# Patient Record
Sex: Female | Born: 1965 | Race: Black or African American | Hispanic: No | Marital: Married | Smoking: Never smoker
Health system: Southern US, Community
[De-identification: ages and names within clinical notes are randomized; demographics above are authoritative.]

## PROBLEM LIST (undated history)

## (undated) DIAGNOSIS — Z87442 Personal history of urinary calculi: Secondary | ICD-10-CM

## (undated) DIAGNOSIS — R4589 Other symptoms and signs involving emotional state: Secondary | ICD-10-CM

## (undated) DIAGNOSIS — Z7189 Other specified counseling: Secondary | ICD-10-CM

## (undated) DIAGNOSIS — G5603 Carpal tunnel syndrome, bilateral upper limbs: Secondary | ICD-10-CM

## (undated) DIAGNOSIS — Z780 Asymptomatic menopausal state: Secondary | ICD-10-CM

## (undated) DIAGNOSIS — E669 Obesity, unspecified: Secondary | ICD-10-CM

## (undated) DIAGNOSIS — M545 Low back pain, unspecified: Secondary | ICD-10-CM

## (undated) DIAGNOSIS — G8929 Other chronic pain: Secondary | ICD-10-CM

## (undated) DIAGNOSIS — K429 Umbilical hernia without obstruction or gangrene: Secondary | ICD-10-CM

## (undated) DIAGNOSIS — F418 Other specified anxiety disorders: Secondary | ICD-10-CM

## (undated) DIAGNOSIS — R232 Flushing: Secondary | ICD-10-CM

## (undated) DIAGNOSIS — I10 Essential (primary) hypertension: Secondary | ICD-10-CM

## (undated) DIAGNOSIS — IMO0002 Reserved for concepts with insufficient information to code with codable children: Secondary | ICD-10-CM

## (undated) HISTORY — DX: Carpal tunnel syndrome, bilateral upper limbs: G56.03

## (undated) HISTORY — DX: Other symptoms and signs involving emotional state: R45.89

## (undated) HISTORY — DX: Reserved for concepts with insufficient information to code with codable children: IMO0002

## (undated) HISTORY — DX: Low back pain: M54.5

## (undated) HISTORY — PX: OTHER SURGICAL HISTORY: SHX169

## (undated) HISTORY — DX: Flushing: R23.2

## (undated) HISTORY — DX: Other specified anxiety disorders: F41.8

## (undated) HISTORY — PX: LUMBAR SPINE SURGERY: SHX701

## (undated) HISTORY — DX: Asymptomatic menopausal state: Z78.0

## (undated) HISTORY — DX: Other specified counseling: Z71.89

## (undated) HISTORY — PX: PARTIAL HYSTERECTOMY: SHX80

## (undated) HISTORY — DX: Umbilical hernia without obstruction or gangrene: K42.9

## (undated) HISTORY — DX: Obesity, unspecified: E66.9

## (undated) HISTORY — DX: Low back pain, unspecified: M54.50

## (undated) HISTORY — PX: ABDOMINAL HYSTERECTOMY: SHX81

## (undated) HISTORY — PX: KNEE ARTHROSCOPY: SUR90

## (undated) HISTORY — PX: BACK SURGERY: SHX140

## (undated) HISTORY — DX: Other chronic pain: G89.29

---

## 2001-01-04 ENCOUNTER — Emergency Department (HOSPITAL_COMMUNITY): Admission: EM | Admit: 2001-01-04 | Discharge: 2001-01-04 | Payer: Self-pay | Admitting: Internal Medicine

## 2001-01-04 ENCOUNTER — Encounter: Payer: Self-pay | Admitting: Internal Medicine

## 2001-03-11 ENCOUNTER — Ambulatory Visit (HOSPITAL_COMMUNITY): Admission: RE | Admit: 2001-03-11 | Discharge: 2001-03-11 | Payer: Self-pay | Admitting: Internal Medicine

## 2001-03-11 ENCOUNTER — Encounter: Payer: Self-pay | Admitting: Internal Medicine

## 2001-04-16 ENCOUNTER — Emergency Department (HOSPITAL_COMMUNITY): Admission: EM | Admit: 2001-04-16 | Discharge: 2001-04-16 | Payer: Self-pay | Admitting: Internal Medicine

## 2001-04-16 ENCOUNTER — Encounter: Payer: Self-pay | Admitting: Internal Medicine

## 2001-04-18 ENCOUNTER — Other Ambulatory Visit: Admission: RE | Admit: 2001-04-18 | Discharge: 2001-04-18 | Payer: Self-pay | Admitting: Obstetrics and Gynecology

## 2001-05-01 ENCOUNTER — Inpatient Hospital Stay (HOSPITAL_COMMUNITY): Admission: RE | Admit: 2001-05-01 | Discharge: 2001-05-03 | Payer: Self-pay | Admitting: Obstetrics & Gynecology

## 2002-06-07 ENCOUNTER — Emergency Department (HOSPITAL_COMMUNITY): Admission: EM | Admit: 2002-06-07 | Discharge: 2002-06-07 | Payer: Self-pay | Admitting: Emergency Medicine

## 2002-06-07 ENCOUNTER — Encounter: Payer: Self-pay | Admitting: Emergency Medicine

## 2002-06-08 ENCOUNTER — Ambulatory Visit (HOSPITAL_COMMUNITY): Admission: RE | Admit: 2002-06-08 | Discharge: 2002-06-08 | Payer: Self-pay | Admitting: Internal Medicine

## 2002-06-08 ENCOUNTER — Encounter: Payer: Self-pay | Admitting: Internal Medicine

## 2002-09-08 ENCOUNTER — Encounter: Payer: Self-pay | Admitting: *Deleted

## 2002-09-08 ENCOUNTER — Emergency Department (HOSPITAL_COMMUNITY): Admission: EM | Admit: 2002-09-08 | Discharge: 2002-09-08 | Payer: Self-pay | Admitting: *Deleted

## 2004-07-15 ENCOUNTER — Emergency Department (HOSPITAL_COMMUNITY): Admission: EM | Admit: 2004-07-15 | Discharge: 2004-07-15 | Payer: Self-pay | Admitting: Emergency Medicine

## 2005-02-01 ENCOUNTER — Emergency Department (HOSPITAL_COMMUNITY): Admission: EM | Admit: 2005-02-01 | Discharge: 2005-02-01 | Payer: Self-pay | Admitting: Emergency Medicine

## 2005-03-19 ENCOUNTER — Ambulatory Visit: Payer: Self-pay | Admitting: Orthopedic Surgery

## 2005-05-11 ENCOUNTER — Emergency Department (HOSPITAL_COMMUNITY): Admission: EM | Admit: 2005-05-11 | Discharge: 2005-05-11 | Payer: Self-pay | Admitting: *Deleted

## 2005-05-14 ENCOUNTER — Ambulatory Visit: Payer: Self-pay | Admitting: Orthopedic Surgery

## 2005-05-18 ENCOUNTER — Ambulatory Visit (HOSPITAL_COMMUNITY): Admission: RE | Admit: 2005-05-18 | Discharge: 2005-05-18 | Payer: Self-pay | Admitting: Orthopedic Surgery

## 2005-06-11 ENCOUNTER — Ambulatory Visit: Payer: Self-pay | Admitting: Orthopedic Surgery

## 2005-07-30 ENCOUNTER — Emergency Department (HOSPITAL_COMMUNITY): Admission: EM | Admit: 2005-07-30 | Discharge: 2005-07-31 | Payer: Self-pay | Admitting: Emergency Medicine

## 2005-07-31 ENCOUNTER — Ambulatory Visit (HOSPITAL_COMMUNITY): Admission: RE | Admit: 2005-07-31 | Discharge: 2005-07-31 | Payer: Self-pay | Admitting: Emergency Medicine

## 2005-08-06 ENCOUNTER — Ambulatory Visit: Payer: Self-pay | Admitting: Orthopedic Surgery

## 2005-08-14 ENCOUNTER — Ambulatory Visit: Payer: Self-pay | Admitting: Orthopedic Surgery

## 2005-08-14 ENCOUNTER — Ambulatory Visit (HOSPITAL_COMMUNITY): Admission: RE | Admit: 2005-08-14 | Discharge: 2005-08-14 | Payer: Self-pay | Admitting: Orthopedic Surgery

## 2005-08-17 ENCOUNTER — Encounter (HOSPITAL_COMMUNITY): Admission: RE | Admit: 2005-08-17 | Discharge: 2005-09-16 | Payer: Self-pay | Admitting: Orthopedic Surgery

## 2005-08-20 ENCOUNTER — Ambulatory Visit: Payer: Self-pay | Admitting: Orthopedic Surgery

## 2005-09-10 ENCOUNTER — Ambulatory Visit: Payer: Self-pay | Admitting: Orthopedic Surgery

## 2005-09-17 ENCOUNTER — Encounter (HOSPITAL_COMMUNITY): Admission: RE | Admit: 2005-09-17 | Discharge: 2005-10-17 | Payer: Self-pay | Admitting: Orthopedic Surgery

## 2005-10-18 ENCOUNTER — Ambulatory Visit: Payer: Self-pay | Admitting: Orthopedic Surgery

## 2005-11-19 ENCOUNTER — Ambulatory Visit: Payer: Self-pay | Admitting: Orthopedic Surgery

## 2005-12-06 ENCOUNTER — Emergency Department (HOSPITAL_COMMUNITY): Admission: EM | Admit: 2005-12-06 | Discharge: 2005-12-06 | Payer: Self-pay | Admitting: Emergency Medicine

## 2006-02-11 ENCOUNTER — Ambulatory Visit: Payer: Self-pay | Admitting: Orthopedic Surgery

## 2006-04-07 ENCOUNTER — Emergency Department (HOSPITAL_COMMUNITY): Admission: EM | Admit: 2006-04-07 | Discharge: 2006-04-07 | Payer: Self-pay | Admitting: Emergency Medicine

## 2006-04-10 ENCOUNTER — Emergency Department (HOSPITAL_COMMUNITY): Admission: EM | Admit: 2006-04-10 | Discharge: 2006-04-10 | Payer: Self-pay | Admitting: Emergency Medicine

## 2006-06-10 ENCOUNTER — Encounter (INDEPENDENT_AMBULATORY_CARE_PROVIDER_SITE_OTHER): Payer: Self-pay | Admitting: Family Medicine

## 2006-06-10 ENCOUNTER — Ambulatory Visit (HOSPITAL_COMMUNITY): Admission: RE | Admit: 2006-06-10 | Discharge: 2006-06-10 | Payer: Self-pay | Admitting: Obstetrics and Gynecology

## 2006-08-13 ENCOUNTER — Ambulatory Visit: Payer: Self-pay | Admitting: Orthopedic Surgery

## 2006-09-20 ENCOUNTER — Observation Stay (HOSPITAL_COMMUNITY): Admission: EM | Admit: 2006-09-20 | Discharge: 2006-09-21 | Payer: Self-pay | Admitting: Emergency Medicine

## 2006-11-12 ENCOUNTER — Ambulatory Visit: Payer: Self-pay | Admitting: Family Medicine

## 2006-11-12 DIAGNOSIS — M545 Low back pain, unspecified: Secondary | ICD-10-CM | POA: Insufficient documentation

## 2006-11-12 DIAGNOSIS — E663 Overweight: Secondary | ICD-10-CM | POA: Insufficient documentation

## 2006-11-12 DIAGNOSIS — K5900 Constipation, unspecified: Secondary | ICD-10-CM | POA: Insufficient documentation

## 2006-11-12 LAB — CONVERTED CEMR LAB: Pap Smear: NORMAL

## 2006-11-19 ENCOUNTER — Encounter (INDEPENDENT_AMBULATORY_CARE_PROVIDER_SITE_OTHER): Payer: Self-pay | Admitting: Family Medicine

## 2006-11-20 ENCOUNTER — Telehealth (INDEPENDENT_AMBULATORY_CARE_PROVIDER_SITE_OTHER): Payer: Self-pay | Admitting: *Deleted

## 2006-11-20 LAB — CONVERTED CEMR LAB
Albumin: 4.4 g/dL (ref 3.5–5.2)
BUN: 15 mg/dL (ref 6–23)
Basophils Absolute: 0 10*3/uL (ref 0.0–0.1)
Basophils Relative: 1 % (ref 0–1)
Calcium: 9.3 mg/dL (ref 8.4–10.5)
Cholesterol: 137 mg/dL (ref 0–200)
Creatinine, Ser: 0.74 mg/dL (ref 0.40–1.20)
Eosinophils Absolute: 0.2 10*3/uL (ref 0.0–0.7)
Hemoglobin: 13.4 g/dL (ref 12.0–15.0)
LDL Cholesterol: 73 mg/dL (ref 0–99)
Lymphs Abs: 2.7 10*3/uL (ref 0.7–3.3)
MCV: 91.2 fL (ref 78.0–100.0)
Monocytes Absolute: 0.4 10*3/uL (ref 0.2–0.7)
Monocytes Relative: 7 % (ref 3–11)
Potassium: 4.2 meq/L (ref 3.5–5.3)
RBC: 4.41 M/uL (ref 3.87–5.11)
RDW: 12.5 % (ref 11.5–14.0)
TSH: 1.736 microintl units/mL (ref 0.350–5.50)
Total CHOL/HDL Ratio: 3
WBC: 5.1 10*3/uL (ref 4.0–10.5)

## 2006-11-21 ENCOUNTER — Encounter (INDEPENDENT_AMBULATORY_CARE_PROVIDER_SITE_OTHER): Payer: Self-pay | Admitting: Family Medicine

## 2006-12-27 ENCOUNTER — Ambulatory Visit: Payer: Self-pay | Admitting: Family Medicine

## 2006-12-27 DIAGNOSIS — M25519 Pain in unspecified shoulder: Secondary | ICD-10-CM | POA: Insufficient documentation

## 2006-12-27 DIAGNOSIS — F341 Dysthymic disorder: Secondary | ICD-10-CM | POA: Insufficient documentation

## 2007-01-03 ENCOUNTER — Telehealth (INDEPENDENT_AMBULATORY_CARE_PROVIDER_SITE_OTHER): Payer: Self-pay | Admitting: *Deleted

## 2007-05-23 ENCOUNTER — Ambulatory Visit: Payer: Self-pay | Admitting: Family Medicine

## 2007-05-27 ENCOUNTER — Telehealth (INDEPENDENT_AMBULATORY_CARE_PROVIDER_SITE_OTHER): Payer: Self-pay | Admitting: *Deleted

## 2007-06-02 ENCOUNTER — Ambulatory Visit (HOSPITAL_COMMUNITY): Admission: RE | Admit: 2007-06-02 | Discharge: 2007-06-02 | Payer: Self-pay | Admitting: Family Medicine

## 2007-06-09 ENCOUNTER — Ambulatory Visit: Payer: Self-pay | Admitting: Family Medicine

## 2007-07-22 ENCOUNTER — Ambulatory Visit: Payer: Self-pay | Admitting: Family Medicine

## 2007-07-22 ENCOUNTER — Ambulatory Visit (HOSPITAL_COMMUNITY): Admission: RE | Admit: 2007-07-22 | Discharge: 2007-07-22 | Payer: Self-pay | Admitting: Family Medicine

## 2007-07-22 DIAGNOSIS — M79609 Pain in unspecified limb: Secondary | ICD-10-CM | POA: Insufficient documentation

## 2007-07-23 ENCOUNTER — Telehealth (INDEPENDENT_AMBULATORY_CARE_PROVIDER_SITE_OTHER): Payer: Self-pay | Admitting: *Deleted

## 2007-08-28 ENCOUNTER — Encounter: Payer: Self-pay | Admitting: Orthopedic Surgery

## 2007-09-17 ENCOUNTER — Ambulatory Visit (HOSPITAL_COMMUNITY): Admission: RE | Admit: 2007-09-17 | Discharge: 2007-09-17 | Payer: Self-pay | Admitting: Obstetrics and Gynecology

## 2007-09-26 ENCOUNTER — Ambulatory Visit: Payer: Self-pay | Admitting: Family Medicine

## 2007-09-26 LAB — CONVERTED CEMR LAB
Beta hcg, urine, semiquantitative: NEGATIVE
Blood in Urine, dipstick: NEGATIVE
Glucose, Urine, Semiquant: NEGATIVE
Ketones, urine, test strip: NEGATIVE
Nitrite: NEGATIVE
Protein, U semiquant: 30
Specific Gravity, Urine: >=1.03

## 2007-09-30 ENCOUNTER — Encounter (INDEPENDENT_AMBULATORY_CARE_PROVIDER_SITE_OTHER): Payer: Self-pay | Admitting: Family Medicine

## 2007-10-02 ENCOUNTER — Emergency Department (HOSPITAL_COMMUNITY): Admission: EM | Admit: 2007-10-02 | Discharge: 2007-10-02 | Payer: Self-pay | Admitting: Emergency Medicine

## 2007-12-26 ENCOUNTER — Emergency Department (HOSPITAL_COMMUNITY): Admission: EM | Admit: 2007-12-26 | Discharge: 2007-12-26 | Payer: Self-pay | Admitting: Emergency Medicine

## 2008-01-01 ENCOUNTER — Encounter (INDEPENDENT_AMBULATORY_CARE_PROVIDER_SITE_OTHER): Payer: Self-pay | Admitting: Family Medicine

## 2008-01-03 ENCOUNTER — Emergency Department (HOSPITAL_COMMUNITY): Admission: EM | Admit: 2008-01-03 | Discharge: 2008-01-03 | Payer: Self-pay | Admitting: Emergency Medicine

## 2008-01-04 ENCOUNTER — Emergency Department (HOSPITAL_COMMUNITY): Admission: EM | Admit: 2008-01-04 | Discharge: 2008-01-04 | Payer: Self-pay | Admitting: Emergency Medicine

## 2008-01-05 ENCOUNTER — Encounter (INDEPENDENT_AMBULATORY_CARE_PROVIDER_SITE_OTHER): Payer: Self-pay | Admitting: Family Medicine

## 2008-01-12 ENCOUNTER — Encounter (INDEPENDENT_AMBULATORY_CARE_PROVIDER_SITE_OTHER): Payer: Self-pay | Admitting: Family Medicine

## 2008-03-29 ENCOUNTER — Emergency Department (HOSPITAL_COMMUNITY): Admission: EM | Admit: 2008-03-29 | Discharge: 2008-03-29 | Payer: Self-pay | Admitting: Emergency Medicine

## 2008-04-12 ENCOUNTER — Emergency Department (HOSPITAL_COMMUNITY): Admission: EM | Admit: 2008-04-12 | Discharge: 2008-04-12 | Payer: Self-pay | Admitting: Emergency Medicine

## 2008-04-12 ENCOUNTER — Telehealth (INDEPENDENT_AMBULATORY_CARE_PROVIDER_SITE_OTHER): Payer: Self-pay | Admitting: *Deleted

## 2008-04-22 ENCOUNTER — Encounter (HOSPITAL_COMMUNITY): Admission: RE | Admit: 2008-04-22 | Discharge: 2008-05-22 | Payer: Self-pay | Admitting: Family Medicine

## 2008-04-23 ENCOUNTER — Encounter (INDEPENDENT_AMBULATORY_CARE_PROVIDER_SITE_OTHER): Payer: Self-pay | Admitting: Family Medicine

## 2008-07-06 ENCOUNTER — Encounter (INDEPENDENT_AMBULATORY_CARE_PROVIDER_SITE_OTHER): Payer: Self-pay | Admitting: Family Medicine

## 2008-07-26 ENCOUNTER — Emergency Department (HOSPITAL_COMMUNITY): Admission: EM | Admit: 2008-07-26 | Discharge: 2008-07-26 | Payer: Self-pay | Admitting: Emergency Medicine

## 2009-04-29 ENCOUNTER — Ambulatory Visit (HOSPITAL_BASED_OUTPATIENT_CLINIC_OR_DEPARTMENT_OTHER): Admission: RE | Admit: 2009-04-29 | Discharge: 2009-04-29 | Payer: Self-pay | Admitting: Surgery

## 2009-05-17 ENCOUNTER — Emergency Department (HOSPITAL_COMMUNITY): Admission: EM | Admit: 2009-05-17 | Discharge: 2009-05-17 | Payer: Self-pay | Admitting: Emergency Medicine

## 2009-07-25 ENCOUNTER — Emergency Department (HOSPITAL_COMMUNITY): Admission: EM | Admit: 2009-07-25 | Discharge: 2009-07-25 | Payer: Self-pay | Admitting: Emergency Medicine

## 2009-09-26 ENCOUNTER — Ambulatory Visit (HOSPITAL_BASED_OUTPATIENT_CLINIC_OR_DEPARTMENT_OTHER): Admission: RE | Admit: 2009-09-26 | Discharge: 2009-09-26 | Payer: Self-pay | Admitting: Surgery

## 2009-12-19 ENCOUNTER — Ambulatory Visit: Payer: Self-pay | Admitting: Orthopedic Surgery

## 2009-12-19 DIAGNOSIS — M1710 Unilateral primary osteoarthritis, unspecified knee: Secondary | ICD-10-CM

## 2009-12-19 DIAGNOSIS — M25569 Pain in unspecified knee: Secondary | ICD-10-CM | POA: Insufficient documentation

## 2009-12-19 DIAGNOSIS — IMO0002 Reserved for concepts with insufficient information to code with codable children: Secondary | ICD-10-CM | POA: Insufficient documentation

## 2009-12-23 ENCOUNTER — Encounter: Payer: Self-pay | Admitting: Orthopedic Surgery

## 2010-02-01 ENCOUNTER — Ambulatory Visit: Payer: Self-pay | Admitting: Orthopedic Surgery

## 2010-02-24 ENCOUNTER — Ambulatory Visit (HOSPITAL_COMMUNITY)
Admission: RE | Admit: 2010-02-24 | Discharge: 2010-02-24 | Payer: Self-pay | Source: Home / Self Care | Attending: Family Medicine | Admitting: Family Medicine

## 2010-03-18 ENCOUNTER — Encounter: Payer: Self-pay | Admitting: Obstetrics and Gynecology

## 2010-03-20 ENCOUNTER — Encounter: Payer: Self-pay | Admitting: Family Medicine

## 2010-03-28 NOTE — Assessment & Plan Note (Signed)
Summary: left knee pain surg by Dr Rexene Edison in past needs [email or web address]   Vital Signs:  Patient profile:   45 year old female Height:      65 inches Weight:      203 pounds Pulse rate:   76 / minute Resp:     16 per minute  Vitals Entered By: Fuller Canada MD (December 19, 2009 4:14 PM)  Visit Type:  new problem Referring Provider:  self Primary Provider:  Franchot Heidelberg, MD  CC:  bilateral knee pain.  History of Present Illness: I saw Erica Graves in the office today for a followup visit.  She is a 45 years old woman with the complaint of:  bilateral knee pain.  This 45 year old female had an arthroscopy back in 2007 on her LEFT knee and did well until about 3 months ago when she started having locking after sitting which is mainly stiffness after sitting associated with swelling and stiffness.  She denies any injury.  She has some sharp throbbing quite severe constant pain which came on gradually in the front of both of her knees.  She does trouble squatting climbing stairs and getting out of a chair   Meds: none    Allergies (verified): No Known Drug Allergies  Past History:  Past Surgical History: Left knee surgery 08/14/2005 - torn cartilage - Dr. 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 Cardiovascular:  Denies chest pain, palpitations, fainting, and murmurs. Respiratory:  Denies short of breath, wheezing, couch, tightness, pain on inspiration, and snoring . Gastrointestinal:  Denies heartburn, nausea, vomiting, diarrhea, constipation, and blood in your stools. Genitourinary:  Denies frequency, urgency, difficulty urinating, painful urination, flank pain, and bleeding in urine. Neurologic:  Denies numbness, tingling,  unsteady gait, dizziness, tremors, and seizure. Musculoskeletal:  Complains of swelling and stiffness; denies joint pain, instability, redness, heat, and muscle pain. Endocrine:  Denies excessive thirst, exessive urination, and heat or cold intolerance. Psychiatric:  Denies nervousness, depression, anxiety, and hallucinations. Skin:  Denies changes in the skin, poor healing, rash, itching, and redness. HEENT:  Denies blurred or double vision, eye pain, redness, and watering. Immunology:  Denies seasonal allergies, sinus problems, and allergic to bee stings. Hemoatologic:  Denies easy bleeding and brusing.  Physical Exam  Additional Exam:  Vital signs are 203 pounds height 5 ft 5 respiratory rate 16  General appearance is normal  She's going to x3  Mood and affect are normal  Her gait and station are normal  Her upper extremities have no contracture, subluxation, atrophy or tremor and no alignment abnormality is and the skin is intact with normal pulse and temperature  The lower extremities have tenderness in the front of the knee and the patellar tendon and quadriceps tendon insertion with very minimal medial joint line tenderness, no effusion.  She has full range of motion.  All ligaments were stable and intact.  Strength and muscle tone normal skin normal pulses and temperature normal with no edema  Sensory exam all 4 extremities normal reflexes all 4 extremities normal coordination was normal lymph nodes all 4 extremities negative     Impression & Recommendations:  Problem # 1:  PATELLO-FEMORAL SYNDROME (ICD-719.46) Assessment New  Orders: New Patient Level IV (16109) Knees  x-ray bilateral ([id number])  Problem # 2:  DEGENERATIVE  JOINT DISEASE, KNEES, BILATERAL (ICD-715.96) Assessment: New  AP and lateral and tangential x-rays are obtained of both knees  There are subtle changes of mild osteoarthritis.  Impression mild osteoarthritis  Assessment the patient has  anterior knee pain with symptoms related to patellofemoral disease.  Recommend exercise program and diclofenac 50 mg twice a day and a followup appointment to assess the effectiveness of this treatment.  Orders: New Patient Level IV (16109) Knees  x-ray bilateral ([id number])  Patient Instructions: 1)  You have received an injection of cortisone today. You may experience increased pain at the injection site. Apply ice pack to the area for 20 minutes every 2 hours and take 2 xtra strength tylenol every 8 hours. This increased pain will usually resolve in 24 hours. The injection will take effect in 3-10 days.  2)  start diclofenac 50 mg two times a day  3)  home exercises  4)  Please schedule a follow-up appointment in 1 month.   Orders Added: 1)  New Patient Level IV [99204] 2)  Knees  x-ray bilateral [[id number]]

## 2010-03-28 NOTE — Medication Information (Signed)
Summary: Tax adviser   Imported By: Cammie Sickle 12/20/2009 18:48:43  _____________________________________________________________________  External Attachment:    Type:   Image     Comment:   External Document

## 2010-03-28 NOTE — Assessment & Plan Note (Signed)
Summary: 1 M RE-CK LT KNEE/BCBS/CAF   Visit Type:  Follow-up Referring Provider:  self Primary Provider:  Franchot Heidelberg, MD  CC:  recheck left knee.  History of Present Illness: I saw Erica Graves in the office today for a followup visit.  She is a 45 years old woman with the complaint of:  bilateral knee pain.  This 45 year old female had an arthroscopy back in 2007 on her LEFT knee and did well until about 3 months ago when she started having locking after sitting which is mainly stiffness after sitting associated with swelling and stiffness.  She denies any injury.  She has some sharp throbbing quite severe constant pain which came on gradually in the front of both of her knees.  She does trouble squatting climbing stairs and getting out of a chair  Meds: Diclofenac.  Today is one month recheck after Diclofenac 50mg  bid, injections and HEP for bilateral knee OA.  Doing better.  Her pain has decreased, although she is still symptomatic.  ROS DENIES LOCKING      Allergies (verified): No Known Drug Allergies  Physical Exam  Additional Exam:  bilateral knee examination no swelling today in either knee. No tenderness. Mild crepitance full range of motion. Both knees very stable to anterior-posterior stress testing, as well as collateral ligaments.   Impression & Recommendations:  Problem # 1:  DEGENERATIVE JOINT DISEASE, KNEES, BILATERAL (ICD-715.96) Assessment Improved  Her updated medication list for this problem includes:    Flexeril 10 Mg Tabs (Cyclobenzaprine hcl) ..... One three times a day as needed for low back pain. please be aware of sedation risk.    Ultram 50 Mg Tabs (Tramadol hcl) ..... One every 6 hours as needed    Diclofenac Sodium 50 Mg Tbec (Diclofenac sodium) .Marland Kitchen... 1 by mouth bid  Orders: Post-Op Check (16109) Est. Patient Level III (60454) continue exercises for one more month and continue diclofenac for the foreseeable future.  She has been  approved to [facility] but as long as she is smart about it. In terms of stressful exercises that would bother her knee  Medications Added to Medication List This Visit: 1)  Diclofenac Sodium 50 Mg Tbec (Diclofenac sodium) .Marland Kitchen.. 1 by mouth bid  Patient Instructions: 1)  Do exercices for another month 2)  Continue medication 3)  come back in 6 months Prescriptions: DICLOFENAC SODIUM 50 MG TBEC (DICLOFENAC SODIUM) 1 by mouth bid  #60 x 2   Entered and Authorized by:   Fuller Canada MD   Signed by:   Fuller Canada MD on 02/01/2010   Method used:   Faxed to ...       [facility] [address] [phone]* (retail)       [address] [place], [place]  [address]       Ph: [phone]       Fax: [phone]   RxID:   [id number]    Orders Added: 1)  Post-Op Check [99024] 2)  Est. Patient Level III [66440]

## 2010-03-28 NOTE — Letter (Signed)
Summary: History form  History form   Imported By: Jacklynn Ganong 12/23/2009 09:57:11  _____________________________________________________________________  External Attachment:    Type:   Image     Comment:   External Document

## 2010-05-22 LAB — POCT HEMOGLOBIN-HEMACUE: Hemoglobin: 13.5 g/dL (ref 12.0–15.0)

## 2010-06-06 LAB — URINALYSIS, ROUTINE W REFLEX MICROSCOPIC
Glucose, UA: 250 mg/dL — AB
Hgb urine dipstick: NEGATIVE
Nitrite: POSITIVE — AB
Protein, ur: 100 mg/dL — AB
Specific Gravity, Urine: 1.025 (ref 1.005–1.030)
Urobilinogen, UA: >8 mg/dL — ABNORMAL HIGH (ref 0.0–1.0)
pH: 5 (ref 5.0–8.0)

## 2010-06-06 LAB — URINE CULTURE

## 2010-06-06 LAB — URINE MICROSCOPIC-ADD ON

## 2010-06-13 LAB — URINALYSIS, ROUTINE W REFLEX MICROSCOPIC
Bilirubin Urine: NEGATIVE
Glucose, UA: NEGATIVE mg/dL
Hgb urine dipstick: NEGATIVE
Protein, ur: NEGATIVE mg/dL
pH: 5.5 (ref 5.0–8.0)

## 2010-06-14 ENCOUNTER — Emergency Department (HOSPITAL_COMMUNITY): Payer: BC Managed Care – PPO

## 2010-06-14 ENCOUNTER — Emergency Department (HOSPITAL_COMMUNITY)
Admission: EM | Admit: 2010-06-14 | Discharge: 2010-06-14 | Disposition: A | Discharge status: Home / Self Care | Payer: BC Managed Care – PPO | Attending: Emergency Medicine | Admitting: Emergency Medicine

## 2010-06-14 DIAGNOSIS — M25569 Pain in unspecified knee: Secondary | ICD-10-CM | POA: Insufficient documentation

## 2010-06-14 DIAGNOSIS — Z87442 Personal history of urinary calculi: Secondary | ICD-10-CM | POA: Insufficient documentation

## 2010-06-14 DIAGNOSIS — M1710 Unilateral primary osteoarthritis, unspecified knee: Secondary | ICD-10-CM | POA: Insufficient documentation

## 2010-07-07 ENCOUNTER — Other Ambulatory Visit: Payer: Self-pay | Admitting: Family Medicine

## 2010-07-07 DIAGNOSIS — Z139 Encounter for screening, unspecified: Secondary | ICD-10-CM

## 2010-07-10 ENCOUNTER — Ambulatory Visit (HOSPITAL_COMMUNITY)
Admission: RE | Admit: 2010-07-10 | Discharge: 2010-07-10 | Disposition: A | Discharge status: Home / Self Care | Payer: BC Managed Care – PPO | Source: Ambulatory Visit | Attending: Family Medicine | Admitting: Family Medicine

## 2010-07-10 DIAGNOSIS — Z139 Encounter for screening, unspecified: Secondary | ICD-10-CM

## 2010-07-10 DIAGNOSIS — Z1231 Encounter for screening mammogram for malignant neoplasm of breast: Secondary | ICD-10-CM | POA: Insufficient documentation

## 2010-07-11 NOTE — H&P (Signed)
Erica Graves, SCHUELKE NO.:  [id number]   MEDICAL RECORD NO.:  [id number]          PATIENT TYPE:  OBV   LOCATION:  A339                          FACILITY:  [facility]   PHYSICIAN:  Dennie Maizes, M.D.   DATE OF BIRTH:  11/05/1965   DATE OF ADMISSION:  09/20/2006  DATE OF DISCHARGE:  LH                              HISTORY & PHYSICAL   CHIEF COMPLAINT:  Severe left flank pain and left lower quadrant  abdominal pain, nausea and vomiting.   HISTORY OF PRESENT ILLNESS:  This 45 year old female has a history of  recurrent urolithiasis.  She has had kidney stones about 6 years ago.  She passed the stone at that time.  She experienced onset of severe left  flank pain and left lower quadrant abdominal pain since this morning.  This was associated with nausea and vomiting.  She came to the emergency  room at [facility].  Urinalysis revealed microhematuria.  Noncontrast CT scan of abdomen and pelvis revealed a 3 mm size, left,  distal ureteral calculus located at ureterovesical junction with  obstruction and hydronephrosis.  The patient's pain was not adequately  relieved in the emergency room.  She has been admitted to hospital for  pain control and further treatment.   She denied having any fever, chills, voiding difficulty or gross  hematuria at present.   PAST MEDICAL HISTORY:  1. Recurring urolithiasis.  2. Status post hysterectomy.  3. Status post knee surgery.   MEDICATIONS:  None.   ALLERGIES:  No known drug allergies.   PHYSICAL EXAMINATION:  GENERAL:  The patient is comfortable after  receiving narcotics.  HEENT:  Normal.  NECK:  No masses.  LUNGS:  Clear to auscultation.  HEART:  Regular rate and rhythm.  No murmurs.  ABDOMEN:  Soft, no palpable flank mass.  Mild left costovertebral angle  as well as left lower quadrant abdominal tenderness is noted.  Bladder  not palpable.   LABORATORY DATA AND X-RAY FINDINGS:  Urinalysis with blood moderate,  nitrate negative, leukocyte esterase negative, rbc's 7-10 per high-power  field.   IMPRESSION:  1. Left distal ureteral calculus with obstruction (3 mm).  2. Left renal colic.  3. Left hydronephrosis.   PLAN:  1. Admit the patient to the hospital for observation.  2. IV fluids.  3. Narcotics.  4. Discussed with the patient regarding management options.  5. Check x-ray with KUB of area in a.m. for stone migration.      Dennie Maizes, M.D.  Electronically Signed     SK/MEDQ  D:  09/20/2006  T:  09/20/2006  Job:  [id number]

## 2010-07-12 ENCOUNTER — Other Ambulatory Visit: Payer: Self-pay | Admitting: Family Medicine

## 2010-07-12 DIAGNOSIS — R928 Other abnormal and inconclusive findings on diagnostic imaging of breast: Secondary | ICD-10-CM

## 2010-07-13 ENCOUNTER — Encounter: Payer: Self-pay | Admitting: Orthopedic Surgery

## 2010-07-13 ENCOUNTER — Ambulatory Visit (INDEPENDENT_AMBULATORY_CARE_PROVIDER_SITE_OTHER): Payer: BC Managed Care – PPO | Admitting: Orthopedic Surgery

## 2010-07-13 DIAGNOSIS — M1710 Unilateral primary osteoarthritis, unspecified knee: Secondary | ICD-10-CM

## 2010-07-13 DIAGNOSIS — M79605 Pain in left leg: Secondary | ICD-10-CM

## 2010-07-13 DIAGNOSIS — IMO0002 Reserved for concepts with insufficient information to code with codable children: Secondary | ICD-10-CM

## 2010-07-13 DIAGNOSIS — M545 Low back pain, unspecified: Secondary | ICD-10-CM

## 2010-07-13 DIAGNOSIS — M13169 Monoarthritis, not elsewhere classified, unspecified knee: Secondary | ICD-10-CM

## 2010-07-13 MED ORDER — METHYLPREDNISOLONE ACETATE 40 MG/ML IJ SUSP: 40.0000 mg | Freq: Once | INTRAMUSCULAR | Status: DC

## 2010-07-13 MED ORDER — PREDNISONE (PAK) 10 MG PO TABS: ORAL_TABLET | ORAL | Status: DC

## 2010-07-13 NOTE — Progress Notes (Signed)
Chief complaint LEFT knee pain.  45 yo  female, status post arthroscopy, LEFT knee did well up until the last few weeks. When she started having pain in her LEFT knee associated with tightness difficulty getting out of a chair. She does have some lower back pain as well, but this does not seem to be the major problem at this time.  No numbness no tingling. No catching or locking but swelling is noted in the LEFT knee.  Exam shows well-developed, well-nourished, female, grooming, and hygiene, are normal.  She has tenderness in the lower back, LEFT gluteal region. Does a straight leg raise test at 45 with increased pain with Lasegue's maneuver.  The RIGHT knee is painful to range of motion tender in the mediolateral joint lines. There is no joint effusion. He is stable. McMurray sign was negative.  RIGHT lower extremity. Straight leg raise was normal. Knee range of motion was full and normal without pain.  Impression #1 LEFT knee pain. X-rays show arthritis. This was done at the hospital. Exam consistent with inflammation in the LEFT knee.  Impression #2 lower back pain with LEFT leg radiation.  Plan inject knee for inflammation. #2 Dosepak for 12 days.   Followup as needed

## 2010-07-14 NOTE — H&P (Signed)
NAME:  Erica, Graves NO.:  [id number]   MEDICAL RECORD NO.:  [id number]          PATIENT TYPE:  AMB   LOCATION:  DAY                           FACILITY:  [facility]   PHYSICIAN:  Vickki Hearing, M.D.DATE OF BIRTH:  1965/06/07   DATE OF ADMISSION:  DATE OF DISCHARGE:  LH                                HISTORY & PHYSICAL   SURGERY:  Left knee arthroscopy.   HISTORY:  Erica Graves is a 45 year old female with a long history of  bilateral knee pain.  She was treated back in January 2007 for  patellofemoral symptoms and actually got better with anti-inflammatories and  exercise; however, she started having pain and swelling after playing  softball this spring.  She could not put any pressure on the knee, had no  known injury.  She had an MRI on July 31, 2005 after presenting to the  emergency room for pain, and it showed a torn medial and lateral meniscus  with cartilage thinning without full-thickness loss on the medial  compartment, full-thickness cartilage loss on the femoral side lateral  condyle.  She presents for surgery to alleviate her symptoms.  She is giving  informed consent.  Understands without surgery she will have continued  symptoms.   REVIEW OF SYSTEMS:  Full system review was done for ten systems.  Positive  findings none.   ALLERGIES:  NONE.   MEDICAL PROBLEMS:  None.   SURGERIES:  Hysterectomy.   FAMILY HISTORY:  Negative.   SOCIAL HISTORY:  She is married.  She drives a bus.  She works in [employer].  She does not drink, smoke or use caffeine.  She completed 14  total years of schooling, high school plus two.   EXAMINATION:  Examination of the left knee shows her range of motion is  restricted to 10 to 70 degrees with medial tenderness.  Ligaments are  stable.  Joint effusion is present.  Motor exam as could be tested was  normal.   The remaining exam shows stable vital signs.  APPEARANCE:  Normal with good grooming hygiene.  BODY HABITUS:  Mesomorphic.  CARDIOVASCULAR EXAM:  Normal pulse and perfusion.  No venous stasis, edema,  temperature changes.  LYMPH SYSTEM:  Normal.  MUSCULOSKELETAL:  Other than extremities, normal.  SKIN:  Normal in all four extremities.  She was awake, alert and oriented times three with normal mood.  Reflexes  and coordination were good.   Radiographs showed no major arthritic changes in the knee.  The MRI which  was done on July 31, 2005 at [facility] again shows medial and lateral tears  of the menisci with osteoarthritis.   DIAGNOSIS:  Medial and lateral meniscal tear with osteoarthritis.   PLAN:  Arthroscopy left knee, medially and lateral meniscectomies, judicious  use of chondroplasties.      Vickki Hearing, M.D.  Electronically Signed     SEH/MEDQ  D:  08/13/2005  T:  08/13/2005  Job:  [id number]   cc:   [facility]

## 2010-07-14 NOTE — H&P (Signed)
[facility]  Patient:    Erica Graves, Erica Graves Visit Number: [id number] MRN: [id number]          Service Type: MED Location: 3A A213 01 Attending Physician:  Lazaro Arms Dictated by:   Duane Lope, M.D. Admit Date:  05/01/2001                           History and Physical  DATE OF BIRTH:  [birth year]  DATE OF SURGERY:  May 01, 2001  HISTORY OF PRESENT ILLNESS:  The patient is a 45 year old African-American female gravida 3, para 3, with all vaginal deliveries.  She is admitted for an abdominal hysterectomy.  The patient was seen, originally, in the emergency room on February 19, complaining of pelvic pain.  She had a pelvic sonogram revealing 3 myomas, was given a narcotic pain medicine, antibiotics and evaluated in the office again on the 21st, over here at [facility].  She saw Cathie Beams, C.N.M. and the patient really was felt to have larger fibroids and continued to have quite a bit of pain.  The patient was seen by me a couple of days later and a pelvic reveals the uterine size approximately 10-12 weeks size and very tender fibroids on examination.  The adnexa are negative.  Her periods are also quite heavy, although they remain regular.  They have gotten somewhat heavier and takes quite a bit of cramps with her period.  She has been on Percocet and/or Tylox for her pain and she is unable to function at any other capacity.  As a result, she is admitted for definitive therapy.  PAST MEDICAL HISTORY:  Negative.  PAST SURGICAL HISTORY: 1. Laparoscopic tubal ligation. 2. Right axillary lesion removal.  PAST OBSTETRICAL HISTORY:  Three vaginal deliveries.  ALLERGIES:  None.  MEDICINES:  Lexapro 20 a day and her pain medicine.  REVIEW OF SYSTEMS:  As per the HPI, but all other system groups are negative.  FAMILY HISTORY: Significant for hypertension, cardiovascular disease and diabetes.  Otherwise negative.  PHYSICAL  EXAMINATION:  VITAL SIGNS:  Height 5 feet 6 inches, weight is 183 pounds.  Blood pressure is 120/80.  HEENT:  Unremarkable.  NECK:  Normal thyroid.  LUNGS:  Clear.  HEART:  Regular rate and rhythm without murmur, regurgitation, or gallop.  BREASTS:  Without mass or discharge.  No skin changes.  No axillary adenopathy.  No nipple changes.  ABDOMEN:  Tender in the midline lower quadrant.  No rebound, no guarding. There is no hepatosplenomegaly.  No masses palpable and otherwise nontender.  GENITOURINARY:  She has normal external genitalia.  Vagina is pink without discharge.  Cervix is parous without lesions.  Uterus is tender to palpation. It is approximately 10 weeks size, and quite tender.  The adnexa is negative and nontender.  EXTREMITIES:  Warm, no edema.  NEUROLOGICAL:  Exam is grossly intact.  IMPRESSION: 1. Enlarge fibroid uterus. 2. Pelvic pain.  PLAN:  The patient is admitted for abdominal hysterectomy.  She understands the risks, benefits, indications, alternatives and we will proceed. Dictated by:   Duane Lope, M.D. Attending Physician:  Lazaro Arms DD:  04/30/01 TD:  04/30/01 Job: [id number]

## 2010-07-14 NOTE — Op Note (Signed)
NAME:  Erica Graves, Erica Graves NO.:  [id number]   MEDICAL RECORD NO.:  [id number]          PATIENT TYPE:  AMB   LOCATION:  DAY                           FACILITY:  [facility]   PHYSICIAN:  Vickki Hearing, M.D.DATE OF BIRTH:  03-21-1965   DATE OF PROCEDURE:  08/14/2005  DATE OF DISCHARGE:                                 OPERATIVE REPORT   INDICATIONS FOR THE PROCEDURE:  She is a 45 year old female with a long  history of bilateral knee pain and was treated in January 2007 for  patellofemoral pain syndrome, got better with anti-inflammatories and  exercises.  She injured herself and started having more pain and swelling  while playing softball this spring.  She presented to the emergency room  with no specific acute injury but just what appeared to be where and tear  like symptoms with pain and swelling on July 31, 2005. She had an MRI which  showed a torn medial and lateral meniscus with cartilage thinning, full-  thickness cartilage loss in the femoral side lateral femoral condyle.   PREOPERATIVE DIAGNOSIS:  Medial and lateral meniscal tear and osteoarthritis  of the left knee.   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   OPERATIVE FINDINGS:  Normal lateral and medial meniscus. Grade II and III  changes of the lateral femoral tibial plateau. Patellofemoral joint normal.  Medial compartment normal.  There was some synovial tissue from the notch  which was in front of the posterior edge of the medial meniscus which was  probably causing the meniscal signal changes on MRI to be consistent with  torn meniscus, however, probing and visual inspection of the meniscus showed  no tear.   SURGEON:  Vickki Hearing, M.D.   ASSISTANT:  None.   ANESTHESIA:  LMA general.   COUNTS:  Correct.   COMPLICATIONS:  None.   DESCRIPTION OF PROCEDURE:  The patient was seen in the preop holding  area,  identified as Erica Graves.  Her left knee was marked for surgery,  countersigned by the surgeon.  History and physical was updated.  She was  given Ancef and taken to the operating for LMA anesthetic.  She was placed  supine on the operating table.  Her knee was prepped and draped using  sterile technique.  A time-out was completed.   A lateral portal was established.  A viewing portal was used for viewing and  then diagnostic arthroscopy was done.  A medial portal was established for  probing.  Repeat diagnostic procedure was done with probing of all the  structures in the knee. Findings are noted as stated.  The medial portal was  used to insert a shaver into the knee and resect the synovium in front of  the lateral femoral condyle defect.  This is a grade 4 lesion approximately  15 by 5 mm.  Two microfractures were performed with a chondral pick and then  we confirmed the penetration of the subchondral bone by decreasing the water  and applying  suction.  Once we got a bleeding bone bed, we irrigated the  knee, debrided the free edge of the lateral meniscus which was not a  significant tear, and then closed with Steri-Strips and added sterile  dressings and Cryocuff.  She was extubated and taken to the recovery room in  stable condition.   She will have to be non-weight bearing for the first three weeks and then  progressive weight-bearing in a brace for another three weeks, active range  of motion can be started immediately.  Discharged with Lorcet Plus, #60 with  two refills, taken every 4 hours p.r.n. for pain and Phenergan 25 mg q.6h.,  #40.      Vickki Hearing, M.D.  Electronically Signed     SEH/MEDQ  D:  08/14/2005  T:  08/14/2005  Job:  [id number]

## 2010-07-14 NOTE — Op Note (Signed)
[facility]  Patient:    Erica Graves, Erica Graves Visit Number: [id number] MRN: [id number]          Service Type: SUR Location: 3A F621 01 Attending Physician:  Lazaro Arms Dictated by:   Duane Lope, M.D. Proc. Date: 05/01/01 Admit Date:  05/01/2001 Discharge Date: 05/03/2001                             Operative Report  PREOPERATIVE DIAGNOSES: 1. 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 2. Pelvic pain.  POSTOPERATIVE DIAGNOSES: 1. 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 2. Pelvic pain.  OPERATION:  Abdominal hysterectomy.  SURGEON:  Duane Lope, M.D.  ASSISTANT:  Christin Bach, M.D.  ANESTHESIA:  General endotracheal anesthesia.  FINDINGS:  The patient had a globular, enlarged, multiple myoma, fibroid uterus about 10-week size, arising out of the pelvis, filling the pelvis really.  It was sort of soft and mushy, consistent with a possible degenerative myoma.  The ovaries and tubes were normal bilaterally.  There were no other intraperitoneal abnormalities noted.  DESCRIPTION OF PROCEDURE:  The patient was taken to the operating room and placed in the supine position.  In the preop area, she had received 1 g Ancef prophylactically, and she had her Flowtrons in place and working at the time of induction of anesthesia.  She underwent general endotracheal anesthesia, was prepped and draped in the usual sterile fashion, and a Foley catheter was placed.  A Pfannenstiel skin incision was made and carried down sharply to the rectus fascia which was scored in the midline and extended laterally.  The fascia was taken off the muscles superiorly and inferiorly without difficulty.  The rectus muscles were divided, and the peritoneal cavity was entered.  A Balfour self-retaining retractor was placed, and the upper abdomen was packed away with three lap pads, and the patient was placed in Trendelenburg position. The uterus was noted and found to be  enlarged.  Kocher clamps were placed at the uterine cornu for uterine elevation out of the pelvis, and the left round ligament was sutured ligated and cut, and the vesicouterine serosal flap on the left was created.  The utero-ovarian ligament was then isolated and avascular window made, clamped, cut, and double suture ligated with good hemostasis.  The uterine vessels were then skeletonized on the left. The bladder was pushed further off the lower uterine segment, and the left uterine arteries were clamped, cut and suture ligated.  The right round ligament was then identified.  It was suture ligated and cut, and the vesicouterine serosal flap on the right was created.  The utero-ovarian ligament was identified and avascular window made.  It was clamped, cut, and double suture ligated with good hemostasis.  The uterine vessel on the right were skeletonized, clamped, cut, and suture ligated.  Serial pedicles were taken down the cervix, through the cardinal ligament, each being clamped, cut, and transfixion suture ligated.  The bladder was pushed well down off the lower uterine segment and cervix and was not in the region of the operative dissection.  The end of the cervix was encountered, and the vagina was crossclamped on the left and right. Each pedicle was cut, transfixion suture ligated, and  held.  The uterine specimen was then removed and sent off the table.  The vagina was closed with interrupted figure-of-eight sutures with good resulting hemostasis.  All suture use at this point was 0 Vicryl.  The pelvis was irrigated  vigorously. Some small peritoneal bleeders were seen, and electrocautery unit was used for hemostasis.  The utero-ovarian pedicle was tied to the round ligament bilaterally to get the ovaries away from the vagina; they were sagging down, and I was concerned about the ovaries being adherent in the future and causing dyspareunia.  I think they will still be able to be  palpated on exam.  The pelvis was once again vigorously irrigated, and all pedicles were found to be hemostatic.  The Balfour self-retaining retractor was removed.  The packs were removed, and all lap counts were correct at this point.  The muscles were reapproximated loosely using 3-0 Monocryl.  The fascia was closed using 0 Vicryl running.  The subcutaneous tissue was made hemostatic.  Four Alpha 200 pain catheters were placed, two at 4 cc an hour and two 2 cc an hour, and it was hooked to a pump with 200 cc of 0.25 bupivacaine to give a total of 12 cc an  hour or 30 mg an hour of local anesthetic.  The skin was reapproximated using skin staples and a bandage  place.  The patient was awakened from anesthesia and taken to the recovery room in good and stable condition.   All counts were correct.  She experienced 200 cc of blood loss and received 2000 cc of lactated Ringers intraoperatively.  She had good, clear urine output throughout the operative course. Dictated by:   Duane Lope, M.D. Attending Physician:  Lazaro Arms DD:  05/01/01 TD:  05/03/01 Job: [id number]

## 2010-07-14 NOTE — Discharge Summary (Signed)
[facility]  Patient:    Erica Graves, Erica Graves Visit Number: [id number] MRN: [id number]          Service Type: SUR Location: 3A [id number] Attending Physician:  Lazaro Arms Dictated by:   Duane Lope, M.D. Admit Date:  05/01/2001 Discharge Date: 05/03/2001                             Discharge Summary  DISCHARGE DIAGNOSES: 1. Status post abdominal hysterectomy. 2. Leiomyomata uteri. 3. Pelvic pain. 4. Unremarkable postoperative course.  PROCEDURE:  Abdominal hysterectomy.  Please refer to the transcribed history and physical and operative note for details of admission to the hospital.  HOSPITAL COURSE:  The patient was admitted after surgery, which went well. She had a hemoglobin and hematocrit on postoperative day #1 of 10.6 and 30.7 with a white count of 6900. The patient remained afebrile throughout her postoperative course. She had minimal pain requirement. She had an analgesic pump in place and received Buprenex, Tylox, and Toradol postop and really did quite well. She ambulated without difficulty, tolerated clear liquids, and then a regular diet. She voided without symptoms, was passing flatus, and having normal bowel function at time of discharge. Her incision was clean, dry and intact. The staples will be removed in the office on Wednesday schedule.  DISPOSITION:  She is discharged to home on the morning of postop day #2 in good stable condition. Follow up in the office on Wednesday. She is given Toradol 10 mg q.8h. and Tylox as needed for pain. If she has any other problems in the meantime, she will give [place] a call. Dictated by:   Duane Lope, M.D. Attending Physician:  Lazaro Arms DD:  05/03/01 TD:  05/05/01 Job: [id number]

## 2010-07-26 ENCOUNTER — Other Ambulatory Visit: Payer: Self-pay | Admitting: Family Medicine

## 2010-07-26 ENCOUNTER — Ambulatory Visit (HOSPITAL_COMMUNITY)
Admission: RE | Admit: 2010-07-26 | Discharge: 2010-07-26 | Disposition: A | Discharge status: Home / Self Care | Payer: BC Managed Care – PPO | Source: Ambulatory Visit | Attending: Family Medicine | Admitting: Family Medicine

## 2010-07-26 ENCOUNTER — Other Ambulatory Visit (HOSPITAL_COMMUNITY): Payer: Self-pay | Admitting: Family Medicine

## 2010-07-26 DIAGNOSIS — R928 Other abnormal and inconclusive findings on diagnostic imaging of breast: Secondary | ICD-10-CM

## 2010-10-17 ENCOUNTER — Ambulatory Visit: Payer: BC Managed Care – PPO | Admitting: Orthopedic Surgery

## 2010-11-06 ENCOUNTER — Other Ambulatory Visit (HOSPITAL_COMMUNITY): Payer: Self-pay | Admitting: Neurosurgery

## 2010-11-06 DIAGNOSIS — M545 Low back pain, unspecified: Secondary | ICD-10-CM

## 2010-11-06 DIAGNOSIS — M5416 Radiculopathy, lumbar region: Secondary | ICD-10-CM

## 2010-11-27 ENCOUNTER — Ambulatory Visit (HOSPITAL_COMMUNITY)
Admission: RE | Admit: 2010-11-27 | Discharge: 2010-11-27 | Disposition: A | Discharge status: Home / Self Care | Payer: BC Managed Care – PPO | Source: Ambulatory Visit | Attending: Neurosurgery | Admitting: Neurosurgery

## 2010-11-27 ENCOUNTER — Encounter (HOSPITAL_COMMUNITY): Payer: Self-pay

## 2010-11-27 DIAGNOSIS — M51379 Other intervertebral disc degeneration, lumbosacral region without mention of lumbar back pain or lower extremity pain: Secondary | ICD-10-CM | POA: Insufficient documentation

## 2010-11-27 DIAGNOSIS — M545 Low back pain, unspecified: Secondary | ICD-10-CM

## 2010-11-27 DIAGNOSIS — M5137 Other intervertebral disc degeneration, lumbosacral region: Secondary | ICD-10-CM | POA: Insufficient documentation

## 2010-11-27 DIAGNOSIS — M5416 Radiculopathy, lumbar region: Secondary | ICD-10-CM

## 2010-11-27 DIAGNOSIS — M79609 Pain in unspecified limb: Secondary | ICD-10-CM | POA: Insufficient documentation

## 2010-11-27 MED ORDER — IOHEXOL 180 MG/ML  SOLN
20.0000 mL | Freq: Once | INTRAMUSCULAR | Status: AC | PRN
  Administered 2010-11-27: 20 mL via INTRAVENOUS

## 2010-11-28 LAB — URINE MICROSCOPIC-ADD ON

## 2010-11-28 LAB — URINALYSIS, ROUTINE W REFLEX MICROSCOPIC
Bilirubin Urine: NEGATIVE
Glucose, UA: NEGATIVE
Nitrite: NEGATIVE
Nitrite: NEGATIVE
Protein, ur: NEGATIVE
Specific Gravity, Urine: >1.03 — ABNORMAL HIGH
Urobilinogen, UA: 0.2
pH: 5.5

## 2010-11-28 LAB — CBC
HCT: 42.1
MCV: 91.2
RBC: 4.61
WBC: 6.3

## 2010-11-28 LAB — PREGNANCY, URINE: Preg Test, Ur: NEGATIVE

## 2010-11-28 LAB — BASIC METABOLIC PANEL
BUN: 14
Chloride: 106
GFR calc Af Amer: >60
Potassium: 3.8

## 2010-11-28 LAB — DIFFERENTIAL
Eosinophils Absolute: 0.1
Eosinophils Relative: 2
Lymphs Abs: 2.7
Monocytes Absolute: 0.4
Monocytes Relative: 7

## 2010-12-11 LAB — DIFFERENTIAL
Basophils Absolute: 0
Basophils Relative: 0
Monocytes Absolute: 0.6
Neutro Abs: 6.2
Neutrophils Relative %: 76

## 2010-12-11 LAB — BASIC METABOLIC PANEL
CO2: 26
Calcium: 9.1
Chloride: 105
GFR calc Af Amer: >60
Sodium: 138

## 2010-12-11 LAB — URINALYSIS, ROUTINE W REFLEX MICROSCOPIC
Glucose, UA: NEGATIVE
Ketones, ur: 15 — AB
Protein, ur: NEGATIVE

## 2010-12-11 LAB — URINE MICROSCOPIC-ADD ON

## 2010-12-11 LAB — CBC
Hemoglobin: 13.6
MCHC: 33.6
RDW: 11.9

## 2011-01-02 ENCOUNTER — Ambulatory Visit (INDEPENDENT_AMBULATORY_CARE_PROVIDER_SITE_OTHER): Payer: BC Managed Care – PPO | Admitting: Orthopedic Surgery

## 2011-01-02 ENCOUNTER — Encounter: Payer: Self-pay | Admitting: Orthopedic Surgery

## 2011-01-02 VITALS — Ht 65.0 in | Wt 203.0 lb

## 2011-01-02 DIAGNOSIS — M5137 Other intervertebral disc degeneration, lumbosacral region: Secondary | ICD-10-CM

## 2011-01-02 DIAGNOSIS — M5136 Other intervertebral disc degeneration, lumbar region: Secondary | ICD-10-CM

## 2011-01-02 NOTE — Patient Instructions (Signed)
ESI WILL BE SET UP FOR L4-5 INJECTION OF STEROIDS

## 2011-01-02 NOTE — Progress Notes (Signed)
X-ray report LEFT hip  AP lateral LEFT hip for LEFT hip pain patient complaining of pain running down her LEFT hip and leg  The joint spaces are normal.  The bone is well preserved with no abnormality.  Impression normal x-ray LEFT hip

## 2011-01-02 NOTE — Progress Notes (Signed)
Follow-up visit at the request of neurosurgeon Dr. Coletta Memos CT myelogram results  L4-5: Mild bulge slightly greater left lateral position  approaching but not compressing the exiting left L4 nerve root.  Facet joint degenerative changes greater on the left.  LEFT lower extremity pain the patient was sent back to her neurosurgeon for reevaluation.  He sent her for a CT myelogram which showed no herniated disc but did show findings noted above.  We gave her an injection in her knee.  She is still having LEFT lower extremity pain starting in the LEFT lumbar and gluteal area radiating all the way down her leg.  She was advised to have epidural injections but wanted her hip checked out first.  Summarizing her current complaint she is having 3 months of sharp throbbing 10 out of 10 constant pain worse with lying down or sitting down no improvement from medication worse with sitting associated with numbness and swelling of the LEFT lower extremity.  Symptoms came on gradually.  Review of systems numbness is noted swelling and stiffness in the leg is noted.  She denies weight loss weight gain fever chills fatigue urgency difficulty urinating or constipation or loose bowels.  Past history is notable for previous hysterectomy knee arthroscopic surgery and lumbar surgery.  Primary care physician is Dr. Lorin Picket. [place]  Medical decision-making: We ordered AP and lateral x-rays of her LEFT hip.  We interpreted those images as following.  He has normal contours of the femoral head with normal joint space at the hip joint.  No abnormalities in the femur or pelvis.  We had previously seen her for this problem so it is an est. Problem it is just worse.    Risk recommend injection with epidural steroids.  [employer] of orthopaedic [employer] epidural steroid injection patient education was distributed.  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

## 2011-01-03 NOTE — Progress Notes (Signed)
Addended by: Adella Hare B on: 01/03/2011 03:06 PM   Modules accepted: Orders

## 2011-01-03 NOTE — Progress Notes (Signed)
Addended by: Adella Hare B on: 01/03/2011 02:57 PM   Modules accepted: Orders

## 2011-01-05 ENCOUNTER — Telehealth: Payer: Self-pay | Admitting: Radiology

## 2011-01-05 NOTE — Telephone Encounter (Signed)
I faxed a referral on this patient to Dr. Eduard Clos to be seen for ESI injections at L4-5.

## 2011-01-08 ENCOUNTER — Telehealth: Payer: Self-pay | Admitting: Radiology

## 2011-01-08 NOTE — Telephone Encounter (Signed)
We received a fax from Dr. Eduard Clos that they had scheduled the patient for her ESI injections on 01-08-11.

## 2011-03-12 ENCOUNTER — Emergency Department (HOSPITAL_COMMUNITY)
Admission: EM | Admit: 2011-03-12 | Discharge: 2011-03-12 | Disposition: A | Discharge status: Home / Self Care | Payer: BC Managed Care – PPO | Attending: Emergency Medicine | Admitting: Emergency Medicine

## 2011-03-12 ENCOUNTER — Encounter (HOSPITAL_COMMUNITY): Payer: Self-pay | Admitting: *Deleted

## 2011-03-12 DIAGNOSIS — G8929 Other chronic pain: Secondary | ICD-10-CM | POA: Insufficient documentation

## 2011-03-12 DIAGNOSIS — E669 Obesity, unspecified: Secondary | ICD-10-CM | POA: Insufficient documentation

## 2011-03-12 DIAGNOSIS — M436 Torticollis: Secondary | ICD-10-CM | POA: Insufficient documentation

## 2011-03-12 DIAGNOSIS — M542 Cervicalgia: Secondary | ICD-10-CM | POA: Insufficient documentation

## 2011-03-12 DIAGNOSIS — R209 Unspecified disturbances of skin sensation: Secondary | ICD-10-CM | POA: Insufficient documentation

## 2011-03-12 MED ORDER — METHOCARBAMOL 500 MG PO TABS: ORAL_TABLET | ORAL | Status: DC

## 2011-03-12 MED ORDER — OXYCODONE-ACETAMINOPHEN 5-325 MG PO TABS: 1.0000 | ORAL_TABLET | Freq: Four times a day (QID) | ORAL | Status: AC | PRN

## 2011-03-12 NOTE — ED Notes (Signed)
C/o left sided neck pain that radiates to left shoulder onset 2 days ago; denies injury

## 2011-03-12 NOTE — ED Provider Notes (Signed)
History     CSN: [id number]  Arrival date & time 03/12/11  1510   First MD Initiated Contact with Patient 03/12/11 1555      Chief Complaint  Patient presents with  . Neck Pain    (Consider location/radiation/quality/duration/timing/severity/associated sxs/prior treatment) Patient is a 46 y.o. female presenting with neck pain. The history is provided by the patient.  Neck Pain  This is a new problem. The current episode started more than 2 days ago. The problem occurs constantly. The problem has been gradually worsening. The pain is associated with nothing. There has been no fever. The pain is present in the left side. The quality of the pain is described as cramping and shooting. The pain radiates to the left scapula and left shoulder. The pain is severe. Exacerbated by: certain positions. The pain is the same all the time. Associated symptoms include tingling. Pertinent negatives include no numbness, no bowel incontinence and no bladder incontinence. Treatments tried: Her own medications.    Past Medical History  Diagnosis Date  . Obesity   . Chronic low back pain   . Depression with anxiety   . Constipation     Past Surgical History  Procedure Date  . Left knee arthroscopy 08/14/2005 Dr. Romeo Apple torn meniscus  . Knee arthroscopy   . Partial hysterectomy   . Lumbar spine surgery   . Back surgery     No family history on file.  History  Substance Use Topics  . Smoking status: Never Smoker   . Smokeless tobacco: Not on file  . Alcohol Use: No    OB History    Grav Para Term Preterm Abortions TAB SAB Ect Mult Living                  Review of Systems  HENT: Positive for neck pain.   Gastrointestinal: Negative for bowel incontinence.  Genitourinary: Negative for bladder incontinence.  Musculoskeletal:       Neck and shoulder pain  Neurological: Positive for tingling. Negative for numbness.    Allergies  Review of patient's allergies indicates no known  allergies.  Home Medications   Current Outpatient Rx  Name Route Sig Dispense Refill  . CYCLOBENZAPRINE HCL 10 MG PO TABS Oral Take 10 mg by mouth 3 (three) times daily as needed.      Marland Kitchen DICLOFENAC SODIUM 50 MG PO TBEC Oral Take 50 mg by mouth 2 (two) times daily.      Marland Kitchen MIRALAX PO Oral Take by mouth.      Marland Kitchen PREDNISONE (PAK) 10 MG PO TABS  Take DS dose pack as directed x 12 days 48 tablet 0  . SERTRALINE HCL 100 MG PO TABS Oral Take 100 mg by mouth daily.      . TRAMADOL HCL 50 MG PO TABS Oral Take 50 mg by mouth every 6 (six) hours as needed.        BP 150/82  Pulse 68  Temp(Src) 98.1 F (36.7 C) (Oral)  Resp 20  Ht 5\' 6"  (1.676 m)  Wt 180 lb (81.647 kg)  BMI 29.05 kg/m2  SpO2 100%  Physical Exam  Nursing note and vitals reviewed. Constitutional: She is oriented to person, place, and time. She appears well-developed and well-nourished.  Non-toxic appearance.  HENT:  Head: Normocephalic.  Right Ear: Tympanic membrane and external ear normal.  Left Ear: Tympanic membrane and external ear normal.  Eyes: EOM and lids are normal. Pupils are equal, round, and reactive to light.  Neck: Normal range of motion. Neck supple. Carotid bruit is not present.       Pain with ROM of the neck. No bruits.  Cardiovascular: Normal rate, regular rhythm, normal heart sounds, intact distal pulses and normal pulses.   Pulmonary/Chest: Breath sounds normal. No respiratory distress.  Abdominal: Soft. Bowel sounds are normal. There is no tenderness. There is no guarding.  Musculoskeletal: Normal range of motion.       Pain with ROM of the left shoulder. Grip symetrical.  Lymphadenopathy:       Head (right side): No submandibular adenopathy present.       Head (left side): No submandibular adenopathy present.    She has no cervical adenopathy.  Neurological: She is alert and oriented to person, place, and time. She has normal strength. No cranial nerve deficit or sensory deficit.  Skin: Skin is warm  and dry.  Psychiatric: She has a normal mood and affect. Her speech is normal.    ED Course  Procedures (including critical care time) Pulse Ox 100% on room air. WNL by my interpretation. Labs Reviewed - No data to display No results found.   Dx: [place]   MDM  I have reviewed nursing notes, vital signs, and all appropriate lab and imaging results for this patient. Pt advised to alternate heat and ice to neck/shoulder. Rx for Robaxin and Percocet.       Kathie Dike, [place] 03/12/11 [phone]

## 2011-03-12 NOTE — ED Provider Notes (Signed)
Medical screening examination/treatment/procedure(s) were performed by non-physician practitioner and as supervising physician I was immediately available for consultation/collaboration.  Donnetta Hutching, MD 03/12/11 [phone]

## 2011-05-09 ENCOUNTER — Ambulatory Visit: Payer: BC Managed Care – PPO | Admitting: Orthopedic Surgery

## 2011-05-16 ENCOUNTER — Encounter: Payer: Self-pay | Admitting: Orthopedic Surgery

## 2011-05-16 ENCOUNTER — Ambulatory Visit (INDEPENDENT_AMBULATORY_CARE_PROVIDER_SITE_OTHER): Payer: BC Managed Care – PPO | Admitting: Orthopedic Surgery

## 2011-05-16 VITALS — BP 120/80 | Ht 66.0 in | Wt 198.0 lb

## 2011-05-16 DIAGNOSIS — M25569 Pain in unspecified knee: Secondary | ICD-10-CM

## 2011-05-16 DIAGNOSIS — IMO0002 Reserved for concepts with insufficient information to code with codable children: Secondary | ICD-10-CM

## 2011-05-16 DIAGNOSIS — M1710 Unilateral primary osteoarthritis, unspecified knee: Secondary | ICD-10-CM

## 2011-05-16 DIAGNOSIS — M23329 Other meniscus derangements, posterior horn of medial meniscus, unspecified knee: Secondary | ICD-10-CM

## 2011-05-16 NOTE — Patient Instructions (Signed)
Office will call

## 2011-05-16 NOTE — Progress Notes (Signed)
Patient ID: Erica Graves, female   DOB: 1965-07-29, 46 y.o.   MRN: [id number] Chief Complaint  Patient presents with  . Knee Pain    left knee pain   Chief complaint: LEFT knee pain and swelling HPI:(4) Previous history of LEFT knee arthroscopy presents back with catching, LEFT knee pain LEFT knee swelling and difficulty getting out of a chair.  Pain is located anteromedially can become severe at times and is functionally limiting  ROS:(2) No numbness no tingling no back pain  PFSH: (1)  Past Surgical History  Procedure Date  . Left knee arthroscopy 08/14/2005 Dr. Romeo Apple torn meniscus  . Knee arthroscopy   . Partial hysterectomy   . Lumbar spine surgery   . Back surgery      Physical Exam(12) GENERAL: normal development   CDV: pulses are normal   Skin: normal  Lymph: nodes were not palpable/normal  Psychiatric: awake, alert and oriented  Neuro: normal sensation  MSK Ambulation remarkable for LEFT lower extremity gait abnormality 1 Small joint effusion, painful range of motion in 90 flexion 120 on the LEFT full range of motion of the RIGHT normal palpation on the RIGHT, medial and anteromedial joint line tenderness with patellofemoral crepitance and pain 2  Strength bilaterally normal 3 Both knees are stable 4 Upper extremity exam  Inspection and palpation revealed no abnormalities in the upper extremities.  Range of motion is full without contracture.  Motor exam is normal with grade 5 strength.  The joints are fully reduced without subluxation.  There is no atrophy or tremor and muscle tone is normal.  All joints are stable.  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

## 2011-05-17 ENCOUNTER — Telehealth: Payer: Self-pay | Admitting: Orthopedic Surgery

## 2011-05-17 ENCOUNTER — Other Ambulatory Visit: Payer: Self-pay | Admitting: *Deleted

## 2011-05-17 NOTE — Telephone Encounter (Signed)
RE: Out-patient surgery scheduled 05/21/11 at [facility], CPT [id number], [phone], ICD9 codes 717.2, 715.96, 719.46. Per [place], [place], at ph# [phone], no pre-authorization is required for out-patient surgery.  This information received per Raina Mina at [facility] pre-admissions, ph # [phone], and verified.

## 2011-05-18 ENCOUNTER — Other Ambulatory Visit (HOSPITAL_COMMUNITY): Payer: BC Managed Care – PPO

## 2011-05-18 ENCOUNTER — Encounter (HOSPITAL_COMMUNITY)
Admission: RE | Admit: 2011-05-18 | Discharge: 2011-05-18 | Disposition: A | Discharge status: Home / Self Care | Payer: BC Managed Care – PPO | Source: Ambulatory Visit | Attending: Orthopedic Surgery | Admitting: Orthopedic Surgery

## 2011-05-18 ENCOUNTER — Encounter (HOSPITAL_COMMUNITY): Payer: Self-pay

## 2011-05-18 ENCOUNTER — Encounter (HOSPITAL_COMMUNITY): Payer: Self-pay | Admitting: Pharmacy Technician

## 2011-05-18 LAB — BASIC METABOLIC PANEL
BUN: 15 mg/dL (ref 6–23)
Calcium: 9.7 mg/dL (ref 8.4–10.5)
Creatinine, Ser: 0.7 mg/dL (ref 0.50–1.10)
GFR calc non Af Amer: >90 mL/min (ref >90)
Glucose, Bld: 95 mg/dL (ref 70–99)
Sodium: 141 mEq/L (ref 135–145)

## 2011-05-18 LAB — SURGICAL PCR SCREEN: MRSA, PCR: NEGATIVE

## 2011-05-18 LAB — CBC
Hemoglobin: 13 g/dL (ref 12.0–15.0)
MCH: 30.3 pg (ref 26.0–34.0)
MCHC: 33.8 g/dL (ref 30.0–36.0)

## 2011-05-18 MED ORDER — CHLORHEXIDINE GLUCONATE 4 % EX LIQD
60.0000 mL | Freq: Once | CUTANEOUS | Status: DC
  Filled 2011-05-18: qty 60

## 2011-05-18 NOTE — Patient Instructions (Addendum)
20 REGINIA BATTIE  05/18/2011   Your procedure is scheduled on:  05/21/2011  Report to [facility] at 9:45 AM.  Call this number if you have problems the morning of surgery: [phone]   Remember:   Do not drink or eat food:After Midnight.    Clear liquids include soda, tea, black coffee, apple or grape juice, broth.     Do not wear jewelry, make-up or nail polish.  Do not wear lotions, powders, or perfumes. You may wear deodorant.  Do not shave 48 hours prior to surgery.  Do not bring valuables to the hospital.  Contacts, dentures or bridgework may not be worn into surgery.  Leave suitcase in the car. After surgery it may be brought to your room.  For patients admitted to the hospital, checkout time is 11:00 AM the day of discharge.   Patients discharged the day of surgery will not be allowed to drive home.  Name and phone number of your driver:   Special Instructions: CHG Shower Shower 2 days before surgery and 1 day before surgery with Hibiclens.   Please read over the following fact sheets that you were given: Pain Booklet, MRSA Information, Surgical Site Infection Prevention and Care and Recovery After Surgery

## 2011-05-21 ENCOUNTER — Ambulatory Visit (HOSPITAL_COMMUNITY): Payer: BC Managed Care – PPO | Admitting: Anesthesiology

## 2011-05-21 ENCOUNTER — Encounter (HOSPITAL_COMMUNITY): Payer: Self-pay | Admitting: *Deleted

## 2011-05-21 ENCOUNTER — Encounter (HOSPITAL_COMMUNITY): Payer: Self-pay | Admitting: Anesthesiology

## 2011-05-21 ENCOUNTER — Ambulatory Visit (HOSPITAL_COMMUNITY)
Admission: RE | Admit: 2011-05-21 | Discharge: 2011-05-21 | Disposition: A | Discharge status: Home / Self Care | Payer: BC Managed Care – PPO | Source: Ambulatory Visit | Attending: Orthopedic Surgery | Admitting: Orthopedic Surgery

## 2011-05-21 ENCOUNTER — Encounter (HOSPITAL_COMMUNITY)
Admission: RE | Disposition: A | Discharge status: Home / Self Care | Payer: Self-pay | Source: Ambulatory Visit | Attending: Orthopedic Surgery

## 2011-05-21 DIAGNOSIS — IMO0002 Reserved for concepts with insufficient information to code with codable children: Secondary | ICD-10-CM

## 2011-05-21 DIAGNOSIS — M25569 Pain in unspecified knee: Secondary | ICD-10-CM

## 2011-05-21 DIAGNOSIS — M23302 Other meniscus derangements, unspecified lateral meniscus, unspecified knee: Secondary | ICD-10-CM | POA: Insufficient documentation

## 2011-05-21 DIAGNOSIS — M23329 Other meniscus derangements, posterior horn of medial meniscus, unspecified knee: Secondary | ICD-10-CM

## 2011-05-21 DIAGNOSIS — M1710 Unilateral primary osteoarthritis, unspecified knee: Secondary | ICD-10-CM

## 2011-05-21 HISTORY — PX: KNEE ARTHROSCOPY: SHX127

## 2011-05-21 SURGERY — ARTHROSCOPY, KNEE
Anesthesia: General | Site: Knee | Laterality: Left | Wound class: Clean

## 2011-05-21 SURGERY — ARTHROSCOPY, KNEE, WITH MEDIAL MENISCECTOMY
Anesthesia: Choice | Laterality: Left

## 2011-05-21 MED ORDER — CEFAZOLIN SODIUM 1-5 GM-% IV SOLN: 1.0000 g | INTRAVENOUS | Status: DC

## 2011-05-21 MED ORDER — OXYCODONE-ACETAMINOPHEN 5-325 MG PO TABS: 1.0000 | ORAL_TABLET | ORAL | Status: AC | PRN

## 2011-05-21 MED ORDER — ACETAMINOPHEN 10 MG/ML IV SOLN
INTRAVENOUS | Status: AC
  Administered 2011-05-21: 1000 mg via INTRAVENOUS
  Filled 2011-05-21: qty 100

## 2011-05-21 MED ORDER — OXYCODONE HCL 5 MG PO TABS
5.0000 mg | ORAL_TABLET | ORAL | Status: DC
  Administered 2011-05-21: 5 mg via ORAL

## 2011-05-21 MED ORDER — EPINEPHRINE HCL 1 MG/ML IJ SOLN
INTRAMUSCULAR | Status: AC
  Filled 2011-05-21: qty 5

## 2011-05-21 MED ORDER — OXYCODONE HCL 5 MG PO TABS
ORAL_TABLET | ORAL | Status: AC
  Administered 2011-05-21: 5 mg via ORAL
  Filled 2011-05-21: qty 1

## 2011-05-21 MED ORDER — BUPIVACAINE-EPINEPHRINE PF 0.5-1:200000 % IJ SOLN
INTRAMUSCULAR | Status: AC
  Filled 2011-05-21: qty 20

## 2011-05-21 MED ORDER — CELECOXIB 100 MG PO CAPS
ORAL_CAPSULE | ORAL | Status: AC
  Administered 2011-05-21: 400 mg via ORAL
  Filled 2011-05-21: qty 4

## 2011-05-21 MED ORDER — FENTANYL CITRATE 0.05 MG/ML IJ SOLN
INTRAMUSCULAR | Status: AC
  Administered 2011-05-21: 50 ug via INTRAVENOUS
  Filled 2011-05-21: qty 2

## 2011-05-21 MED ORDER — FENTANYL CITRATE 0.05 MG/ML IJ SOLN
INTRAMUSCULAR | Status: DC | PRN
  Administered 2011-05-21 (×3): 50 ug via INTRAVENOUS

## 2011-05-21 MED ORDER — ONDANSETRON HCL 4 MG/2ML IJ SOLN
4.0000 mg | Freq: Once | INTRAMUSCULAR | Status: AC
  Administered 2011-05-21: 4 mg via INTRAVENOUS

## 2011-05-21 MED ORDER — MIDAZOLAM HCL 2 MG/2ML IJ SOLN
1.0000 mg | INTRAMUSCULAR | Status: DC | PRN
  Administered 2011-05-21: 2 mg via INTRAVENOUS

## 2011-05-21 MED ORDER — LIDOCAINE HCL 1 % IJ SOLN
INTRAMUSCULAR | Status: DC | PRN
  Administered 2011-05-21: 40 mg via INTRADERMAL

## 2011-05-21 MED ORDER — ACETAMINOPHEN 10 MG/ML IV SOLN
1000.0000 mg | Freq: Once | INTRAVENOUS | Status: AC
  Administered 2011-05-21: 1000 mg via INTRAVENOUS

## 2011-05-21 MED ORDER — LACTATED RINGERS IV SOLN
INTRAVENOUS | Status: DC
  Administered 2011-05-21: 1000 mL via INTRAVENOUS

## 2011-05-21 MED ORDER — MIDAZOLAM HCL 2 MG/2ML IJ SOLN
INTRAMUSCULAR | Status: AC
  Filled 2011-05-21: qty 2

## 2011-05-21 MED ORDER — PROPOFOL 10 MG/ML IV EMUL
INTRAVENOUS | Status: AC
  Filled 2011-05-21: qty 20

## 2011-05-21 MED ORDER — FENTANYL CITRATE 0.05 MG/ML IJ SOLN
25.0000 ug | INTRAMUSCULAR | Status: DC | PRN
  Administered 2011-05-21 (×2): 50 ug via INTRAVENOUS

## 2011-05-21 MED ORDER — PROPOFOL 10 MG/ML IV BOLUS
INTRAVENOUS | Status: DC | PRN
  Administered 2011-05-21: 150 mg via INTRAVENOUS

## 2011-05-21 MED ORDER — SODIUM CHLORIDE 0.9 % IR SOLN
Status: DC | PRN
  Administered 2011-05-21 (×2)

## 2011-05-21 MED ORDER — CEFAZOLIN SODIUM 1-5 GM-% IV SOLN
INTRAVENOUS | Status: DC | PRN
  Administered 2011-05-21: 1 g via INTRAVENOUS

## 2011-05-21 MED ORDER — BUPIVACAINE-EPINEPHRINE PF 0.5-1:200000 % IJ SOLN
INTRAMUSCULAR | Status: DC | PRN
  Administered 2011-05-21: 55 mL
  Administered 2011-05-21: 5 mL

## 2011-05-21 MED ORDER — ONDANSETRON HCL 4 MG/2ML IJ SOLN
INTRAMUSCULAR | Status: AC
  Administered 2011-05-21: 4 mg via INTRAVENOUS
  Filled 2011-05-21: qty 2

## 2011-05-21 MED ORDER — CEFAZOLIN SODIUM 1-5 GM-% IV SOLN
INTRAVENOUS | Status: AC
  Filled 2011-05-21: qty 50

## 2011-05-21 MED ORDER — CELECOXIB 100 MG PO CAPS
400.0000 mg | ORAL_CAPSULE | Freq: Once | ORAL | Status: AC
  Administered 2011-05-21: 400 mg via ORAL

## 2011-05-21 MED ORDER — SODIUM CHLORIDE 0.9 % IR SOLN
Status: DC | PRN
  Administered 2011-05-21: 1000 mL

## 2011-05-21 MED ORDER — FENTANYL CITRATE 0.05 MG/ML IJ SOLN
INTRAMUSCULAR | Status: AC
  Administered 2011-05-21: 50 ug via INTRAVENOUS
  Filled 2011-05-21: qty 5

## 2011-05-21 MED ORDER — EPINEPHRINE HCL 1 MG/ML IJ SOLN
INTRAMUSCULAR | Status: AC
  Filled 2011-05-21: qty 2

## 2011-05-21 MED ORDER — ONDANSETRON HCL 4 MG/2ML IJ SOLN: 4.0000 mg | Freq: Once | INTRAMUSCULAR | Status: DC | PRN

## 2011-05-21 MED ORDER — LIDOCAINE HCL (PF) 1 % IJ SOLN
INTRAMUSCULAR | Status: AC
  Filled 2011-05-21: qty 5

## 2011-05-21 SURGICAL SUPPLY — 58 items
ARTHROWAND PARAGON T2 (SURGICAL WAND)
BAG HAMPER (MISCELLANEOUS) ×3 IMPLANT
BANDAGE ELASTIC 6 VELCRO NS (GAUZE/BANDAGES/DRESSINGS) ×3 IMPLANT
BLADE AGGRESSIVE PLUS 4.0 (BLADE) ×3 IMPLANT
BLADE SURG SZ11 CARB STEEL (BLADE) ×3 IMPLANT
CHLORAPREP W/TINT 26ML (MISCELLANEOUS) ×3 IMPLANT
CLOTH BEACON ORANGE TIMEOUT ST (SAFETY) ×3 IMPLANT
COOLER CRYO IC GRAV AND TUBE (ORTHOPEDIC SUPPLIES) ×3 IMPLANT
CUFF CRYO KNEE LG 20X31 COOLER (ORTHOPEDIC SUPPLIES) IMPLANT
CUFF CRYO KNEE18X23 MED (MISCELLANEOUS) ×2 IMPLANT
CUFF TOURNIQUET SINGLE 34IN LL (TOURNIQUET CUFF) ×2 IMPLANT
CUFF TOURNIQUET SINGLE 44IN (TOURNIQUET CUFF) IMPLANT
CUTTER ANGLED DBL BITE 4.5 (BURR) IMPLANT
DECANTER SPIKE VIAL GLASS SM (MISCELLANEOUS) ×6 IMPLANT
FLOOR PAD 36X40 (MISCELLANEOUS) ×3
GAUZE PETROLATUM 1 X8 (GAUZE/BANDAGES/DRESSINGS) ×2 IMPLANT
GAUZE SPONGE 4X4 16PLY XRAY LF (GAUZE/BANDAGES/DRESSINGS) ×3 IMPLANT
GAUZE XEROFORM 5X9 LF (GAUZE/BANDAGES/DRESSINGS) ×3 IMPLANT
GLOVE BIOGEL PI IND STRL 7.0 (GLOVE) ×1 IMPLANT
GLOVE BIOGEL PI IND STRL 7.5 (GLOVE) ×1 IMPLANT
GLOVE BIOGEL PI INDICATOR 7.0 (GLOVE) ×1
GLOVE BIOGEL PI INDICATOR 7.5 (GLOVE) ×1
GLOVE ECLIPSE 7.0 STRL STRAW (GLOVE) ×2 IMPLANT
GLOVE SKINSENSE NS SZ8.0 LF (GLOVE) ×1
GLOVE SKINSENSE STRL SZ8.0 LF (GLOVE) ×2 IMPLANT
GLOVE SS N UNI LF 8.5 STRL (GLOVE) ×3 IMPLANT
GOWN STRL REIN XL XLG (GOWN DISPOSABLE) ×9 IMPLANT
HLDR LEG FOAM (MISCELLANEOUS) ×2 IMPLANT
IV NS IRRIG 3000ML ARTHROMATIC (IV SOLUTION) ×16 IMPLANT
KIT BLADEGUARD II DBL (SET/KITS/TRAYS/PACK) ×3 IMPLANT
KIT ROOM TURNOVER AP CYSTO (KITS) ×3 IMPLANT
LEG HOLDER FOAM (MISCELLANEOUS) ×1
MANIFOLD NEPTUNE II (INSTRUMENTS) ×3 IMPLANT
MARKER SKIN DUAL TIP RULER LAB (MISCELLANEOUS) ×3 IMPLANT
NDL HYPO 18GX1.5 BLUNT FILL (NEEDLE) ×1 IMPLANT
NDL HYPO 21X1.5 SAFETY (NEEDLE) ×1 IMPLANT
NDL SPNL 18GX3.5 QUINCKE PK (NEEDLE) ×1 IMPLANT
NEEDLE HYPO 18GX1.5 BLUNT FILL (NEEDLE) ×3 IMPLANT
NEEDLE HYPO 21X1.5 SAFETY (NEEDLE) ×3 IMPLANT
NEEDLE SPNL 18GX3.5 QUINCKE PK (NEEDLE) IMPLANT
NS IRRIG 1000ML POUR BTL (IV SOLUTION) ×3 IMPLANT
PACK ARTHRO LIMB DRAPE STRL (MISCELLANEOUS) ×3 IMPLANT
PAD ABD 5X9 TENDERSORB (GAUZE/BANDAGES/DRESSINGS) ×3 IMPLANT
PAD ARMBOARD 7.5X6 YLW CONV (MISCELLANEOUS) ×3 IMPLANT
PAD FLOOR 36X40 (MISCELLANEOUS) ×2 IMPLANT
PADDING CAST COTTON 6X4 STRL (CAST SUPPLIES) ×3 IMPLANT
SET ARTHROSCOPY INST (INSTRUMENTS) ×3 IMPLANT
SET ARTHROSCOPY PUMP TUBE (IRRIGATION / IRRIGATOR) ×3 IMPLANT
SET BASIN LINEN APH (SET/KITS/TRAYS/PACK) ×3 IMPLANT
SPONGE GAUZE 4X4 12PLY (GAUZE/BANDAGES/DRESSINGS) ×3 IMPLANT
STRIP CLOSURE SKIN 1/2X4 (GAUZE/BANDAGES/DRESSINGS) ×3 IMPLANT
SUT ETHILON 3 0 FSL (SUTURE) ×2 IMPLANT
SYR 30ML LL (SYRINGE) ×3 IMPLANT
SYRINGE 10CC LL (SYRINGE) ×3 IMPLANT
WAND 50 DEG COVAC W/CORD (SURGICAL WAND) ×2 IMPLANT
WAND 90 DEG TURBOVAC W/CORD (SURGICAL WAND) IMPLANT
WAND ARTHRO PARAGON T2 (SURGICAL WAND) IMPLANT
YANKAUER SUCT BULB TIP 10FT TU (MISCELLANEOUS) ×9 IMPLANT

## 2011-05-21 NOTE — Anesthesia Preprocedure Evaluation (Addendum)
Anesthesia Evaluation  Patient identified by MRN, date of birth, ID band Patient awake    Reviewed: Allergy & Precautions, H&P , NPO status , Patient's Chart, lab work & pertinent test results  History of Anesthesia Complications Negative for: history of anesthetic complications  Airway Mallampati: II      Dental  (+) Teeth Intact   Pulmonary  breath sounds clear to auscultation        Cardiovascular negative cardio ROS  Rhythm:Regular     Neuro/Psych PSYCHIATRIC DISORDERS Depression    GI/Hepatic negative GI ROS,   Endo/Other    Renal/GU      Musculoskeletal   Abdominal   Peds  Hematology   Anesthesia Other Findings   Reproductive/Obstetrics                          Anesthesia Physical Anesthesia Plan  ASA: II  Anesthesia Plan: General   Post-op Pain Management:    Induction: Intravenous  Airway Management Planned: LMA  Additional Equipment:   Intra-op Plan:   Post-operative Plan: Extubation in OR  Informed Consent: I have reviewed the patients History and Physical, chart, labs and discussed the procedure including the risks, benefits and alternatives for the proposed anesthesia with the patient or authorized representative who has indicated his/her understanding and acceptance.     Plan Discussed with:   Anesthesia Plan Comments:         Anesthesia Quick Evaluation

## 2011-05-21 NOTE — Brief Op Note (Signed)
05/21/2011  1:07 PM  PATIENT:  Erica Graves  46 y.o. female  PRE-OPERATIVE DIAGNOSIS:  arthritis medial meniscus tear left knee  POST-OPERATIVE DIAGNOSIS:  arthritis lateral meniscus tear left knee  PROCEDURE:  Procedure(s) (LRB): KNEE ARTHROSCOPY WITH laetral MENISECTOMY (Left)  Findings:   MEDIAL: GRADE 3 TIBIA AND FEMUR; GRADE 4 FEMUR AND TIBIA AND LATERAL MENISCUS TEAR   SURGEON:  Surgeon(s) and Role:    * Vickki Hearing, MD - Primary  PHYSICIAN ASSISTANT:   ASSISTANTS: none   ANESTHESIA:   general  EBL:  Total I/O In: 200 [I.V.:200] Out: 0   BLOOD ADMINISTERED:none  DRAINS: none   LOCAL MEDICATIONS USED:  MARCAINE with epi 60 cc  SPECIMEN:  No Specimen  DISPOSITION OF SPECIMEN:  N/A  COUNTS:  YES  TOURNIQUET:  * Missing tourniquet times found for documented tourniquets in log:  [id number] *  DICTATION: .Dragon Dictation  PLAN OF CARE: Discharge to home after PACU  PATIENT DISPOSITION:  PACU - hemodynamically stable.   Delay start of Pharmacological VTE agent (>24hrs) due to surgical blood loss or risk of bleeding: not applicable

## 2011-05-21 NOTE — Anesthesia Procedure Notes (Signed)
Procedure Name: LMA Insertion Date/Time: 05/21/2011 12:03 PM Performed by: Glynn Octave E Pre-anesthesia Checklist: Patient identified, Patient being monitored, Emergency Drugs available, Timeout performed and Suction available Patient Re-evaluated:Patient Re-evaluated prior to inductionOxygen Delivery Method: Circle System Utilized Preoxygenation: Pre-oxygenation with 100% oxygen Intubation Type: IV induction Ventilation: Mask ventilation without difficulty LMA: LMA inserted LMA Size: 3.0 Number of attempts: 1 Placement Confirmation: positive ETCO2 and breath sounds checked- equal and bilateral Tube secured with: Tape

## 2011-05-21 NOTE — Op Note (Signed)
05/21/2011  1:07 PM  PATIENT:  Erica Graves  46 y.o. female  PRE-OPERATIVE DIAGNOSIS:  arthritis medial meniscus tear left knee  POST-OPERATIVE DIAGNOSIS:  arthritis lateral meniscus tear left knee  PROCEDURE:  Procedure(s) (LRB): KNEE ARTHROSCOPY WITH laetral MENISECTOMY (Left)  Findings:   MEDIAL:  GRADE 3 TIBIA AND FEMUR;  GRADE 4 FEMUR AND TIBIA AND  LATERAL MENISCUS TEAR   DETAILS:  Preop diagnosis osteoarthritis torn medial meniscus LEFT knee  Postop diagnosis LATERAL MENISCUS TEAR; OA KNEE   Procedure arthroscopy LEFT KNEE LATERAL MENISECTOMY.  Indications for procedure pain mechanical symptoms unresponsive to nonoperative treatment  The patient was identified in the preop holding area, the right knee was confirmed as a surgical site marked. The chart was reviewed  The patient was taken to the operating room She was given appropriate preoperative antibiotic and general anesthesia was administered. The  leg was placed in the arthroscopic leg holder, the left leg was placed in a well leg holder  The LEFT  leg was then prepped and draped with ChloraPrep  The surgical site was confirmed and the timeout procedure was completed  The lateral portal was injected with Marcaine with epinephrine solution and a stab wound was made. The scope was placed in the lateral portal into the medial compartment. A diagnostic arthroscopy was completed . A medial portal was established in the same fashion and a probe was placed into the joint. A diagnostic arthroscopy to be was repeated using a probe to palpate intra-articular structures  The patellofemoral joint was normal.  A duckbill forceps was used to morcellized the meniscal tear, the fragments were removed with a motorized shaver. The meniscus was then balanced with an ArthroCare wand 50 probe.  A probe was then used to confirm a stable rim.  The knee was irrigated meniscal fragments were remaining were removed. The portals were  closed with 3-0 nylon suture to on the lateral side one on the medial side. The knee joint was then injected with 45 cc of Marcaine with epinephrine. A sterile dressing was applied followed by an Ace bandage and a Cryo/Cuff.  The patient was extubated and taken to the recovery room in stable condition.  SURGEON:  Surgeon(s) and Role:    * Vickki Hearing, MD - Primary  PHYSICIAN ASSISTANT:   ASSISTANTS: none   ANESTHESIA:   general  EBL:  Total I/O In: 200 [I.V.:200] Out: 0   BLOOD ADMINISTERED: none  DRAINS: none   LOCAL MEDICATIONS USED:  MARCAINE with epi 60 cc  SPECIMEN:  No Specimen  DISPOSITION OF SPECIMEN:  N/A  COUNTS:  YES  TOURNIQUET:  * Missing tourniquet times found for documented tourniquets in log:  [id number] *  DICTATION: .Dragon Dictation  PLAN OF CARE: Discharge to home after PACU  PATIENT DISPOSITION:  PACU - hemodynamically stable.   Delay start of Pharmacological VTE agent (>24hrs) due to surgical blood loss or risk of bleeding: not applicable

## 2011-05-21 NOTE — Transfer of Care (Signed)
Immediate Anesthesia Transfer of Care Note  Patient: Erica Graves  Procedure(s) Performed: Procedure(s) (LRB): KNEE ARTHROSCOPY WITH MEDIAL MENISECTOMY (Left)  Patient Location: PACU  Anesthesia Type: General  Level of Consciousness: awake, alert  and oriented  Airway & Oxygen Therapy: Patient Spontanous Breathing and Patient connected to face mask oxygen  Post-op Assessment: Report given to PACU RN  Post vital signs: Reviewed and stable  Complications: No apparent anesthesia complications

## 2011-05-21 NOTE — Interval H&P Note (Signed)
History and Physical Interval Note:  05/21/2011 11:27 AM  Erica Graves  has presented today for surgery, with the diagnosis of arthritis medial meniscus tear left knee  The various methods of treatment have been discussed with the patient and family. After consideration of risks, benefits and other options for treatment, the patient has consented to  Procedure(s) (LRB): KNEE ARTHROSCOPY WITH MEDIAL MENISECTOMY (Left) as a surgical intervention .  The patients' history has been reviewed, patient examined, no change in status, stable for surgery.  I have reviewed the patients' chart and labs.  Questions were answered to the patient's satisfaction.     Fuller Canada

## 2011-05-21 NOTE — H&P (Addendum)
  Chief Complaint   Patient presents with   .  Knee Pain     left knee pain   Chief complaint: LEFT knee pain and swelling  HPI:(4) Previous history of LEFT knee arthroscopy presents back with catching, LEFT knee pain LEFT knee swelling and difficulty getting out of a chair. Pain is located anteromedially can become severe at times and is functionally limiting  ROS:(2) No numbness no tingling no back pain  PFSH: (1)  Past Surgical History   Procedure  Date   .  Left knee arthroscopy  08/14/2005 Dr. Romeo Apple torn meniscus   .  Knee arthroscopy    .  Partial hysterectomy    .  Lumbar spine surgery    .  Back surgery     Past Medical History  Diagnosis Date  . Obesity   . Chronic low back pain   . Depression with anxiety   . Constipation    History   Social History  . Marital Status: Married    Spouse Name: N/A    Number of Children: N/A  . Years of Education: college   Occupational History  . school Youth worker    Social History Main Topics  . Smoking status: Never Smoker   . Smokeless tobacco: Not on file  . Alcohol Use: No  . Drug Use: No  . Sexually Active: Not on file   Other Topics Concern  . Not on file   Social History Narrative  . No narrative on file     Physical Exam GENERAL: normal development  CDV: pulses are normal  Skin: normal  Lymph: nodes were not palpable/normal  Psychiatric: awake, alert and oriented  Neuro: normal sensation  MSK Ambulation remarkable for LEFT lower extremity gait abnormality   1 Small joint effusion, painful range of motion in 90 flexion 120 on the LEFT full range of motion of the RIGHT normal palpation on the RIGHT, medial and anteromedial joint line tenderness with patellofemoral crepitance and pain  2 Strength bilaterally normal  3 Both knees are stable   4 Upper extremity exam  Inspection and palpation revealed no abnormalities in the upper extremities. Range of motion is full without contracture.  Motor  exam is normal with grade 5 strength.  The joints are fully reduced without subluxation.  There is no atrophy or tremor and muscle tone is normal. All joints are stable.   Imaging: NA  Assessment: LEFT KNEE OA WITH PROBABLE MEDIALMENISCUS AND PTF DX  Plan: SALK

## 2011-05-21 NOTE — Interval H&P Note (Signed)
History and Physical Interval Note:  05/21/2011 11:28 AM  Erica Graves  has presented today for surgery, with the diagnosis of arthritis medial meniscus tear left knee  The various methods of treatment have been discussed with the patient and family. After consideration of risks, benefits and other options for treatment, the patient has consented to  Procedure(s) (LRB):LEFT KNEE ARTHROSCOPY WITH MEDIAL MENISECTOMY (Left) as a surgical intervention .  The patients' history has been reviewed, patient examined, no change in status, stable for surgery.  I have reviewed the patients' chart and labs.  Questions were answered to the patient's satisfaction.     Fuller Canada

## 2011-05-21 NOTE — Anesthesia Postprocedure Evaluation (Signed)
  Anesthesia Post-op Note  Patient: Erica Graves  Procedure(s) Performed: Procedure(s) (LRB): KNEE ARTHROSCOPY WITH MEDIAL MENISECTOMY (Left)  Patient Location: PACU  Anesthesia Type: General  Level of Consciousness: awake, alert  and oriented  Airway and Oxygen Therapy: Patient Spontanous Breathing and Patient connected to face mask oxygen  Post-op Pain: mild  Post-op Assessment: Post-op Vital signs reviewed, Patient's Cardiovascular Status Stable, Respiratory Function Stable, Patent Airway and No signs of Nausea or vomiting  Post-op Vital Signs: Reviewed and stable  Complications: No apparent anesthesia complications

## 2011-05-23 ENCOUNTER — Ambulatory Visit (INDEPENDENT_AMBULATORY_CARE_PROVIDER_SITE_OTHER): Payer: BC Managed Care – PPO | Admitting: Orthopedic Surgery

## 2011-05-23 ENCOUNTER — Encounter: Payer: Self-pay | Admitting: Orthopedic Surgery

## 2011-05-23 VITALS — BP 136/80 | Ht 66.0 in | Wt 180.0 lb

## 2011-05-23 DIAGNOSIS — Z9889 Other specified postprocedural states: Secondary | ICD-10-CM

## 2011-05-23 NOTE — Patient Instructions (Signed)
START PHYSICAL THERAPY

## 2011-05-23 NOTE — Progress Notes (Signed)
Patient ID: Erica Graves, female   DOB: Jan 29, 1966, 46 y.o.   MRN: [id number] Sutures removed, patient tolerated well PT ordered for patient and follow up in 3 weeks

## 2011-05-23 NOTE — Progress Notes (Signed)
Patient ID: Erica Graves, female   DOB: [birth year], 46 y.o.   MRN: [id number] Chief Complaint  Patient presents with  . Routine Post Op    post op 1, left knee, DOS 05/21/11     Status post arthroscopy LEFT knee with lateral meniscal tear  At the time of surgery we also found that the patient had grade 4 degenerative changes of the lateral compartment there was a tear of the posterior horn of the lateral meniscus.  There was also synovitis and degenerative changes as follows grade 3 tibia and femur medial sign grade 4 tibia and femur lateral side with lateral meniscal tear.  Dr. Was not here in Surgery patient had her sutures removed in physical therapy was started with a followup scheduled in 3 weeks. Candidate for synvisc

## 2011-05-24 ENCOUNTER — Encounter (HOSPITAL_COMMUNITY): Payer: Self-pay | Admitting: Orthopedic Surgery

## 2011-06-05 ENCOUNTER — Ambulatory Visit (HOSPITAL_COMMUNITY)
Admission: RE | Admit: 2011-06-05 | Discharge: 2011-06-05 | Disposition: A | Discharge status: Home / Self Care | Payer: BC Managed Care – PPO | Source: Ambulatory Visit | Attending: Orthopedic Surgery | Admitting: Orthopedic Surgery

## 2011-06-05 DIAGNOSIS — IMO0001 Reserved for inherently not codable concepts without codable children: Secondary | ICD-10-CM | POA: Insufficient documentation

## 2011-06-05 DIAGNOSIS — R262 Difficulty in walking, not elsewhere classified: Secondary | ICD-10-CM | POA: Insufficient documentation

## 2011-06-05 DIAGNOSIS — M25669 Stiffness of unspecified knee, not elsewhere classified: Secondary | ICD-10-CM | POA: Insufficient documentation

## 2011-06-05 DIAGNOSIS — M25569 Pain in unspecified knee: Secondary | ICD-10-CM | POA: Insufficient documentation

## 2011-06-05 DIAGNOSIS — M6281 Muscle weakness (generalized): Secondary | ICD-10-CM | POA: Insufficient documentation

## 2011-06-05 NOTE — Evaluation (Signed)
Physical Therapy Evaluation  Patient Details  Name: Erica Graves MRN: [id number] Date of Birth: 10/20/1965  Today's Date: 06/05/2011 Time: 4098-1191 Time Calculation (min): 43 min  Visit#: 1  of 9   Re-eval: 06/26/11 Assessment Diagnosis: medial menisectomy Surgical Date: 05/21/11 Next MD Visit: 06/13/11 Prior Therapy: none  Past Medical History:  Past Medical History  Diagnosis Date  . Obesity   . Chronic low back pain   . Depression with anxiety   . Constipation    Past Surgical History:  Past Surgical History  Procedure Date  . Left knee arthroscopy 08/14/2005 Dr. Romeo Apple torn meniscus  . Knee arthroscopy   . Partial hysterectomy   . Lumbar spine surgery   . Back surgery   . Knee arthroscopy 05/21/2011    Procedure: ARTHROSCOPY KNEE;  Surgeon: Vickki Hearing, MD;  Location: [facility];  Service: Orthopedics;  Laterality: Left;  lateral menisectomy    Subjective Symptoms/Limitations Symptoms: Ms. Hugh had menisectomy five years ago but then her knee started locking up on her.  An MRI report showed a torn meniscus and the patient had surgery on the 25th of March.  She is now being referred to threapy to return the patient to prior functional level.. How long can you sit comfortably?: The patient states that after 30-40 minutes of sitting the back of her knee will get stiff.   How long can you stand comfortably?: The patient is able to stand for ten minutes before she wants to shift her weight to her right leg. How long can you walk comfortably?: The patient can walk with crutches.  She has not tried walking for prolong periods of time. Pain Assessment Currently in Pain?: Yes (highest pain in her L knee has been an 8/10) Pain Score:   2 Pain Location: Knee Pain Orientation: Left;Medial Pain Type: Surgical pain Pain Onset: More than a month ago Pain Frequency: Constant Pain Relieving Factors: ice; elevate.    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 Leisure: Hobbies-yes (Comment)   Assessment RLE AROM (degrees) Right Knee Extension 0-130: 8  Right Knee Flexion 0-140: 108  RLE Strength Right Hip Flexion: 5/5 Right Hip Extension: 4/5 Right Hip ABduction: 5/5 Right Hip ADduction: 4/5 Right Knee Flexion: 5/5 Right Knee Extension: 3/5 Right Ankle Dorsiflexion: 5/5  Exercise/Treatments  Stretches Active Hamstring Stretch: 3 reps;30 seconds Long Arc Quad: Left;5 reps Supine Quad Sets: 10 reps Heel Slides: Left;5 reps Sidelying   Prone  Hip Extension: 5 reps;Left   Physical Therapy Assessment and Plan PT Assessment and Plan Clinical Impression Statement: Pt with stiffness, decreased strength and difficulty walking who will benefit from skilled PT to improve functional level to return to work and leisure activity. PT Frequency: Min 3X/week PT Duration:  (3 weeks) PT Plan: Ther ex and modalities if neede to return pt to full-time employment.  Next treatment minisquat, terminal ext, rockerboard, heel raise, and gait train. Modalities if needed after treatment    Goals Home Exercise Program Pt will Perform Home Exercise Program: Independently PT Short Term Goals Time to Complete Short Term Goals: 2 weeks PT Short Term Goal 1: Pt to be able to sit for an hour and a half without discomfort. PT Short Term Goal 2: Pt to be able to stand for 30 minutes without  discomfort PT Short Term Goal 3: Pt pain no greater tnan a 3 PT Short Term Goal 4: Pt no longer  using crutches PT Long Term Goals Time to Complete Long Term Goals: 4 weeks PT Long Term Goal 1: Pt to be able to stand for 45 min PT Long Term Goal 2: Pt to be able to be on her feet for 3-4 hours to prepare for returning to work Long Term Goal 3: strength to be wfl to allow the above to occur Long Term Goal 4: Pt to report no pain in the past 48 hours. PT Long Term Goal 5: Pt to be able to play with  nieces and nephews outside  Problem List Patient Active Problem List  Diagnoses  . OVERWEIGHT  . DEPRESSION/ANXIETY  . CONSTIPATION  . DEGENERATIVE JOINT DISEASE, KNEES, BILATERAL  . SHOULDER PAIN, LEFT  . PATELLO-FEMORAL SYNDROME  . Lumbago  . FOOT PAIN, LEFT  . Medial meniscus, posterior horn derangement  . Stiffness of knee joint    PT - End of Session Activity Tolerance: Patient tolerated treatment well General Behavior During Session: [facility] for tasks performed Cognition: [facility] for tasks performed PT Plan of Care Consulted and Agree with Plan of Care: Patient    Rosalva Neary,CINDY 06/05/2011, 2:51 PM  Physician Documentation Your signature is required to indicate approval of the treatment plan as stated above.  Please sign and either send electronically or make a copy of this report for your files and return this physician signed original.   Please mark one 1.__approve of plan  2. ___approve of plan with the following conditions.   ______________________________                                                          _____________________ Physician Signature                                                                                                             Date

## 2011-06-06 ENCOUNTER — Ambulatory Visit (HOSPITAL_COMMUNITY)
Admission: RE | Admit: 2011-06-06 | Discharge: 2011-06-06 | Disposition: A | Discharge status: Home / Self Care | Payer: BC Managed Care – PPO | Source: Ambulatory Visit | Attending: Orthopedic Surgery | Admitting: Orthopedic Surgery

## 2011-06-06 NOTE — Progress Notes (Signed)
Physical Therapy Treatment Patient Details  Name: Erica Graves MRN: [id number] Date of Birth: [birth year]  Today's Date: 06/06/2011 Time: 1012-1048 Time Calculation (min): 36 min Visit#: 2  of 9   Re-eval: 06/26/11 Charges:  therex 30'    Subjective: Symptoms/Limitations Symptoms: Pt. states her knee is doing well, having no pain today.  Pt. continues to use 1 crutch for community ambulation, no AD indoors. Pain Assessment Currently in Pain?: No/denies  Exercises Instructed by Trilby Leaver, SPTA under the direction of Dezra Mandella Bascom Levels, PTA/CI. Exercise/Treatments Stretches Active Hamstring Stretch: 3 reps;30 seconds Aerobic Stationary Bike: 6'@2 .0 Standing Heel Raises: 15 reps Terminal Knee Extension: 15 reps Wall Squat: 15 reps Rocker Board: 2 minutes;Limitations Rocker Board Limitations: R/L Gait Training: without AD X  Seated Long Arc Quad: 15 reps Supine Quad Sets: 15 reps Straight Leg Raises: 15 reps Prone  Hamstring Curl: 15 reps Hip Extension: 15 reps Other Prone Exercises: TKE 15 reps      Physical Therapy Assessment and Plan PT Assessment and Plan Clinical Impression Statement: Pt. required VC's for rolling toe through with gait; added new exercises without difficulty or increased pain at end of session. PT Plan: Begin SLS and progress stengthening next session.     Problem List Patient Active Problem List  Diagnoses  . OVERWEIGHT  . DEPRESSION/ANXIETY  . CONSTIPATION  . DEGENERATIVE JOINT DISEASE, KNEES, BILATERAL  . SHOULDER PAIN, LEFT  . PATELLO-FEMORAL SYNDROME  . Lumbago  . FOOT PAIN, LEFT  . Medial meniscus, posterior horn derangement  . Stiffness of knee joint    PT - End of Session Activity Tolerance: Patient tolerated treatment well General Behavior During Session: [facility] for tasks performed Cognition: [facility] for tasks performed   Trilby Leaver, SPTA/ Shakima Nisley B. Bascom Levels, PTA 06/06/2011, 11:33 AM

## 2011-06-11 ENCOUNTER — Ambulatory Visit (HOSPITAL_COMMUNITY)
Admission: RE | Admit: 2011-06-11 | Discharge: 2011-06-11 | Disposition: A | Discharge status: Home / Self Care | Payer: BC Managed Care – PPO | Source: Ambulatory Visit | Attending: *Deleted | Admitting: *Deleted

## 2011-06-11 DIAGNOSIS — M25669 Stiffness of unspecified knee, not elsewhere classified: Secondary | ICD-10-CM

## 2011-06-11 NOTE — Progress Notes (Signed)
Physical Therapy Treatment Patient Details  Name: ARCHITA LOMELI MRN: [id number] Date of Birth: 1965-05-17  Today's Date: 06/11/2011 Time: 1191-4782 Time Calculation (min): 42 min Visit#: 3  of 9   Re-eval: 06/26/11  Charge: therex 36 min  Subjective: Symptoms/Limitations Symptoms: Pt stated L knee pain scae 6/10 at entrance, entered dept with no AD. Pain Assessment Currently in Pain?: Yes Pain Score:   6 Pain Location: Knee Pain Orientation: Left  Objective:   Exercise/Treatments Stretches Active Hamstring Stretch: 3 reps;30 seconds Aerobic Stationary Bike: 6'@2 .0 Standing Heel Raises: 15 reps;Limitations Heel Raises Limitations: toe raises 15 reps Terminal Knee Extension: 15 reps;Theraband Theraband Level (Terminal Knee Extension): Level 4 (Blue) Lateral Step Up: Limitations Lateral Step Up Limitations: next session Forward Step Up: Limitations Forward Step Up Limitations: next session Wall Squat: 15 reps Rocker Board: 2 minutes;Limitations Rocker Board Limitations: R/L SLS: > 1' first attempt SLS with Vectors: 5x 5" Gait Training: without AD  Seated Long Arc Quad: 15 reps;Weights Long Arc Quad Weight: 3 lbs. Supine Short Arc Quad Sets: 15 reps;Limitations Short Arc Quad Sets Limitations: 5 sec holds with 3# Straight Leg Raises: 15 reps;Limitations Straight Leg Raises Limitations: 3# Prone  Hamstring Curl: 15 reps;Limitations Hamstring Curl Limitations: 3# Hip Extension: 15 reps Other Prone Exercises: TKE 15 reps      Physical Therapy Assessment and Plan PT Assessment and Plan Clinical Impression Statement: Pt with improved gait mechanics noted ambulating with no AD with good heel to toe gait and equal stride length.  Pt presented with good ankle strategy and balance today with new activity, able to SLS > 1' with no LOB episodes.  Added vector stance for stability without difficulty.  Able to add weight with some mat activities with no difficulty or c/o  pain with visible musculature fatigue at end of reps.  Min cueing for proper form with mat activites.  PT Plan: Begin step training and SLS on foam/rebounder next session.    Goals    Problem List Patient Active Problem List  Diagnoses  . OVERWEIGHT  . DEPRESSION/ANXIETY  . CONSTIPATION  . DEGENERATIVE JOINT DISEASE, KNEES, BILATERAL  . SHOULDER PAIN, LEFT  . PATELLO-FEMORAL SYNDROME  . Lumbago  . FOOT PAIN, LEFT  . Medial meniscus, posterior horn derangement  . Stiffness of knee joint    PT - End of Session Activity Tolerance: Patient tolerated treatment well General Behavior During Session: [facility] for tasks performed Cognition: [facility] for tasks performed  GP No functional reporting required  Juel Burrow, PTA 06/11/2011, 6:38 PM

## 2011-06-13 ENCOUNTER — Ambulatory Visit (INDEPENDENT_AMBULATORY_CARE_PROVIDER_SITE_OTHER): Payer: BC Managed Care – PPO | Admitting: Orthopedic Surgery

## 2011-06-13 ENCOUNTER — Ambulatory Visit (HOSPITAL_COMMUNITY)
Admission: RE | Admit: 2011-06-13 | Discharge: 2011-06-13 | Disposition: A | Discharge status: Home / Self Care | Payer: BC Managed Care – PPO | Source: Ambulatory Visit | Attending: Orthopedic Surgery | Admitting: Orthopedic Surgery

## 2011-06-13 ENCOUNTER — Encounter: Payer: Self-pay | Admitting: Orthopedic Surgery

## 2011-06-13 VITALS — BP 124/82 | Ht 66.0 in | Wt 180.0 lb

## 2011-06-13 DIAGNOSIS — S83289A Other tear of lateral meniscus, current injury, unspecified knee, initial encounter: Secondary | ICD-10-CM | POA: Insufficient documentation

## 2011-06-13 DIAGNOSIS — Z9889 Other specified postprocedural states: Secondary | ICD-10-CM

## 2011-06-13 NOTE — Patient Instructions (Signed)
RTW TOMORROW  ECON HINGE BRACE   RETURN IN 3 WEEKS

## 2011-06-13 NOTE — Progress Notes (Signed)
Patient ID: Erica Graves, female   DOB: 1965-05-23, 46 y.o.   MRN: [id number] Routine Post Op     post op 1, left knee, DOS 05/21/11    Status post arthroscopy LEFT knee with lateral meniscal tear  At the time of surgery we also found that the patient had grade 4 degenerative changes of the lateral compartment there was a tear of the posterior horn of the lateral meniscus. There was also synovitis and degenerative changes as follows grade 3 tibia and femur medial sign grade 4 tibia and femur lateral side with lateral meniscal tear.  hyalgan candidate:    The knee is moving smoothly and there is a small joint effusion. There is no tenderness along the joint line and the McMurray's sign is now normal.  Recommend continued physical therapy return to work on April 18 start wearing an economy hinged brace and we will see if she is a candidate for Hyalgan.

## 2011-06-13 NOTE — Progress Notes (Signed)
Physical Therapy Treatment Patient Details  Name: Erica Graves MRN: [id number] Date of Birth: 08/09/1965  Today's Date: 06/13/2011 Time: 6578-4696 Time Calculation (min): 43 min Visit#: 4  of 9   Re-eval: 06/26/11 Charges: Therex x 32'  Subjective: Symptoms/Limitations Symptoms: Pt states that MD gave her a knee brace today because she was having some instability.  Pain Assessment Currently in Pain?: No/denies Pain Score: 0-No pain   Exercise/Treatments  06/13/11 1400  Knee Exercises: Aerobic  Stationary Bike 8'@2 .0  Knee Exercises: Standing  Heel Raises 20 reps  Heel Raises Limitations toe raises 20 reps  Terminal Knee Extension 15 reps;Theraband  Theraband Level (Terminal Knee Extension) Level 4 (Blue)  Lateral Step Up 15 reps;Step Height: 4";Left (Pt allowed to use toes on RLE secondary to pain/weakness)  Forward Step Up 15 reps;Right;Step Height: 4"  Wall Squat Limitations  Wall Squat Limitations 12x5"  Rocker Board 2 minutes;Limitations  Rocker Board Limitations R/L  SLS with Vectors 5x 5"  Rebounder 10x red ball L SLS  Knee Exercises: Seated  Long Arc Quad 15 reps;Weights  Long Arc Quad Weight 3 lbs.  Knee Exercises: Supine  Straight Leg Raises 15 reps;Limitations  Straight Leg Raises Limitations 3#   Physical Therapy Assessment and Plan PT Assessment and Plan Clinical Impression Statement: Pt completes therex well with minimal need for cueing. Pt does appear to have increased pain with eccentric contraction of quad. Allowed pt to utilize toe on opposite LE to help control descent. Pt completes all other exercises with minimal difficulty. Pt reports 0/10 pain at end of session. PT Plan: Continue to progress per PT POC.     Problem List Patient Active Problem List  Diagnoses  . OVERWEIGHT  . DEPRESSION/ANXIETY  . CONSTIPATION  . DEGENERATIVE JOINT DISEASE, KNEES, BILATERAL  . SHOULDER PAIN, LEFT  . PATELLO-FEMORAL SYNDROME  . Lumbago  . FOOT PAIN,  LEFT  . Medial meniscus, posterior horn derangement  . Stiffness of knee joint  . S/P arthroscopy of left knee  . Lateral meniscus tear    PT - End of Session Activity Tolerance: Patient tolerated treatment well General Behavior During Session: [facility] for tasks performed Cognition: [facility] for tasks performed   Seth Bake, PTA 06/13/2011, 6:09 PM

## 2011-06-14 ENCOUNTER — Ambulatory Visit (HOSPITAL_COMMUNITY): Payer: BC Managed Care – PPO | Admitting: *Deleted

## 2011-06-18 ENCOUNTER — Ambulatory Visit (HOSPITAL_COMMUNITY): Payer: BC Managed Care – PPO | Admitting: *Deleted

## 2011-06-18 ENCOUNTER — Telehealth (HOSPITAL_COMMUNITY): Payer: Self-pay

## 2011-06-20 ENCOUNTER — Ambulatory Visit (HOSPITAL_COMMUNITY)
Admission: RE | Admit: 2011-06-20 | Discharge: 2011-06-20 | Disposition: A | Discharge status: Home / Self Care | Payer: BC Managed Care – PPO | Source: Ambulatory Visit | Attending: Family Medicine | Admitting: Family Medicine

## 2011-06-20 DIAGNOSIS — M25669 Stiffness of unspecified knee, not elsewhere classified: Secondary | ICD-10-CM

## 2011-06-20 NOTE — Progress Notes (Signed)
Physical Therapy Treatment Patient Details  Name: Erica Graves MRN: [id number] Date of Birth: 1965/04/18  Today's Date: 06/20/2011 Time: 7829-5621 Time Calculation (min): 43 min Visit#: 5  of 10   Re-eval: 06/26/11   Subjective: Symptoms/Limitations Symptoms: Pt states the most difficult things for her to do is to squat. Pain Assessment Pain Score:   2 Pain Location: Knee Pain Orientation: Left;Medial   Exercise/Treatments Aerobic Stationary Bike: 8@ 3.0 Machines for Strengthening  Standing Heel Raises: 15 reps Heel Raises Limitations: L only Terminal Knee Extension: Strengthening;15 reps;Theraband Theraband Level (Terminal Knee Extension): Level 4 (Blue) Lateral Step Up: 15 reps;Step Height: 4" Lateral Step Up Limitations: no hands  Forward Step Up: 15 reps;Hand Hold: 0;Step Height: 4" Wall Squat: 15 reps Rocker Board: 2 minutes SLS with Vectors: 3 x 10" Rebounder: 10 yellow Other Standing Knee Exercises: squat to pick up ball off 4" step x 10 Seated Long Arc Quad: 15 reps;Weights Long Arc Quad Weight: 5 lbs. Other Seated Knee Exercises: sit to stand x2 Supine Hip Adduction Isometric: 15 reps   Prone  Hip Extension: Left;15 reps;Limitations Hip Extension Limitations: 5#    Physical Therapy Assessment and Plan PT Assessment and Plan Clinical Impression Statement: Pt did well with all exercises. States knee feels better after treatment. Rehab Potential: Good      Problem List Patient Active Problem List  Diagnoses  . OVERWEIGHT  . DEPRESSION/ANXIETY  . CONSTIPATION  . DEGENERATIVE JOINT DISEASE, KNEES, BILATERAL  . SHOULDER PAIN, LEFT  . PATELLO-FEMORAL SYNDROME  . Lumbago  . FOOT PAIN, LEFT  . Medial meniscus, posterior horn derangement  . Stiffness of knee joint  . S/P arthroscopy of left knee  . Lateral meniscus tear    PT - End of Session Equipment Utilized During Treatment: Gait belt Activity Tolerance: Patient tolerated treatment  well General Behavior During Session: [facility] for tasks performed Cognition: [facility] for tasks performed PT Plan of Care PT Home Exercise Plan: continute to work on squatting activities.  GP No functional reporting required  Shakeda Pearse,CINDY 06/20/2011, 4:00 PM

## 2011-06-22 ENCOUNTER — Inpatient Hospital Stay (HOSPITAL_COMMUNITY)
Admission: RE | Admit: 2011-06-22 | Payer: BC Managed Care – PPO | Source: Ambulatory Visit | Admitting: Physical Therapy

## 2011-06-25 ENCOUNTER — Ambulatory Visit (HOSPITAL_COMMUNITY): Payer: BC Managed Care – PPO | Admitting: Physical Therapy

## 2011-06-25 ENCOUNTER — Telehealth (HOSPITAL_COMMUNITY): Payer: Self-pay

## 2011-06-27 ENCOUNTER — Ambulatory Visit (HOSPITAL_COMMUNITY): Payer: BC Managed Care – PPO | Admitting: Physical Therapy

## 2011-06-29 ENCOUNTER — Inpatient Hospital Stay (HOSPITAL_COMMUNITY): Admission: RE | Admit: 2011-06-29 | Payer: BC Managed Care – PPO | Source: Ambulatory Visit

## 2011-07-02 ENCOUNTER — Ambulatory Visit (HOSPITAL_COMMUNITY): Payer: BC Managed Care – PPO | Admitting: *Deleted

## 2011-07-04 ENCOUNTER — Ambulatory Visit (INDEPENDENT_AMBULATORY_CARE_PROVIDER_SITE_OTHER): Payer: BC Managed Care – PPO | Admitting: Orthopedic Surgery

## 2011-07-04 ENCOUNTER — Telehealth (HOSPITAL_COMMUNITY): Payer: Self-pay

## 2011-07-04 ENCOUNTER — Encounter: Payer: Self-pay | Admitting: Orthopedic Surgery

## 2011-07-04 ENCOUNTER — Inpatient Hospital Stay (HOSPITAL_COMMUNITY)
Admission: RE | Admit: 2011-07-04 | Payer: BC Managed Care – PPO | Source: Ambulatory Visit | Admitting: Physical Therapy

## 2011-07-04 VITALS — BP 112/70 | Ht 66.0 in | Wt 180.0 lb

## 2011-07-04 DIAGNOSIS — IMO0002 Reserved for concepts with insufficient information to code with codable children: Secondary | ICD-10-CM

## 2011-07-04 DIAGNOSIS — Z9889 Other specified postprocedural states: Secondary | ICD-10-CM

## 2011-07-04 DIAGNOSIS — M1710 Unilateral primary osteoarthritis, unspecified knee: Secondary | ICD-10-CM

## 2011-07-04 NOTE — Progress Notes (Signed)
Patient ID: Erica Graves, female   DOB: [birth year], 46 y.o.   MRN: [id number] Chief Complaint  Patient presents with  . Follow-up    follow up left knee, DOS 04/23/11    Arthroscopy LEFT knee.  Patient had grade 3 and grade 4 chondral changes, as well as lateral meniscal tear.  The knee is doing fairly well at this time. No effusion is noted. Full range of motion is returned.  The patient is a candidate for Orthovisc as well as oral chondral supplementation.  Recommend Orthovisc injections once medicine is obtained if the shots once a week for 3 weeks

## 2011-07-04 NOTE — Patient Instructions (Addendum)
OSTEOBIFLEX CARTILAGE SUPPLEMENTS   Set up for orthovisc will call when med obtained

## 2011-07-06 ENCOUNTER — Ambulatory Visit (HOSPITAL_COMMUNITY): Payer: BC Managed Care – PPO | Admitting: Physical Therapy

## 2011-07-19 ENCOUNTER — Telehealth: Payer: Self-pay | Admitting: *Deleted

## 2011-07-19 NOTE — Telephone Encounter (Signed)
REQUEST SUBMITTED FOR ORTHOVISC INJECTIONS 

## 2011-07-30 ENCOUNTER — Telehealth: Payer: Self-pay | Admitting: Orthopedic Surgery

## 2011-07-30 NOTE — Telephone Encounter (Signed)
Patient called back 07/30/11, states did speak with [facility], said they have approved the medication, and that the copay is "0", and that they will call our office about shipping here.  I explained to patient that we are requesting shipment of the medication to the patient, and patient is to then contact our office to schedule the appointment for injection.

## 2011-07-30 NOTE — Telephone Encounter (Signed)
Patient called to check on Orthovisc injection; states received a message from [employer], evidently the specialty pharmacy for [facility].   Please advise patient.  She will in the meantime return this call. Her home ph# (for calling after 3pm is [phone]; work ph from 7am to 2:30pm is [phone].

## 2011-08-08 ENCOUNTER — Ambulatory Visit (INDEPENDENT_AMBULATORY_CARE_PROVIDER_SITE_OTHER): Payer: BC Managed Care – PPO | Admitting: Orthopedic Surgery

## 2011-08-08 ENCOUNTER — Encounter: Payer: Self-pay | Admitting: Orthopedic Surgery

## 2011-08-08 ENCOUNTER — Ambulatory Visit: Payer: BC Managed Care – PPO | Admitting: Orthopedic Surgery

## 2011-08-08 VITALS — BP 144/80 | Ht 66.0 in | Wt 180.0 lb

## 2011-08-08 DIAGNOSIS — M1710 Unilateral primary osteoarthritis, unspecified knee: Secondary | ICD-10-CM

## 2011-08-08 DIAGNOSIS — IMO0002 Reserved for concepts with insufficient information to code with codable children: Secondary | ICD-10-CM

## 2011-08-08 NOTE — Patient Instructions (Signed)
Rest tonight (do nothing) You have received a steroid shot. 15% of patients experience increased pain at the injection site with in the next 24 hours. This is best treated with ice and tylenol extra strength 2 tabs every 8 hours. If you are still having pain please call the office.

## 2011-08-08 NOTE — Progress Notes (Signed)
Patient ID: Erica Graves, female   DOB: Nov 13, 1965, 46 y.o.   MRN: [id number] Chief Complaint  Patient presents with  . Injections    orthovisc injection left knee   Orthovisc injection  Preop diagnosis osteoarthritis left knee  Postoperative diagnosis same  Procedure Orthovisc injection  Verbal consent was obtained. Timeout was completed.  The knee was prepped with alcohol and ethyl chloride. A 20-gauge needle was used to inject 1 bile of Orthovisc into the joint via lateral approach  There were no complications the wound was sterilely dressed.

## 2011-08-15 ENCOUNTER — Encounter: Payer: Self-pay | Admitting: Orthopedic Surgery

## 2011-08-15 ENCOUNTER — Ambulatory Visit (INDEPENDENT_AMBULATORY_CARE_PROVIDER_SITE_OTHER): Payer: BC Managed Care – PPO | Admitting: Orthopedic Surgery

## 2011-08-15 VITALS — BP 120/80 | Ht 66.0 in | Wt 180.0 lb

## 2011-08-15 DIAGNOSIS — M1710 Unilateral primary osteoarthritis, unspecified knee: Secondary | ICD-10-CM

## 2011-08-15 DIAGNOSIS — M179 Osteoarthritis of knee, unspecified: Secondary | ICD-10-CM

## 2011-08-15 DIAGNOSIS — Z9889 Other specified postprocedural states: Secondary | ICD-10-CM

## 2011-08-15 DIAGNOSIS — IMO0002 Reserved for concepts with insufficient information to code with codable children: Secondary | ICD-10-CM

## 2011-08-15 NOTE — Patient Instructions (Signed)
You have received a steroid shot. 15% of patients experience increased pain at the injection site with in the next 24 hours. This is best treated with ice and tylenol extra strength 2 tabs every 8 hours. If you are still having pain please call the office.    

## 2011-08-15 NOTE — Progress Notes (Signed)
Patient ID: Erica Graves, female   DOB: 1965/07/08, 46 y.o.   MRN: [id number] BP 120/80  Ht 5\' 6"  (1.676 m)  Wt 180 lb (81.647 kg)  BMI 29.05 kg/m2  Chief Complaint  Patient presents with  . Injections    orthovisc injection #2 Lt knee    orthovisc injection  # 2/3  left knee  Diagnosis osteoarthritis.  Previous treatment:  oral anti-inflammatory and injection   The left knee was prepped sterilely. Ethyl chloride was used to anesthetize the skin. One vial of orthovisc was injected into the joint via anterolateral approach. There were no complications. The patient was turned in one week for next injection

## 2011-08-29 ENCOUNTER — Ambulatory Visit (INDEPENDENT_AMBULATORY_CARE_PROVIDER_SITE_OTHER): Payer: BC Managed Care – PPO | Admitting: Orthopedic Surgery

## 2011-08-29 ENCOUNTER — Encounter: Payer: Self-pay | Admitting: Orthopedic Surgery

## 2011-08-29 VITALS — BP 120/80 | Ht 66.0 in | Wt 180.0 lb

## 2011-08-29 DIAGNOSIS — M1710 Unilateral primary osteoarthritis, unspecified knee: Secondary | ICD-10-CM

## 2011-08-29 DIAGNOSIS — M654 Radial styloid tenosynovitis [de Quervain]: Secondary | ICD-10-CM

## 2011-08-29 DIAGNOSIS — M179 Osteoarthritis of knee, unspecified: Secondary | ICD-10-CM

## 2011-08-29 DIAGNOSIS — IMO0002 Reserved for concepts with insufficient information to code with codable children: Secondary | ICD-10-CM

## 2011-08-29 DIAGNOSIS — G5600 Carpal tunnel syndrome, unspecified upper limb: Secondary | ICD-10-CM

## 2011-08-29 MED ORDER — DICLOFENAC POTASSIUM 50 MG PO TABS: 50.0000 mg | ORAL_TABLET | Freq: Two times a day (BID) | ORAL | Status: DC

## 2011-08-29 NOTE — Progress Notes (Signed)
ORTHOVISC INJECTION PROCEDURE   DX OA LEFT  KNEE   TIME OUT   VERBAL CONSENT   ALCOHOL PREP   ETHYL CHLORIDE ANESTHESIA   LATERAL APPROACH : INJECTED 1 VIAL OF ORTHOVISC   NO COMPLICATIONS

## 2011-08-29 NOTE — Patient Instructions (Addendum)
Start diclofenac 50 mg bid   Wear splint at night   Apply biofreeze 3 x day to your thumb

## 2011-08-29 NOTE — Progress Notes (Signed)
  Subjective:    Patient ID: Erica Graves, female    DOB: 1965-06-29, 46 y.o.   MRN: [id number] Chief complaint left hand goes to sleep pain at night wakes me up at night severe at night feels like numbness and tingling. Pain along the left thumb near the wrist joint pain with ulnar deviation HPI She came in today to get a third Orthovisc injection complaining of knee pain but also the above complaints. He said carpal tunnel symptoms for a long time the wrist pain is started recently and is exacerbated by activities regarding or ulnar deviation. No treatment. Numbness and tingling are worse at night pain become severe. There is some difficulty picking applied and the objects.   Review of Systems Neuro as stated  Musculoskeletal as stated    Objective:   Physical Exam BP 120/80  Ht 5\' 6"  (1.676 m)  Wt 180 lb (81.647 kg)  BMI 29.05 kg/m2 General appearance normal Orientation x3 normal Mood normal Left hand tender over the extensor tendons of the thumb and metacarpal, range of motion is normal painful on her deviation wrist is stable strength is normal skin is intact ulcers normal temperature of the extremity normal sensory exam is normal to soft touch and pinprick, coordination normal.       Assessment & Plan:  #1 de Quervain's left thumb #2 carpal tunnel left wrist  Recommend thumb spica splint and bio freeze medication x6 weeks

## 2011-10-05 ENCOUNTER — Other Ambulatory Visit: Payer: Self-pay | Admitting: Family Medicine

## 2011-10-05 DIAGNOSIS — Z139 Encounter for screening, unspecified: Secondary | ICD-10-CM

## 2011-10-09 ENCOUNTER — Ambulatory Visit (HOSPITAL_COMMUNITY)
Admission: RE | Admit: 2011-10-09 | Discharge: 2011-10-09 | Disposition: A | Discharge status: Home / Self Care | Payer: BC Managed Care – PPO | Source: Ambulatory Visit | Attending: Family Medicine | Admitting: Family Medicine

## 2011-10-09 DIAGNOSIS — Z139 Encounter for screening, unspecified: Secondary | ICD-10-CM

## 2011-10-09 DIAGNOSIS — Z1211 Encounter for screening for malignant neoplasm of colon: Secondary | ICD-10-CM | POA: Insufficient documentation

## 2011-10-10 ENCOUNTER — Ambulatory Visit (INDEPENDENT_AMBULATORY_CARE_PROVIDER_SITE_OTHER): Payer: BC Managed Care – PPO | Admitting: Orthopedic Surgery

## 2011-10-10 ENCOUNTER — Encounter: Payer: Self-pay | Admitting: Orthopedic Surgery

## 2011-10-10 VITALS — BP 142/90 | Ht 66.0 in | Wt 198.0 lb

## 2011-10-10 DIAGNOSIS — IMO0002 Reserved for concepts with insufficient information to code with codable children: Secondary | ICD-10-CM

## 2011-10-10 DIAGNOSIS — M1710 Unilateral primary osteoarthritis, unspecified knee: Secondary | ICD-10-CM

## 2011-10-10 DIAGNOSIS — M179 Osteoarthritis of knee, unspecified: Secondary | ICD-10-CM

## 2011-10-10 MED ORDER — TRAMADOL-ACETAMINOPHEN 37.5-325 MG PO TABS: 1.0000 | ORAL_TABLET | ORAL | Status: AC | PRN

## 2011-10-10 NOTE — Progress Notes (Signed)
Subjective:     Patient ID: Erica Graves, female   DOB: 06-16-1965, 46 y.o.   MRN: [id number] Chief Complaint  Patient presents with  . Follow-up    6 week recheck left knee post Orthovisc injection   HPI Status post left knee arthroscopy, status post Orthovisc injection x3 about 5 weeks ago  She reports no improvement in her symptoms. She had another locking episode. She is having constant pain. She has a little bit of mild back pain but is nonradiating  Review of Systems As above    Objective:   Physical Exam  BP 142/90  Ht 5\' 6"  (1.676 m)  Wt 198 lb (89.812 kg)  BMI 31.96 kg/m2 \\Normal  appearance, normal orientation. Normal mood Left Knee Exam   Tenderness  The patient is experiencing tenderness in the patella, lateral joint line and medial joint line.  Range of Motion  The patient has normal left knee ROM.  Muscle Strength   The patient has normal left knee strength.  Tests  McMurray:  Medial - negative Lateral - negative Lachman:  Anterior - negative     Drawer:       Anterior - negative     Posterior - negative Varus: negative Valgus: negative Patellar Apprehension: negative  Other  Erythema: absent Scars: absent Sensation: normal Pulse: present Swelling: none         Assessment:     Persistent osteoarthritis of left knee  Patient's age of 46 makes me reluctant to replace the knee at this point. The diclofenac is causing a headache.    Plan:     Stop diclofenac start Ultracet call the office in one week if pain medicines not helping. My next choice would be another anti-inflammatory and codeine.

## 2011-10-10 NOTE — Patient Instructions (Addendum)
Stop diclofenac Start ultracet

## 2011-10-18 ENCOUNTER — Telehealth: Payer: Self-pay | Admitting: Orthopedic Surgery

## 2011-10-18 ENCOUNTER — Other Ambulatory Visit: Payer: Self-pay | Admitting: Orthopedic Surgery

## 2011-10-18 DIAGNOSIS — R110 Nausea: Secondary | ICD-10-CM

## 2011-10-18 MED ORDER — PROMETHAZINE HCL 12.5 MG PO TABS: 12.5000 mg | ORAL_TABLET | Freq: Four times a day (QID) | ORAL | Status: DC | PRN

## 2011-10-18 NOTE — Telephone Encounter (Signed)
Routed to nurse also

## 2011-10-18 NOTE — Telephone Encounter (Signed)
Medications

## 2011-10-18 NOTE — Telephone Encounter (Signed)
Patient called to relay, as advised, about the Tramadol that Dr. Romeo Apple prescribed; states that it has helped some, although said it makes her nauseous.  Asking if she needs to have something prescribed for the nausea, or a different medication than Tramadol.  Her pharmacy is [facility] in [place].  Her phone #'s are (work, until 1:30pm):  # [phone], or cell # [phone].

## 2011-10-23 NOTE — Telephone Encounter (Signed)
Nausea med was sent to pharmacy, called patient, left message

## 2011-12-12 ENCOUNTER — Ambulatory Visit (INDEPENDENT_AMBULATORY_CARE_PROVIDER_SITE_OTHER): Payer: BC Managed Care – PPO | Admitting: Orthopedic Surgery

## 2011-12-12 ENCOUNTER — Encounter: Payer: Self-pay | Admitting: Orthopedic Surgery

## 2011-12-12 VITALS — BP 120/80 | Ht 66.0 in | Wt 200.0 lb

## 2011-12-12 DIAGNOSIS — M1710 Unilateral primary osteoarthritis, unspecified knee: Secondary | ICD-10-CM

## 2011-12-12 DIAGNOSIS — M1712 Unilateral primary osteoarthritis, left knee: Secondary | ICD-10-CM | POA: Insufficient documentation

## 2011-12-12 DIAGNOSIS — IMO0002 Reserved for concepts with insufficient information to code with codable children: Secondary | ICD-10-CM

## 2011-12-12 MED ORDER — HYDROCODONE-ACETAMINOPHEN 5-325 MG PO TABS: 1.0000 | ORAL_TABLET | Freq: Four times a day (QID) | ORAL | Status: DC | PRN

## 2011-12-12 MED ORDER — PROMETHAZINE HCL 12.5 MG PO TABS: 12.5000 mg | ORAL_TABLET | Freq: Four times a day (QID) | ORAL | Status: DC | PRN

## 2011-12-12 NOTE — Progress Notes (Signed)
Patient ID: Erica Graves, female   DOB: 1965/04/13, 46 y.o.   MRN: [id number] Chief Complaint  Patient presents with  . Follow-up    2 month recheck left knee following orthovisc injections   The patient has had arthroscopy twice she's had injections including Synvisc she's been on anti-inflammatories. She reports no improvement in her knee she has 8/10 pain.  We discussed options and basically were down to pain medication, repeat cortisone injections or knee replacement surgery. She would like to wait on the knee replacement surgery. We did give her some Norco with Phenergan because of nausea from taking oral narcotic  Recommend followup approximately 2 weeks prior to the time when she decides to have knee replacement surgery which may be sometime this spring or summer

## 2012-01-09 ENCOUNTER — Encounter (HOSPITAL_COMMUNITY): Payer: Self-pay | Admitting: *Deleted

## 2012-01-09 ENCOUNTER — Telehealth: Payer: Self-pay | Admitting: Orthopedic Surgery

## 2012-01-09 ENCOUNTER — Emergency Department (HOSPITAL_COMMUNITY)
Admission: EM | Admit: 2012-01-09 | Discharge: 2012-01-09 | Disposition: A | Discharge status: Home / Self Care | Payer: BC Managed Care – PPO | Attending: Emergency Medicine | Admitting: Emergency Medicine

## 2012-01-09 DIAGNOSIS — M545 Low back pain, unspecified: Secondary | ICD-10-CM | POA: Insufficient documentation

## 2012-01-09 DIAGNOSIS — G8929 Other chronic pain: Secondary | ICD-10-CM | POA: Insufficient documentation

## 2012-01-09 DIAGNOSIS — R209 Unspecified disturbances of skin sensation: Secondary | ICD-10-CM | POA: Insufficient documentation

## 2012-01-09 DIAGNOSIS — Z8659 Personal history of other mental and behavioral disorders: Secondary | ICD-10-CM | POA: Insufficient documentation

## 2012-01-09 DIAGNOSIS — R300 Dysuria: Secondary | ICD-10-CM | POA: Insufficient documentation

## 2012-01-09 DIAGNOSIS — E669 Obesity, unspecified: Secondary | ICD-10-CM | POA: Insufficient documentation

## 2012-01-09 DIAGNOSIS — Z87442 Personal history of urinary calculi: Secondary | ICD-10-CM | POA: Insufficient documentation

## 2012-01-09 LAB — URINALYSIS, ROUTINE W REFLEX MICROSCOPIC
Glucose, UA: NEGATIVE mg/dL
Hgb urine dipstick: NEGATIVE
Leukocytes, UA: NEGATIVE
Protein, ur: NEGATIVE mg/dL
Specific Gravity, Urine: 1.01 (ref 1.005–1.030)
pH: 6 (ref 5.0–8.0)

## 2012-01-09 NOTE — ED Notes (Signed)
States she already has pain meds at home. Advised to see Dr Romeo Apple or PCP for further problems

## 2012-01-09 NOTE — ED Provider Notes (Signed)
Medical screening examination/treatment/procedure(s) were performed by non-physician practitioner and as supervising physician I was immediately available for consultation/collaboration.   Lyanne Co, MD 01/09/12 [phone]

## 2012-01-09 NOTE — ED Notes (Signed)
Pain low back for 1 week with radiation down both legs, No hx of injury, dysuria. NO fever , sl nausea.

## 2012-01-09 NOTE — Telephone Encounter (Signed)
01/09/12 called back to patient, relayed.

## 2012-01-09 NOTE — Telephone Encounter (Signed)
Patient called with question of left knee pain radiating from hip down towards the knee, with pain increasing with walking.  Asking if this is more of a sign that the knee osteoarthritis is worsening, or if it could be more a case of her back causing it, which she said she can see primary care for?  Please advise.  Patient at work [phone] (Work) or call [phone] [facility])

## 2012-01-09 NOTE — Telephone Encounter (Signed)
That's her back

## 2012-01-09 NOTE — ED Provider Notes (Signed)
History     CSN: [id number]  Arrival date & time 01/09/12  1518   First MD Initiated Contact with Patient 01/09/12 1547      Chief Complaint  Patient presents with  . Back Pain    (Consider location/radiation/quality/duration/timing/severity/associated sxs/prior treatment) HPI Comments: Also has mild dysuria and frequency.  No hematuria or urgency.  No fever or chills.   Patient is a 46 y.o. female presenting with back pain. The history is provided by the patient. No language interpreter was used.  Back Pain  This is a recurrent problem. Episode onset: several days ago. Episode frequency: hurts primarily with walking. The problem has not changed since onset.The pain is associated with no known injury. The pain is present in the lumbar spine. The quality of the pain is described as shooting. Radiates to: ocassionally feels tingling in thigh when walking. The pain is moderate. The symptoms are aggravated by bending and twisting. The pain is the same all the time. Associated symptoms include dysuria, leg pain, paresthesias and tingling. Pertinent negatives include no fever, no bowel incontinence, no perianal numbness, no bladder incontinence, no paresis and no weakness. Treatments tried: take tramadol and hydrocodone at home with minimal relief.    Past Medical History  Diagnosis Date  . Obesity   . Chronic low back pain   . Depression with anxiety   . Constipation   . Kidney stone     Past Surgical History  Procedure Date  . Left knee arthroscopy 08/14/2005 Dr. Romeo Apple torn meniscus  . Knee arthroscopy   . Partial hysterectomy   . Lumbar spine surgery   . Back surgery   . Knee arthroscopy 05/21/2011    Procedure: ARTHROSCOPY KNEE;  Surgeon: Vickki Hearing, MD;  Location: [facility];  Service: Orthopedics;  Laterality: Left;  lateral menisectomy  . Abdominal hysterectomy     History reviewed. No pertinent family history.  History  Substance Use Topics  . Smoking status:  Never Smoker   . Smokeless tobacco: Not on file  . Alcohol Use: No    OB History    Grav Para Term Preterm Abortions TAB SAB Ect Mult Living                  Review of Systems  Constitutional: Negative for fever.  Gastrointestinal: Negative for bowel incontinence.  Genitourinary: Positive for dysuria. Negative for bladder incontinence.  Musculoskeletal: Positive for back pain.  Neurological: Positive for tingling and paresthesias. Negative for weakness.  All other systems reviewed and are negative.    Allergies  Review of patient's allergies indicates no known allergies.  Home Medications   Current Outpatient Rx  Name  Route  Sig  Dispense  Refill  . HYDROCODONE-ACETAMINOPHEN 5-325 MG PO TABS   Oral   Take 1 tablet by mouth every 6 (six) hours as needed for pain.   30 tablet   2   . IBUPROFEN 200 MG PO TABS   Oral   Take 200 mg by mouth every 6 (six) hours as needed. FOR PAIN         . PROMETHAZINE HCL 12.5 MG PO TABS   Oral   Take 12.5 mg by mouth every 6 (six) hours as needed. FOR NAUSEA         . TRAMADOL-ACETAMINOPHEN 37.5-325 MG PO TABS   Oral   Take 1 tablet by mouth Once daily as needed. FOR KNEE PAIN           BP 125/95  Pulse 60  Temp 97.8 F (36.6 C) (Oral)  Resp 18  Ht 5\' 5"  (1.651 m)  Wt 180 lb (81.647 kg)  BMI 29.95 kg/m2  SpO2 100%  Physical Exam  Nursing note and vitals reviewed. Constitutional: She is oriented to person, place, and time. She appears well-developed and well-nourished. No distress.  HENT:  Head: Normocephalic and atraumatic.  Eyes: EOM are normal.  Neck: Normal range of motion.  Cardiovascular: Normal rate and regular rhythm.   Pulmonary/Chest: Effort normal.  Abdominal: Soft. She exhibits no distension. There is no tenderness.  Musculoskeletal: She exhibits tenderness.       Lumbar back: She exhibits decreased range of motion, tenderness and pain. She exhibits no bony tenderness, no swelling, no edema, no  deformity, no laceration, no spasm and normal pulse.       Back:  Neurological: She is alert and oriented to person, place, and time.  Skin: Skin is warm and dry.  Psychiatric: She has a normal mood and affect. Judgment normal.    ED Course  Procedures (including critical care time)   Labs Reviewed  URINALYSIS, ROUTINE W REFLEX MICROSCOPIC   No results found.   1. Acute exacerbation of chronic low back pain       MDM  Has tramadol and hydrocodone Ice F/u with dr. Carollee Herter, PA 01/09/12 [phone]

## 2012-03-05 ENCOUNTER — Encounter: Payer: Self-pay | Admitting: Orthopedic Surgery

## 2012-03-05 ENCOUNTER — Ambulatory Visit (INDEPENDENT_AMBULATORY_CARE_PROVIDER_SITE_OTHER): Payer: BC Managed Care – PPO | Admitting: Orthopedic Surgery

## 2012-03-05 VITALS — Ht 66.0 in | Wt 200.0 lb

## 2012-03-05 DIAGNOSIS — M179 Osteoarthritis of knee, unspecified: Secondary | ICD-10-CM | POA: Insufficient documentation

## 2012-03-05 DIAGNOSIS — M1712 Unilateral primary osteoarthritis, left knee: Secondary | ICD-10-CM

## 2012-03-05 DIAGNOSIS — Z9889 Other specified postprocedural states: Secondary | ICD-10-CM

## 2012-03-05 DIAGNOSIS — IMO0002 Reserved for concepts with insufficient information to code with codable children: Secondary | ICD-10-CM

## 2012-03-05 DIAGNOSIS — M1710 Unilateral primary osteoarthritis, unspecified knee: Secondary | ICD-10-CM

## 2012-03-05 NOTE — Progress Notes (Signed)
Patient ID: Erica Graves, female   DOB: 1965/04/10, 47 y.o.   MRN: [id number] Chief Complaint  Patient presents with  . Follow-up    Schedule knee surgery.   The patient continues to have significant pain in her left knee. Chest with significant pain swelling loss of motion and she is functionally having significant deficits including inability to climb the stairs without pain giving out of the left knee painful swelling at the end of the day.  Her previous treatments include but are not limited to oral anti-inflammatories, cortisone injections, cartilage supplement injections. Arthroscopy twice. Bracing.  After an in-depth discussion she has opted to proceed with knee replacement. I told her this is a procedure that the patient has to tell me that they are ready for and she says she is ready.  We discussed the postoperative plan and complications which include but are not limited to pain, stiffness, swelling, infection, revision surgery which can include multiple procedures. She was also given a knee replacement information to include things such as blood clots and pulmonary embolus.  Her history and physical will be completed near the time of surgery. It is included by reference.

## 2012-03-05 NOTE — Patient Instructions (Addendum)
LEFT TKA ON FEB 3RD   You have been scheduled for surgery.  All surgeries carry some risk.  Remember you always have the option of continued nonsurgical treatment. However in this situation the risks vs. the benefits favor surgery as the best treatment option. The risks of the surgery includes the following but is not limited to bleeding, infection, pulmonary embolus, death from anesthesia, nerve injury vascular injury or need for further surgery, continued pain.  Specific to this procedure the following risks and complications are rare but possible Bleeding requiring blood transfusion Deep vein thrombosis or blood clot requiring blood thinning treatment, this can become a pulmonary embolus which can be fatal Stiffness after knee replacement Continued pain after knee replacement Infection which may require multiple procedures.  If the infection cannot be eradicated amputation above the knee is needed to cure the infection. There are other complications which can arise which cannot be anticipated.  Alternative treatments to knee replacement include physical therapy, pain medications and supportive devices including braces canes walkers and crutches.  If this is unacceptable to you total knee replacement is an option to review of pain. Total Knee Replacement Total knee replacement is a procedure to replace your knee joint with an artificial knee joint (prosthetic knee joint). The purpose of this surgery is to reduce pain and improve your knee function. LET YOUR CAREGIVER KNOW ABOUT:    Any allergies you have.   Any medicines you are taking, including vitamins, herbs, eyedrops, over-the-counter medicines, and creams.   Any problems you have had with the use of anesthetics.   Family history of problems with the use of anesthetics.   Any blood disorders you have, including bleeding problems or clotting problems.   Previous surgeries you have had.  RISKS AND COMPLICATIONS   Generally, total  knee replacement is a safe procedure. However, as with any surgical procedure, complications can occur. Possible complications associated with total knee replacement include:  Loss of range of motion of the knee or instability.   Loosening of the prosthesis.   Infection.   Persistent pain.  BEFORE THE PROCEDURE    Your caregiver will instruct you when you need to stop eating and drinking.   Ask your caregiver if you need to change or stop any regular medicines.  PROCEDURE   Just before the procedure you will receive medicine that will make you drowsy (sedative). This will be given through a tube that is inserted into one of your veins (intravenous [IV] tube). Then you will either receive medicine to block pain from the waist down through your legs (spinal block) or medicine to also receive medicine to make you fall asleep (general anesthetic). You may also receive medicine to block feeling in your leg (nerve block) to help ease pain after surgery. An incision will be made in your knee. Your surgeon will take out any damaged cartilage and bone by sawing off the damaged surfaces. Then the surgeon will put a new metal liner over the sawed off portion of your thigh bone (femur) and a plastic liner over the sawed off portion of one of the bones of your lower leg (tibia). This is to restore alignment and function to your knee. A plastic piece is often used to restore the surface of your knee cap. AFTER THE PROCEDURE   You will be taken to the recovery area. You may have drainage tubes to drain excess fluid from your knee. These tubes attach to a device that removes these fluids. Once  you are awake, stable, and taking fluids well, you will be taken to your hospital room. You will receive physical therapy as prescribed by your caregiver. The length of your stay in the hospital after a knee replacement is 2 4 days. Your surgeon may recommend that you spend time (usually an additional 10 14 days) in an  extended-care facility to help you begin walking again and improve your range of motion before you go home. You may also be prescribed blood-thinning medicine to decrease your risk of developing blood clots in your leg. Document Released: 05/21/2000 Document Revised: 08/14/2011 Document Reviewed: 03/25/2011 [facility] Patient Information 2013 [place], [place].   Preparing for Knee Replacement Recovery from knee replacement surgery can be made easier and more comfortable by taking steps to be prepared before surgery. This includes:  Arranging for others to help you.   Preparing your home.   Making sure your body is prepared by doing a pre-operative exam and being as healthy as you can.   Doing exercises before your surgery as told by your caregiver.  Also, you can ease any concerns about your financial responsibilities by calling your insurance company after you decide to have surgery. In addition to your surgery and hospital stay, you will want to ask about your coverage for medical equipment, rehab facilities, and home care. ARRANGING FOR HELP   You will be getting stronger and more mobile every day. However, in the first couple weeks after surgery, it is unlikely you will be able to do all your daily activities as easily as before your surgery. You will tire easily and still have limited movement in your leg. Follow these guidelines to best arrange for the help you may need after your surgery:  Arrange for someone to drive you home from the hospital. Your surgeon will be able to tell you how many days you can expect to be in the hospital.   Cancel all work, care-giving, and volunteer responsibilities for at least 4 to 6 weeks after your surgery.   If you live alone, arrange for someone to care for your home and pets for the first 4 to 6 weeks.   Select someone with whom you feel comfortable to be with you day and night for the first week. This person will help you with your exercises and  personal care, like bathing and using the toilet.   Arrange for drivers to bring you to and from your doctor and therapy appointments, as well as to the grocery store and other places you need to go, for at least 4 to 6 weeks.   Select 2 or 3 rehab facilities where you would be comfortable recovering just in case you are not able to go directly home to recover.  PREPARING YOUR HOME  Remove all clutter from your floors.   To see if you will be able to move in these spaces with a wheeled walker, hold your hands out about 6 inches from your sides. Then walk from your bed to the bathroom. Walk from your resting spot to your kitchen and bathroom. If you do not hit anything with your hands, you probably have enough room.   Remove throw rugs.   Move the items you use often to shelves and drawers that are at countertop height.  Move items in your bathroom, kitchen, and bedroom.   Prepare a few meals that you can freeze and reheat later.   Do not plan on recovering in bed.  It is better for  your health to sit upright. You may wish to use a recliner with a small table nearby. Keep the items you use most frequently on that table. These may include the TV remote, a cordless phone, a book or laptop computer, water glass, and any other items of your choice.   Consider adding grab bars in the shower and near the toilet.   While you are in the hospital, you will learn about equipment helpful for your recovery. Some of the equipment includes raised toilet seats, tub benches, and shower benches. Often, your hospital care team will help you decide what you need and can direct you about where to buy these items. You may not need all of these items, and they are not often returnable, so it is not recommended that you buy them before going to the hospital.  PREPARING YOUR BODY  Complete a pre-operative exam. This will ensure that your body is healthy enough to safely complete this surgery. Be certain to bring a  complete list of all your medicines and supplements (herbs and vitamins). You may be advised to have additional tests to ensure your safety.   Complete elective dental care and routine cleanings before the surgery. Germs from anywhere in your body, including those in your mouth, can travel to your new joint and infect it.  It is important not to have any dental work performed for at least 3 months after your surgery. After surgery, be sure to tell your dentist about your joint replacement.   Maintain a healthy diet. Unless advised by your surgeon, do not drastically change your diet before your surgery.   Quit smoking. Get help from your caregiver if you need it.   The day before your surgery, follow your surgeon's directions for showering, eating and drinking. These directions are for your safety.  EXERCISES Your caregiver may have you do the following exercises before your surgery. Be sure to follow the exercise program your caregiver prescribes for you. Doing the exercises on both sides will help prepare your "good" side as well. While completing these exercises, remember:    Stretch as long as you can, up to 30 seconds, without pain developing.   You should only feel a gentle lengthening or release in the stretched tissue.  Ankle Pumps 1. While sitting with your knees straight, draw the top of your feet upwards by flexing your ankles.   2. Then, reverse the motion, pointing your toes downward.  3. Repeat 10 to 20 times. Complete this exercise 1 to 2 times per day.  Heel Slides 1. Lie on your back with both knees straight. (If this causes back discomfort, bend your opposite knee, placing your foot flat on the floor.)  2. Slowly slide your heel back toward your buttocks until you feel a gentle stretch in the front of your knee or thigh.  3. Slowly slide your heel back to the starting position.  4. Repeat 10 to 20 times. Complete this exercise 1 to 2 times per day.  Quadriceps Sets 1. Lie  on your back with your sore leg extended and your opposite knee bent.  2. Gradually tense the muscles in the front of your thigh. You should see either your kneecap slide up toward your hip or increased dimpling just above the knee. This motion will push the back of the knee down toward the floor.   3. Hold the muscle as tight as you can without increasing your pain for 10 seconds.  4. Relax the  muscles slowly and completely in between each repetition.  5. Repeat 10 to 20 times. Complete this exercise 1 to 2 times per day.  Short Arc Kicks 1. Lie on your back. Place a 4 to 6 inch towel roll under your sore knee so that the knee slightly bends.  2. Raise only your lower leg by tightening the muscles in the front of your thigh. Do not allow your thigh to rise.  3. Hold this position for 5 seconds.  4. Repeat 10 to 20 times. Complete this exercise 1 to 2 times per day.  Straight Leg Raises 1. Lie on your back with your sore leg extended and your opposite knee bent.   2. Tense the muscles in the front of your sore thigh. You should see either your kneecap slide up or increased dimpling just above the knee. Your thigh may even quiver.  3. Tighten these muscles even more and raise your leg 4 to 6 inches off the floor. Hold for 3 to 5 seconds.  4. Keeping these muscles tense, lower your leg.  5. Relax the muscles slowly and completely in between each repetition.  6. Repeat 10 to 20 times. Complete this exercise 1 to 2 times per day.  Arm Chair Push-ups 1. Find a firm, non-wheeled chair with solid armrests.  2. Sitting in the chair, extend your sore leg straight out in front of you.  3. Lift up your body weight, using your arms and opposite leg.  4. Slowly lower your body weight.   5. Repeat 10 to 20 times. Complete this exercise 1 to 2 times per day.  Document Released: 05/19/2010 Document Revised: 05/07/2011 Document Reviewed: 05/19/2010 [facility] Patient Information 2013 [place], [place].

## 2012-03-06 ENCOUNTER — Encounter: Payer: Self-pay | Admitting: Orthopedic Surgery

## 2012-03-17 ENCOUNTER — Encounter (HOSPITAL_COMMUNITY): Payer: Self-pay | Admitting: Pharmacy Technician

## 2012-03-19 ENCOUNTER — Telehealth: Payer: Self-pay | Admitting: Orthopedic Surgery

## 2012-03-19 NOTE — Telephone Encounter (Signed)
[facility], [phone], regarding pre-authorization for scheduled surgery, in-patient/admit 03/31/12, [facility]; CPT [phone], ICD9 715.96, 715.16.  Voice message left for pre-authorization department to return call. (Fol/up)

## 2012-03-20 NOTE — Telephone Encounter (Signed)
Follow up call to [facility] - spoke with care management representative [facility], regarding pre-authorization for surgery noted.  Received approval, pre-authorization # [id number], approved for 7 days, beginning w/admit date 03/31/12. States no clinicals needed at this time, however, if additional days needed, hospital will be contacted to provide update.

## 2012-03-24 ENCOUNTER — Encounter (HOSPITAL_COMMUNITY): Payer: Self-pay

## 2012-03-24 ENCOUNTER — Encounter (HOSPITAL_COMMUNITY)
Admission: RE | Admit: 2012-03-24 | Discharge: 2012-03-24 | Disposition: A | Discharge status: Home / Self Care | Payer: BC Managed Care – PPO | Source: Ambulatory Visit | Attending: Orthopedic Surgery | Admitting: Orthopedic Surgery

## 2012-03-24 LAB — BASIC METABOLIC PANEL
CO2: 28 mEq/L (ref 19–32)
Chloride: 102 mEq/L (ref 96–112)
GFR calc non Af Amer: 62 mL/min — ABNORMAL LOW (ref >90)
Glucose, Bld: 90 mg/dL (ref 70–99)
Potassium: 4 mEq/L (ref 3.5–5.1)
Sodium: 138 mEq/L (ref 135–145)

## 2012-03-24 LAB — CBC
HCT: 35.9 % — ABNORMAL LOW (ref 36.0–46.0)
Hemoglobin: 12.2 g/dL (ref 12.0–15.0)
MCHC: 34 g/dL (ref 30.0–36.0)
RBC: 4.07 MIL/uL (ref 3.87–5.11)
WBC: 6 10*3/uL (ref 4.0–10.5)

## 2012-03-24 LAB — PROTIME-INR: INR: 0.97 (ref 0.00–1.49)

## 2012-03-24 NOTE — Patient Instructions (Addendum)
Erica Graves  03/24/2012   Your procedure is scheduled on:  03/31/2012  Report to [facility] at  615  AM.  Call this number if you have problems the morning of surgery: [phone]   Remember:   Do not eat food or drink liquids after midnight.   Take these medicines the morning of surgery with A SIP OF WATER: none  Do not wear jewelry, make-up or nail polish.  Do not wear lotions, powders, or perfumes.   Do not shave 48 hours prior to surgery. Men may shave face and neck.  Do not bring valuables to the hospital.  Contacts, dentures or bridgework may not be worn into surgery.  Leave suitcase in the car. After surgery it may be brought to your room.  For patients admitted to the hospital, checkout time is 11:00 AM the day of discharge.   Patients discharged the day of surgery will not be allowed to drive  home.  Name and phone number of your driver: family  Special Instructions: Shower using CHG 2 nights before surgery and the night before surgery.  If you shower the day of surgery use CHG.  Use special wash - you have one bottle of CHG for all showers.  You should use approximately 1/3 of the bottle for each shower.   Please read over the following fact sheets that you were given: Pain Booklet, Coughing and Deep Breathing, Blood Transfusion Information, Lab Information, Total Joint Packet, MRSA Information, Surgical Site Infection Prevention, Anesthesia Post-op Instructions and Care and Recovery After Surgery Total Knee Replacement Total knee replacement is a procedure to replace your knee joint with an artificial knee joint (prosthetic knee joint). The purpose of this surgery is to reduce pain and improve your knee function. LET YOUR CAREGIVER KNOW ABOUT:   Any allergies you have.  Any medicines you are taking, including vitamins, herbs, eyedrops, over-the-counter medicines, and creams.  Any problems you have had with the use of anesthetics.  Family history of problems with  the use of anesthetics.  Any blood disorders you have, including bleeding problems or clotting problems.  Previous surgeries you have had. RISKS AND COMPLICATIONS  Generally, total knee replacement is a safe procedure. However, as with any surgical procedure, complications can occur. Possible complications associated with total knee replacement include:  Loss of range of motion of the knee or instability.  Loosening of the prosthesis.  Infection.  Persistent pain. BEFORE THE PROCEDURE   Your caregiver will instruct you when you need to stop eating and drinking.  Ask your caregiver if you need to change or stop any regular medicines. PROCEDURE  Just before the procedure you will receive medicine that will make you drowsy (sedative). This will be given through a tube that is inserted into one of your veins (intravenous [IV] tube). Then you will either receive medicine to block pain from the waist down through your legs (spinal block) or medicine to also receive medicine to make you fall asleep (general anesthetic). You may also receive medicine to block feeling in your leg (nerve block) to help ease pain after surgery. An incision will be made in your knee. Your surgeon will take out any damaged cartilage and bone by sawing off the damaged surfaces. Then the surgeon will put a new metal liner over the sawed off portion of your thigh bone (femur) and a plastic liner over the sawed off portion of one of the bones of your lower  leg (tibia). This is to restore alignment and function to your knee. A plastic piece is often used to restore the surface of your knee cap. AFTER THE PROCEDURE  You will be taken to the recovery area. You may have drainage tubes to drain excess fluid from your knee. These tubes attach to a device that removes these fluids. Once you are awake, stable, and taking fluids well, you will be taken to your hospital room. You will receive physical therapy as prescribed by your  caregiver. The length of your stay in the hospital after a knee replacement is 2 4 days. Your surgeon may recommend that you spend time (usually an additional 10 14 days) in an extended-care facility to help you begin walking again and improve your range of motion before you go home. You may also be prescribed blood-thinning medicine to decrease your risk of developing blood clots in your leg. Document Released: 05/21/2000 Document Revised: 08/14/2011 Document Reviewed: 03/25/2011 [facility] Patient Information 2013 [place], [place]. PATIENT INSTRUCTIONS POST-ANESTHESIA  IMMEDIATELY FOLLOWING SURGERY:  Do not drive or operate machinery for the first twenty four hours after surgery.  Do not make any important decisions for twenty four hours after surgery or while taking narcotic pain medications or sedatives.  If you develop intractable nausea and vomiting or a severe headache please notify your doctor immediately.  FOLLOW-UP:  Please make an appointment with your surgeon as instructed. You do not need to follow up with anesthesia unless specifically instructed to do so.  WOUND CARE INSTRUCTIONS (if applicable):  Keep a dry clean dressing on the anesthesia/puncture wound site if there is drainage.  Once the wound has quit draining you may leave it open to air.  Generally you should leave the bandage intact for twenty four hours unless there is drainage.  If the epidural site drains for more than 36-48 hours please call the anesthesia department.  QUESTIONS?:  Please feel free to call your physician or the hospital operator if you have any questions, and they will be happy to assist you.

## 2012-03-28 ENCOUNTER — Telehealth: Payer: Self-pay | Admitting: Orthopedic Surgery

## 2012-03-28 NOTE — Telephone Encounter (Signed)
Britta Mccreedy from [place] has called to ask for the H & P for patient's surgery Monday, 03/31/12.  I advised it was forthcoming if not already entered.

## 2012-03-30 NOTE — H&P (Signed)
TOTAL KNEE ADMISSION H&P  Patient is being admitted for left total knee arthroplasty.  Subjective:  Chief Complaint:left knee pain.  HPI: Erica Graves, 47 y.o. female, has a history of pain and functional disability in the left knee due to arthritis and has failed non-surgical conservative treatments for greater than 12 weeks to includeNSAID's and/or analgesics, corticosteriod injections, viscosupplementation injections, flexibility and strengthening excercises, supervised PT with diminished ADL's post treatment, use of assistive devices, weight reduction as appropriate and activity modification.  Onset of symptoms was gradual, starting > 5 years ago years ago with gradually worsening course since that time. The patient noted prior procedures on the knee to include  arthroscopy on the left knee(s).  Patient currently rates pain in the left knee(s) at 7 out of 10 with activity. Patient has night pain, worsening of pain with activity and weight bearing, pain that interferes with activities of daily living, pain with passive range of motion, crepitus and joint swelling.  Patient has evidence of subchondral sclerosis and joint space narrowing by imaging studies.  There is no active infection.  No current facility-administered medications for this encounter. No current outpatient prescriptions on file.  Patient Active Problem List   Diagnosis Date Noted  . Arthritis of knee, degenerative 03/05/2012  . Osteoarthritis of left knee 12/12/2011  . S/P arthroscopy of left knee 06/13/2011  . Lateral meniscus tear 06/13/2011  . Stiffness of knee joint 06/05/2011  . Medial meniscus, posterior horn derangement 05/16/2011  . DEGENERATIVE JOINT DISEASE, KNEES, BILATERAL 12/19/2009  . 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 FOOT PAIN, LEFT 07/22/2007  . DEPRESSION/ANXIETY 12/27/2006  . SHOULDER PAIN, LEFT 12/27/2006  . OVERWEIGHT 11/12/2006  . CONSTIPATION 11/12/2006  . Lumbago 11/12/2006   Past  Medical History  Diagnosis Date  . Obesity   . Chronic low back pain   . Depression with anxiety   . Constipation   . Kidney stone     Past Surgical History  Procedure Date  . Left knee arthroscopy 08/14/2005 Dr. Romeo Apple torn meniscus  . Knee arthroscopy   . Partial hysterectomy   . Lumbar spine surgery   . Back surgery   . Knee arthroscopy 05/21/2011    Procedure: ARTHROSCOPY KNEE;  Surgeon: Vickki Hearing, MD;  Location: [facility];  Service: Orthopedics;  Laterality: Left;  lateral menisectomy  . Abdominal hysterectomy     No prescriptions prior to admission   No Known Allergies  History  Substance Use Topics  . Smoking status: Never Smoker   . Smokeless tobacco: Not on file  . Alcohol Use: No    No family history on file.   Review of Systems  Constitutional: Negative.   HENT: Negative.   Eyes: Negative.   Respiratory: Negative.   Cardiovascular: Negative.   Gastrointestinal: Negative.   Genitourinary: Negative.   Musculoskeletal: Positive for joint pain.  Skin: Negative.   Neurological: Negative.   Endo/Heme/Allergies: Negative.   Psychiatric/Behavioral: Negative.     Objective:  Physical Exam  Nursing note and vitals reviewed. Constitutional: She is oriented to person, place, and time. She appears well-developed and well-nourished.  HENT:  Head: Normocephalic.  Eyes: Pupils are equal, round, and reactive to light.  Neck: Normal range of motion.  Cardiovascular: Normal rate and intact distal pulses.   Respiratory: Effort normal.  Musculoskeletal:       Upper extremity exam  The right and left upper extremity:  Inspection and palpation revealed no abnormalities in the upper extremities.  Range of motion  is full without contracture. Motor exam is normal with grade 5 strength. The joints are fully reduced without subluxation. There is no atrophy or tremor and muscle tone is normal.  All joints are stable.    Lower extremity exam  Ambulation is  normal.  The right lower extremity:  Inspection and palpation revealed no tenderness or abnormality in alignment in the lower extremities. Range of motion is full.  Strength is grade 5.  All joints are stable.    Lymphadenopathy:    She has no cervical adenopathy.  Neurological: She is alert and oriented to person, place, and time. She has normal reflexes.  Skin: Skin is warm and dry. No erythema.  Psychiatric: She has a normal mood and affect. Her behavior is normal. Judgment and thought content normal.   Left knee : medial joint tender ness no effusion. crepitance and pain with tenderness in the patellofemoral joint. ROM=125 Motor 5/5 Stability normal    Vital signs in last 24 hours:see nursing notes     Labs: see results section    Estimated Body mass index is 32.28 kg/(m^2) as calculated from the following:   Height as of 03/05/12: 5\' 6" (1.676 m).   Weight as of 03/05/12: 200 lb(90.719 kg).   Imaging Review Plain radiographs demonstrate moderate degenerative joint disease of the left knee(s). The overall alignment ismild varus. The bone quality appears to be excellent for age and reported activity level.  Assessment/Plan:  End stage arthritis, left knee   The patient history, physical examination, clinical judgment of the provider and imaging studies are consistent with end stage degenerative joint disease of the left knee(s) and total knee arthroplasty is deemed medically necessary. The treatment options including medical management, injection therapy arthroscopy and arthroplasty were discussed at length. The risks and benefits of total knee arthroplasty were presented and reviewed. The risks due to aseptic loosening, infection, stiffness, patella tracking problems, thromboembolic complications and other imponderables were discussed. The patient acknowledged the explanation, agreed to proceed with the plan and consent was signed. Patient is being admitted for inpatient  treatment for surgery, pain control, PT, OT, prophylactic antibiotics, VTE prophylaxis, progressive ambulation and ADL's and discharge planning. The patient is planning to be discharged home with home health services  PROCEDURE LEFT TKA

## 2012-03-31 ENCOUNTER — Encounter (HOSPITAL_COMMUNITY): Payer: Self-pay | Admitting: Anesthesiology

## 2012-03-31 ENCOUNTER — Encounter (HOSPITAL_COMMUNITY)
Admission: RE | Disposition: A | Discharge status: Home Health | Payer: Self-pay | Source: Ambulatory Visit | Attending: Orthopedic Surgery

## 2012-03-31 ENCOUNTER — Ambulatory Visit (HOSPITAL_COMMUNITY): Payer: BC Managed Care – PPO | Admitting: Anesthesiology

## 2012-03-31 ENCOUNTER — Encounter (HOSPITAL_COMMUNITY): Payer: Self-pay | Admitting: *Deleted

## 2012-03-31 ENCOUNTER — Encounter (HOSPITAL_COMMUNITY): Payer: Self-pay | Admitting: General Practice

## 2012-03-31 ENCOUNTER — Ambulatory Visit (HOSPITAL_COMMUNITY): Payer: BC Managed Care – PPO

## 2012-03-31 ENCOUNTER — Inpatient Hospital Stay (HOSPITAL_COMMUNITY)
Admission: RE | Admit: 2012-03-31 | Discharge: 2012-04-03 | DRG: 209 | Disposition: A | Discharge status: Home Health | Payer: BC Managed Care – PPO | Source: Ambulatory Visit | Attending: Orthopedic Surgery | Admitting: Orthopedic Surgery

## 2012-03-31 DIAGNOSIS — Z96652 Presence of left artificial knee joint: Secondary | ICD-10-CM

## 2012-03-31 DIAGNOSIS — K5900 Constipation, unspecified: Secondary | ICD-10-CM | POA: Diagnosis present

## 2012-03-31 DIAGNOSIS — M1710 Unilateral primary osteoarthritis, unspecified knee: Principal | ICD-10-CM | POA: Diagnosis present

## 2012-03-31 DIAGNOSIS — M179 Osteoarthritis of knee, unspecified: Secondary | ICD-10-CM

## 2012-03-31 DIAGNOSIS — IMO0002 Reserved for concepts with insufficient information to code with codable children: Secondary | ICD-10-CM

## 2012-03-31 DIAGNOSIS — E669 Obesity, unspecified: Secondary | ICD-10-CM | POA: Diagnosis present

## 2012-03-31 DIAGNOSIS — M25569 Pain in unspecified knee: Secondary | ICD-10-CM | POA: Diagnosis present

## 2012-03-31 DIAGNOSIS — F341 Dysthymic disorder: Secondary | ICD-10-CM | POA: Diagnosis present

## 2012-03-31 DIAGNOSIS — Z87442 Personal history of urinary calculi: Secondary | ICD-10-CM

## 2012-03-31 HISTORY — PX: TOTAL KNEE ARTHROPLASTY: SHX125

## 2012-03-31 LAB — ABO/RH: ABO/RH(D): O POS

## 2012-03-31 SURGERY — ARTHROPLASTY, KNEE, TOTAL
Anesthesia: Spinal | Site: Knee | Laterality: Left | Wound class: Clean

## 2012-03-31 MED ORDER — CEFAZOLIN SODIUM-DEXTROSE 2-3 GM-% IV SOLR: 2.0000 g | INTRAVENOUS | Status: DC

## 2012-03-31 MED ORDER — PREGABALIN 50 MG PO CAPS
50.0000 mg | ORAL_CAPSULE | Freq: Once | ORAL | Status: AC
  Administered 2012-03-31: 50 mg via ORAL

## 2012-03-31 MED ORDER — MENTHOL 3 MG MT LOZG: 1.0000 | LOZENGE | OROMUCOSAL | Status: DC | PRN

## 2012-03-31 MED ORDER — CHLORHEXIDINE GLUCONATE 4 % EX LIQD
60.0000 mL | Freq: Once | CUTANEOUS | Status: DC
Start: 2012-03-31 — End: 2012-03-31

## 2012-03-31 MED ORDER — FENTANYL CITRATE 0.05 MG/ML IJ SOLN
25.0000 ug | INTRAMUSCULAR | Status: DC | PRN
  Administered 2012-03-31 (×3): 50 ug via INTRAVENOUS

## 2012-03-31 MED ORDER — CELECOXIB 100 MG PO CAPS
400.0000 mg | ORAL_CAPSULE | Freq: Once | ORAL | Status: AC
  Administered 2012-03-31: 400 mg via ORAL

## 2012-03-31 MED ORDER — METHOCARBAMOL 100 MG/ML IJ SOLN
500.0000 mg | Freq: Four times a day (QID) | INTRAVENOUS | Status: DC
  Administered 2012-03-31 – 2012-04-03 (×10): 500 mg via INTRAVENOUS
  Filled 2012-03-31 (×13): qty 5

## 2012-03-31 MED ORDER — ACETAMINOPHEN 325 MG PO TABS: 650.0000 mg | ORAL_TABLET | Freq: Four times a day (QID) | ORAL | Status: DC | PRN

## 2012-03-31 MED ORDER — BUPIVACAINE IN DEXTROSE 0.75-8.25 % IT SOLN
INTRATHECAL | Status: AC
  Filled 2012-03-31: qty 2

## 2012-03-31 MED ORDER — ONDANSETRON HCL 4 MG/2ML IJ SOLN
4.0000 mg | Freq: Four times a day (QID) | INTRAMUSCULAR | Status: DC
  Administered 2012-03-31 – 2012-04-03 (×10): 4 mg via INTRAVENOUS
  Filled 2012-03-31 (×7): qty 2

## 2012-03-31 MED ORDER — OXYCODONE HCL 5 MG PO TABS
5.0000 mg | ORAL_TABLET | ORAL | Status: DC
  Administered 2012-03-31 – 2012-04-01 (×4): 5 mg via ORAL
  Filled 2012-03-31 (×4): qty 1

## 2012-03-31 MED ORDER — METHOCARBAMOL 100 MG/ML IJ SOLN
INTRAMUSCULAR | Status: AC
  Filled 2012-03-31: qty 10

## 2012-03-31 MED ORDER — ACETAMINOPHEN 10 MG/ML IV SOLN
1000.0000 mg | Freq: Once | INTRAVENOUS | Status: AC
  Administered 2012-03-31: 1000 mg via INTRAVENOUS

## 2012-03-31 MED ORDER — SENNA 8.6 MG PO TABS
1.0000 | ORAL_TABLET | Freq: Two times a day (BID) | ORAL | Status: DC
  Administered 2012-03-31 – 2012-04-03 (×7): 8.6 mg via ORAL
  Filled 2012-03-31 (×7): qty 1

## 2012-03-31 MED ORDER — BUPIVACAINE-EPINEPHRINE (PF) 0.5% -1:200000 IJ SOLN
INTRAMUSCULAR | Status: DC | PRN
  Administered 2012-03-31: 90 mL

## 2012-03-31 MED ORDER — GLYCOPYRROLATE 0.2 MG/ML IJ SOLN
INTRAMUSCULAR | Status: AC
  Filled 2012-03-31: qty 1

## 2012-03-31 MED ORDER — LIDOCAINE HCL (CARDIAC) 10 MG/ML IV SOLN
INTRAVENOUS | Status: DC | PRN
  Administered 2012-03-31: 50 mg via INTRAVENOUS

## 2012-03-31 MED ORDER — PROPOFOL 10 MG/ML IV EMUL
INTRAVENOUS | Status: AC
  Filled 2012-03-31: qty 20

## 2012-03-31 MED ORDER — MIDAZOLAM HCL 2 MG/2ML IJ SOLN
INTRAMUSCULAR | Status: AC
  Filled 2012-03-31: qty 2

## 2012-03-31 MED ORDER — ASPIRIN EC 325 MG PO TBEC
325.0000 mg | DELAYED_RELEASE_TABLET | Freq: Two times a day (BID) | ORAL | Status: DC
  Administered 2012-03-31 – 2012-04-03 (×7): 325 mg via ORAL
  Filled 2012-03-31 (×7): qty 1

## 2012-03-31 MED ORDER — FENTANYL CITRATE 0.05 MG/ML IJ SOLN
INTRAMUSCULAR | Status: AC
  Filled 2012-03-31: qty 2

## 2012-03-31 MED ORDER — CELECOXIB 100 MG PO CAPS
ORAL_CAPSULE | ORAL | Status: AC
  Filled 2012-03-31: qty 4

## 2012-03-31 MED ORDER — DIPHENHYDRAMINE HCL 12.5 MG/5ML PO ELIX: 12.5000 mg | ORAL_SOLUTION | ORAL | Status: DC | PRN

## 2012-03-31 MED ORDER — PREGABALIN 50 MG PO CAPS
50.0000 mg | ORAL_CAPSULE | Freq: Three times a day (TID) | ORAL | Status: DC
  Administered 2012-03-31 – 2012-04-03 (×9): 50 mg via ORAL
  Filled 2012-03-31 (×9): qty 1

## 2012-03-31 MED ORDER — CEFAZOLIN SODIUM 1-5 GM-% IV SOLN
1.0000 g | Freq: Four times a day (QID) | INTRAVENOUS | Status: DC
  Administered 2012-03-31: 1 g via INTRAVENOUS
  Filled 2012-03-31 (×2): qty 50

## 2012-03-31 MED ORDER — CEFAZOLIN SODIUM-DEXTROSE 2-3 GM-% IV SOLR
INTRAVENOUS | Status: AC
  Filled 2012-03-31: qty 50

## 2012-03-31 MED ORDER — 0.9 % SODIUM CHLORIDE (POUR BTL) OPTIME
TOPICAL | Status: DC | PRN
  Administered 2012-03-31: 1000 mL

## 2012-03-31 MED ORDER — CEFAZOLIN SODIUM 1-5 GM-% IV SOLN
1.0000 g | Freq: Once | INTRAVENOUS | Status: AC
  Administered 2012-03-31: 1 g via INTRAVENOUS
  Filled 2012-03-31: qty 50

## 2012-03-31 MED ORDER — ACETAMINOPHEN 650 MG RE SUPP: 650.0000 mg | Freq: Four times a day (QID) | RECTAL | Status: DC | PRN

## 2012-03-31 MED ORDER — ACETAMINOPHEN 10 MG/ML IV SOLN
1000.0000 mg | Freq: Four times a day (QID) | INTRAVENOUS | Status: AC
  Administered 2012-03-31 – 2012-04-01 (×3): 1000 mg via INTRAVENOUS
  Filled 2012-03-31 (×3): qty 100

## 2012-03-31 MED ORDER — ONDANSETRON HCL 4 MG/2ML IJ SOLN
INTRAMUSCULAR | Status: AC
  Filled 2012-03-31: qty 2

## 2012-03-31 MED ORDER — OXYCODONE HCL 5 MG PO TABS
5.0000 mg | ORAL_TABLET | Freq: Once | ORAL | Status: AC
  Administered 2012-03-31: 5 mg via ORAL

## 2012-03-31 MED ORDER — CEFAZOLIN SODIUM-DEXTROSE 2-3 GM-% IV SOLR
INTRAVENOUS | Status: DC | PRN
  Administered 2012-03-31: 2 g via INTRAVENOUS

## 2012-03-31 MED ORDER — PROPOFOL INFUSION 10 MG/ML OPTIME
INTRAVENOUS | Status: DC | PRN
  Administered 2012-03-31: 75 ug/kg/min via INTRAVENOUS
  Administered 2012-03-31: 50 ug/kg/min via INTRAVENOUS

## 2012-03-31 MED ORDER — ALUM & MAG HYDROXIDE-SIMETH 200-200-20 MG/5ML PO SUSP: 30.0000 mL | ORAL | Status: DC | PRN

## 2012-03-31 MED ORDER — MIDAZOLAM HCL 2 MG/2ML IJ SOLN
1.0000 mg | INTRAMUSCULAR | Status: DC | PRN
  Administered 2012-03-31: 2 mg via INTRAVENOUS

## 2012-03-31 MED ORDER — METHOCARBAMOL 100 MG/ML IJ SOLN
500.0000 mg | Freq: Once | INTRAMUSCULAR | Status: AC
  Administered 2012-03-31: 500 mg via INTRAVENOUS
  Filled 2012-03-31: qty 5

## 2012-03-31 MED ORDER — CELECOXIB 100 MG PO CAPS: 200.0000 mg | ORAL_CAPSULE | Freq: Every day | ORAL | Status: DC

## 2012-03-31 MED ORDER — BUPIVACAINE-EPINEPHRINE PF 0.5-1:200000 % IJ SOLN
INTRAMUSCULAR | Status: AC
  Filled 2012-03-31: qty 30

## 2012-03-31 MED ORDER — GLYCOPYRROLATE 0.2 MG/ML IJ SOLN
0.2000 mg | Freq: Once | INTRAMUSCULAR | Status: AC
  Administered 2012-03-31: 0.2 mg via INTRAVENOUS

## 2012-03-31 MED ORDER — FENTANYL CITRATE 0.05 MG/ML IJ SOLN
INTRAMUSCULAR | Status: AC
  Filled 2012-03-31: qty 5

## 2012-03-31 MED ORDER — PROPOFOL 10 MG/ML IV EMUL
INTRAVENOUS | Status: DC | PRN
  Administered 2012-03-31: 150 mg via INTRAVENOUS

## 2012-03-31 MED ORDER — BISACODYL 5 MG PO TBEC: 5.0000 mg | DELAYED_RELEASE_TABLET | Freq: Every day | ORAL | Status: DC | PRN

## 2012-03-31 MED ORDER — PHENOL 1.4 % MT LIQD: 1.0000 | OROMUCOSAL | Status: DC | PRN

## 2012-03-31 MED ORDER — OXYCODONE HCL 5 MG PO TABS
ORAL_TABLET | ORAL | Status: AC
  Filled 2012-03-31: qty 1

## 2012-03-31 MED ORDER — SUCCINYLCHOLINE CHLORIDE 20 MG/ML IJ SOLN
INTRAMUSCULAR | Status: AC
  Filled 2012-03-31: qty 1

## 2012-03-31 MED ORDER — SENNOSIDES-DOCUSATE SODIUM 8.6-50 MG PO TABS: 1.0000 | ORAL_TABLET | Freq: Every evening | ORAL | Status: DC | PRN

## 2012-03-31 MED ORDER — LIDOCAINE HCL (PF) 1 % IJ SOLN
INTRAMUSCULAR | Status: AC
  Filled 2012-03-31: qty 5

## 2012-03-31 MED ORDER — ONDANSETRON HCL 4 MG/2ML IJ SOLN: 4.0000 mg | Freq: Once | INTRAMUSCULAR | Status: DC | PRN

## 2012-03-31 MED ORDER — ONDANSETRON HCL 4 MG/2ML IJ SOLN
4.0000 mg | Freq: Once | INTRAMUSCULAR | Status: AC
  Administered 2012-03-31: 4 mg via INTRAVENOUS

## 2012-03-31 MED ORDER — ONDANSETRON HCL 4 MG/2ML IJ SOLN
4.0000 mg | Freq: Four times a day (QID) | INTRAMUSCULAR | Status: DC | PRN
  Administered 2012-03-31 – 2012-04-01 (×2): 4 mg via INTRAVENOUS
  Filled 2012-03-31 (×5): qty 2

## 2012-03-31 MED ORDER — DOCUSATE SODIUM 100 MG PO CAPS
100.0000 mg | ORAL_CAPSULE | Freq: Two times a day (BID) | ORAL | Status: DC
  Administered 2012-03-31 – 2012-04-03 (×6): 100 mg via ORAL
  Filled 2012-03-31 (×7): qty 1

## 2012-03-31 MED ORDER — FLEET ENEMA 7-19 GM/118ML RE ENEM: 1.0000 | ENEMA | Freq: Once | RECTAL | Status: AC | PRN

## 2012-03-31 MED ORDER — LACTATED RINGERS IV SOLN
INTRAVENOUS | Status: DC
  Administered 2012-03-31: 500 mL via INTRAVENOUS
  Administered 2012-03-31: 1000 mL via INTRAVENOUS

## 2012-03-31 MED ORDER — POTASSIUM CHLORIDE IN NACL 20-0.9 MEQ/L-% IV SOLN
INTRAVENOUS | Status: DC
  Administered 2012-03-31 – 2012-04-01 (×3): via INTRAVENOUS

## 2012-03-31 MED ORDER — ACETAMINOPHEN 10 MG/ML IV SOLN
INTRAVENOUS | Status: AC
  Filled 2012-03-31: qty 100

## 2012-03-31 MED ORDER — BUPIVACAINE IN DEXTROSE 0.75-8.25 % IT SOLN
INTRATHECAL | Status: DC | PRN
  Administered 2012-03-31: 15 mg via INTRATHECAL

## 2012-03-31 MED ORDER — HYDROMORPHONE HCL PF 1 MG/ML IJ SOLN
0.5000 mg | INTRAMUSCULAR | Status: DC | PRN
  Administered 2012-03-31 – 2012-04-01 (×4): 0.5 mg via INTRAVENOUS
  Filled 2012-03-31 (×4): qty 1

## 2012-03-31 MED ORDER — SUCCINYLCHOLINE CHLORIDE 20 MG/ML IJ SOLN
INTRAMUSCULAR | Status: DC | PRN
  Administered 2012-03-31: 100 mg via INTRAVENOUS

## 2012-03-31 MED ORDER — METOCLOPRAMIDE HCL 5 MG/ML IJ SOLN
5.0000 mg | Freq: Three times a day (TID) | INTRAMUSCULAR | Status: DC | PRN
  Administered 2012-03-31: 10 mg via INTRAVENOUS
  Filled 2012-03-31: qty 2

## 2012-03-31 MED ORDER — EPHEDRINE SULFATE 50 MG/ML IJ SOLN
INTRAMUSCULAR | Status: AC
  Filled 2012-03-31: qty 1

## 2012-03-31 MED ORDER — LACTATED RINGERS IV SOLN
INTRAVENOUS | Status: DC | PRN
  Administered 2012-03-31 (×2): via INTRAVENOUS

## 2012-03-31 MED ORDER — FENTANYL CITRATE 0.05 MG/ML IJ SOLN
INTRAMUSCULAR | Status: DC | PRN
  Administered 2012-03-31: 100 ug via INTRAVENOUS
  Administered 2012-03-31: 50 ug via INTRAVENOUS
  Administered 2012-03-31: 25 ug via INTRATHECAL
  Administered 2012-03-31: 50 ug via INTRAVENOUS
  Administered 2012-03-31: 75 ug via INTRAVENOUS
  Administered 2012-03-31: 50 ug via INTRAVENOUS
  Administered 2012-03-31: 100 ug via INTRAVENOUS
  Administered 2012-03-31: 50 ug via INTRAVENOUS

## 2012-03-31 MED ORDER — SODIUM CHLORIDE 0.9 % IR SOLN
Status: DC | PRN
  Administered 2012-03-31: 3000 mL

## 2012-03-31 MED ORDER — PREGABALIN 50 MG PO CAPS
ORAL_CAPSULE | ORAL | Status: AC
  Filled 2012-03-31: qty 1

## 2012-03-31 SURGICAL SUPPLY — 75 items
BAG HAMPER (MISCELLANEOUS) ×2 IMPLANT
BANDAGE ESMARK 6X9 LF (GAUZE/BANDAGES/DRESSINGS) ×1 IMPLANT
BIT DRILL 3.2X128 (BIT) ×1 IMPLANT
BLADE HEX COATED 2.75 (ELECTRODE) ×2 IMPLANT
BLADE SAG 18X100X1.27 (BLADE) ×1 IMPLANT
BLADE SAGITTAL 25.0X1.27X90 (BLADE) ×2 IMPLANT
BLADE SAW SAG 90X13X1.27 (BLADE) ×2 IMPLANT
BLADE SURG SZ10 CARB STEEL (BLADE) IMPLANT
BNDG CMPR 9X6 STRL LF SNTH (GAUZE/BANDAGES/DRESSINGS) ×1
BNDG ESMARK 6X9 LF (GAUZE/BANDAGES/DRESSINGS) ×2
BOWL SMART MIX CTS (DISPOSABLE) IMPLANT
CEMENT HV SMART SET (Cement) ×4 IMPLANT
CLOTH BEACON ORANGE TIMEOUT ST (SAFETY) ×2 IMPLANT
COVER LIGHT HANDLE STERIS (MISCELLANEOUS) ×4 IMPLANT
COVER PROBE W GEL 5X96 (DRAPES) ×2 IMPLANT
CUFF TOURNIQUET SINGLE 34IN LL (TOURNIQUET CUFF) ×1 IMPLANT
CUFF TOURNIQUET SINGLE 44IN (TOURNIQUET CUFF) IMPLANT
DECANTER SPIKE VIAL GLASS SM (MISCELLANEOUS) ×5 IMPLANT
DRAPE BACK TABLE (DRAPES) ×2 IMPLANT
DRAPE EXTREMITY T 121X128X90 (DRAPE) ×2 IMPLANT
DRAPE INCISE IOBAN 44X35 STRL (DRAPES) ×2 IMPLANT
DRAPE U-SHAPE 47X51 STRL (DRAPES) ×1 IMPLANT
DRSG MEPILEX BORDER 4X12 (GAUZE/BANDAGES/DRESSINGS) ×2 IMPLANT
DURAPREP 26ML APPLICATOR (WOUND CARE) ×4 IMPLANT
ELECT REM PT RETURN 9FT ADLT (ELECTROSURGICAL) ×2
ELECTRODE REM PT RTRN 9FT ADLT (ELECTROSURGICAL) ×1 IMPLANT
EVACUATOR 3/16  PVC DRAIN (DRAIN) ×1
EVACUATOR 3/16 PVC DRAIN (DRAIN) ×1 IMPLANT
FACESHIELD LNG OPTICON STERILE (SAFETY) ×1 IMPLANT
GLOVE BIOGEL PI IND STRL 7.0 (GLOVE) IMPLANT
GLOVE BIOGEL PI IND STRL 8 (GLOVE) IMPLANT
GLOVE BIOGEL PI INDICATOR 7.0 (GLOVE) ×2
GLOVE BIOGEL PI INDICATOR 8 (GLOVE) ×1
GLOVE EXAM NITRILE MD LF STRL (GLOVE) ×1 IMPLANT
GLOVE OPTIFIT SS 8.0 STRL (GLOVE) ×1 IMPLANT
GLOVE SKINSENSE NS SZ8.0 LF (GLOVE) ×2
GLOVE SKINSENSE STRL SZ8.0 LF (GLOVE) ×2 IMPLANT
GLOVE SS BIOGEL STRL SZ 6.5 (GLOVE) IMPLANT
GLOVE SS BIOGEL STRL SZ 8 (GLOVE) IMPLANT
GLOVE SS N UNI LF 8.5 STRL (GLOVE) ×2 IMPLANT
GLOVE SUPERSENSE BIOGEL SZ 6.5 (GLOVE) ×3
GLOVE SUPERSENSE BIOGEL SZ 8 (GLOVE) ×2
GOWN STRL REIN XL XLG (GOWN DISPOSABLE) ×8 IMPLANT
HANDPIECE INTERPULSE COAX TIP (DISPOSABLE) ×2
HOOD W/PEELAWAY (MISCELLANEOUS) ×8 IMPLANT
INST SET MAJOR BONE (KITS) ×2 IMPLANT
IV NS IRRIG 3000ML ARTHROMATIC (IV SOLUTION) ×2 IMPLANT
KIT BLADEGUARD II DBL (SET/KITS/TRAYS/PACK) ×2 IMPLANT
KIT ROOM TURNOVER APOR (KITS) ×2 IMPLANT
MANIFOLD NEPTUNE II (INSTRUMENTS) ×2 IMPLANT
MARKER SKIN DUAL TIP RULER LAB (MISCELLANEOUS) ×2 IMPLANT
NDL HYPO 21X1.5 SAFETY (NEEDLE) ×2 IMPLANT
NEEDLE HYPO 21X1.5 SAFETY (NEEDLE) ×4 IMPLANT
NS IRRIG 1000ML POUR BTL (IV SOLUTION) ×2 IMPLANT
PACK TOTAL JOINT (CUSTOM PROCEDURE TRAY) ×2 IMPLANT
PAD ARMBOARD 7.5X6 YLW CONV (MISCELLANEOUS) ×2 IMPLANT
PAD DANNIFLEX CPM (ORTHOPEDIC SUPPLIES) ×2 IMPLANT
PIN TROCAR 3 INCH (PIN) ×2 IMPLANT
SET BASIN LINEN APH (SET/KITS/TRAYS/PACK) ×2 IMPLANT
SET HNDPC FAN SPRY TIP SCT (DISPOSABLE) ×1 IMPLANT
SPONGE DRAIN TRACH 4X4 STRL 2S (GAUZE/BANDAGES/DRESSINGS) ×1 IMPLANT
SPONGE GAUZE 4X4 12PLY (GAUZE/BANDAGES/DRESSINGS) IMPLANT
STAPLER VISISTAT 35W (STAPLE) ×2 IMPLANT
SUT BRALON NAB BRD #1 30IN (SUTURE) ×2 IMPLANT
SUT MON AB 0 CT1 (SUTURE) ×2 IMPLANT
SUT MON AB 2-0 CT1 36 (SUTURE) IMPLANT
SYR 20CC LL (SYRINGE) ×1 IMPLANT
SYR 30ML LL (SYRINGE) ×2 IMPLANT
SYR BULB IRRIGATION 50ML (SYRINGE) ×2 IMPLANT
TAPE CLOTH SURG 4X10 WHT LF (GAUZE/BANDAGES/DRESSINGS) ×1 IMPLANT
TOWEL OR 17X26 4PK STRL BLUE (TOWEL DISPOSABLE) ×2 IMPLANT
TOWER CARTRIDGE SMART MIX (DISPOSABLE) ×2 IMPLANT
TRAY FOLEY CATH 14FR (SET/KITS/TRAYS/PACK) ×2 IMPLANT
WATER STERILE IRR 1000ML POUR (IV SOLUTION) ×8 IMPLANT
YANKAUER SUCT 12FT TUBE ARGYLE (SUCTIONS) ×2 IMPLANT

## 2012-03-31 NOTE — Anesthesia Postprocedure Evaluation (Signed)
  Anesthesia Post-op Note  Patient: Erica Graves  Procedure(s) Performed: Procedure(s) (LRB) with comments: TOTAL KNEE ARTHROPLASTY (Left) - Left Total Knee Arthroplasty  Patient Location: PACU  Anesthesia Type: General  Level of Consciousness: awake, alert , oriented and patient cooperative  Airway and Oxygen Therapy: Patient Spontanous Breathing and Patient connected to face mask oxygen  Post-op Pain: mild  Post-op Assessment: Post-op Vital signs reviewed, Patient's Cardiovascular Status Stable, Respiratory Function Stable, Patent Airway and No signs of Nausea or vomiting  Post-op Vital Signs: Reviewed and stable  Complications: No apparent anesthesia complications

## 2012-03-31 NOTE — Clinical Social Work Note (Signed)
CSW received referral for possible SNF. Pt evaluated by PT today and recommendation is for home health. CSW signing off but can be reconsulted if needed.   Derenda Fennel, [place] [phone]

## 2012-03-31 NOTE — Progress Notes (Signed)
Pt stated she felt a little nausea.  Gave Zofran.  Pt stated relief.

## 2012-03-31 NOTE — Brief Op Note (Addendum)
03/31/2012  10:03 AM  PATIENT:  Erica Graves  47 y.o. female  PRE-OPERATIVE DIAGNOSIS:  arthritis left knee  POST-OPERATIVE DIAGNOSIS:  arthritis left knee  PROCEDURE:  Procedure(s) (LRB) with comments: TOTAL KNEE ARTHROPLASTY (Left) - Left Total Knee Arthroplasty  depuy 58F 2.5T 10 poly 38 patella  SURGEON:  Surgeon(s) and Role:    * Vickki Hearing, MD - Primary  PHYSICIAN ASSISTANT:   ASSISTANTS: debbie dallas and wayne mcfatter    ANESTHESIA:   spinal and general  EBL:  Total I/O In: 1300 [I.V.:1300] Out: 500 [Urine:500]  BLOOD ADMINISTERED:none  DRAINS: intra-articular large hemovac (6 holes)   LOCAL MEDICATIONS USED:  MARCAINE    and Amount: 90 with epi  ml  SPECIMEN:  No Specimen  DISPOSITION OF SPECIMEN:  N/A  COUNTS:  YES  TOURNIQUET:   Total Tourniquet Time Documented: Thigh (Left) - 81 minutes  DICTATION: .Reubin Milan Dictation  PLAN OF CARE: Admit to inpatient   PATIENT DISPOSITION:  PACU - hemodynamically stable.   Delay start of Pharmacological VTE agent (>24hrs) due to surgical blood loss or risk of bleeding: yes

## 2012-03-31 NOTE — Interval H&P Note (Signed)
History and Physical Interval Note:  03/31/2012 7:28 AM  Erica Graves  has presented today for surgery, with the diagnosis of arthritis left knee  The various methods of treatment have been discussed with the patient and family. After consideration of risks, benefits and other options for treatment, the patient has consented to  Procedure(s) (LRB) with comments: TOTAL KNEE ARTHROPLASTY (Left) - DePuy as a surgical intervention .  The patient's history has been reviewed, patient examined, no change in status, stable for surgery.  I have reviewed the patient's chart and labs.  Questions were answered to the patient's satisfaction.     Fuller Canada

## 2012-03-31 NOTE — Transfer of Care (Signed)
  Anesthesia Post-op Note  Patient: Erica Graves  Procedure(s) Performed: Procedure(s) (LRB) with comments: TOTAL KNEE ARTHROPLASTY (Left) - Left Total Knee Arthroplasty  Patient Location: PACU  Anesthesia Type: General  Level of Consciousness: awake, alert , oriented and patient cooperative  Airway and Oxygen Therapy: Patient Spontanous Breathing and Patient connected to face mask oxygen  Post-op Pain: mild  Post-op Assessment: Post-op Vital signs reviewed, Patient's Cardiovascular Status Stable, Respiratory Function Stable, Patent Airway and No signs of Nausea or vomiting  Post-op Vital Signs: Reviewed and stable  Complications: No apparent anesthesia complications  

## 2012-03-31 NOTE — Anesthesia Preprocedure Evaluation (Signed)
Anesthesia Evaluation  Patient identified by MRN, date of birth, ID band Patient awake    Reviewed: Allergy & Precautions, H&P , NPO status , Patient's Chart, lab work & pertinent test results  History of Anesthesia Complications Negative for: history of anesthetic complications  Airway Mallampati: II      Dental  (+) Teeth Intact   Pulmonary  breath sounds clear to auscultation        Cardiovascular negative cardio ROS  Rhythm:Regular     Neuro/Psych PSYCHIATRIC DISORDERS Depression    GI/Hepatic negative GI ROS, Neg liver ROS,   Endo/Other  negative endocrine ROS  Renal/GU Renal disease     Musculoskeletal   Abdominal (+) + obese,  Abdomen: soft.    Peds  Hematology negative hematology ROS (+)   Anesthesia Other Findings   Reproductive/Obstetrics                           Anesthesia Physical Anesthesia Plan  ASA: I  Anesthesia Plan: Spinal   Post-op Pain Management:    Induction: Intravenous  Airway Management Planned: Simple Face Mask  Additional Equipment:   Intra-op Plan:   Post-operative Plan:   Informed Consent: I have reviewed the patients History and Physical, chart, labs and discussed the procedure including the risks, benefits and alternatives for the proposed anesthesia with the patient or authorized representative who has indicated his/her understanding and acceptance.     Plan Discussed with: CRNA  Anesthesia Plan Comments:         Anesthesia Quick Evaluation

## 2012-03-31 NOTE — Op Note (Signed)
03/31/2012  10:03 AM  PATIENT:  Erica Graves  47 y.o. female  PRE-OPERATIVE DIAGNOSIS:  arthritis left knee  POST-OPERATIVE DIAGNOSIS:  arthritis left knee  PROCEDURE:  Procedure(s) (LRB) with comments: TOTAL KNEE ARTHROPLASTY (Left) - Left Total Knee Arthroplasty  Operative findings grade 4 wear of the medial compartment minimal osteophytes and grade 3 chondral changes of the patella  Details of procedure  Indications for procedure disabling knee pain, failure to control pain with nonoperative measures  Details of procedure:  In the preop area the patient's LEFT  knee was marked and countersigned by the surgeon, the chart was updated, consent was signed  The patient was taken to the operating room for spinal anesthetic followed by administering 2 g of Ancef based on weight of >80 kg  A Foley catheter was inserted sterilely, then the operative extremity [LEFT LOWER] was prepped and draped sterilely  The timeout was completed  The limb was then exsanguinated with a six-inch Esmarch with the knee in flexion and the tourniquet was elevated to 300 mm of mercury. A midline incision was made, the subcutaneous tissues were divided down to the extensor mechanism. A medial arthrotomy was performed, the patella was everted,  the fat pad was resected. The medial and  lateral menisci were resected. The medial soft tissue sleeve was elevated to the mid coronal plane. The anterior cruciate ligament and PCL were resected. Osteophytes were removed. The distal femur anterior surface was skeletonized with sharp dissection.  A three-eighths inch drill bit was used to enter the femoral canal which was suctioned and irrigated until the fluid was clear;  an intramedullary rod was placed in the femur with a 5 , LEFT,  setting, the block was pinned in place; then an 10mm distal femoral resection was performed. The cut was checked for flatness.  The sizing guide was then placed on the femur, the femur  measured a size 3; the block was pinned in external rotation 3 using the epicondyles as reference; a  4-in-1 cutting block was placed and the 4 cuts were made with retractors protecting the collateral ligaments. The posterior osteophytes were removed with a curved osteotome. Residual PCL tissue was resected; residual meniscal tissue was resected.  The external tibial alignment instrument was set for tibial resection. The guide was   placed referencing the medial side which was the worn side and the stylus was set at 2 mm resection. Anterior slope was built in to match the patients anatomy; a neutral varus valgus cut was set using the medial third of the tibial tubercle as reference along with the  tibial spine. The guide was stabilized with pins.  A saw was used to resect the anterior tibia. The tibia was sized to a size 2.5 base plate.  We then placed spacer blocks using a  10 and a 12.5 spacer block; followed by the use of a laminar spreader, the knee was balanced  in extension and was balanced in flexion with the 10 spacer block   The box cut was then done using the box cutting guide. We then turned our attention to the patella.  The patella measured 24 mm we set the guide to leave 16 mm of patella.  After resection of the patella,  It was remeasured, and measured 13 mm. The size was 38. We then drilled the 3 peg holes.  We then did a trial reduction. The trial reduction was excellent with full extension, balanced in extension, balance in flexion. Passive  flexion equalled 115, patella normal tracking.   We then punched the tibia per technique.   The bone was then irrigated and dried while cement was mixed on the back table. The implants were checked for accuracy and then cemented in place; excess cement was removed; the cement was allowed to cure. The wound was then irrigated with copious amounts of saline, the posterior capsule was injected with 30 cc of Marcaine with epinephrine followed by 30 cc in  the soft tissue. The 10 insert was placed. Range of motion matched trial reduction  A Hemovac drain was placed in the joint.  The capsule was closed with #1 Bralon in interrupted and running fashion and then the joint was injected with 30 cc of Marcaine with epinephrine.  The subcutaneous tissues were closed with  0 Monocryl  in running fashion  Staples were used to reapproximate the skin edges.  Sterile dressings were applied. A radiograph was obtained.   The patient was then taken to the recovery room in stable condition.  Routine postop plan for knee replacement.  Implants used: depuy 36F 2.5T 10 poly 38 patella, sigma fixed bearing PS   SURGEON:  Surgeon(s) and Role:    * Vickki Hearing, MD - Primary  PHYSICIAN ASSISTANT:   ASSISTANTS: debbie dallas and wayne mcfatter    ANESTHESIA:   spinal and general  EBL:  Total I/O In: 1300 [I.V.:1300] Out: 500 [Urine:500]  BLOOD ADMINISTERED:none  DRAINS: intra-articular large hemovac (6 holes)   LOCAL MEDICATIONS USED:  MARCAINE    and Amount: 90 with epi  ml  SPECIMEN:  No Specimen  DISPOSITION OF SPECIMEN:  N/A  COUNTS:  YES  TOURNIQUET:   Total Tourniquet Time Documented: Thigh (Left) - 81 minutes  DICTATION: .Reubin Milan Dictation  PLAN OF CARE: Admit to inpatient   PATIENT DISPOSITION:  PACU - hemodynamically stable.   Delay start of Pharmacological VTE agent (>24hrs) due to surgical blood loss or risk of bleeding: yes

## 2012-03-31 NOTE — Evaluation (Signed)
Physical Therapy Evaluation Patient Details Name: Erica Graves MRN: [id number] DOB: [birth year] Today's Date: 03/31/2012 Time: 4098-1191 PT Time Calculation (min): 41 min  PT Assessment / Plan / Recommendation Clinical Impression  Pt is seen post op for initial PT visit.  She is quite drowsy and is unable to stay awake, however we were able to begin some teaching of pt and her husband as well as initiating ther ex to L knee per protocol.  She had 2-48 deg AA L knee ROM.  She should progress well through the protocol and should be able to go home at discharge.    PT Assessment  Patient needs continued PT services    Follow Up Recommendations  Home health PT    Does the patient have the potential to tolerate intense rehabilitation      Barriers to Discharge None      Equipment Recommendations  Rolling walker with 5" wheels (may decide to use crutches.Marland KitchenMarland Kitchenprobably will need BSC)    Recommendations for Other Services OT consult   Frequency 7X/week    Precautions / Restrictions Precautions Precautions: Fall Restrictions Weight Bearing Restrictions: No   Pertinent Vitals/Pain       Mobility  Bed Mobility Bed Mobility: Not assessed Details for Bed Mobility Assistance: pt is too drowsy to attempt to get OOB    Exercises Total Joint Exercises Ankle Circles/Pumps: AROM;Both;10 reps;Supine Quad Sets: AROM;Both;10 reps;Supine Short Arc Quad: AAROM;Left;10 reps;Supine Heel Slides: AAROM;Left;10 reps;Supine Goniometric ROM: 2-48 deg, AA   PT Diagnosis: Difficulty walking;Acute pain  PT Problem List: Decreased strength;Decreased range of motion;Decreased activity tolerance;Decreased mobility;Decreased knowledge of use of DME;Decreased knowledge of precautions;Pain PT Treatment Interventions: DME instruction;Gait training;Stair training;Functional mobility training;Therapeutic activities;Therapeutic exercise;Patient/family education   PT Goals Acute Rehab PT Goals PT Goal  Formulation: With patient/family Potential to Achieve Goals: Good Pt will go Supine/Side to Sit: with min assist;with HOB 0 degrees PT Goal: Supine/Side to Sit - Progress: Goal set today Pt will go Sit to Supine/Side: with min assist;with HOB 0 degrees Pt will go Sit to Stand: with supervision;with upper extremity assist PT Goal: Sit to Stand - Progress: Goal set today Pt will go Stand to Sit: with supervision;with upper extremity assist PT Goal: Stand to Sit - Progress: Goal set today Pt will Ambulate: 16 - 50 feet;with supervision;with rolling walker;with crutches (pt to decide...she is proficient with crutches) PT Goal: Ambulate - Progress: Goal set today Pt will Go Up / Down Stairs: 1-2 stairs;with min assist;with crutches;with rolling walker PT Goal: Up/Down Stairs - Progress: Goal set today  Visit Information  Last PT Received On: 03/31/12    Subjective Data  Subjective: my knee is beginning to hurt again Patient Stated Goal: free of knee pain   Prior Functioning  Home Living Lives With: Spouse Available Help at Discharge: Available 24 hours/day Type of Home: House Home Access: Stairs to enter [employer] of Steps: 2 Entrance Stairs-Rails: None Home Layout: One level Home Adaptive Equipment: Crutches Prior Function Level of Independence: Independent Able to Take Stairs?: Yes Driving: Yes 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, 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 difficulties    Cognition  Cognition Overall Cognitive Status: Appears within functional limits for tasks assessed/performed Arousal/Alertness: Lethargic Orientation Level: Appears intact for tasks assessed Behavior During Session: Lethargic    Extremity/Trunk Assessment Right Lower Extremity Assessment RLE ROM/Strength/Tone: Within functional levels RLE Sensation: WFL - Light Touch RLE Coordination: WFL - gross motor Left Lower Extremity Assessment LLE  ROM/Strength/Tone: Deficits  LLE ROM/Strength/Tone Deficits: ROM of knee =2-48 deg, strength of quad =2-/5...mod edema of knee   Balance    End of Session PT - End of Session Equipment Utilized During Treatment: Gait belt Patient left: in bed;with call bell/phone within reach;with family/visitor present  GP     Konrad Penta 03/31/2012, 2:38 PM

## 2012-03-31 NOTE — Anesthesia Procedure Notes (Addendum)
Spinal  Patient location during procedure: OR Start time: 03/31/2012 8:00 AM Staffing CRNA/Resident: ANDRAZA, Kline Bulthuis L Preanesthetic Checklist Completed: patient identified, site marked, surgical consent, pre-op evaluation, timeout performed, IV checked, risks and benefits discussed and monitors and equipment checked Spinal Block Patient position: left lateral decubitus Prep: Betadine Patient monitoring: heart rate, cardiac monitor, continuous pulse ox and blood pressure Approach: left paramedian Location: L3-4 Injection technique: single-shot Needle Needle type: Spinocan  Needle gauge: 22 G Needle length: 9 cm Assessment Events: failed spinal Additional Notes  ATTEMPTS:2 TRAY ZO:[id number] TRAY EXPIRATION: 01/2013 Patient remained in left lateral position for 4 minutes after block placed; turned supine; however patient was uncomfortable with foley placement and was able to feel tourniquet being placed on left thigh.  Dr. Tollie Eth notified.  Decision made to convert to general anesthesia.  Discussed with patient.  Procedure Name: Intubation Date/Time: 03/31/2012 8:14 AM Performed by: Carolyne Littles, Brynnley Dayrit L Pre-anesthesia Checklist: Patient identified, Patient being monitored, Timeout performed, Emergency Drugs available and Suction available Patient Re-evaluated:Patient Re-evaluated prior to inductionOxygen Delivery Method: Circle System Utilized Preoxygenation: Pre-oxygenation with 100% oxygen Intubation Type: IV induction Ventilation: Mask ventilation without difficulty Laryngoscope Size: 3 and Miller Grade View: Grade I Tube type: Oral Tube size: 7.0 mm Number of attempts: 1 Airway Equipment and Method: stylet Placement Confirmation: ETT inserted through vocal cords under direct vision,  positive ETCO2 and breath sounds checked- equal and bilateral Secured at: 21 cm Tube secured with: Tape Dental Injury: Teeth and Oropharynx as per pre-operative assessment     Date/Time: 03/31/2012  7:37 AM Performed by: Carolyne Littles, Zaylan Kissoon L Oxygen Delivery Method: Non-rebreather mask

## 2012-03-31 NOTE — Progress Notes (Signed)
Administered pt's PRN Dilaudid.  Pt vomited a few minutes later.  Notified Dr. Romeo Apple.  He stated to administer Zofran as scheduled and other PRN nausea medications as needed.  Continue to monitor.  Will notify night shift nurse to administer anti-nausea medications with PRN pain medications.  Pt states relief.

## 2012-04-01 LAB — CBC
HCT: 32.6 % — ABNORMAL LOW (ref 36.0–46.0)
Hemoglobin: 11.1 g/dL — ABNORMAL LOW (ref 12.0–15.0)
MCH: 30.2 pg (ref 26.0–34.0)
MCHC: 34 g/dL (ref 30.0–36.0)
MCV: 88.8 fL (ref 78.0–100.0)
Platelets: 177 10*3/uL (ref 150–400)
RBC: 3.67 MIL/uL — ABNORMAL LOW (ref 3.87–5.11)
RDW: 12.7 % (ref 11.5–15.5)
WBC: 7.4 10*3/uL (ref 4.0–10.5)

## 2012-04-01 LAB — BASIC METABOLIC PANEL
BUN: 8 mg/dL (ref 6–23)
CO2: 26 mEq/L (ref 19–32)
Calcium: 8.9 mg/dL (ref 8.4–10.5)
Chloride: 103 mEq/L (ref 96–112)
Creatinine, Ser: 0.7 mg/dL (ref 0.50–1.10)
Glucose, Bld: 125 mg/dL — ABNORMAL HIGH (ref 70–99)

## 2012-04-01 MED ORDER — KETOROLAC TROMETHAMINE 30 MG/ML IJ SOLN
30.0000 mg | Freq: Four times a day (QID) | INTRAMUSCULAR | Status: AC
  Administered 2012-04-01 – 2012-04-03 (×8): 30 mg via INTRAVENOUS
  Filled 2012-04-01 (×8): qty 1

## 2012-04-01 MED ORDER — OXYCODONE-ACETAMINOPHEN 5-325 MG PO TABS
1.0000 | ORAL_TABLET | ORAL | Status: DC
  Administered 2012-04-01 – 2012-04-03 (×11): 1 via ORAL
  Filled 2012-04-01 (×11): qty 1

## 2012-04-01 MED ORDER — PROMETHAZINE HCL 12.5 MG PO TABS
12.5000 mg | ORAL_TABLET | ORAL | Status: DC
  Administered 2012-04-01 – 2012-04-03 (×11): 12.5 mg via ORAL
  Filled 2012-04-01 (×11): qty 1

## 2012-04-01 NOTE — Plan of Care (Signed)
Problem: Phase II Progression Outcomes Goal: Ambulates Outcome: Completed/Met Date Met:  04/01/12 Pt ambulated to nurses' station and back with PT today using a front wheel walker.  Pt tolerated well.

## 2012-04-01 NOTE — Progress Notes (Signed)
Physical Therapy Treatment Patient Details Name: Erica Graves MRN: [id number] DOB: Jul 21, 1965 Today's Date: 04/01/2012 Time: 0826-0900 PT Time Calculation (min): 34 min  PT Assessment / Plan / Recommendation Comments on Treatment Session  Pt. with overall mobility, transfer and gait ability.  Pt. able to ambulate 30' this am without complaint or difficulty using RW.  Pt. with AROM of 5-50 degrees today.  Will begin CPM when returns to supine today.    Follow Up Recommendations  Home health PT           Equipment Recommendations   (may decide to use crutches.Marland KitchenMarland Kitchenprobably will need [facility])          Plan Discharge plan remains appropriate    Precautions / Restrictions Precautions Precautions: Fall        Mobility  Bed Mobility Bed Mobility: Supine to Sit Supine to Sit: 6: Modified independent (Device/Increase time) Details for Bed Mobility Assistance: Pt. able to transfer independently, however needed assistance to  guard and move L LE  Transfers Transfers: Sit to Stand;Stand to Sit Sit to Stand: 6: Modified independent (Device/Increase time) Stand to Sit: 6: Modified independent (Device/Increase time) Ambulation/Gait Ambulation/Gait Assistance: 4: Min guard Ambulation Distance (Feet): 30 Feet (15' X 2 ) Assistive device: Rolling walker Ambulation/Gait Assistance Details: Pt able to stand without assistance, ambulate to [facility] to void and ambulate back to recliner.  Overall with good mobility. Gait Pattern: Step-to pattern;Decreased stance time - left;Antalgic    Exercises Total Joint Exercises Ankle Circles/Pumps: AROM;Both;10 reps;Supine Quad Sets: AROM;Both;10 reps;Supine Short Arc Quad: AAROM;Left;10 reps;Supine Heel Slides: AAROM;Left;10 reps;Supine Goniometric ROM: 5-50 degrees, AAROM   PT Goals    Visit Information  Last PT Received On: 04/01/12 Assistance Needed: +1    Subjective Data  Subjective: Pt. states her knee is burning and a little stiff this morning  but not alot of pain   Cognition  Cognition Overall Cognitive Status: Appears within functional limits for tasks assessed/performed Arousal/Alertness: Awake/alert Orientation Level: Appears intact for tasks assessed Behavior During Session: [facility] for tasks performed       End of Session PT - End of Session Equipment Utilized During Treatment: Gait belt Activity Tolerance: Patient tolerated treatment well Patient left: with call bell/phone within reach;with family/visitor present;in chair     Lurena Nida, PTA/CLT 04/01/2012, 9:53 AM

## 2012-04-01 NOTE — Evaluation (Signed)
Occupational Therapy Evaluation Patient Details Name: Erica Graves MRN: [id number] DOB: 02/20/1966 Today's Date: 04/01/2012 Time: 7829-5621 OT Time Calculation (min): 23 min  OT Assessment / Plan / Recommendation Clinical Impression  Patient is a 47 y/o female s/p left TKR presenting to acute OT with deficits below. Patient will benefit from Home Health OT to work on ADL performance, functional transfers, and Bil UE strength and endurance.     OT Assessment  All further OT needs can be met in the next venue of care    Follow Up Recommendations  Home health OT       Equipment Recommendations  3 in 1 bedside comode;Tub/shower seat (Patient states that she is able to borrow DME from mother.)          Precautions / Restrictions Precautions Precautions: Fall   Pertinent Vitals/Pain No complaints.    ADL  Upper Body Dressing: Performed;Set up Lower Body Dressing: Performed;Minimal assistance Where Assessed - Lower Body Dressing: Unsupported sitting Toilet Transfer: Performed;Minimal assistance Toilet Transfer Method: Sit to stand Toilet Transfer Equipment: Bedside commode Toileting - Clothing Manipulation and Hygiene: Performed;Set up Where Assessed - Toileting Clothing Manipulation and Hygiene: Standing Equipment Used: Gait belt;Rolling walker Transfers/Ambulation Related to ADLs: Patient transfers at [employer] level.    OT Diagnosis: Generalized weakness  OT Problem List: Decreased strength;Decreased activity tolerance;Impaired balance (sitting and/or standing)   Visit Information  Last OT Received On: 04/01/12 Assistance Needed: +1    Subjective Data  Subjective: "My mom has equipment that I can borrow." Patient Stated Goal: To use the bathroom.   Prior Functioning     Home Living Lives With: Spouse Available Help at Discharge: Available 24 hours/day Type of Home: House Home Access: Stairs to enter [employer] of Steps: 2 Entrance Stairs-Rails:  None Home Layout: One level Bathroom Shower/Tub: Walk-in shower;Door [employer] Toilet: Pharmacist, community: Yes How Accessible: Accessible via walker Home Adaptive Equipment: Crutches Prior Function Level of Independence: Independent Able to Take Stairs?: Yes Driving: Yes Vocation: Full time employment Comments: manages a Doctor, general practice Communication: No difficulties Dominant Hand: Right         Vision/Perception Vision - History Baseline Vision: Wears glasses only for reading Patient Visual Report: No change from baseline   Cognition  Cognition Overall Cognitive Status: Appears within functional limits for tasks assessed/performed Arousal/Alertness: Awake/alert Orientation Level: Appears intact for tasks assessed Behavior During Session: [facility] for tasks performed    Extremity/Trunk Assessment Right Upper Extremity Assessment RUE ROM/Strength/Tone: Within functional levels Left Upper Extremity Assessment LUE ROM/Strength/Tone: Within functional levels     Mobility Transfers Transfers: Sit to Stand;Stand to Sit Sit to Stand: 4: Min guard;From bed;With upper extremity assist Stand to Sit: To chair/3-in-1;4: Min assist;With upper extremity assist           End of Session OT - End of Session Equipment Utilized During Treatment: Gait belt;Other (comment) (rolling walker) Activity Tolerance: Patient tolerated treatment well Patient left: in chair;with call bell/phone within reach;with family/visitor present    Limmie Patricia, OTR/L 04/01/2012, 9:00 AM

## 2012-04-01 NOTE — Care Management Note (Signed)
    Page 1 of 2   04/03/2012     8:38:07 AM   CARE MANAGEMENT NOTE 04/03/2012  Patient:  Erica Graves, Erica Graves   Account Number:  [id number]  Date Initiated:  04/01/2012  Documentation initiated by:  Sharrie Rothman  Subjective/Objective Assessment:   Pt admitted from home s/p left knee. Pt lives with her husband and will return home at discharge. Pt stated that she has a walker and BSC for home use.     Action/Plan:   Pt choose [facility] for HH and DME needs. Tula Nakayama of [facility] is aware and will collect the pts orders from the chart.   Anticipated DC Date:  04/03/2012   Anticipated DC Plan:  HOME W HOME HEALTH SERVICES      DC Planning Services  CM consult      [facility] Choice  HOME HEALTH  DURABLE MEDICAL EQUIPMENT   Choice offered to / List presented to:     DME arranged  CPM      DME agency  [facility]     HH arranged  HH-2 PT  HH-1 RN  HH-3 OT      [facility] agency  [facility]   Status of service:  Completed, signed off Medicare Important Message given?   (If response is "NO", the following Medicare IM given date fields will be blank) Date Medicare IM given:   Date Additional Medicare IM given:    Discharge Disposition:  HOME W HOME HEALTH SERVICES  Per UR Regulation:    If discussed at Long Length of Stay Meetings, dates discussed:    Comments:  04/03/12 0835 Arlyss Queen, RN BSN CM Pt discharged home today with [facility]. Tula Nakayama of [facility] is aware and will collect the pts information from the chart. [facility] will deliver pts CPM after discharge to pts home. No other DME needs noted. Pt and pts nurse aware of discharge arrangements.  04/01/12 1150 Arlyss Queen, RN BSN CM

## 2012-04-01 NOTE — Progress Notes (Signed)
Physical Therapy Treatment Patient Details Name: Erica Graves MRN: [id number] DOB: 1965/08/05 Today's Date: 04/01/2012 Time: 4098-1191 PT Time Calculation (min): 62 min  PT Assessment / Plan / Recommendation Comments on Treatment Session  Pt with good overall mobility, tranfer and gait ability.  Pt able to STS with no A required.  Increase distance ambulating safely with RW, SBA with min cueing for heel to toe gait and to equalize stride length for improve gait mechanics, no LOB episode during total session  Ended session with CPM 0-60 degrees PROM.  Pt with call bell within reach, family member and RN in room at end of session.    Follow Up Recommendations        Does the patient have the potential to tolerate intense rehabilitation     Barriers to Discharge        Equipment Recommendations       Recommendations for Other Services    Frequency     Plan      Precautions / Restrictions Precautions Precautions: Fall Restrictions Weight Bearing Restrictions: No   Pertinent Vitals/Pain     Mobility  Bed Mobility Bed Mobility: Sit to Supine Sit to Supine: 7: Independent (min assistance with LLE into bed) Details for Bed Mobility Assistance: Pt. able to transfer independently, however needed assistance to guard and move L LE  Transfers Transfers: Stand to Sit;Sit to Stand Sit to Stand: 5: Supervision Stand to Sit: 5: Supervision Ambulation/Gait Ambulation/Gait Assistance: 5: Supervision Ambulation Distance (Feet): 135 Feet Assistive device: Rolling walker Ambulation/Gait Assistance Details: Pt able to STS with no AD or A required safely.  Good mobility demonstrated Gait Pattern: Step-through pattern;Decreased step length - right;Decreased stance time - left Gait velocity: slow General Gait Details: Cueing to equalize stride length Stairs: No Wheelchair Mobility Wheelchair Mobility: No    Exercises Total Joint Exercises Ankle Circles/Pumps: AROM;Both;15  reps;Supine Quad Sets: AROM;Both;10 reps;Supine Short Arc Quad: AAROM;Left;10 reps;Supine Heel Slides: AAROM;Left;10 reps;Supine Hip ABduction/ADduction: AROM;Both;10 reps Goniometric ROM: AAROM 3-58 degrees supine position   PT Diagnosis:    PT Problem List:   PT Treatment Interventions:     PT Goals Acute Rehab PT Goals PT Goal Formulation: With patient/family Potential to Achieve Goals: Good Pt will go Supine/Side to Sit: with min assist;with HOB 0 degrees PT Goal: Supine/Side to Sit - Progress: Progressing toward goal Pt will go Sit to Supine/Side: with min assist;with HOB 0 degrees PT Goal: Sit to Supine/Side - Progress: Progressing toward goal Pt will go Sit to Stand: with supervision;with upper extremity assist PT Goal: Sit to Stand - Progress: Progressing toward goal Pt will go Stand to Sit: with supervision;with upper extremity assist PT Goal: Stand to Sit - Progress: Progressing toward goal Pt will Ambulate: 16 - 50 feet;with supervision;with rolling walker;with crutches (pt to decide...she is proficient with crutches) PT Goal: Ambulate - Progress: Progressing toward goal Pt will Go Up / Down Stairs: 1-2 stairs;with min assist;with crutches;with rolling walker PT Goal: Up/Down Stairs - Progress: Not met  Visit Information  Last PT Received On: 04/01/12    Subjective Data  Subjective: Feeling much better this afternoon, not alot of pain currently.   Cognition  Cognition Overall Cognitive Status: Appears within functional limits for tasks assessed/performed Arousal/Alertness: Awake/alert Orientation Level: Appears intact for tasks assessed Behavior During Session: [facility] for tasks performed    Balance     End of Session PT - End of Session Equipment Utilized During Treatment: Gait belt Activity Tolerance: Patient tolerated treatment  well Patient left: in bed;with call bell/phone within reach;with family/visitor present Nurse Communication: Mobility status   GP      Juel Burrow 04/01/2012, 2:15 PM

## 2012-04-01 NOTE — Progress Notes (Signed)
UR Chart Review Completed  

## 2012-04-01 NOTE — Progress Notes (Signed)
Subjective: Throbbing pain  Postop day 1 left total knee arthroplasty   Objective: Vital signs in last 24 hours: Temp:  [96.3 F (35.7 C)-98.2 F (36.8 C)] 98.2 F (36.8 C) (02/04 0628) Pulse Rate:  [65-102] 79  (02/04 0628) Resp:  [4-29] 18  (02/04 0628) BP: (120-178)/(66-115) 137/79 mmHg (02/04 0628) SpO2:  [94 %-100 %] 98 % (02/04 0628) Weight:  [207 lb 3.7 oz (94 kg)] 207 lb 3.7 oz (94 kg) (02/03 1600)  Intake/Output from previous day: 02/03 0701 - 02/04 0700 In: 4398.3 [P.O.:100; I.V.:3583.3; IV Piggyback:665] Out: 3100 [Urine:3050; Drains:50] Intake/Output this shift:     Basename 04/01/12 0513  HGB 11.1*    Basename 04/01/12 0513  WBC 7.4  RBC 3.67*  HCT 32.6*  PLT 177    Basename 04/01/12 0513  NA 135  K 3.6  CL 103  CO2 26  BUN 8  CREATININE 0.70  GLUCOSE 125*  CALCIUM 8.9   No results found for this basename: LABPT:2,INR:2 in the last 72 hours  Neurologically intact Sensation intact distally Intact pulses distally Dorsiflexion/Plantar flexion intact Incision: dressing C/D/I Compartment soft  Assessment/Plan: So far. She is having some throbbing pain minimal swelling minimal drainage. Recommend ketorolac every 6 hours for 48 hours to help with a throbbing.  Switched to [employer] she can stop the Tylenol IV. Continue with aspirin and TED hose and foot pumps for DVT prevention early motion and out of bed.   Fuller Canada 04/01/2012, 7:15 AM

## 2012-04-01 NOTE — Addendum Note (Signed)
Addendum  created 04/01/12 1049 by Airis Barbee J Tabria Steines, CRNA   Modules edited:Notes Section    

## 2012-04-01 NOTE — Plan of Care (Signed)
Problem: Phase II Progression Outcomes Goal: Tolerating diet Outcome: Completed/Met Date Met:  04/01/12 Pt advanced to a regular diet.  Tolerated well.

## 2012-04-01 NOTE — Anesthesia Postprocedure Evaluation (Signed)
  Anesthesia Post-op Note  Patient: Erica Graves  Procedure(s) Performed: Procedure(s) (LRB) with comments: TOTAL KNEE ARTHROPLASTY (Left) - Left Total Knee Arthroplasty  Patient Location: room 305  Anesthesia Type:Spinal  Level of Consciousness: awake, alert , oriented and patient cooperative  Airway and Oxygen Therapy: Patient Spontanous Breathing  Post-op Pain: 2 /10, mild  Post-op Assessment: Post-op Vital signs reviewed, Patient's Cardiovascular Status Stable, Respiratory Function Stable, Patent Airway, No signs of Nausea or vomiting, Adequate PO intake and Pain level controlled  Post-op Vital Signs: Reviewed and stable  Complications: No apparent anesthesia complications

## 2012-04-02 LAB — CBC
HCT: 30.7 % — ABNORMAL LOW (ref 36.0–46.0)
MCH: 30.3 pg (ref 26.0–34.0)
MCV: 89.5 fL (ref 78.0–100.0)
Platelets: 161 10*3/uL (ref 150–400)
RDW: 13.2 % (ref 11.5–15.5)
WBC: 6.6 10*3/uL (ref 4.0–10.5)

## 2012-04-02 NOTE — Progress Notes (Signed)
Physical Therapy Treatment Patient Details Name: Erica Graves MRN: [id number] DOB: 09-13-1965 Today's Date: 04/02/2012 Time: 1324-4010 PT Time Calculation (min): 30 min  PT Assessment / Plan / Recommendation Comments on Treatment Session  Pt tolerates tx extremely well. Pt requires no assistance with mobility and minimal assistance with LLE exercises. Increased flexion to 70 degrees on CPM without difficulty. Pt is without complaint throughout session.                      Plan  (Continue per PT POC.)           Mobility  Transfers Transfers: Sit to Stand;Stand to Sit Sit to Stand: 6: Modified independent (Device/Increase time) Stand to Sit: 6: Modified independent (Device/Increase time) Ambulation/Gait Ambulation/Gait Assistance: 6: Modified independent (Device/Increase time) Ambulation Distance (Feet): 200 Feet Assistive device: Rolling walker Ambulation/Gait Assistance Details: Steady gait. VC's needed for posture. Gait Pattern: Decreased stance time - right;Decreased stance time - left    Exercises Total Joint Exercises Ankle Circles/Pumps: AROM;10 reps;Seated;Both Quad Sets: AROM;10 reps;Supine;Left Gluteal Sets: AROM;10 reps;Seated;Both Short Arc Quad: AAROM;Left;10 reps;Supine Heel Slides: AROM;AAROM;Left;10 reps;Supine Hip ABduction/ADduction: AROM;10 reps;Supine   Visit Information  Last PT Received On: 04/02/12    Subjective Data  Subjective: I feel comfortable with stairs since we practiced them this morning.         End of Session PT - End of Session Equipment Utilized During Treatment: Gait belt Activity Tolerance: Patient tolerated treatment well Patient left: in bed;in CPM;with call bell/phone within reach CPM Left Knee CPM Left Knee: On Left Knee Flexion (Degrees): 70  Left Knee Extension (Degrees): 10  Additional Comments: Pt tolerates increased flexion with CPM well.  Seth Bake, PTA 04/02/2012, 1:59 PM

## 2012-04-02 NOTE — Progress Notes (Signed)
Subjective: 2 Days Post-Op Procedure(s) (LRB): TOTAL KNEE ARTHROPLASTY (Left) Patient reports pain as 6 on 0-10 scale.    Objective: Vital signs in last 24 hours: Temp:  [98 F (36.7 C)-98.2 F (36.8 C)] 98.2 F (36.8 C) (02/05 0553) Pulse Rate:  [83-90] 89  (02/05 0553) Resp:  [18] 18  (02/05 0553) BP: (124-149)/(75-88) 134/77 mmHg (02/05 0553) SpO2:  [98 %-100 %] 100 % (02/05 0553)  Intake/Output from previous day: 02/04 0701 - 02/05 0700 In: 2145 [P.O.:840; I.V.:1130] Out: 670 [Urine:600; Drains:70] Intake/Output this shift:     Basename 04/02/12 0506 04/01/12 0513  HGB 10.4* 11.1*    Basename 04/02/12 0506 04/01/12 0513  WBC 6.6 7.4  RBC 3.43* 3.67*  HCT 30.7* 32.6*  PLT 161 177    Basename 04/01/12 0513  NA 135  K 3.6  CL 103  CO2 26  BUN 8  CREATININE 0.70  GLUCOSE 125*  CALCIUM 8.9   No results found for this basename: LABPT:2,INR:2 in the last 72 hours  Neurologically intact Neurovascular intact Sensation intact distally Intact pulses distally Dorsiflexion/Plantar flexion intact Incision: scant drainage Compartment soft  Assessment/Plan: 2 Days Post-Op Procedure(s) (LRB): TOTAL KNEE ARTHROPLASTY (Left) Up with therapy  Fuller Canada 04/02/2012, 7:11 AM

## 2012-04-02 NOTE — Progress Notes (Signed)
Physical Therapy Treatment Patient Details Name: Erica Graves MRN: [id number] DOB: 1966/01/08 Today's Date: 04/02/2012 Time: 0820-0910 PT Time Calculation (min): 50 min  PT Assessment / Plan / Recommendation Comments on Treatment Session  Stair training instructed backwards with RW and 1 person assist, no handrails utilized to assure pt able to demonstrate safe mechanics for RTH.  Pt able to demonstrate safe mechanics with stairs and gait training with RW, CGA.  ROM improving, 4-73.  Pt left in chair with recliner lifted, call bell and telephone within reach and RN in room.    Follow Up Recommendations        Does the patient have the potential to tolerate intense rehabilitation     Barriers to Discharge        Equipment Recommendations       Recommendations for Other Services    Frequency     Plan      Precautions / Restrictions Precautions Precautions: Fall Restrictions Weight Bearing Restrictions: No   Pertinent Vitals/Pain     Mobility  Bed Mobility Bed Mobility: Sit to Supine Supine to Sit: 5: Supervision Details for Bed Mobility Assistance: Pt able to transfer independently, does require min assistance with L LE Transfers Transfers: Sit to Stand;Stand to Sit Sit to Stand: 5: Supervision Stand to Sit: 7: Independent Ambulation/Gait Ambulation/Gait Assistance: 5: Supervision Ambulation Distance (Feet): 146 Feet Assistive device: Rolling walker Ambulation/Gait Assistance Details: Safe gait mechanics demonstrated, SBA Gait Pattern: Step-through pattern;Decreased step length - right;Decreased stance time - left Stairs: Yes Stairs Assistance: 4: Min guard Stair Management Technique: With walker;No rails Number of Stairs: 2     Exercises Total Joint Exercises Ankle Circles/Pumps: AROM;Both;15 reps;Supine Quad Sets: AROM;Left;10 reps;Supine Gluteal Sets: AROM;Both;10 reps;Supine Short Arc Quad: AAROM;Left;10 reps;Supine Heel Slides: AAROM;Left;10  reps;Supine Goniometric ROM: AAROM 4-73   PT Diagnosis:    PT Problem List:   PT Treatment Interventions:     PT Goals Acute Rehab PT Goals PT Goal Formulation: With patient/family Potential to Achieve Goals: Good Pt will go Supine/Side to Sit: with min assist;with HOB 0 degrees PT Goal: Supine/Side to Sit - Progress: Met Pt will go Sit to Supine/Side: with min assist;with HOB 0 degrees Pt will go Sit to Stand: with supervision;with upper extremity assist PT Goal: Sit to Stand - Progress: Met Pt will go Stand to Sit: with supervision;with upper extremity assist PT Goal: Stand to Sit - Progress: Met Pt will Ambulate: 16 - 50 feet;with supervision;with rolling walker;with crutches (pt to decide...she is proficient with crutches) PT Goal: Ambulate - Progress: Met Pt will Go Up / Down Stairs: 1-2 stairs;with min assist;with crutches;with rolling walker PT Goal: Up/Down Stairs - Progress: Progressing toward goal  Visit Information  Last PT Received On: 04/02/12    Subjective Data  Subjective: Pain scale 7/10, premedicated   Cognition  Cognition Overall Cognitive Status: Appears within functional limits for tasks assessed/performed Arousal/Alertness: Awake/alert Orientation Level: Appears intact for tasks assessed Behavior During Session: [facility] for tasks performed    Balance     End of Session PT - End of Session Equipment Utilized During Treatment: Gait belt Activity Tolerance: Patient tolerated treatment well Patient left: in chair;with call bell/phone within reach;with nursing in room Nurse Communication: Mobility status   GP     Juel Burrow 04/02/2012, 9:13 AM

## 2012-04-03 ENCOUNTER — Telehealth: Payer: Self-pay | Admitting: Orthopedic Surgery

## 2012-04-03 ENCOUNTER — Encounter (HOSPITAL_COMMUNITY): Payer: Self-pay | Admitting: Orthopedic Surgery

## 2012-04-03 DIAGNOSIS — Z96652 Presence of left artificial knee joint: Secondary | ICD-10-CM

## 2012-04-03 LAB — CBC
Hemoglobin: 9.7 g/dL — ABNORMAL LOW (ref 12.0–15.0)
MCH: 30.4 pg (ref 26.0–34.0)
MCHC: 33.8 g/dL (ref 30.0–36.0)
RDW: 13.4 % (ref 11.5–15.5)

## 2012-04-03 MED ORDER — ASPIRIN 325 MG PO TBEC: 325.0000 mg | DELAYED_RELEASE_TABLET | Freq: Two times a day (BID) | ORAL | Status: DC

## 2012-04-03 MED ORDER — BISACODYL 5 MG PO TBEC: 5.0000 mg | DELAYED_RELEASE_TABLET | Freq: Every day | ORAL | Status: DC | PRN

## 2012-04-03 MED ORDER — OXYCODONE-ACETAMINOPHEN 5-325 MG PO TABS: 1.0000 | ORAL_TABLET | ORAL | Status: DC

## 2012-04-03 MED ORDER — METHOCARBAMOL 500 MG PO TABS: 500.0000 mg | ORAL_TABLET | Freq: Four times a day (QID) | ORAL | Status: DC

## 2012-04-03 NOTE — Progress Notes (Signed)
Pt discharge home today per Dr. Romeo Apple. Pt's IV site D/C'd and WNL. Pt's VS stable at this time. Pt provided with home medication list, discharge instructions and prescriptions. Verbalized understanding. Pt made aware of F/U appointment already in place with Dr. Mort Sawyers office. Verbalized understanding. Pt left floor via WC in stable condition accompanied by RN.

## 2012-04-03 NOTE — Progress Notes (Signed)
CPM machine put back on at 2200. Will be removed at 0300.

## 2012-04-03 NOTE — Discharge Summary (Signed)
  Discharge summary  Date of admission 03/31/2012  Date of discharge 04/04/2011  Admitting diagnosis osteoarthritis left knee  Operative findings grade 4 wear of the medial compartment minimal osteophytes and grade 3 chondral changes of the patella  Hospital course the patient was admitted on February 3 she underwent uncomplicated left total knee arthroplasty with a [employer] posterior stabilized fixed bearing implant. She tolerated the procedure well and had no postoperative complications. She ablated greater than 100 feet her range of motion was 4-75.  Hemoglobin & Hematocrit     Component Value Date/Time   HGB 9.7* 04/03/2012 0443   HCT 28.7* 04/03/2012 0443     Medication List     As of 04/03/2012 11:50 AM    TAKE these medications         aspirin 325 MG EC tablet   Take 1 tablet (325 mg total) by mouth 2 (two) times daily.      bisacodyl 5 MG EC tablet   Commonly known as: DULCOLAX   Take 1 tablet (5 mg total) by mouth daily as needed.      methocarbamol 500 MG tablet   Commonly known as: ROBAXIN   Take 1 tablet (500 mg total) by mouth 4 (four) times daily.      oxyCODONE-acetaminophen 5-325 MG per tablet   Commonly known as: PERCOCET/ROXICET   Take 1 tablet by mouth every 4 (four) hours.         Followup February 17th for staple removal

## 2012-04-03 NOTE — Telephone Encounter (Signed)
Patient called, now home from hospital as of today, status post total knee surgery 03/31/12.  Asking if okay to shower; states she has gauze covering over the knee.  Okay to "cover with plastic" and shower?  Patient ph# [phone]

## 2012-04-07 LAB — TYPE AND SCREEN: Unit division: 0

## 2012-04-14 ENCOUNTER — Encounter: Payer: Self-pay | Admitting: Orthopedic Surgery

## 2012-04-14 ENCOUNTER — Ambulatory Visit (INDEPENDENT_AMBULATORY_CARE_PROVIDER_SITE_OTHER): Payer: BC Managed Care – PPO | Admitting: Orthopedic Surgery

## 2012-04-14 VITALS — BP 122/78 | Ht 66.0 in | Wt 200.0 lb

## 2012-04-14 DIAGNOSIS — Z96659 Presence of unspecified artificial knee joint: Secondary | ICD-10-CM

## 2012-04-14 MED ORDER — METHOCARBAMOL 500 MG PO TABS: 500.0000 mg | ORAL_TABLET | Freq: Four times a day (QID) | ORAL | Status: DC

## 2012-04-14 MED ORDER — OXYCODONE-ACETAMINOPHEN 5-325 MG PO TABS: 1.0000 | ORAL_TABLET | ORAL | Status: DC

## 2012-04-14 NOTE — Patient Instructions (Signed)
Continue therapy

## 2012-04-14 NOTE — Progress Notes (Signed)
Patient ID: Erica Graves, female   DOB: February 06, 1966, 47 y.o.   MRN: [id number] Chief Complaint  Patient presents with  . Follow-up    Post op 1 left TKA, 03/31/2012    BP 122/78  Ht 5\' 6"  (1.676 m)  Wt 200 lb (90.719 kg)  BMI 32.3 kg/m2  The patient had a left total knee doing well therapy notes indicate 90 knee flexion patient inventory with crutches outside the house walker inside the house she says her pain is much better well controlled on Percocet and Robaxin both were refilled  Continue therapy at home outpatient therapy when appropriate return in 4 weeks

## 2012-04-22 ENCOUNTER — Ambulatory Visit (HOSPITAL_COMMUNITY)
Admission: RE | Admit: 2012-04-22 | Discharge: 2012-04-22 | Disposition: A | Discharge status: Home / Self Care | Payer: BC Managed Care – PPO | Source: Ambulatory Visit | Attending: Orthopedic Surgery | Admitting: Orthopedic Surgery

## 2012-04-22 DIAGNOSIS — IMO0001 Reserved for inherently not codable concepts without codable children: Secondary | ICD-10-CM | POA: Insufficient documentation

## 2012-04-22 DIAGNOSIS — R262 Difficulty in walking, not elsewhere classified: Secondary | ICD-10-CM | POA: Insufficient documentation

## 2012-04-22 DIAGNOSIS — M25569 Pain in unspecified knee: Secondary | ICD-10-CM | POA: Insufficient documentation

## 2012-04-22 DIAGNOSIS — M6281 Muscle weakness (generalized): Secondary | ICD-10-CM | POA: Insufficient documentation

## 2012-04-22 DIAGNOSIS — R29898 Other symptoms and signs involving the musculoskeletal system: Secondary | ICD-10-CM | POA: Insufficient documentation

## 2012-04-22 DIAGNOSIS — M25669 Stiffness of unspecified knee, not elsewhere classified: Secondary | ICD-10-CM | POA: Insufficient documentation

## 2012-04-22 NOTE — Evaluation (Signed)
Physical Therapy Evaluation  Patient Details  Name: Erica Graves MRN: [id number] Date of Birth: 1965/10/04  Today's Date: 04/22/2012 Time: 1525-1610 PT Time Calculation (min): 45 min  Visit#: 1 of 18  Re-eval: 05/04/12 Assessment Diagnosis: L TKR Surgical Date: 03/31/12 Next MD Visit: 05/12/2012 Prior Therapy: HH  Authorization: BCBS   Authorization Visit#: 1 of 12   Past Medical History:  Past Medical History  Diagnosis Date  . Obesity   . Chronic low back pain   . Depression with anxiety   . Constipation   . Kidney stone    Past Surgical History:  Past Surgical History  Procedure Laterality Date  . Left knee arthroscopy  08/14/2005 Dr. Romeo Apple torn meniscus  . Knee arthroscopy    . Partial hysterectomy    . Lumbar spine surgery    . Back surgery    . Knee arthroscopy  05/21/2011    Procedure: ARTHROSCOPY KNEE;  Surgeon: Vickki Hearing, MD;  Location: [facility];  Service: Orthopedics;  Laterality: Left;  lateral menisectomy  . Abdominal hysterectomy    . Total knee arthroplasty  03/31/2012    Procedure: TOTAL KNEE ARTHROPLASTY;  Surgeon: Vickki Hearing, MD;  Location: [facility];  Service: Orthopedics;  Laterality: Left;  Left Total Knee Arthroplasty    Subjective Symptoms/Limitations Symptoms: [name] had a TKR on 03/31/2012.  She was discharged on the 5th to [facility] and is now being referred to OP therapy to maximize her functional potential.  [name] states that she mainly feels very stiff.  She is ambulating with crutches.   How long can you sit comfortably?: Able to sit without difficulty How long can you stand comfortably?: Able to stand fotr 15-20 minutes How long can you walk comfortably?: The patient is walking with crutches and is able to walk for 20-30 minutes. Pain Assessment Currently in Pain?: Yes Pain Score:   4 Pain Location: Knee Pain Orientation: Left Pain Type: Surgical pain Pain Onset: 1 to 4 weeks ago  Prior Functioning  Prior  Function Vocation: Full time employment Vocation Requirements: Retail banker sit/stand Leisure: Hobbies-yes (Comment) Comments: softball  Cognition/Observation Observation/Other Assessments Observations: Pt ambulates with cruthes w/L LE ER/ decreased stance on L LE decreased stride and decreased knee flexion/dorsiflexion.     Assessment LLE AROM (degrees) Left Knee Extension: 15 Left Knee Flexion: 70 LLE PROM (degrees) Left Hip Extension: 12 Left Hip Flexion: 78 LLE Strength Left Hip Flexion: 1/5 Left Hip Extension: 4/5 Left Hip ABduction: 5/5 Left Hip ADduction: 3/5 Left Knee Flexion: 3+/5 Left Knee Extension: 1/5  Exercise/Treatments Supine Quad Sets: 10 reps Heel Slides: 10 reps Straight Leg Raises: Limitations Straight Leg Raises Limitations: knee bent to 60 degree Knee Extension: PROM Knee Flexion: PROM   Prone  Hamstring Curl: 10 reps   Modalities Modalities: Archivist Stimulation Location: L quad  Electrical Stimulation Action: Russian stim 4 electorde co-contraction  Electrical Stimulation Parameters: 100 bbm ; 15 Ma, on/off 10/10; x Electrical Stimulation Goals: Strength  Physical Therapy Assessment and Plan PT Assessment and Plan Clinical Impression Statement: Pt s/p TKR who has extreme weakness in her quad as well as her hip flexors as well as decreased ROM.  PT will benefit from skilled therapy to regain strength, Rom and functional mobiliy. Pt will benefit from skilled therapeutic intervention in order to improve on the following deficits: Decreased activity tolerance;Decreased range of motion;Decreased strength;Difficulty walking;Increased edema;Pain Rehab Potential: Good PT Frequency: Min 3X/week PT Duration: 6  weeks PT Treatment/Interventions: Gait training;Therapeutic activities;Therapeutic exercise;Balance training;Manual techniques;Modalities;Patient/family education PT Plan: begin bike,  heel raise, minisquats, rockerboard, terminal ext both standing and supine.  3rd treatment begin lateral and forward step up with 2" step     Goals Home Exercise Program Pt will Perform Home Exercise Program: Independently PT Short Term Goals Time to Complete Short Term Goals: 2 weeks PT Short Term Goal 1: increase ROM to 10 to 95 degree to allow more normalized gt PT Short Term Goal 2: increase mm strength by 1/2 grade to improve knee stability to allow walking with one crutch PT Short Term Goal 3: heel toe gait pattern to prevent hip/back issues. PT Long Term Goals Time to Complete Long Term Goals: 4 weeks PT Long Term Goal 1: I in advance HEp PT Long Term Goal 2: Pt strength to be increased by one grade to be able to go up steps with one hand hold and no crutches. Long Term Goal 3: Able to ambulate inside without assistive device Long Term Goal 4: ROM 4 to 120 to allow pt to squat to pick items off the floor. PT Long Term Goal 5: Pt able to ambulate for an hour without difficutly  Problem List Patient Active Problem List  Diagnosis  . OVERWEIGHT  . DEPRESSION/ANXIETY  . CONSTIPATION  . DEGENERATIVE JOINT DISEASE, KNEES, BILATERAL  . SHOULDER PAIN, LEFT  . PATELLO-FEMORAL SYNDROME  . Lumbago  . FOOT PAIN, LEFT  . Medial meniscus, posterior horn derangement  . Stiffness of knee joint  . S/P arthroscopy of left knee  . Lateral meniscus tear  . Osteoarthritis of left knee  . Arthritis of knee, degenerative  . S/P total knee replacement  . Stiffness of joint, not elsewhere classified, lower leg  . Weakness of left leg  . Difficulty walking    PT Plan of Care PT Home Exercise Plan: given  GP    RUSSELL,CINDY 04/22/2012, 4:27 PM  Physician Documentation Your signature is required to indicate approval of the treatment plan as stated above.  Please sign and either send electronically or make a copy of this report for your files and return this physician signed original.    Please mark one 1.__approve of plan  2. ___approve of plan with the following conditions.   ______________________________                                                          _____________________ Physician Signature                                                                                                             Date

## 2012-04-24 ENCOUNTER — Ambulatory Visit (HOSPITAL_COMMUNITY)
Admission: RE | Admit: 2012-04-24 | Discharge: 2012-04-24 | Disposition: A | Discharge status: Home / Self Care | Payer: BC Managed Care – PPO | Source: Ambulatory Visit | Attending: Orthopedic Surgery | Admitting: Orthopedic Surgery

## 2012-04-24 DIAGNOSIS — R262 Difficulty in walking, not elsewhere classified: Secondary | ICD-10-CM

## 2012-04-24 DIAGNOSIS — M25669 Stiffness of unspecified knee, not elsewhere classified: Secondary | ICD-10-CM

## 2012-04-24 DIAGNOSIS — R29898 Other symptoms and signs involving the musculoskeletal system: Secondary | ICD-10-CM

## 2012-04-24 NOTE — Progress Notes (Signed)
Physical Therapy Treatment Patient Details  Name: Erica Graves MRN: [id number] Date of Birth: June 01, 1965  Today's Date: 04/24/2012 Time: 4098-1191 PT Time Calculation (min): 62 min  Visit#: 2 of 18  Re-eval: 05/04/12 Assessment Diagnosis: L TKR Surgical Date: 03/31/12 Next MD Visit: Romeo Apple 05/12/2012 Prior Therapy: HH Charge: therex 50', estim x 10'  Authorization: BCBS  Authorization Time Period:    Authorization Visit#: 2 of 12   Subjective: Symptoms/Limitations Symptoms: Pt reported pain free today just really tight.  Pt asked about riding her personal bike at [facility] and encouraged to. Pain Assessment Currently in Pain?: No/denies  Objective:  Exercise/Treatments Aerobic Stationary Bike: 8' rocking Standing Heel Raises: 15 reps;Limitations Heel Raises Limitations: toe raises  Knee Flexion: 10 reps Functional Squat: 10 reps;Limitations Functional Squat Limitations: cueing to equalize weight distr Gait Training: 158' no AD with cueing for heel to toe gait  Supine Quad Sets: 10 reps Heel Slides: 10 reps Terminal Knee Extension: AAROM;10 reps;Limitations Terminal Knee Extension Limitations: unable  Straight Leg Raises: AAROM;5 reps Knee Extension: PROM Knee Flexion: PROM   Modalities Modalities: Archivist Stimulation Location: L quad  Electrical Stimulation Action: Russian estim 4 electrode co-contraction Electrical Stimulation Parameters: 100 bbm; 15 Ma, on/off 10/30 x Electrical Stimulation Goals: Strength  Physical Therapy Assessment and Plan PT Assessment and Plan Clinical Impression Statement: Began POC for L TKR for ROM and strengthening.  Gait training complete with no AD cueing to equalize stride length and knee extension with heel to toe.  Continued with russian estim for quad strengthening.  Pt unable to complete supine TKE due to quad weakness..  Pt tolerated well to PROM and stated she felt  better at end of session, no increased pain and felt tooser. PT Plan: Continue with russian for quad strengthening.  Progress ROM and begin lateral and forward step up 2" next session.    Goals    Problem List Patient Active Problem List  Diagnosis  . OVERWEIGHT  . DEPRESSION/ANXIETY  . CONSTIPATION  . DEGENERATIVE JOINT DISEASE, KNEES, BILATERAL  . SHOULDER PAIN, LEFT  . PATELLO-FEMORAL SYNDROME  . Lumbago  . FOOT PAIN, LEFT  . Medial meniscus, posterior horn derangement  . Stiffness of knee joint  . S/P arthroscopy of left knee  . Lateral meniscus tear  . Osteoarthritis of left knee  . Arthritis of knee, degenerative  . S/P total knee replacement  . Stiffness of joint, not elsewhere classified, lower leg  . Weakness of left leg  . Difficulty walking    PT - End of Session Activity Tolerance: Patient tolerated treatment well General Behavior During Session: [facility] for tasks performed Cognition: [facility] for tasks performed  GP    Juel Burrow 04/24/2012, 4:51 PM

## 2012-04-28 ENCOUNTER — Telehealth: Payer: Self-pay | Admitting: Orthopedic Surgery

## 2012-04-28 NOTE — Telephone Encounter (Signed)
Patient states she is still unable to lift left leg from a sitting or a standing position, following total knee replacement 03/31/12; states going to therapy and doing home exercises but concerned.  I scheduled appointment this week (regular appointment 05/12/12)  Pt ph [phone].

## 2012-04-29 ENCOUNTER — Ambulatory Visit (INDEPENDENT_AMBULATORY_CARE_PROVIDER_SITE_OTHER): Payer: BC Managed Care – PPO

## 2012-04-29 ENCOUNTER — Telehealth: Payer: Self-pay | Admitting: Orthopedic Surgery

## 2012-04-29 ENCOUNTER — Ambulatory Visit (HOSPITAL_COMMUNITY)
Admission: RE | Admit: 2012-04-29 | Discharge: 2012-04-29 | Disposition: A | Discharge status: Home / Self Care | Payer: BC Managed Care – PPO | Source: Ambulatory Visit | Attending: Orthopedic Surgery | Admitting: Orthopedic Surgery

## 2012-04-29 ENCOUNTER — Ambulatory Visit (INDEPENDENT_AMBULATORY_CARE_PROVIDER_SITE_OTHER): Payer: BC Managed Care – PPO | Admitting: Orthopedic Surgery

## 2012-04-29 VITALS — BP 140/90 | Ht 66.0 in | Wt 200.0 lb

## 2012-04-29 DIAGNOSIS — M25569 Pain in unspecified knee: Secondary | ICD-10-CM

## 2012-04-29 DIAGNOSIS — M1710 Unilateral primary osteoarthritis, unspecified knee: Secondary | ICD-10-CM

## 2012-04-29 DIAGNOSIS — Z96652 Presence of left artificial knee joint: Secondary | ICD-10-CM

## 2012-04-29 DIAGNOSIS — M25562 Pain in left knee: Secondary | ICD-10-CM

## 2012-04-29 DIAGNOSIS — Z96659 Presence of unspecified artificial knee joint: Secondary | ICD-10-CM

## 2012-04-29 DIAGNOSIS — IMO0001 Reserved for inherently not codable concepts without codable children: Secondary | ICD-10-CM | POA: Insufficient documentation

## 2012-04-29 DIAGNOSIS — M6281 Muscle weakness (generalized): Secondary | ICD-10-CM | POA: Insufficient documentation

## 2012-04-29 DIAGNOSIS — M25669 Stiffness of unspecified knee, not elsewhere classified: Secondary | ICD-10-CM | POA: Insufficient documentation

## 2012-04-29 DIAGNOSIS — R262 Difficulty in walking, not elsewhere classified: Secondary | ICD-10-CM | POA: Insufficient documentation

## 2012-04-29 DIAGNOSIS — M13162 Monoarthritis, not elsewhere classified, left knee: Secondary | ICD-10-CM

## 2012-04-29 MED ORDER — PREDNISONE 10 MG PO KIT: 10.0000 mg | PACK | ORAL | Status: DC

## 2012-04-29 NOTE — Progress Notes (Signed)
Patient ID: Erica Graves, female   DOB: December 14, 1965, 47 y.o.   MRN: [id number] Chief Complaint  Patient presents with  . Follow-up    Left knee TKA 03/31/12    BP 140/90  Ht 5\' 6"  (1.676 m)  Wt 200 lb (90.719 kg)  BMI 32.3 kg/m2  Inflammation of joint of knee, left - Plan: PredniSONE 10 MG KIT  Knee pain, left - Plan: DG Knee AP/LAT W/Sunrise Left, PredniSONE 10 MG KIT  S/P total knee replacement, left    The patient comes in earlier than her scheduled appointment secondary to concern that she cannot do a terminal knee extension  I rechecked her hospital therapy notes that she indeed could do a short arc quad. I called therapy and they said she could do it with assistance in the hospital and she was able to do it in the therapy department today.  She has no palpable deficit superior inferior patella no excessive tenderness quadriceps mechanism. She does exhibit some swelling more than expected at postop day #29 which is week 4.  Surprisingly she can ambulate without crutches although she has a limp.  She can extend the knee with gravity removed. She has good quadriceps contraction and control of the patella in terms of proximal pole of the patella with quadriceps contraction. It appears that she's using her hip flexors to try to extend her knee which is a neuromuscular control issue not a rupture issue  We took an x-ray with knee flexed and it shows that the patella was in appropriate position not excessively high or excessively low  She can do a heel slide forward and backwards.  Recommend quad sets and heel slides with terminal knee extensions with gravity removed until she can do one with gravity applied.  Routine followup on the 17th. Wear knee immobilizer at night.

## 2012-04-29 NOTE — Patient Instructions (Signed)
Knee brace at night   Quad sets  Knee slides   Ice   Take the new medication

## 2012-04-29 NOTE — Progress Notes (Signed)
Physical Therapy Treatment Patient Details  Name: Erica Graves MRN: [id number] Date of Birth: 01-01-1966  Today's Date: 04/29/2012 Time: 4010-2725 PT Time Calculation (min): 60 min  Visit#: 3 of 18  Re-eval: 05/04/12 Charges: Therex x 30' Manual x 12' Ice x 10'  Authorization: BCBS  Authorization Visit#: 3 of 12   Subjective: Symptoms/Limitations Symptoms: Pt is concerned about being unable to lift left leg. Knee continues to be very stiff. Pain Assessment Currently in Pain?: No/denies   Exercise/Treatments Aerobic Stationary Bike: 8' rocking Seated Other Seated Knee Exercises: Biodex 70% flexion Supine Quad Sets: 10 reps Straight Leg Raises: AAROM;5 reps Knee Extension: PROM Knee Flexion: PROM Other Supine Knee Exercises: Bent knee raise L AAROM x 5   Modalities Modalities: Cryotherapy Manual Therapy Manual Therapy: Myofascial release Myofascial Release: To L incision to decrease tightness and adhesions Cryotherapy Number Minutes Cryotherapy: 10 Minutes Cryotherapy Location: Knee (Left) Type of Cryotherapy: Ice pack  Physical Therapy Assessment and Plan PT Assessment and Plan Clinical Impression Statement: Tx focus on increasing ROM and improving hip flexor strength. Pt able to contract lower quad but unable to lift leg from SLR independently. Estim not completed as pt's pants would not allow proper pad placement. Pt asked to wear shorts next session. Ice applied at end of session to limit pain and inflammation Pt reports 0/10 pain at end of session. PT Plan: Begin [place] estim for hip flexors in SL next session.  Progress ROM and begin lateral and forward step up 2" next session.     Problem List Patient Active Problem List  Diagnosis  . OVERWEIGHT  . DEPRESSION/ANXIETY  . CONSTIPATION  . DEGENERATIVE JOINT DISEASE, KNEES, BILATERAL  . SHOULDER PAIN, LEFT  . PATELLO-FEMORAL SYNDROME  . Lumbago  . FOOT PAIN, LEFT  . Medial meniscus, posterior horn  derangement  . Stiffness of knee joint  . S/P arthroscopy of left knee  . Lateral meniscus tear  . Osteoarthritis of left knee  . Arthritis of knee, degenerative  . S/P total knee replacement  . Stiffness of joint, not elsewhere classified, lower leg  . Weakness of left leg  . Difficulty walking    PT - End of Session Activity Tolerance: Patient tolerated treatment well General Behavior During Session: [facility] for tasks performed Cognition: [facility] for tasks performed  Seth Bake, PTA  04/29/2012, 10:58 AM

## 2012-04-29 NOTE — Telephone Encounter (Signed)
Called AJ back with clarification regarding prednisone 12 day.

## 2012-04-29 NOTE — Telephone Encounter (Signed)
AJ, pharmacist from [employer] called regarding patient's Prednisone pack - states needs to know if 6-day or 12-day.  Please call [phone].

## 2012-04-29 NOTE — Telephone Encounter (Signed)
Schedule appt for eval of problem

## 2012-04-29 NOTE — Telephone Encounter (Signed)
Noted. Appointment is today, patient aware.

## 2012-05-01 ENCOUNTER — Ambulatory Visit (HOSPITAL_COMMUNITY)
Admission: RE | Admit: 2012-05-01 | Discharge: 2012-05-01 | Disposition: A | Discharge status: Home / Self Care | Payer: BC Managed Care – PPO | Source: Ambulatory Visit | Attending: Orthopedic Surgery | Admitting: Orthopedic Surgery

## 2012-05-01 NOTE — Progress Notes (Addendum)
Physical Therapy Treatment Patient Details  Name: Erica Graves MRN: [id number] Date of Birth: 04-Jan-1966  Today's Date: 05/01/2012 Time: 1008-1105 PT Time Calculation (min): 57 min  Visit#: 4 of 18  Re-eval: 05/04/12 Charges: Therex x 18' NMR x 18' Estim x 15'  Authorization: BCBS  Authorization Visit#: 3 of 12   Subjective: Symptoms/Limitations Symptoms: Pt states that she feels like her leg is getting a little stronger. Pain Assessment Currently in Pain?: No/denies   Exercise/Treatments Aerobic Stationary Bike: 8' rocking Seated Other Seated Knee Exercises: Biodex 70% flexion with 5 30" at end range of flexion Supine Quad Sets: 10 reps Short Arc Quad Sets: Strengthening;Limitations Water quality scientist Limitations: With [place] estim x 15' 10/30 duty cycle Terminal Knee Extension: 5 sets;AAROM Straight Leg Raises: 3 sets;Limitations Straight Leg Raises Limitations: AROM with knee bent Knee Extension: PROM Knee Flexion: PROM   Russian estim 10/30 duty cycle to L knee with SAQ x 15'    Physical Therapy Assessment and Plan PT Assessment and Plan Clinical Impression Statement: Pt able to complete full knee extension in gravity eliminated position. Pt is able to complete quad set with mm tapping for facilitation. [place] estim completed with good distal quad contraction. Completely straighten knee with for SAQ with estim on distal quad. PT Plan: Continue to progress strength and ROM per PT POC. Continue [place] estim for [employer] of L quad.     Problem List Patient Active Problem List  Diagnosis  . OVERWEIGHT  . DEPRESSION/ANXIETY  . CONSTIPATION  . DEGENERATIVE JOINT DISEASE, KNEES, BILATERAL  . SHOULDER PAIN, LEFT  . PATELLO-FEMORAL SYNDROME  . Lumbago  . FOOT PAIN, LEFT  . Medial meniscus, posterior horn derangement  . Stiffness of knee joint  . S/P arthroscopy of left knee  . Lateral meniscus tear  . Osteoarthritis of left knee  . Arthritis of knee,  degenerative  . S/P total knee replacement  . Stiffness of joint, not elsewhere classified, lower leg  . Weakness of left leg  . Difficulty walking    PT - End of Session Activity Tolerance: Patient tolerated treatment well General Behavior During Session: [facility] for tasks performed Cognition: [facility] for tasks performed  Seth Bake, PTA 05/01/2012, 11:14 AM

## 2012-05-02 ENCOUNTER — Ambulatory Visit (HOSPITAL_COMMUNITY): Payer: BC Managed Care – PPO | Admitting: *Deleted

## 2012-05-05 ENCOUNTER — Ambulatory Visit (HOSPITAL_COMMUNITY)
Admission: RE | Admit: 2012-05-05 | Discharge: 2012-05-05 | Disposition: A | Discharge status: Home / Self Care | Payer: BC Managed Care – PPO | Source: Ambulatory Visit | Attending: Orthopedic Surgery | Admitting: Orthopedic Surgery

## 2012-05-05 DIAGNOSIS — R262 Difficulty in walking, not elsewhere classified: Secondary | ICD-10-CM

## 2012-05-05 DIAGNOSIS — M25669 Stiffness of unspecified knee, not elsewhere classified: Secondary | ICD-10-CM

## 2012-05-05 DIAGNOSIS — R29898 Other symptoms and signs involving the musculoskeletal system: Secondary | ICD-10-CM

## 2012-05-05 NOTE — Progress Notes (Signed)
Physical Therapy Treatment Patient Details  Name: Erica Graves MRN: [id number] Date of Birth: 1965-10-02  Today's Date: 05/05/2012 Time: 1050-1135 PT Time Calculation (min): 45 min  Visit#: 5 of 18  Re-eval: 05/04/12 Charges: Therex x 32' Manual x 8'  Authorization: BCBS  Authorization Visit#: 5 of 12   Subjective: Symptoms/Limitations Symptoms: I'm doing better.  I am doing those quad sets at least 100 a day and I can raise my leg higher now. Pain Assessment Currently in Pain?: No/denies   Exercise/Treatments Stretches Gastroc Stretch: 3 reps;30 seconds;Limitations Gastroc Stretch Limitations: Slant board Aerobic Stationary Bike: 10' rocking Standing Heel Raises: 15 reps;Limitations Heel Raises Limitations: toe raises  Knee Flexion: 10 reps;Left Terminal Knee Extension: 10 reps;Theraband;Left Functional Squat: 10 reps;Limitations Functional Squat Limitations: cueing to equalize weight distr Rocker Board: 2 minutes;Limitations (R/L) SLS: L: 25" mas of 5 Seated Other Seated Knee Exercises: Biodex 75% flexion with 5 30" at end range of flexion Supine Quad Sets: 10 reps Short Arc Quad Sets: 5 reps;AROM;Left Terminal Knee Extension: 5 sets;AROM;Left Knee Extension: PROM Knee Flexion: PROM   Modalities Modalities: Cryotherapy Manual Therapy Manual Therapy: Joint mobilization Joint Mobilization: pateall mobs R/L A/P; Grade I-III tib/fib A/P jt mobs to decrease pain and improve ROM Myofascial Release: To L incision to decrease tightness and adhesions Cryotherapy Cryotherapy Location: Knee (Left)  Physical Therapy Assessment and Plan PT Assessment and Plan Clinical Impression Statement: Pt displays improvements in ROM and strength. AROM 2-75. Pt tolerated 5% increase on biodex passive flexion. Pt able to complete quad set, TKE, and SAQ actively through quad fatigues quickly. [place] estim not needed. PT Plan: Continue to progress strength and ROM per PT POC.      Problem List Patient Active Problem List  Diagnosis  . OVERWEIGHT  . DEPRESSION/ANXIETY  . CONSTIPATION  . DEGENERATIVE JOINT DISEASE, KNEES, BILATERAL  . SHOULDER PAIN, LEFT  . PATELLO-FEMORAL SYNDROME  . Lumbago  . FOOT PAIN, LEFT  . Medial meniscus, posterior horn derangement  . Stiffness of knee joint  . S/P arthroscopy of left knee  . Lateral meniscus tear  . Osteoarthritis of left knee  . Arthritis of knee, degenerative  . S/P total knee replacement  . Stiffness of joint, not elsewhere classified, lower leg  . Weakness of left leg  . Difficulty walking    PT - End of Session Activity Tolerance: Patient tolerated treatment well General Behavior During Session: [facility] for tasks performed Cognition: [facility] for tasks performed  Seth Bake, PTA' 05/05/2012, 11:46 AM

## 2012-05-07 ENCOUNTER — Ambulatory Visit (HOSPITAL_COMMUNITY)
Admission: RE | Admit: 2012-05-07 | Discharge: 2012-05-07 | Disposition: A | Discharge status: Home / Self Care | Payer: BC Managed Care – PPO | Source: Ambulatory Visit | Attending: Orthopedic Surgery | Admitting: Orthopedic Surgery

## 2012-05-07 NOTE — Progress Notes (Addendum)
Physical Therapy Treatment Patient Details  Name: Erica Graves MRN: [id number] Date of Birth: 1965-11-18  Today's Date: 05/07/2012 Time: 1308-6578 PT Time Calculation (min): 82 min Charge : therex 50', manual 12', ice x 10'  Visit#: 6 of 18  Re-eval: 05/04/12 Assessment Diagnosis: L TKR Surgical Date: 03/31/12 Next MD Visit: Romeo Apple 05/12/2012 Prior Therapy: HH  Authorization: BCBS  Authorization Time Period:    Authorization Visit#: 6 of 12   Subjective: Symptoms/Limitations Symptoms: Pt. states she's not hurting, just stiff. Pain Assessment Currently in Pain?: No/denies  Objective:  Exercise/Treatments Stretches Gastroc Stretch: 3 reps;30 seconds;Limitations Gastroc Stretch Limitations: Slant board Aerobic Stationary Bike: 10' rocking Standing Heel Raises: 15 reps Heel Raises Limitations: toe raises 15X Knee Flexion: 15 reps Terminal Knee Extension: 15 reps;Theraband Theraband Level (Terminal Knee Extension): Level 4 (Blue) Functional Squat: 15 reps Functional Squat Limitations: cueing to equalize weight distr Rocker Board: 2 minutes;Limitations SLS: L: 47" max of 5 Seated Other Seated Knee Exercises: Biodex 75% flexion with   Modalities Modalities: Cryotherapy Manual Therapy Manual Therapy: Joint mobilization Joint Mobilization: patella mobs R/L; S/I, Grade I-III tib/fib A/P joint mobs to decrease pain and improve ROM Myofascial Release: to scar tissue to decrease tightness and adhesions Cryotherapy Number Minutes Cryotherapy: 10 Minutes Cryotherapy Location: Knee Type of Cryotherapy: Ice pack  Physical Therapy Assessment and Plan PT Assessment and Plan Clinical Impression Statement: Overall quad strength continues to improve, pt able to complete quad sets, TKE and SAQ with no [place] estim required this session, Pt musculature fatigued end of session but stated no increased in pain with activities.  AROM 4-72 this session. PT Plan: Continue to  progress strength and ROM per PT POC.  Re-eval prior MD apt.    Goals    Problem List Patient Active Problem List  Diagnosis  . OVERWEIGHT  . DEPRESSION/ANXIETY  . CONSTIPATION  . DEGENERATIVE JOINT DISEASE, KNEES, BILATERAL  . SHOULDER PAIN, LEFT  . PATELLO-FEMORAL SYNDROME  . Lumbago  . FOOT PAIN, LEFT  . Medial meniscus, posterior horn derangement  . Stiffness of knee joint  . S/P arthroscopy of left knee  . Lateral meniscus tear  . Osteoarthritis of left knee  . Arthritis of knee, degenerative  . S/P total knee replacement  . Stiffness of joint, not elsewhere classified, lower leg  . Weakness of left leg  . Difficulty walking    PT - End of Session Activity Tolerance: Patient tolerated treatment well General Behavior During Session: [facility] for tasks performed Cognition: [facility] for tasks performed  GP    Juel Burrow 05/07/2012, 10:45 AM

## 2012-05-08 ENCOUNTER — Telehealth: Payer: Self-pay | Admitting: Orthopedic Surgery

## 2012-05-08 NOTE — Telephone Encounter (Signed)
Patient called to relay that she is continuing the therapy as recommended, and is still having much tightness in the operative knee (left), status/post total knee of 03/31/12.  Her next scheduled appointment is Tuesday, 05/13/12.  Her next therapy visit is tomorrow, 05/09/12.  Please advise.  Ph [phone] (Home)

## 2012-05-08 NOTE — Telephone Encounter (Signed)
Called back to patient.  Relayed- also confirmed keeping scheduled appointment.

## 2012-05-08 NOTE — Telephone Encounter (Signed)
NO ADVICE SO NOTED

## 2012-05-09 ENCOUNTER — Ambulatory Visit (HOSPITAL_COMMUNITY)
Admission: RE | Admit: 2012-05-09 | Discharge: 2012-05-09 | Disposition: A | Discharge status: Home / Self Care | Payer: BC Managed Care – PPO | Source: Ambulatory Visit | Attending: Orthopedic Surgery | Admitting: Orthopedic Surgery

## 2012-05-09 DIAGNOSIS — R29898 Other symptoms and signs involving the musculoskeletal system: Secondary | ICD-10-CM

## 2012-05-09 DIAGNOSIS — M25669 Stiffness of unspecified knee, not elsewhere classified: Secondary | ICD-10-CM

## 2012-05-09 DIAGNOSIS — R262 Difficulty in walking, not elsewhere classified: Secondary | ICD-10-CM

## 2012-05-09 NOTE — Progress Notes (Signed)
Physical Therapy Treatment Patient Details  Name: CARLENA RUYBAL MRN: [id number] Date of Birth: 02-11-1966  Today's Date: 05/09/2012 Time: 0925-1043 PT Time Calculation (min): 78 min Charge: manual 30', ES x1i, ice x 1, therex 35'  Visit#: 7 of 18  Re-eval: 05/04/12 (Next session prior MD apt) Assessment Diagnosis: L TKR Surgical Date: 03/31/12 Next MD Visit: Romeo Apple 05/12/2012 Prior Therapy: HH  Authorization: BCBS  Authorization Time Period:    Authorization Visit#: 7 of 12   Subjective: Symptoms/Limitations Symptoms: Pr reported increased swelling last night, pt feels like her ROM should have improved since she has been coming to PT, stated she feels loose when she lives here and it just tightens up with walking Pain Assessment Currently in Pain?: Yes Pain Score:   5 Pain Location: Knee Pain Orientation: Left  Objective:   Exercise/Treatments Stretches Passive Hamstring Stretch: 3 reps;30 seconds;Limitations Passive Hamstring Stretch Limitations: with rope Quad Stretch: 3 reps;30 seconds Aerobic Stationary Bike: 10' rocking Standing: Knee flexion 10 reps Seated Other Seated Knee Exercises: Biodex 100% extension; 75% flexion    Supine Quad Sets: 15 reps;Limitations Short Arc Quad Sets: 15 reps Terminal Knee Extension: 10 reps Straight Leg Raises: 2 sets;10 reps;Limitations Straight Leg Raises Limitations: lacking Knee Extension: PROM Knee Flexion: PROM Prone  Hamstring Curl: 10 reps Contract/Relax to Increase Flexion: 5 sets   Modalities Modalities: Cryotherapy;Electrical Stimulation Manual Therapy Manual Therapy: Joint mobilization Joint Mobilization: patella mobs all directions; Grade I-III tib/fib A/P to improve ROM Myofascial Release: to scar tissue to decrease tightness and adhesions Cryotherapy Number Minutes Cryotherapy: 10 Minutes Cryotherapy Location: Knee Type of Cryotherapy: Ice pack Pharmacologist  Location: L knee Electrical Stimulation Action: Hi volt with LE elevated and ice  Electrical Stimulation Parameters: 100 volts hi volt Electrical Stimulation Goals: Edema;Pain  Physical Therapy Assessment and Plan PT Assessment and Plan Clinical Impression Statement: Added prone contract relax to improve knee flexion.  Manual joint mobs and scar tissue complete to reduce adhesions for improved ROM.  Overall distal quad contraction improving, less tactile cueing for activation, pt stilll lacking 4 degrees with SLR due to quad weakness.  Added hi volt estim for edema control and pain reduced at end of session. PT Plan: Re-eval prior MD apt     Goals    Problem List Patient Active Problem List  Diagnosis  . OVERWEIGHT  . DEPRESSION/ANXIETY  . CONSTIPATION  . DEGENERATIVE JOINT DISEASE, KNEES, BILATERAL  . SHOULDER PAIN, LEFT  . PATELLO-FEMORAL SYNDROME  . Lumbago  . FOOT PAIN, LEFT  . Medial meniscus, posterior horn derangement  . Stiffness of knee joint  . S/P arthroscopy of left knee  . Lateral meniscus tear  . Osteoarthritis of left knee  . Arthritis of knee, degenerative  . S/P total knee replacement  . Stiffness of joint, not elsewhere classified, lower leg  . Weakness of left leg  . Difficulty walking    PT - End of Session Activity Tolerance: Patient tolerated treatment well General Behavior During Session: [facility] for tasks performed Cognition: [facility] for tasks performed  GP    Juel Burrow 05/09/2012, 10:51 AM

## 2012-05-12 ENCOUNTER — Ambulatory Visit (HOSPITAL_COMMUNITY)
Admission: RE | Admit: 2012-05-12 | Discharge: 2012-05-12 | Disposition: A | Discharge status: Home / Self Care | Payer: BC Managed Care – PPO | Source: Ambulatory Visit | Attending: Orthopedic Surgery | Admitting: Orthopedic Surgery

## 2012-05-12 NOTE — Evaluation (Signed)
Physical Therapy Assessment Patient Details  Name: Erica Graves MRN: [id number] Date of Birth: 06/17/1965  Today's Date: 05/12/2012 Time: 1020-1104 PT Time Calculation (min): 44 min              Visit#: 8 of 18  Re-eval: 06/09/12 (Next session prior MD apt) Charges: MMT x 1 ROMM x 1 Therex x 30'  Authorization: BCBS    Authorization Visit#: 8 of 12   Past Medical History:  Past Medical History  Diagnosis Date  . Obesity   . Chronic low back pain   . Depression with anxiety   . Constipation   . Kidney stone    Past Surgical History:  Past Surgical History  Procedure Laterality Date  . Left knee arthroscopy  08/14/2005 Dr. Romeo Apple torn meniscus  . Knee arthroscopy    . Partial hysterectomy    . Lumbar spine surgery    . Back surgery    . Knee arthroscopy  05/21/2011    Procedure: ARTHROSCOPY KNEE;  Surgeon: Vickki Hearing, MD;  Location: [facility];  Service: Orthopedics;  Laterality: Left;  lateral menisectomy  . Abdominal hysterectomy    . Total knee arthroplasty  03/31/2012    Procedure: TOTAL KNEE ARTHROPLASTY;  Surgeon: Vickki Hearing, MD;  Location: [facility];  Service: Orthopedics;  Laterality: Left;  Left Total Knee Arthroplasty    Subjective Symptoms/Limitations Symptoms: Pt states that she has been riding her exercises bike at home and is able to complete full revolutions on it.  Pain Assessment Currently in Pain?: No/denies  Assessment LLE AROM (degrees) Left Knee Extension: 0 Left Knee Flexion: 70 LLE Strength Left Hip Flexion: 4/5 (with vc's for proper mm recruitment (was 1/5)) Left Hip Extension: 5/5 (was 4/5) Left Hip ABduction: 5/5 Left Hip ADduction: 5/5 (was 3/5) Left Knee Flexion: 4/5 (was 3+/5) Left Knee Extension: 3+/5 (was 1/5)  Exercise/Treatments Aerobic Stationary Bike: 8' rocking at seat 12 Seated Other Seated Knee Exercises: Biodex 75% flexion  5x30" holds Supine Quad Sets: 15 reps;Limitations Short Arc Quad Sets: 15  reps Terminal Knee Extension: 10 reps Straight Leg Raises: AROM Straight Leg Raises Limitations: 10 degree extensor lag Knee Flexion: PROM  Physical Therapy Assessment and Plan PT Assessment and Plan Clinical Impression Statement: Pt displays significant gains in LLE strength though she continues to require vc's for proper mm recruitment. ROM is still very limited. Both AROM and PROM were 0-70 with a hard end feel for flexion. Pt would benefit from continuing skilled therapy to improve remaining deficits. PT Plan: Recommend to continue PT 3 x a week x 4 weeks. Also, recommend JAS brace to increase flexion.     Problem List Patient Active Problem List  Diagnosis  . OVERWEIGHT  . DEPRESSION/ANXIETY  . CONSTIPATION  . DEGENERATIVE JOINT DISEASE, KNEES, BILATERAL  . SHOULDER PAIN, LEFT  . PATELLO-FEMORAL SYNDROME  . Lumbago  . FOOT PAIN, LEFT  . Medial meniscus, posterior horn derangement  . Stiffness of knee joint  . S/P arthroscopy of left knee  . Lateral meniscus tear  . Osteoarthritis of left knee  . Arthritis of knee, degenerative  . S/P total knee replacement  . Stiffness of joint, not elsewhere classified, lower leg  . Weakness of left leg  . Difficulty walking    PT - End of Session Activity Tolerance: Patient tolerated treatment well General Behavior During Session: [facility] for tasks performed Cognition: [facility] for tasks performed  Seth Bake, PTA 05/12/2012, 11:43 AM  Physician Documentation Your  signature is required to indicate approval of the treatment plan as stated above.  Please sign and either send electronically or make a copy of this report for your files and return this physician signed original.   Please mark one 1.__approve of plan  2. ___approve of plan with the following conditions.   ______________________________                                                          _____________________ Physician Signature                                                                                                              Date

## 2012-05-13 ENCOUNTER — Ambulatory Visit (INDEPENDENT_AMBULATORY_CARE_PROVIDER_SITE_OTHER): Payer: BC Managed Care – PPO | Admitting: Orthopedic Surgery

## 2012-05-13 VITALS — BP 140/82 | Ht 66.0 in | Wt 200.0 lb

## 2012-05-13 DIAGNOSIS — T8489XA Other specified complication of internal orthopedic prosthetic devices, implants and grafts, initial encounter: Secondary | ICD-10-CM

## 2012-05-13 DIAGNOSIS — M25669 Stiffness of unspecified knee, not elsewhere classified: Secondary | ICD-10-CM

## 2012-05-13 NOTE — Progress Notes (Signed)
Patient ID: Erica Graves, female   DOB: Feb 23, 1966, 47 y.o.   MRN: [id number] Chief Complaint  Patient presents with  . Follow-up    Post op recheck on left TKA. DOS 03-31-12.    History the patient is now 6 weeks status post left total knee arthroplasty she is not progressing with her flexion  She will need manipulation under anesthesia we discussed the potential for fracture and its complications although we are not anticipating that  Shoulder manipulation under anesthesia left knee 40981 CPT code  In the office today with gravity assisted flexion sitting on the side of the bed she did regain about 20 of flexion so I anticipate that she will deal to do well with manipulation  She has a straight leg raise with a slight extensor lag her extensor mechanism and has responded nicely to steroids and therapy  We will dictated history and physical at later date

## 2012-05-13 NOTE — Patient Instructions (Addendum)
Surgery: Manipulation of left total knee /March 28th   April 2-May 2nd

## 2012-05-14 ENCOUNTER — Other Ambulatory Visit: Payer: Self-pay | Admitting: *Deleted

## 2012-05-14 ENCOUNTER — Ambulatory Visit (HOSPITAL_COMMUNITY): Payer: BC Managed Care – PPO | Admitting: Physical Therapy

## 2012-05-15 ENCOUNTER — Telehealth: Payer: Self-pay | Admitting: Orthopedic Surgery

## 2012-05-15 NOTE — Telephone Encounter (Signed)
Contacted insurer [employer] at [phone] regarding surgery scheduled for 05/26/12 at [facility] (admit following procedure), CPT [phone].  Reached automated voice response system, left message w/Utilization Department, + attempted online process.

## 2012-05-16 ENCOUNTER — Encounter (HOSPITAL_COMMUNITY): Payer: Self-pay | Admitting: Pharmacy Technician

## 2012-05-16 ENCOUNTER — Ambulatory Visit (HOSPITAL_COMMUNITY): Payer: BC Managed Care – PPO

## 2012-05-16 NOTE — Telephone Encounter (Signed)
05/16/12 Received call back from [facility] representative, Reche Dixon, requesting clinicals for review.  Reference to pre-auth # [id number] (status pending), faxed to # [phone].

## 2012-05-19 ENCOUNTER — Encounter (HOSPITAL_COMMUNITY)
Admission: RE | Admit: 2012-05-19 | Discharge: 2012-05-19 | Disposition: A | Discharge status: Home / Self Care | Payer: BC Managed Care – PPO | Source: Ambulatory Visit | Attending: Orthopedic Surgery | Admitting: Orthopedic Surgery

## 2012-05-19 ENCOUNTER — Encounter (HOSPITAL_COMMUNITY): Payer: Self-pay

## 2012-05-19 LAB — BASIC METABOLIC PANEL
CO2: 26 mEq/L (ref 19–32)
Glucose, Bld: 110 mg/dL — ABNORMAL HIGH (ref 70–99)
Potassium: 3.8 mEq/L (ref 3.5–5.1)
Sodium: 140 mEq/L (ref 135–145)

## 2012-05-19 LAB — SURGICAL PCR SCREEN
MRSA, PCR: NEGATIVE
Staphylococcus aureus: NEGATIVE

## 2012-05-19 LAB — HEMOGLOBIN AND HEMATOCRIT, BLOOD: HCT: 38.9 % (ref 36.0–46.0)

## 2012-05-19 NOTE — Patient Instructions (Addendum)
    SAMHITA KRETSCH  05/19/2012   Your procedure is scheduled on:  05/26/2012  Report to [facility] at  850  AM.  Call this number if you have problems the morning of surgery: [phone]   Remember:   Do not eat food or drink liquids after midnight.   Take these medicines the morning of surgery with A SIP OF WATER: robaxin,percocet,prednisone   Do not wear jewelry, make-up or nail polish.  Do not wear lotions, powders, or perfumes.   Do not shave 48 hours prior to surgery. Men may shave face and neck.  Do not bring valuables to the hospital.  Contacts, dentures or bridgework may not be worn into surgery.  Leave suitcase in the car. After surgery it may be brought to your room.  For patients admitted to the hospital, checkout time is 11:00 AM the day of discharge.   Patients discharged the day of surgery will not be allowed to drive  home.  Name and phone number of your driver: family  Special Instructions: Shower using CHG 2 nights before surgery and the night before surgery.  If you shower the day of surgery use CHG.  Use special wash - you have one bottle of CHG for all showers.  You should use approximately 1/3 of the bottle for each shower.   Please read over the following fact sheets that you were given: Pain Booklet, Coughing and Deep Breathing, MRSA Information, Surgical Site Infection Prevention, Anesthesia Post-op Instructions and Care and Recovery After Surgery PATIENT INSTRUCTIONS POST-ANESTHESIA  IMMEDIATELY FOLLOWING SURGERY:  Do not drive or operate machinery for the first twenty four hours after surgery.  Do not make any important decisions for twenty four hours after surgery or while taking narcotic pain medications or sedatives.  If you develop intractable nausea and vomiting or a severe headache please notify your doctor immediately.  FOLLOW-UP:  Please make an appointment with your surgeon as instructed. You do not need to follow up with anesthesia unless specifically  instructed to do so.  WOUND CARE INSTRUCTIONS (if applicable):  Keep a dry clean dressing on the anesthesia/puncture wound site if there is drainage.  Once the wound has quit draining you may leave it open to air.  Generally you should leave the bandage intact for twenty four hours unless there is drainage.  If the epidural site drains for more than 36-48 hours please call the anesthesia department.  QUESTIONS?:  Please feel free to call your physician or the hospital operator if you have any questions, and they will be happy to assist you.

## 2012-05-19 NOTE — Telephone Encounter (Signed)
Received call back from Franchot Mimes, [employer] utilization review, with authorization, ref# [id number], for up to 48-hour observation stay for surgery date 05/26/12 as noted:; if additional days are needed, hospital will be requested to forward clinicals.

## 2012-05-23 NOTE — OR Nursing (Signed)
Patient aware to be here at [facility]

## 2012-05-25 NOTE — H&P (Signed)
Erica Graves is an 47 y.o. female.   Chief Complaint: stiffness after total knee replacement  HPI:            History the patient is now 6 weeks status post left total knee arthroplasty she is not progressing with her flexion  She will need manipulation under anesthesia we discussed the potential for fracture and its complications although we are not anticipating that  manipulation under anesthesia left knee 95621 CPT code  In the office today with gravity assisted flexion sitting on the side of the bed she did regain about 20 of flexion so I anticipate that she will do well with manipulation  She has a straight leg raise with a slight extensor lag her extensor mechanism and has responded nicely to steroids and therapy  Pain is not an issue and ambulation has progressed normally   Past Medical History  Diagnosis Date  . Obesity   . Chronic low back pain   . Depression with anxiety   . Constipation   . Kidney stone     Past Surgical History  Procedure Laterality Date  . Left knee arthroscopy  08/14/2005 Dr. Romeo Apple torn meniscus  . Knee arthroscopy    . Partial hysterectomy    . Lumbar spine surgery    . Back surgery    . Knee arthroscopy  05/21/2011    Procedure: ARTHROSCOPY KNEE;  Surgeon: Vickki Hearing, MD;  Location: [facility];  Service: Orthopedics;  Laterality: Left;  lateral menisectomy  . Abdominal hysterectomy    . Total knee arthroplasty  03/31/2012    Procedure: TOTAL KNEE ARTHROPLASTY;  Surgeon: Vickki Hearing, MD;  Location: [facility];  Service: Orthopedics;  Laterality: Left;  Left Total Knee Arthroplasty    No family history on file. Social History:  reports that she has never smoked. She does not have any smokeless tobacco history on file. She reports that she does not drink alcohol or use illicit drugs.  Allergies: No Known Allergies  No prescriptions prior to admission    No results found for this or any previous visit (from the past 48 hour(s)). No  results found.  Review of Systems  Musculoskeletal:       Joint stiffness  All other systems reviewed and are negative.    There were no vitals taken for this visit. Physical Exam  Constitutional: She is oriented to person, place, and time. She appears well-developed and well-nourished.  HENT:  Head: Normocephalic.  Eyes: Pupils are equal, round, and reactive to light.  Neck: Normal range of motion.  Cardiovascular: Normal rate.   Respiratory: Effort normal.  GI: Soft.  Musculoskeletal: Normal range of motion.  Upper extremity exam  The right and left upper extremity:  Inspection and palpation revealed no abnormalities in the upper extremities.  Range of motion is full without contracture. Motor exam is normal with grade 5 strength. The joints are fully reduced without subluxation. There is no atrophy or tremor and muscle tone is normal.  All joints are stable.     Neurological: She is alert and oriented to person, place, and time. She has normal reflexes.  Skin: Skin is warm.  Psychiatric: She has a normal mood and affect.    Right knee normal exam   Left knee AROM = 70 PROM = 90  Incision normal  SLR 15 -20 ext lag  Joint stability confirmed   Assessment/Plan Chief Complaint   Patient presents with   .  Follow-up  Post op recheck on left TKA. DOS 03-31-12.   History the patient is now 6 weeks status post left total knee arthroplasty she is not progressing with her flexion  She will need manipulation under anesthesia we discussed the potential for fracture and its complications although we are not anticipating that  Shoulder manipulation under anesthesia left knee 45409 CPT code  In the office today with gravity assisted flexion sitting on the side of the bed she did regain about 20 of flexion so I anticipate that she will deal to do well with manipulation  She has a straight leg raise with a slight extensor lag her extensor mechanism and has responded nicely to  steroids and therapy   Manipulation left knee for arthrofibrosis   Fuller Canada 05/25/2012, 5:09 PM

## 2012-05-26 ENCOUNTER — Ambulatory Visit (HOSPITAL_COMMUNITY): Payer: BC Managed Care – PPO | Admitting: Anesthesiology

## 2012-05-26 ENCOUNTER — Encounter (HOSPITAL_COMMUNITY): Payer: Self-pay | Admitting: *Deleted

## 2012-05-26 ENCOUNTER — Encounter (HOSPITAL_COMMUNITY): Payer: Self-pay | Admitting: Anesthesiology

## 2012-05-26 ENCOUNTER — Observation Stay (HOSPITAL_COMMUNITY)
Admission: RE | Admit: 2012-05-26 | Discharge: 2012-05-28 | Disposition: A | Discharge status: Home / Self Care | Payer: BC Managed Care – PPO | Source: Ambulatory Visit | Attending: Orthopedic Surgery | Admitting: Orthopedic Surgery

## 2012-05-26 ENCOUNTER — Encounter (HOSPITAL_COMMUNITY)
Admission: RE | Disposition: A | Discharge status: Home / Self Care | Payer: Self-pay | Source: Ambulatory Visit | Attending: Orthopedic Surgery

## 2012-05-26 DIAGNOSIS — Z01812 Encounter for preprocedural laboratory examination: Secondary | ICD-10-CM | POA: Insufficient documentation

## 2012-05-26 DIAGNOSIS — Z01810 Encounter for preprocedural cardiovascular examination: Secondary | ICD-10-CM | POA: Insufficient documentation

## 2012-05-26 DIAGNOSIS — M24569 Contracture, unspecified knee: Secondary | ICD-10-CM | POA: Diagnosis present

## 2012-05-26 DIAGNOSIS — M24669 Ankylosis, unspecified knee: Principal | ICD-10-CM | POA: Insufficient documentation

## 2012-05-26 HISTORY — PX: KNEE CLOSED REDUCTION: SHX995

## 2012-05-26 SURGERY — MANIPULATION, KNEE, CLOSED
Anesthesia: General | Site: Knee | Laterality: Left

## 2012-05-26 MED ORDER — ASPIRIN EC 325 MG PO TBEC
325.0000 mg | DELAYED_RELEASE_TABLET | Freq: Every day | ORAL | Status: DC
  Administered 2012-05-27 – 2012-05-28 (×2): 325 mg via ORAL
  Filled 2012-05-26 (×2): qty 1

## 2012-05-26 MED ORDER — ONDANSETRON HCL 4 MG PO TABS: 4.0000 mg | ORAL_TABLET | Freq: Four times a day (QID) | ORAL | Status: DC | PRN

## 2012-05-26 MED ORDER — PHENOL 1.4 % MT LIQD: 1.0000 | OROMUCOSAL | Status: DC | PRN

## 2012-05-26 MED ORDER — ONDANSETRON HCL 4 MG/2ML IJ SOLN: 4.0000 mg | Freq: Four times a day (QID) | INTRAMUSCULAR | Status: DC | PRN

## 2012-05-26 MED ORDER — FENTANYL CITRATE 0.05 MG/ML IJ SOLN
25.0000 ug | INTRAMUSCULAR | Status: DC | PRN
  Administered 2012-05-26 (×3): 50 ug via INTRAVENOUS

## 2012-05-26 MED ORDER — PROPOFOL 10 MG/ML IV EMUL
INTRAVENOUS | Status: AC
  Filled 2012-05-26: qty 20

## 2012-05-26 MED ORDER — PROPOFOL 10 MG/ML IV BOLUS
INTRAVENOUS | Status: DC | PRN
  Administered 2012-05-26: 150 mg via INTRAVENOUS

## 2012-05-26 MED ORDER — ACETAMINOPHEN 10 MG/ML IV SOLN
1000.0000 mg | Freq: Four times a day (QID) | INTRAVENOUS | Status: AC
  Administered 2012-05-26 – 2012-05-27 (×3): 1000 mg via INTRAVENOUS
  Filled 2012-05-26 (×3): qty 100

## 2012-05-26 MED ORDER — BISACODYL 5 MG PO TBEC: 5.0000 mg | DELAYED_RELEASE_TABLET | Freq: Every day | ORAL | Status: DC | PRN

## 2012-05-26 MED ORDER — KETOROLAC TROMETHAMINE 30 MG/ML IJ SOLN
30.0000 mg | Freq: Once | INTRAMUSCULAR | Status: AC
  Administered 2012-05-26: 30 mg via INTRAVENOUS

## 2012-05-26 MED ORDER — DIAZEPAM 5 MG PO TABS
ORAL_TABLET | ORAL | Status: AC
  Filled 2012-05-26: qty 2

## 2012-05-26 MED ORDER — MIDAZOLAM HCL 2 MG/2ML IJ SOLN
1.0000 mg | INTRAMUSCULAR | Status: DC | PRN
  Administered 2012-05-26 (×2): 2 mg via INTRAVENOUS

## 2012-05-26 MED ORDER — LIDOCAINE HCL (CARDIAC) 20 MG/ML IV SOLN
INTRAVENOUS | Status: DC | PRN
  Administered 2012-05-26: 30 mg via INTRAVENOUS

## 2012-05-26 MED ORDER — ACETAMINOPHEN 10 MG/ML IV SOLN
1000.0000 mg | Freq: Once | INTRAVENOUS | Status: AC
  Administered 2012-05-26: 1000 mg via INTRAVENOUS

## 2012-05-26 MED ORDER — ONDANSETRON HCL 4 MG/2ML IJ SOLN
4.0000 mg | Freq: Once | INTRAMUSCULAR | Status: AC
  Administered 2012-05-26: 4 mg via INTRAVENOUS

## 2012-05-26 MED ORDER — ALUM & MAG HYDROXIDE-SIMETH 200-200-20 MG/5ML PO SUSP: 30.0000 mL | ORAL | Status: DC | PRN

## 2012-05-26 MED ORDER — METOCLOPRAMIDE HCL 10 MG PO TABS: 5.0000 mg | ORAL_TABLET | Freq: Three times a day (TID) | ORAL | Status: DC | PRN

## 2012-05-26 MED ORDER — DOCUSATE SODIUM 100 MG PO CAPS
100.0000 mg | ORAL_CAPSULE | Freq: Two times a day (BID) | ORAL | Status: DC
  Administered 2012-05-26 – 2012-05-28 (×5): 100 mg via ORAL
  Filled 2012-05-26 (×5): qty 1

## 2012-05-26 MED ORDER — ACETAMINOPHEN 10 MG/ML IV SOLN
INTRAVENOUS | Status: AC
  Filled 2012-05-26: qty 100

## 2012-05-26 MED ORDER — DIAZEPAM 5 MG PO TABS
10.0000 mg | ORAL_TABLET | Freq: Four times a day (QID) | ORAL | Status: DC
  Administered 2012-05-26 – 2012-05-28 (×8): 10 mg via ORAL
  Filled 2012-05-26 (×3): qty 2
  Filled 2012-05-26: qty 1
  Filled 2012-05-26: qty 2
  Filled 2012-05-26: qty 1
  Filled 2012-05-26 (×3): qty 2

## 2012-05-26 MED ORDER — CHLORHEXIDINE GLUCONATE 4 % EX LIQD: 60.0000 mL | Freq: Once | CUTANEOUS | Status: DC

## 2012-05-26 MED ORDER — ONDANSETRON HCL 4 MG/2ML IJ SOLN: 4.0000 mg | Freq: Once | INTRAMUSCULAR | Status: DC | PRN

## 2012-05-26 MED ORDER — KETOROLAC TROMETHAMINE 30 MG/ML IJ SOLN
15.0000 mg | Freq: Four times a day (QID) | INTRAMUSCULAR | Status: AC
  Administered 2012-05-26 – 2012-05-27 (×4): 15 mg via INTRAVENOUS
  Filled 2012-05-26 (×4): qty 1

## 2012-05-26 MED ORDER — ONDANSETRON HCL 4 MG/2ML IJ SOLN
INTRAMUSCULAR | Status: AC
  Filled 2012-05-26: qty 2

## 2012-05-26 MED ORDER — SENNA 8.6 MG PO TABS
1.0000 | ORAL_TABLET | Freq: Two times a day (BID) | ORAL | Status: DC
  Administered 2012-05-26 – 2012-05-28 (×5): 8.6 mg via ORAL
  Filled 2012-05-26 (×5): qty 1

## 2012-05-26 MED ORDER — SCOPOLAMINE 1 MG/3DAYS TD PT72
MEDICATED_PATCH | TRANSDERMAL | Status: AC
  Filled 2012-05-26: qty 1

## 2012-05-26 MED ORDER — SCOPOLAMINE 1 MG/3DAYS TD PT72
1.0000 | MEDICATED_PATCH | Freq: Once | TRANSDERMAL | Status: DC
  Administered 2012-05-26: 1.5 mg via TRANSDERMAL

## 2012-05-26 MED ORDER — FENTANYL CITRATE 0.05 MG/ML IJ SOLN
INTRAMUSCULAR | Status: AC
  Filled 2012-05-26: qty 2

## 2012-05-26 MED ORDER — MENTHOL 3 MG MT LOZG: 1.0000 | LOZENGE | OROMUCOSAL | Status: DC | PRN

## 2012-05-26 MED ORDER — MAGNESIUM CITRATE PO SOLN: 1.0000 | Freq: Once | ORAL | Status: AC | PRN

## 2012-05-26 MED ORDER — LACTATED RINGERS IV SOLN
INTRAVENOUS | Status: DC
Start: 2012-05-26 — End: 2012-05-26
  Administered 2012-05-26: 07:00:00 via INTRAVENOUS

## 2012-05-26 MED ORDER — LIDOCAINE HCL (PF) 1 % IJ SOLN
INTRAMUSCULAR | Status: AC
  Filled 2012-05-26: qty 5

## 2012-05-26 MED ORDER — OXYCODONE HCL ER 10 MG PO T12A
10.0000 mg | EXTENDED_RELEASE_TABLET | Freq: Two times a day (BID) | ORAL | Status: DC
  Administered 2012-05-26 – 2012-05-28 (×5): 10 mg via ORAL
  Filled 2012-05-26 (×5): qty 1

## 2012-05-26 MED ORDER — DIPHENHYDRAMINE HCL 12.5 MG/5ML PO ELIX: 12.5000 mg | ORAL_SOLUTION | ORAL | Status: DC | PRN

## 2012-05-26 MED ORDER — OXYCODONE HCL 5 MG PO TABS: 5.0000 mg | ORAL_TABLET | ORAL | Status: DC | PRN

## 2012-05-26 MED ORDER — KETOROLAC TROMETHAMINE 30 MG/ML IJ SOLN
INTRAMUSCULAR | Status: AC
  Filled 2012-05-26: qty 1

## 2012-05-26 MED ORDER — MIDAZOLAM HCL 2 MG/2ML IJ SOLN
INTRAMUSCULAR | Status: AC
  Filled 2012-05-26: qty 2

## 2012-05-26 MED ORDER — HYDROMORPHONE HCL PF 1 MG/ML IJ SOLN: 1.0000 mg | INTRAMUSCULAR | Status: DC | PRN

## 2012-05-26 MED ORDER — DEXAMETHASONE SODIUM PHOSPHATE 4 MG/ML IJ SOLN
INTRAMUSCULAR | Status: AC
  Filled 2012-05-26: qty 1

## 2012-05-26 MED ORDER — FENTANYL CITRATE 0.05 MG/ML IJ SOLN
INTRAMUSCULAR | Status: DC | PRN
  Administered 2012-05-26 (×3): 50 ug via INTRAVENOUS

## 2012-05-26 MED ORDER — METOCLOPRAMIDE HCL 5 MG/ML IJ SOLN: 5.0000 mg | Freq: Three times a day (TID) | INTRAMUSCULAR | Status: DC | PRN

## 2012-05-26 MED ORDER — DIAZEPAM 5 MG PO TABS
10.0000 mg | ORAL_TABLET | Freq: Four times a day (QID) | ORAL | Status: DC
  Administered 2012-05-26: 10 mg via ORAL

## 2012-05-26 MED ORDER — SODIUM CHLORIDE 0.9 % IV SOLN
INTRAVENOUS | Status: DC
  Administered 2012-05-26 – 2012-05-28 (×3): via INTRAVENOUS

## 2012-05-26 MED ORDER — DEXAMETHASONE SODIUM PHOSPHATE 4 MG/ML IJ SOLN
4.0000 mg | Freq: Once | INTRAMUSCULAR | Status: AC
  Administered 2012-05-26: 4 mg via INTRAVENOUS

## 2012-05-26 SURGICAL SUPPLY — 1 items: KIT ROOM TURNOVER APOR (KITS) ×2 IMPLANT

## 2012-05-26 NOTE — Interval H&P Note (Signed)
History and Physical Interval Note:  05/26/2012 7:21 AM  Erica Graves  has presented today for surgery, with the diagnosis of arthrofibrosis left knee  The various methods of treatment have been discussed with the patient and family. After consideration of risks, benefits and other options for treatment, the patient has consented to  Procedure(s): CLOSED MANIPULATION KNEE (Left) as a surgical intervention .  The patient's history has been reviewed, patient examined, no change in status, stable for surgery.  I have reviewed the patient's chart and labs.  Questions were answered to the patient's satisfaction.     Fuller Canada

## 2012-05-26 NOTE — Anesthesia Preprocedure Evaluation (Signed)
Anesthesia Evaluation  Patient identified by MRN, date of birth, ID band Patient awake    Reviewed: Allergy & Precautions, H&P , NPO status , Patient's Chart, lab work & pertinent test results  History of Anesthesia Complications Negative for: history of anesthetic complications  Airway Mallampati: II      Dental  (+) Teeth Intact   Pulmonary  breath sounds clear to auscultation        Cardiovascular negative cardio ROS  Rhythm:Regular     Neuro/Psych PSYCHIATRIC DISORDERS Depression    GI/Hepatic negative GI ROS, Neg liver ROS,   Endo/Other  negative endocrine ROS  Renal/GU Renal disease     Musculoskeletal   Abdominal (+) + obese,  Abdomen: soft.    Peds  Hematology negative hematology ROS (+)   Anesthesia Other Findings   Reproductive/Obstetrics                           Anesthesia Physical Anesthesia Plan  ASA: I  Anesthesia Plan: General   Post-op Pain Management:    Induction: Intravenous  Airway Management Planned: LMA  Additional Equipment:   Intra-op Plan:   Post-operative Plan: Extubation in OR  Informed Consent: I have reviewed the patients History and Physical, chart, labs and discussed the procedure including the risks, benefits and alternatives for the proposed anesthesia with the patient or authorized representative who has indicated his/her understanding and acceptance.     Plan Discussed with:   Anesthesia Plan Comments:         Anesthesia Quick Evaluation

## 2012-05-26 NOTE — Progress Notes (Signed)
CPM applied to lt leg as ordered. Applied with assistance from [employer]. Pt tolerated well.

## 2012-05-26 NOTE — Brief Op Note (Signed)
05/26/2012  7:49 AM  PATIENT:  Erica Graves  47 y.o. female  PRE-OPERATIVE DIAGNOSIS:  arthrofibrosis left knee  POST-OPERATIVE DIAGNOSIS:  arthrofibrosis left knee  PROCEDURE:  Procedure(s): CLOSED MANIPULATION KNEE (Left)  Operative findings pre-op under anesthesia passive flexion 70 postop 125  Details of procedure. The patient was identified in the preoperative holding area and the left knee was confirmed as the surgical site and marked. Chart review was completed. The patient was taken to the operating room for general anesthesia. After successful general anesthesia with an LMA passive flexion of the knee was checked stability of the knee was checked. 70 of passive flexion was noted and the knee was stable. The knee was then manipulated with constant flexion pressure and easily flexed to 125  The knee was taken through a range of motion several times to confirm adequate flexion and the patient was reversed from anesthesia and taken to the recovery room in stable condition where she will be placed in a CPM machine  SURGEON:  Surgeon(s) and Role:    * Adriann Thau E Marqui Formby, MD - Primary  PHYSICIAN ASSISTANT:   ASSISTANTS: none   ANESTHESIA:   general  EBL:     BLOOD ADMINISTERED:none  DRAINS: none   LOCAL MEDICATIONS USED:  NONE  SPECIMEN:  No Specimen  DISPOSITION OF SPECIMEN:  N/A  COUNTS:  YES  TOURNIQUET:  * No tourniquets in log *  DICTATION: .Dragon Dictation  PLAN OF CARE: Admit for overnight observation  PATIENT DISPOSITION:  PACU - hemodynamically stable.   Delay start of Pharmacological VTE agent (>24hrs) due to surgical blood loss or risk of bleeding: no 

## 2012-05-26 NOTE — Anesthesia Postprocedure Evaluation (Signed)
  Anesthesia Post-op Note  Patient: Erica Graves  Procedure(s) Performed: Procedure(s): CLOSED MANIPULATION KNEE (Left)  Patient Location: PACU  Anesthesia Type:General  Level of Consciousness: sedated  Airway and Oxygen Therapy: Patient Spontanous Breathing and Patient connected to face mask oxygen  Post-op Pain: mild  Post-op Assessment: Post-op Vital signs reviewed, Patient's Cardiovascular Status Stable, Respiratory Function Stable, Patent Airway and No signs of Nausea or vomiting  Post-op Vital Signs: Reviewed and stable  Complications: No apparent anesthesia complications

## 2012-05-26 NOTE — Clinical Social Work Note (Signed)
CSW received referral for possible SNF. Pt evaluated by PT and recommendation is for outpatient PT followup. CSW signing off but can be reconsulted if needed.  Derenda Fennel, [place] [phone]

## 2012-05-26 NOTE — Transfer of Care (Addendum)
Immediate Anesthesia Transfer of Care Note  Patient: Erica Graves  Procedure(s) Performed: Procedure(s): CLOSED MANIPULATION KNEE (Left)  Patient Location: PACU  Anesthesia Type:General  Level of Consciousness: sedated  Airway & Oxygen Therapy: Patient Spontanous Breathing and Patient connected to face mask oxygen  Post-op Assessment: Report given to PACU RN  Post vital signs: Reviewed and stable  Complications: No apparent anesthesia complications

## 2012-05-26 NOTE — Progress Notes (Signed)
Subjective: Day of Surgery Procedure(s) (LRB): CLOSED MANIPULATION KNEE (Left) Patient reports pain as mild.    Objective: Vital signs in last 24 hours: Temp:  [97.3 F (36.3 C)-98.7 F (37.1 C)] 97.9 F (36.6 C) (03/31 1300) Pulse Rate:  [77-98] 82 (03/31 1300) Resp:  [14-20] 18 (03/31 1300) BP: (132-163)/(72-100) 132/83 mmHg (03/31 1300) SpO2:  [96 %-100 %] 100 % (03/31 1300) Weight:  [205 lb (92.987 kg)-229 lb 4.8 oz (104.01 kg)] 229 lb 4.8 oz (104.01 kg) (03/31 1029)  Intake/Output from previous day:   Intake/Output this shift: Total I/O In: 811 [P.O.:111; I.V.:700] Out: 1 [Urine:1]  No results found for this basename: HGB,  in the last 72 hours No results found for this basename: WBC, RBC, HCT, PLT,  in the last 72 hours No results found for this basename: NA, K, CL, CO2, BUN, CREATININE, GLUCOSE, CALCIUM,  in the last 72 hours No results found for this basename: LABPT, INR,  in the last 72 hours  Neurologically intact She reports the knee feels better   Assessment/Plan: Day of Surgery Procedure(s) (LRB): CLOSED MANIPULATION KNEE (Left) Up with therapy PROM AND AAROM  CPM 20 HRS PER DAY  I RESET CPM SETTINGS FOR 60-120   Erica Graves 05/26/2012, 5:43 PM

## 2012-05-26 NOTE — Evaluation (Signed)
Physical Therapy Evaluation Patient Details Name: Erica Graves MRN: [id number] DOB: October 06, 1965 Today's Date: 05/26/2012 Time: 4132-4401 PT Time Calculation (min): 33 min  PT Assessment / Plan / Recommendation Clinical Impression  Pt was seen for initial eval/tx following L knee manipulation under anaesthesia.  She is alert and oriented, feels exceedingly well and reports that her knee feels great with no pain.  She has been in the CPM since procedure, arriving in the room about 9"30 AM.  ROM of knee is 0-105 degrees AA both supine and sitting.  She resumed ther ex per protocol and ambulated 400' with no instability.  We will resume CPM at 5:30 PM tonight, giving her a 2 hour break.  Pt states that she will probably be resuming OP PT at d/c.  Of note, she is able to fully extend L knee with only minimal extensor lag.    PT Assessment  Patient needs continued PT services    Follow Up Recommendations  Outpatient PT    Does the patient have the potential to tolerate intense rehabilitation      Barriers to Discharge        Equipment Recommendations  None recommended by PT    Recommendations for Other Services     Frequency 7X/week    Precautions / Restrictions Precautions Precautions: None Restrictions Weight Bearing Restrictions: No   Pertinent Vitals/Pain       Mobility  Bed Mobility Bed Mobility: Supine to Sit;Sit to Supine Supine to Sit: 7: Independent Sit to Supine: 7: Independent Transfers Transfers: Sit to Stand;Stand to Sit Sit to Stand: 7: Independent Stand to Sit: 7: Independent Ambulation/Gait Ambulation/Gait Assistance: 7: Independent Ambulation Distance (Feet): 400 Feet Assistive device: None Gait Pattern: Within Functional Limits Gait velocity: WNL Stairs: No Wheelchair Mobility Wheelchair Mobility: No    Exercises Total Joint Exercises Ankle Circles/Pumps: PROM;Left;10 reps;Supine Quad Sets: AROM;Left;10 reps;Supine Short Arc Quad: AROM;Left;10  reps;Supine Heel Slides: AAROM;Left;10 reps;Supine Knee Flexion: AAROM;Left;10 reps;Supine Goniometric ROM: 0-105   PT Diagnosis:  (limitation of L knee ROM)  PT Problem List: Decreased range of motion PT Treatment Interventions: Therapeutic exercise   PT Goals Acute Rehab PT Goals PT Goal Formulation: With patient Time For Goal Achievement: 05/26/12 Potential to Achieve Goals: Good Pt will Perform Home Exercise Program: with supervision, verbal cues required/provided PT Goal: Perform Home Exercise Program - Progress: Goal set today  Visit Information  Last PT Received On: 05/26/12    Subjective Data  Subjective: Pt states that her knee feels MUCH better Patient Stated Goal: a fully functional L knee   Prior Functioning  Home Living Lives With: Spouse Available Help at Discharge: Family Type of Home: House Home Access: Level entry Home Layout: One level Firefighter: Standard Home Adaptive Equipment: Walker - rolling;Bedside commode/3-in-1;Straight cane Prior Function Level of Independence: Independent Able to Take Stairs?: Yes Driving: Yes Vocation: Full time employment Communication Communication: No difficulties    Cognition  Cognition Overall Cognitive Status: Appears within functional limits for tasks assessed/performed Arousal/Alertness: Awake/alert Orientation Level: Appears intact for tasks assessed Behavior During Session: [facility] for tasks performed    Extremity/Trunk Assessment Right Lower Extremity Assessment RLE ROM/Strength/Tone: Within functional levels RLE Sensation: WFL - Light Touch RLE Coordination: WFL - gross motor Left Lower Extremity Assessment LLE ROM/Strength/Tone: Within functional levels (ROM = 0-105 degrees supine and sitting, AA) LLE Sensation: WFL - Light Touch LLE Coordination: WFL - gross motor   Balance    End of Session PT - End of Session  Equipment Utilized During Treatment: Gait belt Activity Tolerance: Patient tolerated  treatment well Patient left: in chair;with call bell/phone within reach Nurse Communication: Mobility status CPM Left Knee CPM Left Knee: Off Additional Comments: pt plans to off of CPM for 2 hours, then RN will have it replaced (at 5:30 PM)  GP Functional Assessment Tool Used: clinical judgement Functional Limitation: Mobility: Walking and moving around Mobility: Walking and Moving Around Current Status (J4782): 0 percent impaired, limited or restricted Mobility: Walking and Moving Around Goal Status (N5621): 0 percent impaired, limited or restricted   Konrad Penta 05/26/2012, 3:54 PM

## 2012-05-26 NOTE — Anesthesia Procedure Notes (Addendum)
Procedure Name: LMA Insertion Date/Time: 05/26/2012 7:43 AM Performed by: Glynn Octave E Pre-anesthesia Checklist: Patient identified, Patient being monitored, Emergency Drugs available, Timeout performed and Suction available Patient Re-evaluated:Patient Re-evaluated prior to inductionOxygen Delivery Method: Circle System Utilized Preoxygenation: Pre-oxygenation with 100% oxygen Intubation Type: IV induction Ventilation: Mask ventilation without difficulty LMA: LMA inserted LMA Size: 4.0 Number of attempts: 1 Placement Confirmation: positive ETCO2 and breath sounds checked- equal and bilateral Comments: LMA #3 on first attempt, poor seal, LMA #4 with good results.

## 2012-05-26 NOTE — Op Note (Signed)
05/26/2012  7:49 AM  PATIENT:  Erica Graves  47 y.o. female  PRE-OPERATIVE DIAGNOSIS:  arthrofibrosis left knee  POST-OPERATIVE DIAGNOSIS:  arthrofibrosis left knee  PROCEDURE:  Procedure(s): CLOSED MANIPULATION KNEE (Left)  Operative findings pre-op under anesthesia passive flexion 70 postop 125  Details of procedure. The patient was identified in the preoperative holding area and the left knee was confirmed as the surgical site and marked. Chart review was completed. The patient was taken to the operating room for general anesthesia. After successful general anesthesia with an LMA passive flexion of the knee was checked stability of the knee was checked. 70 of passive flexion was noted and the knee was stable. The knee was then manipulated with constant flexion pressure and easily flexed to 125  The knee was taken through a range of motion several times to confirm adequate flexion and the patient was reversed from anesthesia and taken to the recovery room in stable condition where she will be placed in a CPM machine  SURGEON:  Surgeon(s) and Role:    * Vickki Hearing, MD - Primary  PHYSICIAN ASSISTANT:   ASSISTANTS: none   ANESTHESIA:   general  EBL:     BLOOD ADMINISTERED:none  DRAINS: none   LOCAL MEDICATIONS USED:  NONE  SPECIMEN:  No Specimen  DISPOSITION OF SPECIMEN:  N/A  COUNTS:  YES  TOURNIQUET:  * No tourniquets in log *  DICTATION: .Dragon Dictation  PLAN OF CARE: Admit for overnight observation  PATIENT DISPOSITION:  PACU - hemodynamically stable.   Delay start of Pharmacological VTE agent (>24hrs) due to surgical blood loss or risk of bleeding: no

## 2012-05-27 ENCOUNTER — Encounter (HOSPITAL_COMMUNITY): Payer: Self-pay | Admitting: Orthopedic Surgery

## 2012-05-27 NOTE — Progress Notes (Signed)
UR Chart Review Completed  

## 2012-05-27 NOTE — Progress Notes (Signed)
Physical Therapy Treatment Patient Details Name: Erica Graves MRN: [id number] DOB: 02-Aug-1965 Today's Date: 05/27/2012 Time: 4098-1191 PT Time Calculation (min): 40 min  PT Assessment / Plan / Recommendation Comments on Treatment Session  Pt continues to do well.  ROM after stretching is 3-120 degrees.  She has more soreness in knee this afternoon, but she is able to work through this.  She still has a slight decrease in full knee extension, so we did spend time on hamstring stretching.  She appears to understand the exercise program as well as self stretching.    Follow Up Recommendations        Does the patient have the potential to tolerate intense rehabilitation     Barriers to Discharge        Equipment Recommendations       Recommendations for Other Services    Frequency     Plan Discharge plan remains appropriate    Precautions / Restrictions     Pertinent Vitals/Pain     Mobility       Exercises Total Joint Exercises Quad Sets: AROM;Left;10 reps;Supine Short Arc Quad: AROM;Left;10 reps;Supine Heel Slides: PROM;AROM;Left;10 reps;Supine Straight Leg Raises: AROM;Left;10 reps;Supine Knee Flexion: PROM;AROM;Left;15 reps;Supine;Seated Goniometric ROM: 3-120 degrees   PT Diagnosis:    PT Problem List:   PT Treatment Interventions:     PT Goals Acute Rehab PT Goals Pt will Perform Home Exercise Program: with supervision, verbal cues required/provided PT Goal: Perform Home Exercise Program - Progress: Met  Visit Information  Last PT Received On: 05/27/12    Subjective Data  Subjective: my knee is a little sore   Cognition       Balance     End of Session PT - End of Session Equipment Utilized During Treatment: Gait belt Activity Tolerance: Patient tolerated treatment well Patient left: in bed;in CPM;with call bell/phone within reach CPM Left Knee CPM Left Knee: On   GP     Konrad Penta 05/27/2012, 1:47 PM

## 2012-05-27 NOTE — Anesthesia Postprocedure Evaluation (Signed)
  Anesthesia Post-op Note  Patient: Erica Graves  Procedure(s) Performed: Procedure(s): CLOSED MANIPULATION KNEE (Left)  Patient Location: room 321  Anesthesia Type:General  Level of Consciousness: awake, alert , oriented and patient cooperative  Airway and Oxygen Therapy: Patient Spontanous Breathing  Post-op Pain: none  Post-op Assessment: Post-op Vital signs reviewed, Patient's Cardiovascular Status Stable, Respiratory Function Stable, Patent Airway, No signs of Nausea or vomiting and Pain level controlled  Post-op Vital Signs: Reviewed and stable  Complications: No apparent anesthesia complications

## 2012-05-27 NOTE — Progress Notes (Signed)
Occupational Therapy Screen  OT orders received. Patient's chart reviewed. Spoke with patient regarding ADL performance and any concerns she may have at home as patient is currently Mod I and at baseline. Demonstrated AE use that may be beneficial to increase ADL performance with bathing and dressing and informed her where they may be purchased. At this time, patient has no concerns for discharge and does not require OT services at this time. Will sign off.   Limmie Patricia, OTR/L,CBIS  05/27/12 3:06PM

## 2012-05-27 NOTE — Progress Notes (Signed)
Subjective: 1 Day Post-Op Procedure(s) (LRB): CLOSED MANIPULATION KNEE (Left) Patient reports pain as mild.    Objective:  resting knee flexion at bedside is 90   Assessment/Plan: 1 Day Post-Op Procedure(s) (LRB): CLOSED MANIPULATION KNEE (Left) Advance diet Up with therapy 2 sessions of PT today  cpm x 20 hours today  Home in am   Fuller Canada 05/27/2012, 7:13 AM

## 2012-05-27 NOTE — Care Management Note (Signed)
    Page 1 of 2   05/28/2012     11:15:25 AM   CARE MANAGEMENT NOTE 05/28/2012  Patient:  Erica Graves, Erica Graves   Account Number:  [id number]  Date Initiated:  05/27/2012  Documentation initiated by:  Sharrie Rothman  Subjective/Objective Assessment:   Pt admitted from home s/p manipulation of left knee. Pt lives with her husband and 2 sons and will return home at discharge. Pt is already active with outpt PT at [facility] and will continue this at discharge. Pt has walker for home use.     Action/Plan:   Pt to discuss need and finanical liability of CPM with MD. CM will arrange CPM with pts DME facility if needed at discharge.   Anticipated DC Date:  05/28/2012   Anticipated DC Plan:  HOME/SELF CARE      DC Planning Services  CM consult      PAC Choice  DURABLE MEDICAL EQUIPMENT   Choice offered to / List presented to:  C-1 Patient   DME arranged  CPM      DME agency  [facility]        Status of service:  Completed, signed off Medicare Important Message given?   (If response is "NO", the following Medicare IM given date fields will be blank) Date Medicare IM given:   Date Additional Medicare IM given:    Discharge Disposition:  HOME/SELF CARE  Per UR Regulation:    If discussed at Long Length of Stay Meetings, dates discussed:    Comments:  05/28/12 1115 Arlyss Queen, RN BSN CM Pt discharged home today with outpt PT. Pts next PT appt is 05/30/12 at 1145. Pts CPM has been arranged with [facility] and Abby Johnson of [facility] is aware and will arrange for CPm to be delivered today. Pt and pts nurse aware of discharge arrangements.  05/27/12 1515 Arlyss Queen, RN BSN CM

## 2012-05-27 NOTE — Progress Notes (Signed)
Physical Therapy Treatment Patient Details Name: Erica Graves MRN: [id number] DOB: January 06, 1966 Today's Date: 05/27/2012 Time: 7829-5621 PT Time Calculation (min): 39 min  PT Assessment / Plan / Recommendation Comments on Treatment Session  Pt is doing exceedingly well.  She is able to flex knee independently to 105 degrees with no difficulty.  Passive knee flexion is 120 degrees with slight decrease in extension to -3 degrees.  We are continuing to work on strengthening of L quadricpes where she has a mild extensor lag of a couple of degrees.  Pt is ambulating with a normal gait pattern.    Follow Up Recommendations        Does the patient have the potential to tolerate intense rehabilitation     Barriers to Discharge        Equipment Recommendations       Recommendations for Other Services    Frequency     Plan Discharge plan remains appropriate;Frequency remains appropriate    Precautions / Restrictions     Pertinent Vitals/Pain     Mobility       Exercises Total Joint Exercises Quad Sets: AROM;Left;10 reps;Supine Short Arc Quad: AROM;Left;10 reps;Supine Heel Slides: AROM;PROM;Left;10 reps;Supine Straight Leg Raises: AROM;Left;10 reps;Supine Goniometric ROM: 3-120 degrees L knee   PT Diagnosis:    PT Problem List:   PT Treatment Interventions:     PT Goals Acute Rehab PT Goals PT Goal: Perform Home Exercise Program - Progress: Progressing toward goal  Visit Information  Last PT Received On: 05/27/12    Subjective Data  Subjective: I feel OK   Cognition       Balance     End of Session PT - End of Session Activity Tolerance: Patient tolerated treatment well Patient left: in bed;in CPM;with call bell/phone within reach Nurse Communication: Mobility status CPM Left Knee Left Knee Flexion (Degrees): 120 Left Knee Extension (Degrees): 60   GP     Erica Graves L 05/27/2012, 9:47 AM

## 2012-05-28 MED ORDER — OXYCODONE HCL 5 MG PO TABS: 5.0000 mg | ORAL_TABLET | ORAL | Status: DC | PRN

## 2012-05-28 MED ORDER — OXYCODONE HCL ER 10 MG PO T12A: 10.0000 mg | EXTENDED_RELEASE_TABLET | Freq: Two times a day (BID) | ORAL | Status: DC

## 2012-05-28 MED ORDER — DIAZEPAM 10 MG PO TABS: 10.0000 mg | ORAL_TABLET | Freq: Four times a day (QID) | ORAL | Status: DC

## 2012-05-28 NOTE — Discharge Summary (Signed)
Physician Discharge Summary  Patient ID: Erica Graves MRN: [id number] DOB/AGE: 1965/12/25 47 y.o.  Admit date: 05/26/2012 Discharge date: 05/28/2012  Admission Diagnoses:  The encounter diagnosis was Contracture of lower leg joint, left.  Discharge Diagnoses: The encounter diagnosis was Contracture of lower leg joint, left.  Active Problems:   * No active hospital problems. *   Discharged Condition: good  Hospital Course: MUA on 05/26/2012, PT went well, pain well controlled. ROM = 120   Consults: None  Significant Diagnostic Studies: none   Treatments: surgery: MUA LEFT KNEE , POST OP THERAPY CPM   Discharge Exam: Blood pressure 125/82, pulse 66, temperature 98.6 F (37 C), temperature source Oral, resp. rate 20, height 5\' 6"  (1.676 m), weight 229 lb 4.8 oz (104.01 kg), SpO2 100.00%. PROM = 120   Disposition: 06-Home-Health Care Svc  Discharge Orders   Future Appointments Provider Department Dept Phone   05/29/2012 12:00 PM Vickki Hearing, MD [facility] [phone]   Future Orders Complete By Expires     Ambulatory referral to Physical Therapy  As directed     Comments:      3 x per week   6 weeks   S/p MUA on 3/31    Questions:      Iontophoresis - 4 mg/ml of dexamethasone:  Yes    T.E.N.S. Unit Evaluation and Dispense as Indicated:  Yes    CPM  As directed     Comments:      Continuous passive motion machine (CPM):      Use the CPM from 0 to 110/120 for 12 hours per day.        You may break it up into 2 or 3 sessions per day.      Use CPM for 2 weeks or until you are told to stop.    Call MD / Call 911  As directed     Comments:      If you experience chest pain or shortness of breath, CALL 911 and be transported to the hospital emergency room.  If you develope a fever above 101 F, pus (white drainage) or increased drainage or redness at the wound, or calf pain, call your surgeon's office.    Constipation Prevention  As  directed     Comments:      Drink plenty of fluids.  Prune juice may be helpful.  You may use a stool softener, such as Colace (over the counter) 100 mg twice a day.  Use MiraLax (over the counter) for constipation as needed.    Diet - low sodium heart healthy  As directed     For home use only DME Continuous passive motion machine  As directed     Increase activity slowly as tolerated  As directed         Medication List    TAKE these medications       diazepam 10 MG tablet  Commonly known as:  VALIUM  Take 1 tablet (10 mg total) by mouth every 6 (six) hours.     OxyCODONE 10 mg T12a  Commonly known as:  OXYCONTIN  Take 1 tablet (10 mg total) by mouth every 12 (twelve) hours.     oxyCODONE 5 MG immediate release tablet  Commonly known as:  Oxy IR/ROXICODONE  Take 1 tablet (5 mg total) by mouth every 4 (four) hours as needed.           Follow-up Information   Follow  up with Fuller Canada, MD On 06/05/2012.   Contact information:   [address], [address] [place] [address] [phone]      NOTE YOUR APPT FOR April 3RD HAS BEEN CHANGED TO April 10 AT 12 PM  Signed: Fuller Canada 05/28/2012, 8:09 AM

## 2012-05-28 NOTE — Progress Notes (Signed)
IV removed, site WNL.  Pt given d/c instructions and new prescriptions.  Discussed home care with patient and discussed home medications, patient verbalizes understanding, teachback completed. F/U appointment in place with MD and with outpt PT, pt states they will keep appointment. HH has been arranged. CPM machine to be delivered. Pt is stable at this time. Pt taken to main entrance in wheelchair by staff member.

## 2012-05-29 ENCOUNTER — Telehealth: Payer: Self-pay | Admitting: Orthopedic Surgery

## 2012-05-29 ENCOUNTER — Ambulatory Visit: Payer: BC Managed Care – PPO | Admitting: Orthopedic Surgery

## 2012-05-29 NOTE — Telephone Encounter (Addendum)
Erica Graves said that [facility] said she cannot get another CPM machine because they had already picked up the one she had previously. [employer] will not cover another one this soon. Her # [phone]  Followup call from Angela/[facility],  The CPM machine will cost $25.00 a day and [name] does not wish to selfpay.

## 2012-05-30 ENCOUNTER — Ambulatory Visit (HOSPITAL_COMMUNITY)
Admit: 2012-05-30 | Discharge: 2012-05-30 | Disposition: A | Discharge status: Home / Self Care | Payer: BC Managed Care – PPO | Source: Ambulatory Visit | Attending: Orthopedic Surgery | Admitting: Orthopedic Surgery

## 2012-05-30 DIAGNOSIS — M25669 Stiffness of unspecified knee, not elsewhere classified: Secondary | ICD-10-CM

## 2012-05-30 DIAGNOSIS — R29898 Other symptoms and signs involving the musculoskeletal system: Secondary | ICD-10-CM

## 2012-05-30 DIAGNOSIS — M6281 Muscle weakness (generalized): Secondary | ICD-10-CM | POA: Insufficient documentation

## 2012-05-30 DIAGNOSIS — M25569 Pain in unspecified knee: Secondary | ICD-10-CM | POA: Insufficient documentation

## 2012-05-30 DIAGNOSIS — IMO0001 Reserved for inherently not codable concepts without codable children: Secondary | ICD-10-CM | POA: Insufficient documentation

## 2012-05-30 DIAGNOSIS — R262 Difficulty in walking, not elsewhere classified: Secondary | ICD-10-CM

## 2012-05-30 NOTE — Evaluation (Signed)
Physical Therapy reassessment Patient Details  Name: Erica Graves MRN: [id number] Date of Birth: [birth year]  Today's Date: 05/30/2012 Time: 1200-1245 PT Time Calculation (min): 45 min Charge:  ROM test, MM test, therapeutic exercise 23'             Visit#: 9 (( 1st treatment since MUA)) of 18  Re-eval: 06/29/12 Assessment Diagnosis: manipualtion Surgical Date: 05/26/12 Prior Therapy: HH, Out patient  Authorization: BC[employer]    Authorization Visit#: 9 of     Past Medical History:  Past Medical History  Diagnosis Date  . Obesity   . Chronic low back pain   . Depression with anxiety   . Constipation   . Kidney stone    Past Surgical History:  Past Surgical History  Procedure Laterality Date  . Left knee arthroscopy  08/14/2005 Dr. Romeo Apple torn meniscus  . Knee arthroscopy    . Partial hysterectomy    . Lumbar spine surgery    . Back surgery    . Knee arthroscopy  05/21/2011    Procedure: ARTHROSCOPY KNEE;  Surgeon: Vickki Hearing, MD;  Location: [facility];  Service: Orthopedics;  Laterality: Left;  lateral menisectomy  . Abdominal hysterectomy    . Total knee arthroplasty  03/31/2012    Procedure: TOTAL KNEE ARTHROPLASTY;  Surgeon: Vickki Hearing, MD;  Location: [facility];  Service: Orthopedics;  Laterality: Left;  Left Total Knee Arthroplasty  . Knee closed reduction Left 05/26/2012    Procedure: CLOSED MANIPULATION KNEE;  Surgeon: Vickki Hearing, MD;  Location: [facility];  Service: Orthopedics;  Laterality: Left;    Subjective Symptoms/Limitations Symptoms: Pt states that she has the mobility since the manipulation on Monday but she is very sore.  She is completing her exercises at home.  She is not having any difficulty at home everything is better.  How long can you sit comfortably?: Able to sit without difficulty How long can you stand comfortably?: I was able to cook and wash dishes without difficulty.   How long can you walk comfortably?: Pt is able to walk for about  45 minutes. Pain Assessment Currently in Pain?: No/denies (soreness only)  Assessment LLE AROM (degrees) Left Knee Extension: 7 Left Knee Flexion: 108 LLE PROM (degrees) Left Hip Extension: 5 Left Hip Flexion: 111 LLE Strength Left Hip Flexion: 5/5 (was 4/5 on 06/04/2012) Left Hip Extension: 5/5 Left Hip ABduction: 5/5 Left Hip ADduction: 5/5 Left Knee Flexion: 4/5 Left Knee Extension: 3+/5 (with terminal extension 2/5)  Exercise/Treatments     Aerobic Stationary Bike: Nu-Stepx 7'   Standing Knee Flexion: 10 reps;Limitations Knee Flexion Limitations: " step Terminal Knee Extension: 10 reps Seated Long Arc Quad: 10 reps;Limitations Long Arc Quad Limitations: pull back into flex Supine Quad Sets: 15 reps Heel Slides: 10 reps Knee Extension: PROM Knee Flexion: PROM   Prone  Hamstring Curl: 5 reps Contract/Relax to Increase Flexion: 5x    Physical Therapy Assessment and Plan PT Assessment and Plan Clinical Impression Statement: Pt was seen for manupulatiion undr anaesthesia on 05/26/2012.  She was discharged on 05/28/2012 and is now being referred to OP therapy to continue to gain ROM.  The patient has poor terminal extension strength in her quads but all other strength is good or normal.  PT will benefit from skilled PT to improve her ROM and terminal extension to allow pt to complete her normal functioning activities without difficulty. Pt will benefit from skilled therapeutic intervention in order to improve on the following deficits:  Decreased activity tolerance;Decreased range of motion;Decreased strength;Difficulty walking;Increased edema;Pain Rehab Potential: Good PT Frequency: Min 3X/week PT Duration: 4 weeks PT Treatment/Interventions: Gait training;Therapeutic activities;Therapeutic exercise;Modalities;Manual techniques PT Plan: Pt to work on terminal extension strength and ROM only.  No need for other strengthening exerciseis as they are all functional.     Goals Home Exercise Program Pt will Perform Home Exercise Program: Independently PT Short Term Goals Time to Complete Short Term Goals: 2 weeks PT Short Term Goal 1: Increased ROM to 5 to 115 to allow more normalized gt. PT Short Term Goal 2: Pt able to go up and down steps reciprocally PT Short Term Goal 3: Pt pain to be no greater than a 1  PT Long Term Goals Time to Complete Long Term Goals: 4 weeks PT Long Term Goal 1: I in [facility] PT Long Term Goal 2: Pt ROM to be 3 to 120 to allow pt to sit at movie theater with comfort. Long Term Goal 3: pt to be able to squat and rise from a squatted positon in order to complete house/yard work Long Term Goal 4: Pt to be able to be up for three hours to complete normal life activities without pain   Problem List Patient Active Problem List  Diagnosis  . OVERWEIGHT  . DEPRESSION/ANXIETY  . CONSTIPATION  . DEGENERATIVE JOINT DISEASE, KNEES, BILATERAL  . SHOULDER PAIN, LEFT  . PATELLO-FEMORAL SYNDROME  . Lumbago  . FOOT PAIN, LEFT  . Medial meniscus, posterior horn derangement  . Stiffness of knee joint  . S/P arthroscopy of left knee  . Lateral meniscus tear  . Osteoarthritis of left knee  . Arthritis of knee, degenerative  . S/P total knee replacement  . Stiffness of joint, not elsewhere classified, lower leg  . Weakness of left leg  . Difficulty walking       GP    Kelbie Moro,CINDY 05/30/2012, 1:48 PM  Physician Documentation Your signature is required to indicate approval of the treatment plan as stated above.  Please sign and either send electronically or make a copy of this report for your files and return this physician signed original.   Please mark one 1.__approve of plan  2. ___approve of plan with the following conditions.   ______________________________                                                          _____________________ Physician Signature                                                                                                              Date

## 2012-05-30 NOTE — Telephone Encounter (Signed)
[facility]

## 2012-06-02 NOTE — Telephone Encounter (Signed)
Tammy, will you call [facility]?   Thanks

## 2012-06-02 NOTE — Telephone Encounter (Signed)
Spoke with Bridgett at [facility] and she states the CPM is not covered by [employer] and it would be self pay if patient gets it. Left message for patient to call me back.

## 2012-06-03 ENCOUNTER — Encounter: Payer: Self-pay | Admitting: Orthopedic Surgery

## 2012-06-03 ENCOUNTER — Telehealth: Payer: Self-pay | Admitting: Orthopedic Surgery

## 2012-06-03 ENCOUNTER — Inpatient Hospital Stay (HOSPITAL_COMMUNITY)
Admission: RE | Admit: 2012-06-03 | Payer: BC Managed Care – PPO | Source: Ambulatory Visit | Admitting: Physical Therapy

## 2012-06-03 NOTE — Telephone Encounter (Signed)
Called back to patient.  Relayed.  Also updated work note accordingly.  Patient aware.

## 2012-06-03 NOTE — Telephone Encounter (Signed)
Patient called earlier today, 06/03/12, to relay that she had a little swelling in the knee (post op manipulation, procedure date 05/26/12.)  She relates that she has gone back to work, "sitting only, desk work").  Her original return to work date was tentative, 05/30/12, after her initial surgery (knee replacement, 03/31/12/). Her post op#1 appointment for the manipulation procedure is 06/05/12.  I offered appointment for today, and she is unable to come.  Asked about tomorrow, however, not available.  Please advise:  (1) Any recommendations for swelling?  (2) Okay to be working?  (3) Okay to keep Thursday 06/05/12 appointment? Ph# [phone]

## 2012-06-03 NOTE — Telephone Encounter (Signed)
Informed patient

## 2012-06-03 NOTE — Telephone Encounter (Signed)
ICE  OK TO SIT AT WORK   KEEP APPT

## 2012-06-04 ENCOUNTER — Ambulatory Visit (HOSPITAL_COMMUNITY)
Admission: RE | Admit: 2012-06-04 | Discharge: 2012-06-04 | Disposition: A | Discharge status: Home / Self Care | Payer: BC Managed Care – PPO | Source: Ambulatory Visit | Attending: Family Medicine | Admitting: Family Medicine

## 2012-06-04 NOTE — Progress Notes (Signed)
Physical Therapy Treatment Patient Details  Name: Erica Graves MRN: [id number] Date of Birth: 1965-12-14  Today's Date: 06/04/2012 Time: 4098-1191 PT Time Calculation (min): 52 min  Visit#: 10 (2nd treatment since manipulation) of 18  Re-eval: 06/29/12 Charges: Therex x 20' Manual x 10' Ice x 10'  Authorization: [employer]  Authorization Visit#: 10 of 18   Subjective: Symptoms/Limitations Symptoms: Pt states that she cannot seem to keep the swelling down despite icing 3-5 times a day.  Pain Assessment Currently in Pain?: No/denies   Exercise/Treatments Stretches Quad Stretch: 3 reps;30 seconds;Limitations Lobbyist Limitations: Chair stretch Aerobic Stationary Bike: Rec bike seat 9 x 8' full revolutions Seated Other Seated Knee Exercises: Biodex 85% flexion  10' with 3x30" holds at the end Supine Knee Extension: PROM Knee Flexion: PROM   Modalities Modalities: Cryotherapy;Electrical Stimulation Manual Therapy Manual Therapy: Joint mobilization Joint Mobilization: Grade I-III tib/fib A/P to improve ROM Myofascial Release: to quad to decrease tightness and adhesions Cryotherapy Number Minutes Cryotherapy: 10 Minutes Cryotherapy Location: Knee (Left) Type of Cryotherapy: Ice pack  Physical Therapy Assessment and Plan PT Assessment and Plan Clinical Impression Statement: Pt presents with decreased ROM this session. Order for JAS brace to increase flexion sent to MD. Swelling is present in left knee but not significant. Manual techniques completed to improve motion. Tightness appears to be in quad muscle rather than the knee joint. Good motion in joint noted with mobs. Ice applied at end of session to limit pain and inflammation. PT Plan: Pt to work on terminal extension strength and ROM only.  No need for other strengthening exerciseis as they are all functional.     Problem List Patient Active Problem List  Diagnosis  . OVERWEIGHT  . DEPRESSION/ANXIETY  .  CONSTIPATION  . DEGENERATIVE JOINT DISEASE, KNEES, BILATERAL  . SHOULDER PAIN, LEFT  . PATELLO-FEMORAL SYNDROME  . Lumbago  . FOOT PAIN, LEFT  . Medial meniscus, posterior horn derangement  . Stiffness of knee joint  . S/P arthroscopy of left knee  . Lateral meniscus tear  . Osteoarthritis of left knee  . Arthritis of knee, degenerative  . S/P total knee replacement  . Stiffness of joint, not elsewhere classified, lower leg  . Weakness of left leg  . Difficulty walking   Seth Bake, PTA  06/04/2012, 4:29 PM

## 2012-06-05 ENCOUNTER — Ambulatory Visit (INDEPENDENT_AMBULATORY_CARE_PROVIDER_SITE_OTHER): Payer: BC Managed Care – PPO | Admitting: Orthopedic Surgery

## 2012-06-05 VITALS — BP 126/88 | Ht 66.0 in | Wt 249.0 lb

## 2012-06-05 DIAGNOSIS — Z96652 Presence of left artificial knee joint: Secondary | ICD-10-CM

## 2012-06-05 DIAGNOSIS — M25669 Stiffness of unspecified knee, not elsewhere classified: Secondary | ICD-10-CM

## 2012-06-05 DIAGNOSIS — Z96659 Presence of unspecified artificial knee joint: Secondary | ICD-10-CM

## 2012-06-05 NOTE — Progress Notes (Signed)
Patient ID: Erica Graves, female   DOB: 1965/05/12, 47 y.o.   MRN: [id number] Chief Complaint  Patient presents with  . Follow-up    Post op 1 left manipulation DOS 05/26/12    The patient's manipulation produced 120 of flexion however he left the hospital 110 and now is back down to 90  I've asked her to call her insurance company to get an appeals process started I don't think she'll do well and will be a repeat manipulation without it.

## 2012-06-06 ENCOUNTER — Telehealth: Payer: Self-pay | Admitting: Orthopedic Surgery

## 2012-06-06 ENCOUNTER — Ambulatory Visit (HOSPITAL_COMMUNITY)
Admission: RE | Admit: 2012-06-06 | Discharge: 2012-06-06 | Disposition: A | Discharge status: Home / Self Care | Payer: BC Managed Care – PPO | Source: Ambulatory Visit | Attending: Family Medicine | Admitting: Family Medicine

## 2012-06-06 DIAGNOSIS — R262 Difficulty in walking, not elsewhere classified: Secondary | ICD-10-CM

## 2012-06-06 DIAGNOSIS — R29898 Other symptoms and signs involving the musculoskeletal system: Secondary | ICD-10-CM

## 2012-06-06 DIAGNOSIS — M25669 Stiffness of unspecified knee, not elsewhere classified: Secondary | ICD-10-CM

## 2012-06-06 NOTE — Telephone Encounter (Addendum)
Patient states, regarding CPM machine, that she may pursue the "non-covered" status with her insurance, BC[employer], and may proceed with self-pay basis.    I have called [facility] and left message, in event patient needs to go through their process of order, as she had done previously. If they are unable to order, our office has another company that can order the machine, [employer], ph [phone], and we would need to fax an order with patient's demographic information, and in addition, patient would need to sign the appropriate advanced notification form (to assume responsibility of payment if insurance denies again.)  Call to patient to review.  Left voice mail message.

## 2012-06-06 NOTE — Progress Notes (Signed)
Physical Therapy Treatment Patient Details  Name: Erica Graves MRN: [id number] Date of Birth: 1965-08-18  Today's Date: 06/06/2012 Time: 6644-0347 PT Time Calculation (min): 54 min  Visit#: 11 of 18  Re-eval: 06/29/12  charge:  There ex x 40' IP x 10 Authorization Visit#: 11 of 18   Subjective: Symptoms/Limitations Symptoms: Pt states that her knee is gettin stiff again. Pain Assessment Currently in Pain?: No/denies    Exercise/Treatments    Stretches Quad Stretch: 3 reps;30 seconds;Limitations Lobbyist Limitations: Chair stretch Knee: Self-Stretch to increase Flexion:  (child pose x 3 x 30") Aerobic Stationary Bike: Rec bike seat 9 x  5' Supine Heel Slides: Strengthening;10 reps Knee Extension: PROM after joint mobilizations Knee Flexion: PROM (pt actively pulls back, theapist holds foot so it does not slide pt does 5 bridges then attempts to pull leg back more...repeat 5 x Prone  Hamstring Curl: 10 reps Contract/Relax to Increase Flexion:  (5) Other Prone Exercises: terminal extension/ terminal flexion x 5 @  Other Prone Exercises: PROM   Manual Therapy Manual Therapy: Joint mobilization Joint Mobilization: Grade I-III to tib/fibula A/P to improve ROM  Cryotherapy Number Minutes Cryotherapy: 10 Minutes Cryotherapy Location: Knee  Physical Therapy Assessment and Plan PT Assessment and Plan Clinical Impression Statement: Pt PROM to 100 today; pt states she feels she got more from today's session, bike only 5 min to warm up than worked on stretching the entire time except for measuring for JAS brace.   Pt will benefit from skilled therapeutic intervention in order to improve on the following deficits: Decreased activity tolerance;Decreased range of motion;Decreased strength;Difficulty walking;Increased edema;Pain PT Plan: Limit bike and biodex work one on one w/ pt.    Goals  progressing  Problem List Patient Active Problem List  Diagnosis  . OVERWEIGHT   . DEPRESSION/ANXIETY  . CONSTIPATION  . DEGENERATIVE JOINT DISEASE, KNEES, BILATERAL  . SHOULDER PAIN, LEFT  . PATELLO-FEMORAL SYNDROME  . Lumbago  . FOOT PAIN, LEFT  . Medial meniscus, posterior horn derangement  . Stiffness of knee joint  . S/P arthroscopy of left knee  . Lateral meniscus tear  . Osteoarthritis of left knee  . Arthritis of knee, degenerative  . S/P total knee replacement  . Stiffness of joint, not elsewhere classified, lower leg  . Weakness of left leg  . Difficulty walking    PT - End of Session Equipment Utilized During Treatment: Gait belt Activity Tolerance: Patient tolerated treatment well General Behavior During Session: [facility] for tasks performed  GP    RUSSELL,CINDY 06/06/2012, 4:18 PM

## 2012-06-09 ENCOUNTER — Ambulatory Visit (HOSPITAL_COMMUNITY)
Admission: RE | Admit: 2012-06-09 | Discharge: 2012-06-09 | Disposition: A | Discharge status: Home / Self Care | Payer: BC Managed Care – PPO | Source: Ambulatory Visit | Attending: Orthopedic Surgery | Admitting: Orthopedic Surgery

## 2012-06-09 NOTE — Telephone Encounter (Signed)
Patient returned call today, Monday, 06/09/12, to relay that she understands about process for CPM, but that is only in event of another surgery.  At the time of patient's appointment Friday with [facility] physical therapy, a new brace is being ordered, per Dr. Mort Sawyers orders, which, patient states, will tighten and adjust as needed.  She is awaiting call back from therapy to let her know when it comes in.  Patient's next appointment here is 06/23/12.

## 2012-06-09 NOTE — Progress Notes (Signed)
Physical Therapy Treatment Patient Details  Name: Erica Graves MRN: [id number] Date of Birth: March 06, 1965  Today's Date: 06/09/2012 Time: 4098-1191 PT Time Calculation (min): 45 min  Visit#: 12 of 18  Re-eval: 06/29/12 Charges: Therex x 18' Manual x 23'  Authorization: [employer]BS  Authorization Visit#: 12 of 18   Subjective: Symptoms/Limitations Symptoms: Pt reports continued stretching at home. Pain Assessment Currently in Pain?: No/denies   Exercise/Treatments Stretches Quad Stretch: 2 reps;60 seconds Quad Stretch Limitations: Chair stretch Aerobic Stationary Bike: Rec bike seat 9 x  5' Supine Heel Slides: 10 reps Knee Extension: PROM Knee Flexion: PROM Prone  Contract/Relax to Increase Flexion: x5 Other Prone Exercises: terminal extension/ terminal flexion x 10   Manual Therapy Manual Therapy: Myofascial release Joint Mobilization: Grade I-III to tib/fibula A/P to improve ROM  Myofascial Release: to quad to decrease tightness and adhesions  Physical Therapy Assessment and Plan PT Assessment and Plan Clinical Impression Statement: Tx focus on increasing ROM. Pt achieved 100 degrees active flexion this session. MFR completed to L quad and incision. Pt plans to ice at home.  PT Plan: Continue to progress ROM per PT POC.     Problem List Patient Active Problem List  Diagnosis  . OVERWEIGHT  . DEPRESSION/ANXIETY  . CONSTIPATION  . DEGENERATIVE JOINT DISEASE, KNEES, BILATERAL  . SHOULDER PAIN, LEFT  . PATELLO-FEMORAL SYNDROME  . Lumbago  . FOOT PAIN, LEFT  . Medial meniscus, posterior horn derangement  . Stiffness of knee joint  . S/P arthroscopy of left knee  . Lateral meniscus tear  . Osteoarthritis of left knee  . Arthritis of knee, degenerative  . S/P total knee replacement  . Stiffness of joint, not elsewhere classified, lower leg  . Weakness of left leg  . Difficulty walking    PT - End of Session Equipment Utilized During Treatment: Gait  belt Activity Tolerance: Patient tolerated treatment well General Behavior During Session: [facility] for tasks performed  Seth Bake, PTA  06/09/2012, 3:30 PM

## 2012-06-11 ENCOUNTER — Ambulatory Visit (HOSPITAL_COMMUNITY)
Admission: RE | Admit: 2012-06-11 | Discharge: 2012-06-11 | Disposition: A | Discharge status: Home / Self Care | Payer: BC Managed Care – PPO | Source: Ambulatory Visit | Attending: Family Medicine | Admitting: Family Medicine

## 2012-06-11 ENCOUNTER — Telehealth: Payer: Self-pay | Admitting: Orthopedic Surgery

## 2012-06-11 NOTE — Progress Notes (Signed)
Physical Therapy Treatment Patient Details  Name: Erica Graves MRN: [id number] Date of Birth: [birth year]  Today's Date: 06/11/2012 Time: 1600-1650 PT Time Calculation (min): 50 min  Visit#: 13 of 18  Re-eval: 06/29/12 Charges: Therex x 15' Manual x 20' Ice x 10'  Authorization: [employer]  Authorization Visit#: 13 of 18   Subjective: Symptoms/Limitations Symptoms: Pt states that she slipped at work yesterday. She caught herself but her hamstring is sore. Pain Assessment Currently in Pain?: Yes Pain Score: 0-No pain Pain Location: Knee   Exercise/Treatments Stretches Quad Stretch: 2 reps;60 seconds Quad Stretch Limitations: Chair stretch Aerobic Stationary Bike: Rec bike seat 9 x  5' Supine Bridges: 5 sets (with PROM) Knee Extension: PROM Knee Flexion: PROM Prone  Contract/Relax to Increase Flexion: x5 Other Prone Exercises: terminal extension/ terminal flexion x 10   Modalities Modalities: Cryotherapy Manual Therapy Joint Mobilization: Grade I-III to tib/fibula A/P to improve ROM  Myofascial Release: to quad to decrease tightness and adhesions Cryotherapy Number Minutes Cryotherapy: 10 Minutes Cryotherapy Location: Knee (Left) Type of Cryotherapy: Ice pack  Physical Therapy Assessment and Plan PT Assessment and Plan Clinical Impression Statement: Pt presents with increased hamstring tightness secondary to slipping at work yesterday. Pt presents with improved flexion this session. Quad tightness and spasms have decreased. Ice applied at end of session to limited pain and inflammation. PT Plan: Continue to progress ROM per PT POC.     Problem List Patient Active Problem List  Diagnosis  . OVERWEIGHT  . DEPRESSION/ANXIETY  . CONSTIPATION  . DEGENERATIVE JOINT DISEASE, KNEES, BILATERAL  . SHOULDER PAIN, LEFT  . PATELLO-FEMORAL SYNDROME  . Lumbago  . FOOT PAIN, LEFT  . Medial meniscus, posterior horn derangement  . Stiffness of knee joint  . S/P arthroscopy  of left knee  . Lateral meniscus tear  . Osteoarthritis of left knee  . Arthritis of knee, degenerative  . S/P total knee replacement  . Stiffness of joint, not elsewhere classified, lower leg  . Weakness of left leg  . Difficulty walking    PT - End of Session Equipment Utilized During Treatment: Gait belt Activity Tolerance: Patient tolerated treatment well General Behavior During Therapy: [facility] for tasks assessed/performed Cognition: WFL for tasks performed  Seth Bake, PTA  06/11/2012, 5:12 PM

## 2012-06-11 NOTE — Telephone Encounter (Signed)
Patient called today to relay that her physical therapy is going well, much better; asking if she can go to therapy every day.  She is aware Dr. Romeo Apple is out of office until Monday afternoon 06/16/12; said will mention it to physical therapist at her appointment on Friday, 06/13/12, which is also when the "JAS" brace may be in, as previously noted.  I relayed to patient that her insurance may need to be checked by the therapy department to inquire if there is a set number of sessions that they will cover.  She will pursue with therapy personnel; mainly wanted to relay to Dr. Romeo Apple that she is improving and feeling better.  Her next scheduled office appointment is 06/23/12.  Her ph# [phone].

## 2012-06-13 ENCOUNTER — Ambulatory Visit (HOSPITAL_COMMUNITY)
Admission: RE | Admit: 2012-06-13 | Discharge: 2012-06-13 | Disposition: A | Discharge status: Home / Self Care | Payer: BC Managed Care – PPO | Source: Ambulatory Visit | Attending: Orthopedic Surgery | Admitting: Orthopedic Surgery

## 2012-06-13 DIAGNOSIS — M25669 Stiffness of unspecified knee, not elsewhere classified: Secondary | ICD-10-CM

## 2012-06-13 DIAGNOSIS — R29898 Other symptoms and signs involving the musculoskeletal system: Secondary | ICD-10-CM

## 2012-06-13 DIAGNOSIS — R262 Difficulty in walking, not elsewhere classified: Secondary | ICD-10-CM

## 2012-06-13 NOTE — Progress Notes (Signed)
Physical Therapy Treatment Patient Details  Name: Erica Graves MRN: [id number] Date of Birth: [birth year]  Today's Date: 06/13/2012 Time: 7829-5621 PT Time Calculation (min): 65 min Charge: Therex 30, Manual 20', Estim x 1, Ice x 1   Visit#: 14 of 18  Re-eval: 06/29/12 Assessment Diagnosis: manipualtion Surgical Date: 05/26/12 Next MD Visit: Romeo Apple 06/23/2012 Prior Therapy: HH, Out patient  Authorization: [employer]BS  Authorization Time Period:    Authorization Visit#: 14 of 18   Subjective: Symptoms/Limitations Symptoms: Pt reported increased pain and stiffness today, pain scale 7/10. Pain Assessment Currently in Pain?: Yes Pain Score:   7 Pain Location: Knee Pain Orientation: Left  Objective:   Exercise/Treatments Stretches Quad Stretch: 3 reps;30 seconds;Limitations Quad Stretch Limitations: prone Knee: Self-Stretch to increase Flexion: 3 reps;30 seconds;Limitations Knee: Self-Stretch Limitations: chair stretch Gastroc Stretch: 3 reps;30 seconds;Limitations Gastroc Stretch Limitations: Slant board 3x 30" Aerobic Stationary Bike: Rec bike seat 10 x 8 min for ROM Supine Quad Sets: 10 reps Knee Extension: PROM Knee Flexion: PROM Prone  Contract/Relax to Increase Flexion: x5 Other Prone Exercises: TKE 10x 10"   Modalities Modalities: Cryotherapy;Electrical Stimulation Manual Therapy Manual Therapy: Edema management Edema Management: Retro massage with LE elevated  Joint Mobilization: Patella mobs all directions, Grade I-III to tib/fibula A/P to improve ROM and reduce pain Cryotherapy Number Minutes Cryotherapy: 15 Minutes Cryotherapy Location: Knee Type of Cryotherapy: Ice pack Pharmacologist Location: L knee Statistician Action: Hi volt with LE elevated and cice Electrical Stimulation Parameters: Hi volt 140 Volts Electrical Stimulation Goals: Edema;Pain  Physical Therapy Assessment and Plan PT Assessment and  Plan Clinical Impression Statement: Pt with increased pitting edema initially this session limiting ROM and increased pain. Session focus on manual techniques to reducae adhesions patella and lateral knee, joint mobs to improve mobility/increase ROM and retro massage with hi volt estim following to reduce edema and pain. PT Plan: Continue to progress ROM per PT POC.    Goals    Problem List Patient Active Problem List  Diagnosis  . OVERWEIGHT  . DEPRESSION/ANXIETY  . CONSTIPATION  . DEGENERATIVE JOINT DISEASE, KNEES, BILATERAL  . SHOULDER PAIN, LEFT  . PATELLO-FEMORAL SYNDROME  . Lumbago  . FOOT PAIN, LEFT  . Medial meniscus, posterior horn derangement  . Stiffness of knee joint  . S/P arthroscopy of left knee  . Lateral meniscus tear  . Osteoarthritis of left knee  . Arthritis of knee, degenerative  . S/P total knee replacement  . Stiffness of joint, not elsewhere classified, lower leg  . Weakness of left leg  . Difficulty walking    PT - End of Session Activity Tolerance: Patient tolerated treatment well General Behavior During Therapy: WFL for tasks assessed/performed Cognition: WFL for tasks performed  GP    Juel Burrow 06/13/2012, 4:28 PM

## 2012-06-16 ENCOUNTER — Ambulatory Visit (HOSPITAL_COMMUNITY)
Admission: RE | Admit: 2012-06-16 | Discharge: 2012-06-16 | Disposition: A | Discharge status: Home / Self Care | Payer: BC Managed Care – PPO | Source: Ambulatory Visit | Attending: Orthopedic Surgery | Admitting: Orthopedic Surgery

## 2012-06-16 ENCOUNTER — Encounter (HOSPITAL_COMMUNITY): Payer: Self-pay | Admitting: *Deleted

## 2012-06-16 NOTE — Telephone Encounter (Signed)
Ok Seems like dependent edema

## 2012-06-16 NOTE — Progress Notes (Signed)
Physical Therapy Treatment Patient Details  Name: Erica Graves MRN: [id number] Date of Birth: May 30, 1965  Today's Date: 06/16/2012 Time: 4098-1191 PT Time Calculation (min): 38 min  Visit#: 15 of 18  Re-eval: 06/29/12 Charges: Therex x 28' Manual x 8'  Authorization: [employer]  Authorization Visit#: 15 of 18   Subjective: Symptoms/Limitations Symptoms: Pt is concerned about increased swelling in her LLE. Pain Assessment Currently in Pain?: Yes Pain Score:   6 Pain Location: Knee Pain Orientation: Left   Exercise/Treatments Stretches Quad Stretch: 3 reps;30 seconds;Limitations Quad Stretch Limitations: prone Knee: Self-Stretch to increase Flexion: 3 reps;30 seconds;Limitations Knee: Self-Stretch Limitations: chair stretch Aerobic Stationary Bike: Rec bike seat 10 x 8 min for ROM Supine Knee Extension: PROM Knee Flexion: PROM Prone  Contract/Relax to Increase Flexion: x5 Other Prone Exercises: TKF 10x5"   Manual Therapy Edema Management: Retro massage with LE elevated Joint Mobilization: Grade I-III to tib/fibula A/P to improve ROM and reduce pain  Physical Therapy Assessment and Plan PT Assessment and Plan Clinical Impression Statement: Tx focus on increasing ROM and decreasing swelling. Retro massage completed to LLE to decrease swelling. Tx time limited secondary to JAS fitting. PT Plan: Continue to progress ROM per PT POC.     Problem List Patient Active Problem List  Diagnosis  . OVERWEIGHT  . DEPRESSION/ANXIETY  . CONSTIPATION  . DEGENERATIVE JOINT DISEASE, KNEES, BILATERAL  . SHOULDER PAIN, LEFT  . PATELLO-FEMORAL SYNDROME  . Lumbago  . FOOT PAIN, LEFT  . Medial meniscus, posterior horn derangement  . Stiffness of knee joint  . S/P arthroscopy of left knee  . Lateral meniscus tear  . Osteoarthritis of left knee  . Arthritis of knee, degenerative  . S/P total knee replacement  . Stiffness of joint, not elsewhere classified, lower leg  . Weakness  of left leg  . Difficulty walking    PT - End of Session Activity Tolerance: Patient tolerated treatment well General Behavior During Therapy: [facility] for tasks assessed/performed Cognition: WFL for tasks performed  Seth Bake, PTA  06/16/2012, 5:03 PM

## 2012-06-16 NOTE — Telephone Encounter (Signed)
Patient called back today, 06/16/12, to relay that her knee has swelling in it, as of Friday, 06/13/12, which she said seems to happen when she is "up on it."  She kept her therapy appointment for that day, and has been icing and elevating it higher than her heart as has been advised.  Said wanted Dr. Romeo Apple to know.  She has therapy appointment today, and is to get the "JAS" brace today.  Her appointment here is 06/23/12.

## 2012-06-17 ENCOUNTER — Ambulatory Visit (HOSPITAL_COMMUNITY): Payer: BC Managed Care – PPO | Admitting: *Deleted

## 2012-06-18 ENCOUNTER — Ambulatory Visit (HOSPITAL_COMMUNITY)
Admission: RE | Admit: 2012-06-18 | Discharge: 2012-06-18 | Disposition: A | Discharge status: Home / Self Care | Payer: BC Managed Care – PPO | Source: Ambulatory Visit | Attending: Family Medicine | Admitting: Family Medicine

## 2012-06-18 DIAGNOSIS — M25669 Stiffness of unspecified knee, not elsewhere classified: Secondary | ICD-10-CM

## 2012-06-18 DIAGNOSIS — R29898 Other symptoms and signs involving the musculoskeletal system: Secondary | ICD-10-CM

## 2012-06-18 DIAGNOSIS — R262 Difficulty in walking, not elsewhere classified: Secondary | ICD-10-CM

## 2012-06-18 NOTE — Progress Notes (Signed)
Physical Therapy Treatment Patient Details  Name: Erica Graves MRN: [id number] Date of Birth: 1965-11-09  Today's Date: 06/18/2012 Time: 7829-5621 PT Time Calculation (min): 47 min  Visit#: 16 of 18  Re-eval: 06/29/12 Charges: Therex 35' Ice x 10'  Authorization: [employer]   Authorization Visit#: 16 of 18   Subjective: Pain Assessment Pain Location: Knee   Exercise/Treatments Stretches Quad Stretch: 3 reps;30 seconds;Limitations Lobbyist Limitations: prone Knee: Self-Stretch to increase Flexion: 3 reps;30 seconds;Limitations Knee: Self-Stretch Limitations: chair stretch Aerobic Stationary Bike: Rec bike seat 9 x 8 min for ROM Supine Knee Extension: PROM Knee Flexion: PROM Prone  Contract/Relax to Increase Flexion: x5 Other Prone Exercises: TKE 10 x 5"  Ice pack to left knee with LEs elevated in supine x 10'  Physical Therapy Assessment and Plan PT Assessment and Plan Clinical Impression Statement: Tx focus continues to be on increasing ROM. Pt states that she is using JAS brace every day. L knee AROM is 4-100 today. Swelling has decreased. Ice applied at end of session to limit pain and inflammation. PT Plan: Complete progress note prior to MD appointment next session.     Problem List Patient Active Problem List  Diagnosis  . OVERWEIGHT  . DEPRESSION/ANXIETY  . CONSTIPATION  . DEGENERATIVE JOINT DISEASE, KNEES, BILATERAL  . SHOULDER PAIN, LEFT  . PATELLO-FEMORAL SYNDROME  . Lumbago  . FOOT PAIN, LEFT  . Medial meniscus, posterior horn derangement  . Stiffness of knee joint  . S/P arthroscopy of left knee  . Lateral meniscus tear  . Osteoarthritis of left knee  . Arthritis of knee, degenerative  . S/P total knee replacement  . Stiffness of joint, not elsewhere classified, lower leg  . Weakness of left leg  . Difficulty walking    PT - End of Session Activity Tolerance: Patient tolerated treatment well General Behavior During Therapy: [facility] for tasks  assessed/performed Cognition: WFL for tasks performed  Seth Bake, PTA  06/18/2012, 3:39 PM

## 2012-06-20 ENCOUNTER — Ambulatory Visit (HOSPITAL_COMMUNITY)
Admission: RE | Admit: 2012-06-20 | Discharge: 2012-06-20 | Disposition: A | Discharge status: Home / Self Care | Payer: BC Managed Care – PPO | Source: Ambulatory Visit

## 2012-06-20 DIAGNOSIS — M25669 Stiffness of unspecified knee, not elsewhere classified: Secondary | ICD-10-CM

## 2012-06-20 DIAGNOSIS — R29898 Other symptoms and signs involving the musculoskeletal system: Secondary | ICD-10-CM

## 2012-06-20 DIAGNOSIS — R262 Difficulty in walking, not elsewhere classified: Secondary | ICD-10-CM

## 2012-06-20 NOTE — Progress Notes (Signed)
Physical Therapy Treatment Patient Details  Name: Erica Graves MRN: [id number] Date of Birth: [birth year]  Today's Date: 06/20/2012 Time: 9562-1308 PT Time Calculation (min): 60 min Charge Therex 30', Manual 15', Estim x 1, Ice x 1  Visit#: 17 of 18  Re-eval: 06/29/12 Assessment Diagnosis: manipualtion Surgical Date: 05/26/12 Next MD Visit: Romeo Apple 06/23/2012 Prior Therapy: HH, Out patient  Authorization: BCBS  Authorization Time Period:    Authorization Visit#: 17 of 18   Subjective: Symptoms/Limitations Symptoms: Pt reported increased pain and swelling to Lt knee today. Pain Assessment Currently in Pain?: Yes Pain Score:   7 Pain Location: Knee Pain Orientation: Left  Objective:   Exercise/Treatments Stretches Quad Stretch: 3 reps;30 seconds;Limitations Quad Stretch Limitations: prone Knee: Self-Stretch to increase Flexion: 3 reps;60 seconds Gastroc Stretch: 3 reps;30 seconds;Limitations Gastroc Stretch Limitations: Slant board 3x 30" Aerobic Stationary Bike: Rec bike seat 9 x 8 min for ROM Supine Knee Extension: PROM;1 set Knee Flexion: PROM;1 set Prone  Contract/Relax to Increase Flexion: x5 Other Prone Exercises: TKF 10x5"   Modalities Modalities: Cryotherapy;Electrical Stimulation Manual Therapy Manual Therapy: Edema management Edema Management: Retro massage with LE elevated  Joint Mobilization: Grade I-III to tib/fibula A/P to improve ROM and reduce pain Cryotherapy Number Minutes Cryotherapy: 15 Minutes Cryotherapy Location: Knee Type of Cryotherapy: Ice pack Pharmacologist Location: L knee Electrical Stimulation Action: Hi volt with LE elevated and ice Electrical Stimulation Parameters: Hi volt 200 V Electrical Stimulation Goals: Edema;Pain  Physical Therapy Assessment and Plan PT Assessment and Plan Clinical Impression Statement: Session focus on improving ROM, manual techniques and modalities to reduce  edema and pain.  Significant reduction in edema at end of session with pain reduced and improved gait mechanics. PT Plan: Re-eval next session prior MD apt.    Goals    Problem List Patient Active Problem List  Diagnosis  . OVERWEIGHT  . DEPRESSION/ANXIETY  . CONSTIPATION  . DEGENERATIVE JOINT DISEASE, KNEES, BILATERAL  . SHOULDER PAIN, LEFT  . PATELLO-FEMORAL SYNDROME  . Lumbago  . FOOT PAIN, LEFT  . Medial meniscus, posterior horn derangement  . Stiffness of knee joint  . S/P arthroscopy of left knee  . Lateral meniscus tear  . Osteoarthritis of left knee  . Arthritis of knee, degenerative  . S/P total knee replacement  . Stiffness of joint, not elsewhere classified, lower leg  . Weakness of left leg  . Difficulty walking    PT - End of Session Activity Tolerance: Patient tolerated treatment well General Behavior During Therapy: WFL for tasks assessed/performed Cognition: WFL for tasks performed  GP    Juel Burrow 06/20/2012, 5:41 PM

## 2012-06-23 ENCOUNTER — Ambulatory Visit (INDEPENDENT_AMBULATORY_CARE_PROVIDER_SITE_OTHER): Payer: BC Managed Care – PPO | Admitting: Orthopedic Surgery

## 2012-06-23 ENCOUNTER — Ambulatory Visit (HOSPITAL_COMMUNITY)
Admission: RE | Admit: 2012-06-23 | Discharge: 2012-06-23 | Disposition: A | Discharge status: Home / Self Care | Payer: BC Managed Care – PPO | Source: Ambulatory Visit | Attending: Orthopedic Surgery | Admitting: Orthopedic Surgery

## 2012-06-23 ENCOUNTER — Encounter: Payer: Self-pay | Admitting: Orthopedic Surgery

## 2012-06-23 VITALS — BP 126/92 | Ht 66.0 in | Wt 249.0 lb

## 2012-06-23 DIAGNOSIS — Z96659 Presence of unspecified artificial knee joint: Secondary | ICD-10-CM

## 2012-06-23 DIAGNOSIS — M25669 Stiffness of unspecified knee, not elsewhere classified: Secondary | ICD-10-CM

## 2012-06-23 DIAGNOSIS — R29898 Other symptoms and signs involving the musculoskeletal system: Secondary | ICD-10-CM

## 2012-06-23 DIAGNOSIS — Z96652 Presence of left artificial knee joint: Secondary | ICD-10-CM

## 2012-06-23 DIAGNOSIS — R262 Difficulty in walking, not elsewhere classified: Secondary | ICD-10-CM

## 2012-06-23 MED ORDER — OXYCODONE HCL ER 10 MG PO T12A: 10.0000 mg | EXTENDED_RELEASE_TABLET | Freq: Two times a day (BID) | ORAL | Status: DC

## 2012-06-23 MED ORDER — OXYCODONE-ACETAMINOPHEN 5-325 MG PO TABS: 1.0000 | ORAL_TABLET | ORAL | Status: DC | PRN

## 2012-06-23 MED ORDER — DIAZEPAM 10 MG PO TABS: 10.0000 mg | ORAL_TABLET | Freq: Four times a day (QID) | ORAL | Status: DC

## 2012-06-23 NOTE — Patient Instructions (Signed)
WEAR COMPRESSION STOCKINGS TO REDUCE SWELLING, ICE AND ELEVATE AFTER WORK

## 2012-06-23 NOTE — Progress Notes (Signed)
Physical Therapy Re-evaluation/Treatment  Patient Details  Name: STORMEE DUDA MRN: [id number] Date of Birth: 04-05-1965  Today's Date: 06/23/2012 Time: 6213-0865 PT Time Calculation (min): 75 min Charge: MMT x 1, ROM Measurement x 1, Manual 10, Estim x 1, Ice x 1, therex 23'              Visit#: 18 of 18  Re-eval: 06/29/12 Assessment Diagnosis: manipualtion Surgical Date: 05/26/12 Next MD Visit: Romeo Apple 06/23/2012 Prior Therapy: HH, Out patient  Authorization: BCBS    Authorization Time Period:    Authorization Visit#: 18 of 18   Subjective Symptoms/Limitations Symptoms: Increased swelling total Lt LE, pt reported mainly tightness/stiffness  Pt compliant with JAS brace 3x a day. How long can you sit comfortably?: Able to sit without difficulty How long can you stand comfortably?: Able to stand comfortably for 30 minutes, able to cook and clean without difficulty. How long can you walk comfortably?: Pt is able to walk for about 45 minutes with more pain over time Pain Assessment Currently in Pain?: Yes Pain Score:   4 Pain Location: Knee Pain Orientation: Left  Objective:   Assessment LLE AROM (degrees) Left Knee Extension: 7 (was 7) Left Knee Flexion: 105 (was 108) LLE PROM (degrees) Left Hip Extension: 5 (was 5) Left Hip Flexion: 110 (was 111) LLE Strength Left Knee Flexion:  (4+/5 was 4/5) Left Knee Extension: 4/5 (was 3+/5)  Exercise/Treatments Stretches Active Hamstring Stretch: 3 reps;30 seconds Quad Stretch: 3 reps;30 seconds;Limitations Quad Stretch Limitations: prone Knee: Self-Stretch to increase Flexion: 3 reps;60 seconds Knee: Self-Stretch Limitations: chair stretch Gastroc Stretch: 3 reps;30 seconds;Limitations Gastroc Stretch Limitations: Slant board 3x 30" Aerobic Stationary Bike: Rec bike seat 10 x 8 min for ROM Standing Stairs: 2RT reciprocal pattern with 1 HR Supine Quad Sets: 5 reps Heel Slides: 10 reps Knee Extension: PROM;1  set Knee Flexion: PROM;1 set   Modalities Modalities: Cryotherapy;Electrical Stimulation Manual Therapy Manual Therapy: Edema management Edema Management: Retro massage with LE elevated x 10' Cryotherapy Number Minutes Cryotherapy: 15 Minutes Cryotherapy Location: Knee Type of Cryotherapy: Ice pack Pharmacologist Location: L knee Electrical Stimulation Action: Hi volt with LE elevated and ice Electrical Stimulation Parameters: Hi Volt 200 V Electrical Stimulation Goals: Edema;Pain  Physical Therapy Assessment and Plan PT Assessment and Plan Clinical Impression Statement: Reassessment complete prior MD apt later today.  Mrs Evers has had 18 OPPT sessions over 3 1/2 weeks with the following findings:  Pt reported independent with HEP twice daily.  Improved quad and hamstring strength with ability to ascend and descend stairs reciprically but does continue to demonstrate weak quad control both up and down stairs requiriing handrail assistance to reduce risk of injury.  AROM has improved but is limited by increased edema while standing/walking for increased length of time.  Pt reported ability to walk for 45 and stand for 30 minutes with pain increase over time.  Pt recommended thigh high 20-73mmHG compressions hose to assist with edema while at work and standing for long periods of time. PT Plan: F/u with MD to continue vs discharge to HEP.  Also follow up with compression hose to assist with edema control.    Goals Home Exercise Program Pt will Perform Home Exercise Program: Independently PT Goal: Perform Home Exercise Program - Progress: Met (2x a day) PT Short Term Goals Time to Complete Short Term Goals: 2 weeks PT Short Term Goal 1: Increased ROM to 5 to 115 to allow more normalized gt. 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 PT Long Term Goal 2 - Progress: Progressing toward goal Long Term Goal 3: pt to be able to squat and rise from a squatted positon in order to complete house/yard work Long Term Goal 3 Progress: Progressing toward goal Long Term Goal 4: Pt to be able to be up for three hours to complete normal life activities without pain  Long Term Goal 4 Progress: Not met PT Long Term Goal 5: Pt able to ambulate for an hour without difficutly Long Term Goal 5 Progress: Not met  Problem List Patient Active Problem List  Diagnosis  . OVERWEIGHT  . DEPRESSION/ANXIETY  . CONSTIPATION  . DEGENERATIVE JOINT DISEASE, KNEES, BILATERAL  . SHOULDER PAIN, LEFT  . PATELLO-FEMORAL SYNDROME  . Lumbago  . FOOT PAIN, LEFT  . Medial meniscus, posterior horn derangement  . Stiffness of knee joint  . S/P arthroscopy of left knee  . Lateral meniscus tear  . Osteoarthritis of left knee  . Arthritis of knee, degenerative  . S/P total knee replacement  . Stiffness of joint, not elsewhere classified, lower leg  . Weakness of left leg  . Difficulty walking    PT - End of Session Activity Tolerance: Patient tolerated treatment well General Behavior During Therapy: WFL for tasks assessed/performed Cognition: WFL for tasks performed  GP    Juel Burrow 06/23/2012, 3:43 PM

## 2012-06-23 NOTE — Progress Notes (Signed)
Patient ID: Erica Graves, female   DOB: 1965-10-03, 47 y.o.   MRN: [id number] Chief Complaint  Patient presents with  . Follow-up    3 week recheck on left knee manipulation on 05-26-12. Check ROM.   Left Knee Extension: 7 (was 7)  Left Knee Flexion: 105 (was 108)  The patient's leg has been swelling so we've ordered a TED hose advised her to start prone hangs at home continue knee flexion exercises even if she can't do formal physical therapy unmeasured in the office approximately 95 of knee flexion  Refill all pain medicines come back in a week check swelling

## 2012-06-25 ENCOUNTER — Telehealth (HOSPITAL_COMMUNITY): Payer: Self-pay

## 2012-06-25 ENCOUNTER — Ambulatory Visit (HOSPITAL_COMMUNITY): Payer: BC Managed Care – PPO | Admitting: Physical Therapy

## 2012-06-27 ENCOUNTER — Ambulatory Visit (HOSPITAL_COMMUNITY)
Admission: RE | Admit: 2012-06-27 | Discharge: 2012-06-27 | Disposition: A | Discharge status: Home / Self Care | Payer: BC Managed Care – PPO | Source: Ambulatory Visit | Attending: Orthopedic Surgery | Admitting: Orthopedic Surgery

## 2012-06-27 DIAGNOSIS — M6281 Muscle weakness (generalized): Secondary | ICD-10-CM | POA: Insufficient documentation

## 2012-06-27 DIAGNOSIS — R29898 Other symptoms and signs involving the musculoskeletal system: Secondary | ICD-10-CM

## 2012-06-27 DIAGNOSIS — R262 Difficulty in walking, not elsewhere classified: Secondary | ICD-10-CM | POA: Insufficient documentation

## 2012-06-27 DIAGNOSIS — M25569 Pain in unspecified knee: Secondary | ICD-10-CM | POA: Insufficient documentation

## 2012-06-27 DIAGNOSIS — M25669 Stiffness of unspecified knee, not elsewhere classified: Secondary | ICD-10-CM

## 2012-06-27 DIAGNOSIS — IMO0001 Reserved for inherently not codable concepts without codable children: Secondary | ICD-10-CM | POA: Insufficient documentation

## 2012-06-27 NOTE — Progress Notes (Signed)
Physical Therapy Treatment Patient Details  Name: Erica Graves MRN: [id number] Date of Birth: 02-27-1966  Today's Date: 06/27/2012 Time: 1433-1530 PT Time Calculation (min): 57 min  Visit#: 19 of 30  Re-eval: 06/29/12  charge: there ex x 43; IP  Authorization: [employer]BS   Subjective: Symptoms/Limitations Symptoms: Pt has compression stockings states it has been improving her swelling. States when she walks it seems like she doesn't wand to straighten out. Pain Assessment Pain Score:   5 Pain Location: Knee Pain Orientation: Left Pain Type: Surgical pain    Exercise/Treatments  Stretches Passive Hamstring Stretch: 2 reps;60 seconds;Limitations Passive Hamstring Stretch Limitations: long sitting Quad Stretch: 3 reps;30 seconds;Limitations Quad Stretch Limitations: prone Knee: Self-Stretch to increase Flexion: 3 reps;60 seconds Knee: Self-Stretch Limitations: chair stretch Gastroc Stretch: 3 reps;30 seconds;Limitations Gastroc Stretch Limitations: Slant board 3x 30" Aerobic Stationary Bike: Rec bike seat 10 x 8 min for ROM Standing Terminal Knee Extension: 15 reps Other Standing Knee Exercises: retro walking x 2 RT   Supine Quad Sets: 10 reps Heel Slides: 10 reps Terminal Knee Extension: 10 reps Knee Extension: PROM;1 set Knee Flexion: PROM;1 set   Prone  Hamstring Curl: 10 reps Contract/Relax to Increase Flexion: x5 Other Prone Exercises: TKF, TKE, x 10    Cryotherapy Number Minutes Cryotherapy: 10 Minutes Cryotherapy Location: Knee Type of Cryotherapy: Ice pack  Physical Therapy Assessment and Plan PT Assessment and Plan Clinical Impression Statement: Todays treatment concentrated on increasing ROM.  Pt ROM at last session 7-105; this session -3 to 108.  Pt continues to use JAS for 30 minutes three times a week. PT Frequency: Min 3X/week PT Duration: 4 weeks PT Plan: Pt to continue therapy. Treatment to stress ROM     Goals  progressing  Problem  List Patient Active Problem List   Diagnosis Date Noted  . Stiffness of joint, not elsewhere classified, lower leg 04/22/2012  . Weakness of left leg 04/22/2012  . Difficulty walking 04/22/2012  . S/P total knee replacement 04/03/2012  . Arthritis of knee, degenerative 03/05/2012  . Osteoarthritis of left knee 12/12/2011  . S/P arthroscopy of left knee 06/13/2011  . Lateral meniscus tear 06/13/2011  . Stiffness of knee joint 06/05/2011  . Medial meniscus, posterior horn derangement 05/16/2011  . DEGENERATIVE JOINT DISEASE, KNEES, BILATERAL 12/19/2009  . 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 FOOT PAIN, LEFT 07/22/2007  . DEPRESSION/ANXIETY 12/27/2006  . SHOULDER PAIN, LEFT 12/27/2006  . OVERWEIGHT 11/12/2006  . CONSTIPATION 11/12/2006  . Lumbago 11/12/2006    PT - End of Session Activity Tolerance: Patient tolerated treatment well  GP    RUSSELL,CINDY 06/27/2012, 4:18 PM

## 2012-06-30 ENCOUNTER — Ambulatory Visit (INDEPENDENT_AMBULATORY_CARE_PROVIDER_SITE_OTHER): Payer: BC Managed Care – PPO | Admitting: Orthopedic Surgery

## 2012-06-30 ENCOUNTER — Encounter: Payer: Self-pay | Admitting: Orthopedic Surgery

## 2012-06-30 VITALS — BP 130/80 | Ht 66.0 in | Wt 249.0 lb

## 2012-06-30 DIAGNOSIS — M25669 Stiffness of unspecified knee, not elsewhere classified: Secondary | ICD-10-CM

## 2012-06-30 DIAGNOSIS — M25662 Stiffness of left knee, not elsewhere classified: Secondary | ICD-10-CM

## 2012-06-30 MED ORDER — IBUPROFEN 800 MG PO TABS: 800.0000 mg | ORAL_TABLET | Freq: Three times a day (TID) | ORAL | Status: DC | PRN

## 2012-06-30 NOTE — Patient Instructions (Addendum)
Therapy at home   Start IBUPROFEN 800

## 2012-06-30 NOTE — Progress Notes (Signed)
Patient ID: Erica Graves, female   DOB: 1965/08/08, 47 y.o.   MRN: [id number] Chief Complaint  Patient presents with  . Follow-up    1 week recheck left knee manipulation 05/26/12    Status post knee replacement status post knee manipulation  The patient complains of aching pain  She's having trouble with extension now, flexion is improved to 105  I gave her a couple of exercises to do at home she would like to do all of her exercises at home and stop therapy right now. I agreed with a 2 week trial. In a recheck to make sure she's not losing ground  And ibuprofen for aching pain continue Percocet as needed followup in 2 weeks

## 2012-07-14 ENCOUNTER — Encounter: Payer: Self-pay | Admitting: Orthopedic Surgery

## 2012-07-14 ENCOUNTER — Ambulatory Visit (INDEPENDENT_AMBULATORY_CARE_PROVIDER_SITE_OTHER): Payer: BC Managed Care – PPO | Admitting: Orthopedic Surgery

## 2012-07-14 VITALS — BP 122/68 | Ht 66.0 in | Wt 249.0 lb

## 2012-07-14 DIAGNOSIS — Z96652 Presence of left artificial knee joint: Secondary | ICD-10-CM

## 2012-07-14 DIAGNOSIS — Z96659 Presence of unspecified artificial knee joint: Secondary | ICD-10-CM

## 2012-07-14 MED ORDER — OXYCODONE HCL ER 10 MG PO T12A: 10.0000 mg | EXTENDED_RELEASE_TABLET | Freq: Two times a day (BID) | ORAL | Status: DC

## 2012-07-14 NOTE — Progress Notes (Signed)
Patient ID: Erica Graves, female   DOB: 1965/04/09, 47 y.o.   MRN: [id number] Chief Complaint  Patient presents with  . Follow-up    2 week recheck left knee DOS 03/31/12    BP 122/68  Ht 5\' 6"  (1.676 m)  Wt 249 lb (112.946 kg)  BMI 40.21 kg/m2  Patient presents status post manipulation of the left total knee on 05/26/2012  She is doing well now wearing a long TED hose her husband is performing her therapy she has maintained 110 of flexion  She is moving better her pain is less we will continue and refill her current pain medication she'll see me in one month

## 2012-08-14 ENCOUNTER — Ambulatory Visit (INDEPENDENT_AMBULATORY_CARE_PROVIDER_SITE_OTHER): Payer: BC Managed Care – PPO | Admitting: Orthopedic Surgery

## 2012-08-14 ENCOUNTER — Encounter: Payer: Self-pay | Admitting: Orthopedic Surgery

## 2012-08-14 VITALS — BP 138/92 | Ht 66.0 in | Wt 249.0 lb

## 2012-08-14 DIAGNOSIS — G5602 Carpal tunnel syndrome, left upper limb: Secondary | ICD-10-CM

## 2012-08-14 DIAGNOSIS — G5600 Carpal tunnel syndrome, unspecified upper limb: Secondary | ICD-10-CM

## 2012-08-14 DIAGNOSIS — G5601 Carpal tunnel syndrome, right upper limb: Secondary | ICD-10-CM

## 2012-08-14 NOTE — Patient Instructions (Signed)
Take Vitamin B6 100 mg twice a day

## 2012-08-14 NOTE — Progress Notes (Signed)
Patient ID: Erica Graves, female   DOB: [birth year], 47 y.o.   MRN: [id number] Status post manipulation of the left knee after knee replacement surgery. The patient returned to work doing well now only on ibuprofen. She's maintained her extension and approximately 110 of knee flexion.  She has a new problem today bilateral numbness and tingling of the hand inability to sleep at night difficulty holding various objects but no evidence or issues with dropping things at this point  The numbness and tingling is in both hands. No skin changes.  No fever  Past Medical History  Diagnosis Date  . Obesity   . Chronic low back pain   . Depression with anxiety   . Constipation   . Kidney stone     BP 138/92  Ht 5\' 6"  (1.676 m)  Wt 249 lb (112.946 kg)  BMI 40.21 kg/m2  General appearance is normal, the patient is alert and oriented x3 with normal mood and affect. She can ambulate now with no assistive device. She in place well without limp. Bilateral hand examination on inspection there is no swelling she is full range of motion there's some tenderness over the carpal tunnel the wrist joints are stable her grip strength is equal skin is intact pulses are good sensation is abnormal with median nerve distribution heaviness to touch  She's had splints  She's not had B6 or Neurontin  She is interested in injection. So we gave her bilateral carpal tunnel injection  Carpal Tunnel Injection Procedure Note  Pre-operative Diagnosis: Right and left upper extremity carpal tunnel syndrome  Post-operative Diagnosis: same  Indications: failure of conservative therapy  Procedure Details with 2 injections done as follows Verbal consent for the procedure was obtained. After a sterile alcohol prep, the skin and subcutaneous layer overlying the carpal tunnel were anesthetized with Ethyl Chloride.  A needle was advanced into the carpal tunnel space, carefully avoiding the nerve.  Free flow into the carpal  tunnel space was confirmed.  The tunnel was then injected with 1 ml of depomedrol. The area was cleansed with isopropyl alcohol and a dressing was applied.  Complications:  None; patient tolerated the procedure well.

## 2012-08-25 ENCOUNTER — Telehealth: Payer: Self-pay | Admitting: *Deleted

## 2012-08-25 NOTE — Telephone Encounter (Signed)
Patient has dentist appointment tomorrow. She had a total knee on 03/31/12. Can I call in Keflex 500 mg, with directions, take 4 tablets one hour before procedure?

## 2012-08-26 NOTE — Telephone Encounter (Signed)
Yes as per previous messages regarding antibiotics for total joints this is a standing order you do not need to call me every time if they're allergic to penicillin then clindamycin

## 2012-08-26 NOTE — Telephone Encounter (Signed)
Called in medication [facility] [place]

## 2012-11-13 ENCOUNTER — Ambulatory Visit: Payer: BC Managed Care – PPO | Admitting: Orthopedic Surgery

## 2012-11-13 ENCOUNTER — Encounter: Payer: Self-pay | Admitting: Orthopedic Surgery

## 2012-11-21 ENCOUNTER — Ambulatory Visit (INDEPENDENT_AMBULATORY_CARE_PROVIDER_SITE_OTHER): Payer: BC Managed Care – PPO | Admitting: Nurse Practitioner

## 2012-11-21 ENCOUNTER — Encounter: Payer: Self-pay | Admitting: Nurse Practitioner

## 2012-11-21 VITALS — BP 128/80 | Ht 66.0 in | Wt 200.4 lb

## 2012-11-21 DIAGNOSIS — M62838 Other muscle spasm: Secondary | ICD-10-CM

## 2012-11-21 DIAGNOSIS — G44209 Tension-type headache, unspecified, not intractable: Secondary | ICD-10-CM

## 2012-11-21 DIAGNOSIS — G43009 Migraine without aura, not intractable, without status migrainosus: Secondary | ICD-10-CM

## 2012-11-21 MED ORDER — METHOCARBAMOL 750 MG PO TABS: 750.0000 mg | ORAL_TABLET | Freq: Three times a day (TID) | ORAL | Status: DC

## 2012-11-21 MED ORDER — RIZATRIPTAN BENZOATE 10 MG PO TBDP: 10.0000 mg | ORAL_TABLET | ORAL | Status: DC | PRN

## 2012-11-21 NOTE — Patient Instructions (Addendum)
Icy Hot Smart Relief Massage Ice and heat applications

## 2012-11-24 ENCOUNTER — Encounter: Payer: Self-pay | Admitting: Nurse Practitioner

## 2012-11-24 DIAGNOSIS — G43009 Migraine without aura, not intractable, without status migrainosus: Secondary | ICD-10-CM | POA: Insufficient documentation

## 2012-11-24 DIAGNOSIS — G44209 Tension-type headache, unspecified, not intractable: Secondary | ICD-10-CM | POA: Insufficient documentation

## 2012-11-24 NOTE — Progress Notes (Signed)
Subjective:  Presents complaints of headaches that began about 3 weeks ago. Occurs about every day. Last off-and-on for several hours. Describes as a nagging headache mainly in the occipital area, occasionally in the facial area. No specific triggers. Relieved with ibuprofen. Taking daily analgesics, one Goody powder and ibuprofen. No head congestion runny nose cough sore throat or ear pain. Has a remote history of migraines. Minimal caffeine intake. This week patient did have one headache that was sharper with nausea, no vomiting. Photosensitivity. Phonophobia. No visual changes. No numbness or weakness of the face arms or legs. This is the first migraine that she has had in years. Relieved with rest in a dark room. No migraines since this headache. Has an appointment for routine eye exam next month.  Objective:   BP 128/80  Ht 5\' 6"  (1.676 m)  Wt 200 lb 6.4 oz (90.901 kg)  BMI 32.36 kg/m2 NAD. Alert, oriented. TMs normal limit. Pharynx clear. Neck supple with minimal adenopathy. Lungs clear. Heart regular rate rhythm. Extremely tight tender muscles along the lateral neck area, also in the cervical/trapezius area. Good ROM of the neck with minimal tenderness. Good ROM of both shoulders. Hand and arm strength 5+ bilateral. Radial pulses strong. Sensation grossly intact.  Assessment:Muscle contraction headache  Migraine headache without aura  Muscle spasms of head and/or neck  Plan: Meds ordered this encounter  Medications  . methocarbamol (ROBAXIN) 750 MG tablet    Sig: Take 1 tablet (750 mg total) by mouth 3 (three) times daily. Prn muscle spasms    Dispense:  30 tablet    Refill:  0    Order Specific Question:  Supervising Provider    Answer:  Merlyn Albert [[id number]]  . rizatriptan (MAXALT-MLT) 10 MG disintegrating tablet    Sig: Take 1 tablet (10 mg total) by mouth as needed for migraine. May repeat in 2 hours if needed; max 2 per 24 hrs.    Dispense:  10 tablet    Refill:  0    Order  Specific Question:  Supervising Provider    Answer:  Merlyn Albert [[id number]]   Ice/heat to the neck and upper back area. TENS unit as directed. Discussed importance of stress reduction. Massage therapy. Given prescription for Maxalt as a precaution. Will wait to see if any further migraines. Recheck if headaches persist. Also recommend patient come back on her analgesic use particularly her Goody powders. Has chronic orthopedic issues that require the use of anti-inflammatories.

## 2012-11-24 NOTE — Assessment & Plan Note (Signed)
Ice/heat to the neck and upper back area. TENS unit as directed. Discussed importance of stress reduction. Massage therapy. Given prescription for Maxalt as a precaution. Will wait to see if any further migraines. Recheck if headaches persist. Also recommend patient come back on her analgesic use particularly her Goody powders. Has chronic orthopedic issues that require the use of anti-inflammatories.

## 2012-11-24 NOTE — Assessment & Plan Note (Signed)
Ice/heat to the neck and upper back area. TENS unit as directed. Discussed importance of stress reduction. Massage therapy. Given prescription for Maxalt as a precaution. Will wait to see if any further migraines. Recheck if headaches persist. Also recommend patient come back on her analgesic use particularly her Goody powders. Has chronic orthopedic issues that require the use of anti-inflammatories. 

## 2012-11-27 ENCOUNTER — Ambulatory Visit (INDEPENDENT_AMBULATORY_CARE_PROVIDER_SITE_OTHER): Payer: BC Managed Care – PPO | Admitting: Orthopedic Surgery

## 2012-11-27 VITALS — BP 152/85 | Ht 66.0 in | Wt 199.0 lb

## 2012-11-27 DIAGNOSIS — G5600 Carpal tunnel syndrome, unspecified upper limb: Secondary | ICD-10-CM

## 2012-11-27 NOTE — Progress Notes (Signed)
Patient ID: Erica Graves, female   DOB: 09-Feb-1966, 47 y.o.   MRN: [id number]   Chief Complaint  Patient presents with  . Follow-up    3 month recheck Bilateral Carpal Tunnel Syndrome    BP 152/85  Ht 5\' 6"  (1.676 m)  Wt 199 lb (90.266 kg)  BMI 32.13 kg/m2  The patient would like bilateral injections for carpal tunnel syndrome which helped her significantly last time.  So we agree that based on her symptoms of numbness and tingling we should proceed  Carpal Tunnel Injection Procedure Note  Pre-operative Diagnosis: right and left carpal tunnel syndrome  Post-operative Diagnosis: same  Indications: failure of conservative therapy  Procedure Details for both wrists Verbal consent for the procedure was obtained. After a sterile alcohol prep, the skin and subcutaneous layer overlying the carpal tunnel were anesthetized with Ethyl Chloride.  A needle was advanced into the carpal tunnel space, carefully avoiding the nerve.  Free flow into the carpal tunnel space was confirmed.  The tunnel was then injected with 1 ml of depomedrol. The area was cleansed with isopropyl alcohol and a dressing was applied.  Complications:  None; patient tolerated the procedure well.

## 2012-11-27 NOTE — Patient Instructions (Addendum)
You have received a steroid shot. 15% of patients experience increased pain at the injection site with in the next 24 hours. This is best treated with ice and tylenol extra strength 2 tabs every 8 hours. If you are still having pain please call the office.    

## 2013-03-18 ENCOUNTER — Other Ambulatory Visit: Payer: Self-pay | Admitting: Family Medicine

## 2013-03-18 DIAGNOSIS — Z139 Encounter for screening, unspecified: Secondary | ICD-10-CM

## 2013-03-23 ENCOUNTER — Ambulatory Visit (HOSPITAL_COMMUNITY)
Admission: RE | Admit: 2013-03-23 | Discharge: 2013-03-23 | Disposition: A | Discharge status: Home / Self Care | Payer: BC Managed Care – PPO | Source: Ambulatory Visit | Attending: Family Medicine | Admitting: Family Medicine

## 2013-03-23 DIAGNOSIS — Z1231 Encounter for screening mammogram for malignant neoplasm of breast: Secondary | ICD-10-CM | POA: Insufficient documentation

## 2013-03-23 DIAGNOSIS — Z139 Encounter for screening, unspecified: Secondary | ICD-10-CM

## 2013-04-16 ENCOUNTER — Ambulatory Visit (INDEPENDENT_AMBULATORY_CARE_PROVIDER_SITE_OTHER): Payer: BC Managed Care – PPO | Admitting: Orthopedic Surgery

## 2013-04-16 ENCOUNTER — Ambulatory Visit (INDEPENDENT_AMBULATORY_CARE_PROVIDER_SITE_OTHER): Payer: BC Managed Care – PPO

## 2013-04-16 VITALS — BP 145/88 | Ht 66.0 in | Wt 205.0 lb

## 2013-04-16 DIAGNOSIS — Z96659 Presence of unspecified artificial knee joint: Secondary | ICD-10-CM

## 2013-04-16 DIAGNOSIS — Z96652 Presence of left artificial knee joint: Secondary | ICD-10-CM

## 2013-04-16 DIAGNOSIS — G5600 Carpal tunnel syndrome, unspecified upper limb: Secondary | ICD-10-CM

## 2013-04-16 MED ORDER — GABAPENTIN 100 MG PO CAPS: 100.0000 mg | ORAL_CAPSULE | Freq: Every day | ORAL | Status: DC

## 2013-04-16 MED ORDER — CEPHALEXIN 500 MG PO CAPS: 500.0000 mg | ORAL_CAPSULE | ORAL | Status: DC

## 2013-04-16 NOTE — Patient Instructions (Signed)
Activities as tolerated. 

## 2013-04-17 ENCOUNTER — Encounter: Payer: Self-pay | Admitting: Orthopedic Surgery

## 2013-04-17 NOTE — Progress Notes (Signed)
Patient ID: [name], female   DOB: 10-19-1965, 48 y.o.   MRN: [id number] Chief Complaint  Patient presents with  . Follow-up    Yearly recheck on left knee TKA, DOS 03-31-12.     Complains of carpal tunnel symptoms again. Previous carpal tunnel symptoms were treated with injection, vitamin B6 which she is still taking. She is still splinting. She would like something else done other than surgery.  As far as her left knee goes she is doing well she has some pain when the weather turns cold with some aching. Otherwise she's working and doing well without any major arthritic complaints were pain in the left knee  BP 145/88  Ht 5\' 6"  (1.676 m)  Wt 205 lb (92.987 kg)  BMI 33.10 kg/m2 General appearance is normal, the patient is alert and oriented x3 with normal mood and affect. Left knee incision has healed well. No tenderness around the incision. No swelling in the joint. Knee flexion 110. Full extension. Good quadriceps strength grade 5. Neurovascular exam intact in the lower extremity  Encounter Diagnoses  Name Primary?  . Status post left knee replacement Yes  . CTS (carpal tunnel syndrome)   . S/P total knee replacement      As far as a total knee goes we will see her again in a year for x-rays. As far as her carpal tunnel symptoms we will try gabapentin 100 mg at night  Followup one year x-rays of the total knee on the left

## 2013-05-25 ENCOUNTER — Ambulatory Visit: Payer: BC Managed Care – PPO | Admitting: Gastroenterology

## 2013-06-18 ENCOUNTER — Encounter (INDEPENDENT_AMBULATORY_CARE_PROVIDER_SITE_OTHER): Payer: Self-pay

## 2013-06-18 ENCOUNTER — Ambulatory Visit (INDEPENDENT_AMBULATORY_CARE_PROVIDER_SITE_OTHER): Payer: BC Managed Care – PPO | Admitting: Gastroenterology

## 2013-06-18 ENCOUNTER — Encounter: Payer: Self-pay | Admitting: Gastroenterology

## 2013-06-18 ENCOUNTER — Other Ambulatory Visit: Payer: Self-pay | Admitting: Gastroenterology

## 2013-06-18 VITALS — BP 142/86 | HR 70 | Temp 97.6°F | Ht 65.0 in | Wt 210.6 lb

## 2013-06-18 DIAGNOSIS — Z1211 Encounter for screening for malignant neoplasm of colon: Secondary | ICD-10-CM

## 2013-06-18 DIAGNOSIS — K5900 Constipation, unspecified: Secondary | ICD-10-CM

## 2013-06-18 MED ORDER — PEG 3350-KCL-NA BICARB-NACL 420 G PO SOLR: 4000.0000 mL | ORAL | Status: DC

## 2013-06-18 MED ORDER — LINACLOTIDE 145 MCG PO CAPS: 145.0000 ug | ORAL_CAPSULE | Freq: Every day | ORAL | Status: DC

## 2013-06-18 NOTE — Assessment & Plan Note (Addendum)
Chronic constipation without any concerning features. No prior colonoscopy. Will trial Linzess 145 mcg daily and set up for routine screening TCS.  Proceed with colonoscopy with Dr. Darrick PennaFields in the near future. The risks, benefits, and alternatives have been discussed in detail with the patient. They state understanding and desire to proceed.  3 month f/u

## 2013-06-18 NOTE — Progress Notes (Signed)
Primary Care Physician:  Lilyan PuntLUKING,SCOTT, MD Primary Gastroenterologist:  Dr. Darrick PennaFields   Chief Complaint  Patient presents with  . Constipation    HPI:   Erica Graves presents today as a self-referral for constipation. Complains of constipation, bloating, gassy. Present for a few months. Some stress. No medication changes. No rectal bleeding. Occasional abdominal discomfort but more bloated. No weight loss, lack of appetite. No prior colonoscopy. Takes a laxative prn. Will go up to 3-4 days without BM. Small balls. No concerning upper GI symptoms.   Past Medical History  Diagnosis Date  . Obesity   . Chronic low back pain   . Depression with anxiety   . Constipation   . Kidney stone     Past Surgical History  Procedure Laterality Date  . Left knee arthroscopy  08/14/2005 Dr. Romeo AppleHarrison torn meniscus  . Knee arthroscopy    . Partial hysterectomy    . Lumbar spine surgery    . Back surgery    . Knee arthroscopy  05/21/2011    Procedure: ARTHROSCOPY KNEE;  Surgeon: Vickki HearingStanley E Harrison, MD;  Location: [facility];  Service: Orthopedics;  Laterality: Left;  lateral menisectomy  . Abdominal hysterectomy    . Total knee arthroplasty  03/31/2012    Procedure: TOTAL KNEE ARTHROPLASTY;  Surgeon: Vickki HearingStanley E Harrison, MD;  Location: [facility];  Service: Orthopedics;  Laterality: Left;  Left Total Knee Arthroplasty  . Knee closed reduction Left 05/26/2012    Procedure: CLOSED MANIPULATION KNEE;  Surgeon: Vickki HearingStanley E Harrison, MD;  Location: [facility];  Service: Orthopedics;  Laterality: Left;    No current outpatient prescriptions on file.   No current facility-administered medications for this visit.    Allergies as of 06/18/2013  . (No Known Allergies)    Family History  Problem Relation Age of Onset  . Colon cancer Neg Hx     History   Social History  . Marital Status: Married    Spouse Name: N/A    Number of Children: 3  . Years of Education: college   Occupational History  .  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     Social History Main Topics  . Smoking status: Never Smoker   . Smokeless tobacco: Not on file  . Alcohol Use: No  . Drug Use: No  . Sexual Activity: Yes    Birth Control/ Protection: Surgical   Other Topics Concern  . Not on file   Social History Narrative  . No narrative on file    Review of Systems: As mentioned in HPI.   Physical Exam: BP 142/86  Pulse 70  Temp(Src) 97.6 F (36.4 C) (Oral)  Ht 5\' 5"  (1.651 m)  Wt 210 lb 9.6 oz (95.528 kg)  BMI 35.05 kg/m2 General:   Alert and oriented. Pleasant and cooperative. Well-nourished and well-developed. Appears much younger than stated age.  Head:  Normocephalic and atraumatic. Eyes:  Without icterus, sclera clear and conjunctiva pink.  Ears:  Normal auditory acuity. Nose:  No deformity, discharge,  or lesions. Mouth:  No deformity or lesions, oral mucosa pink.  Lungs:  Clear to auscultation bilaterally. No wheezes, rales, or rhonchi. No distress.  Heart:  S1, S2 present without murmurs appreciated.  Abdomen:  +BS, soft, non-tender and non-distended. No HSM noted. No guarding or rebound. Umbilical hernia, easily reducible. Rectal:  Deferred until colonoscopy Msk:  Symmetrical without gross deformities. Normal posture. Extremities:  Without clubbing  or edema. Neurologic:  Alert and  oriented x4;  grossly normal neurologically. Skin:  Intact without significant lesions or rashes. Psych:  Alert and cooperative. Normal mood and affect.

## 2013-06-18 NOTE — Patient Instructions (Signed)
For constipation: start taking Linzess 1 capsule 30 minutes before breakfast daily. I have provided a voucher and sent refills to your pharmacy.   We have scheduled a colonoscopy with Dr. Darrick PennaFields in the future for routine screening purposes.   We will see you in 3 months; if you are doing well, you can postpone that visit.

## 2013-06-22 NOTE — Progress Notes (Signed)
cc'd to pcp 

## 2013-06-29 ENCOUNTER — Encounter (HOSPITAL_COMMUNITY)
Admission: RE | Disposition: A | Discharge status: Home / Self Care | Payer: Self-pay | Source: Ambulatory Visit | Attending: Gastroenterology

## 2013-06-29 ENCOUNTER — Encounter (HOSPITAL_COMMUNITY): Payer: Self-pay

## 2013-06-29 ENCOUNTER — Ambulatory Visit (HOSPITAL_COMMUNITY)
Admission: RE | Admit: 2013-06-29 | Discharge: 2013-06-29 | Disposition: A | Discharge status: Home / Self Care | Payer: BC Managed Care – PPO | Source: Ambulatory Visit | Attending: Gastroenterology | Admitting: Gastroenterology

## 2013-06-29 DIAGNOSIS — K648 Other hemorrhoids: Secondary | ICD-10-CM | POA: Insufficient documentation

## 2013-06-29 DIAGNOSIS — R198 Other specified symptoms and signs involving the digestive system and abdomen: Secondary | ICD-10-CM | POA: Insufficient documentation

## 2013-06-29 DIAGNOSIS — Z1211 Encounter for screening for malignant neoplasm of colon: Secondary | ICD-10-CM

## 2013-06-29 DIAGNOSIS — K5730 Diverticulosis of large intestine without perforation or abscess without bleeding: Secondary | ICD-10-CM | POA: Insufficient documentation

## 2013-06-29 DIAGNOSIS — Q438 Other specified congenital malformations of intestine: Secondary | ICD-10-CM | POA: Insufficient documentation

## 2013-06-29 DIAGNOSIS — E669 Obesity, unspecified: Secondary | ICD-10-CM | POA: Insufficient documentation

## 2013-06-29 HISTORY — PX: COLONOSCOPY: SHX5424

## 2013-06-29 SURGERY — COLONOSCOPY
Anesthesia: Moderate Sedation

## 2013-06-29 MED ORDER — MIDAZOLAM HCL 5 MG/5ML IJ SOLN
INTRAMUSCULAR | Status: DC | PRN
  Administered 2013-06-29: 1 mg via INTRAVENOUS
  Administered 2013-06-29: 2 mg via INTRAVENOUS
  Administered 2013-06-29: 1 mg via INTRAVENOUS
  Administered 2013-06-29: 2 mg via INTRAVENOUS

## 2013-06-29 MED ORDER — SODIUM CHLORIDE 0.9 % IV SOLN
INTRAVENOUS | Status: DC
  Administered 2013-06-29: 13:00:00 via INTRAVENOUS

## 2013-06-29 MED ORDER — MEPERIDINE HCL 100 MG/ML IJ SOLN
INTRAMUSCULAR | Status: AC
  Filled 2013-06-29: qty 2

## 2013-06-29 MED ORDER — MIDAZOLAM HCL 5 MG/5ML IJ SOLN
INTRAMUSCULAR | Status: AC
  Filled 2013-06-29: qty 10

## 2013-06-29 MED ORDER — MEPERIDINE HCL 100 MG/ML IJ SOLN
INTRAMUSCULAR | Status: DC | PRN
  Administered 2013-06-29 (×4): 25 mg via INTRAVENOUS

## 2013-06-29 MED ORDER — SIMETHICONE 40 MG/0.6ML PO SUSP
ORAL | Status: DC | PRN
  Administered 2013-06-29: 13:00:00

## 2013-06-29 NOTE — H&P (Signed)
  Primary Care Physician:  Lilyan PuntLUKING,SCOTT, MD Primary Gastroenterologist:  Dr. Darrick PennaFields  Pre-Procedure History & Physical: HPI:  Erica Graves is a 48 y.o. female here for CHANGE IN BOWEL HABITS   Past Medical History  Diagnosis Date  . Obesity   . Chronic low back pain   . Depression with anxiety   . Constipation   . Kidney stone     Past Surgical History  Procedure Laterality Date  . Left knee arthroscopy  08/14/2005 Dr. Romeo AppleHarrison torn meniscus  . Knee arthroscopy    . Partial hysterectomy    . Lumbar spine surgery    . Back surgery    . Knee arthroscopy  05/21/2011    Procedure: ARTHROSCOPY KNEE;  Surgeon: Vickki HearingStanley E Harrison, MD;  Location: [facility];  Service: Orthopedics;  Laterality: Left;  lateral menisectomy  . Abdominal hysterectomy    . Total knee arthroplasty  03/31/2012    Procedure: TOTAL KNEE ARTHROPLASTY;  Surgeon: Vickki HearingStanley E Harrison, MD;  Location: [facility];  Service: Orthopedics;  Laterality: Left;  Left Total Knee Arthroplasty  . Knee closed reduction Left 05/26/2012    Procedure: CLOSED MANIPULATION KNEE;  Surgeon: Vickki HearingStanley E Harrison, MD;  Location: [facility];  Service: Orthopedics;  Laterality: Left;    Prior to Admission medications   Medication Sig Start Date End Date Taking? Authorizing Provider  polyethylene glycol-electrolytes (TRILYTE) 420 G solution Take 4,000 mLs by mouth as directed. 06/18/13  Yes West BaliSandi L Ziyonna Christner, MD  Linaclotide (LINZESS) 145 MCG CAPS capsule Take 1 capsule (145 mcg total) by mouth daily. 30 minutes before breakfast. 06/18/13   Nira RetortAnna W Sams, NP    Allergies as of 06/18/2013  . (No Known Allergies)    Family History  Problem Relation Age of Onset  . Colon cancer Neg Hx     History   Social History  . Marital Status: Married    Spouse Name: N/A    Number of Children: 3  . Years of Education: college   Occupational History  . 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     Social History Main Topics  . Smoking  status: Never Smoker   . Smokeless tobacco: Not on file  . Alcohol Use: No  . Drug Use: No  . Sexual Activity: Yes    Birth Control/ Protection: Surgical   Other Topics Concern  . Not on file   Social History Narrative  . No narrative on file    Review of Systems: See HPI, otherwise negative ROS   Physical Exam: BP 146/86  Pulse 75  Temp(Src) 97.8 F (36.6 C) (Oral)  Resp 18  SpO2 98% General:   Alert,  pleasant and cooperative in NAD Head:  Normocephalic and atraumatic. Neck:  Supple; Lungs:  Clear throughout to auscultation.    Heart:  Regular rate and rhythm. Abdomen:  Soft, nontender and nondistended. Normal bowel sounds, without guarding, and without rebound.   Neurologic:  Alert and  oriented x4;  grossly normal neurologically.  Impression/Plan:     CHANGE IN BOWEL HABITS  Plan: 1. TCS TODAY

## 2013-06-29 NOTE — Discharge Instructions (Signed)
You DID NOT HAVE ANY POLYPS. YOU HAVE DIVERTICULOSIS IN YOUR LEFT and RIGHT COLON. You have SMALL internal hemorrhoids.    CONTINUE YOUR WEIGHT LOSS EFFORTS. OBESITY IS ASSOCIATED WITH INCREASED RISK OF GETTING ALL CANCERS, INCLUDING COLON CANCER.  SEE INSTRUCTIONS BELOW TO MANAGE YOUR CONSTIPATION.  TAKE LINZESS FOR CONSTIPATION.  DRINK WATER TO KEEP URINE LIGHT YELLOW.  FOLLOW A HIGH FIBER DIET. SEE INFO BELOW.  FOLLOW UP IN 3 MOS.   Next colonoscopy in 10 years.   Colonoscopy Care After Read the instructions outlined below and refer to this sheet in the next week. These discharge instructions provide you with general information on caring for yourself after you leave the hospital. While your treatment has been planned according to the most current medical practices available, unavoidable complications occasionally occur. If you have any problems or questions after discharge, call DR. Bralynn Donado, [phone].  ACTIVITY  You may resume your regular activity, but move at a slower pace for the next 24 hours.   Take frequent rest periods for the next 24 hours.   Walking will help get rid of the air and reduce the bloated feeling in your belly (abdomen).   No driving for 24 hours (because of the medicine (anesthesia) used during the test).   You may shower.   Do not sign any important legal documents or operate any machinery for 24 hours (because of the anesthesia used during the test).    NUTRITION  Drink plenty of fluids.   You may resume your normal diet as instructed by your doctor.   Begin with a light meal and progress to your normal diet. Heavy or fried foods are harder to digest and may make you feel sick to your stomach (nauseated).   Avoid alcoholic beverages for 24 hours or as instructed.    MEDICATIONS  You may resume your normal medications.   WHAT YOU CAN EXPECT TODAY  Some feelings of bloating in the abdomen.   Passage of more gas than usual.    Spotting of blood in your stool or on the toilet paper  .  IF YOU HAD POLYPS REMOVED DURING THE COLONOSCOPY:  Eat a soft diet IF YOU HAVE NAUSEA, BLOATING, ABDOMINAL PAIN, OR VOMITING.    FINDING OUT THE RESULTS OF YOUR TEST Not all test results are available during your visit. DR. Darrick PennaFIELDS WILL CALL YOU WITHIN 7 DAYS OF YOUR PROCEDUE WITH YOUR RESULTS. Do not assume everything is normal if you have not heard from DR. Leander Tout IN ONE WEEK, CALL HER OFFICE AT [phone].  SEEK IMMEDIATE MEDICAL ATTENTION AND CALL THE OFFICE: [phone] IF:  You have more than a spotting of blood in your stool.   Your belly is swollen (abdominal distention).   You are nauseated or vomiting.   You have a temperature over 101F.   You have abdominal pain or discomfort that is severe or gets worse throughout the day.   Constipation in Adults Constipation is having fewer than 2 bowel movements per week. Usually, the stools are hard. As we grow older, constipation is more common. If you try to fix constipation with laxatives, the problem may get worse. This is because laxatives taken over a long period of time make the colon muscles weaker. A low-fiber diet, not taking in enough fluids, and taking some medicines may make these problems worse.  HOME CARE INSTRUCTIONS  Constipation is usually best cared for without medicines. Increasing dietary fiber and eating more fruits and vegetables is the best  way to manage constipation.   Slowly increase fiber intake to 25 to 38 grams per day. Whole grains, fruits, vegetables, and legumes are good sources of fiber. A dietitian can further help you incorporate high-fiber foods into your diet.   Drink enough water and fluids to keep your urine clear or pale yellow.   A fiber supplement may be added to your diet if you cannot get enough fiber from foods.   Increasing your activities also helps improve regularity.   Stronger measures, such as magnesium sulfate,  should be avoided if possible. This may cause uncontrollable diarrhea. Using magnesium sulfate may not allow you time to make it to the bathroom.    High-Fiber Diet A high-fiber diet changes your normal diet to include more whole grains, legumes, fruits, and vegetables. Changes in the diet involve replacing refined carbohydrates with unrefined foods. The calorie level of the diet is essentially unchanged. The Dietary Reference Intake (recommended amount) for adult males is 38 grams per day. For adult females, it is 25 grams per day. Pregnant and lactating women should consume 28 grams of fiber per day. Fiber is the intact part of a plant that is not broken down during digestion. Functional fiber is fiber that has been isolated from the plant to provide a beneficial effect in the body. PURPOSE  Increase stool bulk.   Ease and regulate bowel movements.   Lower cholesterol.  INDICATIONS THAT YOU NEED MORE FIBER  Constipation and hemorrhoids.   Uncomplicated diverticulosis (intestine condition) and irritable bowel syndrome.   Weight management.   As a protective measure against hardening of the arteries (atherosclerosis), diabetes, and cancer.   GUIDELINES FOR INCREASING FIBER IN THE DIET  Start adding fiber to the diet slowly. A gradual increase of about 5 more grams (2 slices of whole-wheat bread, 2 servings of most fruits or vegetables, or 1 bowl of high-fiber cereal) per day is best. Too rapid an increase in fiber may result in constipation, flatulence, and bloating.   Drink enough water and fluids to keep your urine clear or pale yellow. Water, juice, or caffeine-free drinks are recommended. Not drinking enough fluid may cause constipation.   Eat a variety of high-fiber foods rather than one type of fiber.   Try to increase your intake of fiber through using high-fiber foods rather than fiber pills or supplements that contain small amounts of fiber.   The goal is to change the types  of food eaten. Do not supplement your present diet with high-fiber foods, but replace foods in your present diet.  INCLUDE A VARIETY OF FIBER SOURCES  Replace refined and processed grains with whole grains, canned fruits with fresh fruits, and incorporate other fiber sources. White rice, white breads, and most bakery goods contain little or no fiber.   Brown whole-grain rice, buckwheat oats, and many fruits and vegetables are all good sources of fiber. These include: broccoli, Brussels sprouts, cabbage, cauliflower, beets, sweet potatoes, white potatoes (skin on), carrots, tomatoes, eggplant, squash, berries, fresh fruits, and dried fruits.   Cereals appear to be the richest source of fiber. Cereal fiber is found in whole grains and bran. Bran is the fiber-rich outer coat of cereal grain, which is largely removed in refining. In whole-grain cereals, the bran remains. In breakfast cereals, the largest amount of fiber is found in those with "bran" in their names. The fiber content is sometimes indicated on the label.   You may need to include additional fruits and vegetables each day.  In baking, for 1 cup white flour, you may use the following substitutions:   1 cup whole-wheat flour minus 2 tablespoons.   1/2 cup white flour plus 1/2 cup whole-wheat flour.   Diverticulosis Diverticulosis is a common condition that develops when small pouches (diverticula) form in the wall of the colon. The risk of diverticulosis increases with age. It happens more often in people who eat a low-fiber diet. Most individuals with diverticulosis have no symptoms. Those individuals with symptoms usually experience belly (abdominal) pain, constipation, or loose stools (diarrhea).  HOME CARE INSTRUCTIONS  Increase the amount of fiber in your diet as directed by your caregiver or dietician. This may reduce symptoms of diverticulosis.   Drink at least 6 to 8 glasses of water each day to prevent constipation.   Try  not to strain when you have a bowel movement.   THERE IS NO NEED TO Avoid nuts and seeds to prevent complications.   FOODS HAVING HIGH FIBER CONTENT INCLUDE:  Fruits. Apple, peach, pear, tangerine, raisins, prunes.   Vegetables. Brussels sprouts, asparagus, broccoli, cabbage, carrot, cauliflower, romaine lettuce, spinach, summer squash, tomato, winter squash, zucchini.   Starchy Vegetables. Baked beans, kidney beans, lima beans, split peas, lentils, potatoes (with skin).   Grains. Whole wheat bread, brown rice, bran flake cereal, plain oatmeal, white rice, shredded wheat, bran muffins.    Hemorrhoids Hemorrhoids are dilated (enlarged) veins around the rectum. Sometimes clots will form in the veins. This makes them swollen and painful. These are called thrombosed hemorrhoids. Causes of hemorrhoids include:  Constipation.   Straining to have a bowel movement.   HEAVY LIFTING HOME CARE INSTRUCTIONS  Eat a well balanced diet and drink 6 to 8 glasses of water every day to avoid constipation. You may also use a bulk laxative.   Avoid straining to have bowel movements.   Keep anal area dry and clean.   Do not use a donut shaped pillow or sit on the toilet for long periods. This increases blood pooling and pain.   Move your bowels when your body has the urge; this will require less straining and will decrease pain and pressure.

## 2013-06-29 NOTE — Progress Notes (Signed)
REVIEWED.  

## 2013-06-29 NOTE — Op Note (Signed)
[facility] [address] [place], [id number]   COLONOSCOPY PROCEDURE REPORT  PATIENT: Erica Graves, Erica M.  MR#: [id number] BIRTHDATE: Jul 01, 1965 , 48  yrs. old GENDER: Female ENDOSCOPIST: Jonette EvaSandi Yunis Voorheis, MD REFERRED XB:JYNWGBY:Scott Gerda DissLuking, GravesD. PROCEDURE DATE:  06/29/2013 PROCEDURE:   Colonoscopy, screening INDICATIONS:Change in bowel habits.: CONSTIPATION. PENDING TRIAL OF LINZESS. MEDICATIONS: Demerol 100 mg IV and Versed 6 mg IV  DESCRIPTION OF PROCEDURE:    Physical exam was performed.  Informed consent was obtained from the patient after explaining the benefits, risks, and alternatives to procedure.  The patient was connected to monitor and placed in left lateral position. Continuous oxygen was provided by nasal cannula and IV medicine administered through an indwelling cannula.  After administration of sedation and rectal exam, the patients rectum was intubated and the EC-[id number] ([id number]  colonoscope was advanced under direct visualization to the ileum.  The scope was removed slowly by carefully examining the color, texture, anatomy, and integrity mucosa on the way out.  The patient was recovered in endoscopy and discharged home in satisfactory condition.       COLON FINDINGS: The mucosa appeared normal in the terminal ileum.  , Mild diverticulosis was noted in the transverse colon, descending colon, and sigmoid colon.  , The LEFT colon IS SLIGHTLY redundant. Manual abdominal counter-pressure was used to reach the cecum, The colon mucosa was otherwise normal.  , and Small internal hemorrhoids were found.  PREP QUALITY: good.  CECAL W/D TIME: 17 minutes     COMPLICATIONS: None  ENDOSCOPIC IMPRESSION: 1.   Normal mucosa in the terminal ileum 2.   Mild diverticulosis in the transverse colon, descending colon, and sigmoid colon 3.   The LEFT colon IS SLIGHTLY redundant 4.   Small internal hemorrhoids   RECOMMENDATIONS: CONTINUE WEIGHT LOSS  EFFORTS. INSTRUCTIONS GIVEN TO MANAGE CONSTIPATION. TAKE LINZESS FOR CONSTIPATION. DRINK WATER TO KEEP URINE LIGHT YELLOW. FOLLOW A HIGH FIBER DIET.  FOLLOW UP IN 3 MOS. Next colonoscopy in 10 years.     _______________________________ Rosalie DoctoreSignedJonette Eva:  Sheryl Saintil, MD 06/29/2013 2:21 PM

## 2013-07-02 ENCOUNTER — Encounter (HOSPITAL_COMMUNITY): Payer: Self-pay | Admitting: Gastroenterology

## 2013-07-30 ENCOUNTER — Ambulatory Visit (INDEPENDENT_AMBULATORY_CARE_PROVIDER_SITE_OTHER): Payer: BC Managed Care – PPO | Admitting: Orthopedic Surgery

## 2013-07-30 DIAGNOSIS — G5600 Carpal tunnel syndrome, unspecified upper limb: Secondary | ICD-10-CM

## 2013-07-30 NOTE — Progress Notes (Signed)
Patient ID: Erica Graves, female   DOB: 01/28/1966, 48 y.o.   MRN: [id number] Long-standing carpal tunnel syndrome status post previous injections with good relief request bilateral injections of the carpal tunnel  .Carpal Tunnel Injection Procedure Note  Pre-operative Diagnosis: right and left carpal tunnel syndrome  Post-operative Diagnosis: same  Indications: failure of conservative therapy  Procedure Details right  Verbal consent for the procedure was obtained. After a sterile alcohol prep, the skin and subcutaneous layer overlying the carpal tunnel were anesthetized with Ethyl Chloride.  A needle was advanced into the carpal tunnel space, carefully avoiding the nerve.  Free flow into the carpal tunnel space was confirmed.  The tunnel was then injected with 1 ml of depomedrol. The area was cleansed with isopropyl alcohol and a dressing was applied.  Complications:  None; patient tolerated the procedure well.  Repeated left

## 2013-09-17 ENCOUNTER — Ambulatory Visit: Payer: BC Managed Care – PPO | Admitting: Gastroenterology

## 2013-09-21 ENCOUNTER — Telehealth: Payer: Self-pay | Admitting: Family Medicine

## 2013-09-21 NOTE — Telephone Encounter (Signed)
Patient started having swelling in both legs last week and wanted to make an appointment with Erica Graves her next available, but feels she cannot wait until next Friday when the next available would be. Please advise.

## 2013-09-22 NOTE — Telephone Encounter (Signed)
Give appt in a same day slot thur with carolyn

## 2013-09-22 NOTE — Telephone Encounter (Signed)
LMOM for patient to call back and schedule appointment.

## 2013-09-23 NOTE — Telephone Encounter (Signed)
[facility] appointment for 09[facility]24/13 with Eber Jonesarolyn, per Dr. Brett CanalesSteve.

## 2013-09-23 NOTE — Telephone Encounter (Signed)
appt made

## 2013-09-24 ENCOUNTER — Ambulatory Visit (INDEPENDENT_AMBULATORY_CARE_PROVIDER_SITE_OTHER): Payer: BC Managed Care – PPO | Admitting: Nurse Practitioner

## 2013-09-24 ENCOUNTER — Encounter: Payer: Self-pay | Admitting: Nurse Practitioner

## 2013-09-24 VITALS — BP 124/78 | Ht 66.0 in | Wt 217.0 lb

## 2013-09-24 DIAGNOSIS — Z0189 Encounter for other specified special examinations: Secondary | ICD-10-CM

## 2013-09-24 DIAGNOSIS — R609 Edema, unspecified: Secondary | ICD-10-CM

## 2013-09-24 DIAGNOSIS — R5381 Other malaise: Secondary | ICD-10-CM

## 2013-09-24 DIAGNOSIS — R5383 Other fatigue: Secondary | ICD-10-CM

## 2013-09-24 DIAGNOSIS — L301 Dyshidrosis [pompholyx]: Secondary | ICD-10-CM

## 2013-09-24 MED ORDER — HYDROCHLOROTHIAZIDE 25 MG PO TABS: ORAL_TABLET | ORAL | Status: DC

## 2013-09-24 MED ORDER — CLOBETASOL PROPIONATE 0.05 % EX CREA: 1.0000 "application " | TOPICAL_CREAM | Freq: Two times a day (BID) | CUTANEOUS | Status: DC

## 2013-09-28 ENCOUNTER — Telehealth: Payer: Self-pay | Admitting: Gastroenterology

## 2013-09-28 ENCOUNTER — Ambulatory Visit: Payer: BC Managed Care – PPO | Admitting: Gastroenterology

## 2013-09-28 ENCOUNTER — Encounter: Payer: Self-pay | Admitting: Gastroenterology

## 2013-09-28 NOTE — Telephone Encounter (Signed)
Pt was a no show

## 2013-09-28 NOTE — Telephone Encounter (Signed)
Mailed letter °

## 2013-09-29 ENCOUNTER — Encounter: Payer: Self-pay | Admitting: Nurse Practitioner

## 2013-09-29 NOTE — Progress Notes (Signed)
Subjective:  Presents complaints of mild swelling in the lower legs that began last week. No change in diet or salt intake. Has a job that requires standing at work. No chest pain shortness of breath orthopnea. Mild fatigue. No unusual cough. Some snoring at night, denies any pauses in her breathing. Also complaints of a rash on the lateral left foot for the past few days, mildly pruritic. Nontender.  Objective:   BP 124/78  Ht 5\' 6"  (1.676 m)  Wt 217 lb (98.431 kg)  BMI 35.04 kg/m2 NAD. Alert, oriented. Lungs clear. Heart regular rate rhythm. Carotids no bruits or thrills. No JVD. Lower extremities trace-1+ pitting edema. Strong pedal pulses. Toes warm with good capillary refill. Resolving dry papular rash with some excoriation noted on the lateral left foot area.  Assessment: Edema - Plan: Basic metabolic panel  Dyshidrotic eczema  Other malaise and fatigue - Plan: CBC with Differential, Hepatic function panel, Basic metabolic panel, TSH, Vit D  25 hydroxy (rtn osteoporosis monitoring)  Other specified examination - Plan: Lipid panel  Morbid obesity   Plan:  Meds ordered this encounter  Medications  . DISCONTD: polyethylene glycol-electrolytes (NULYTELY/GOLYTELY) 420 G solution    Sig:   . hydrochlorothiazide (HYDRODIURIL) 25 MG tablet    Sig: One po q am prn swelling    Dispense:  30 tablet    Refill:  2    Order Specific Question:  Supervising Provider    Answer:  Merlyn AlbertLUKING, WILLIAM S [[id number]]  . clobetasol cream (TEMOVATE) 0.05 %    Sig: Apply 1 application topically 2 (two) times daily.    Dispense:  30 g    Refill:  0    Order Specific Question:  Supervising Provider    Answer:  Merlyn AlbertLUKING, WILLIAM S [[id number]]   Low-sodium diet. Encourage regular exercise healthy diet and weight loss. Callback in 7-10 days if no improvement, sooner if worse. Warning signs were reviewed. Routine lab work ordered. Reminded about preventive health physical. Return if symptoms worsen or fail to  improve.

## 2013-10-08 LAB — CBC WITH DIFFERENTIAL/PLATELET
BASOS ABS: 0 10*3/uL (ref 0.0–0.1)
Basophils Relative: 1 % (ref 0–1)
Eosinophils Absolute: 0.1 10*3/uL (ref 0.0–0.7)
Eosinophils Relative: 2 % (ref 0–5)
HEMATOCRIT: 39.7 % (ref 36.0–46.0)
HEMOGLOBIN: 13.2 g/dL (ref 12.0–15.0)
LYMPHS PCT: 49 % — AB (ref 12–46)
Lymphs Abs: 2 10*3/uL (ref 0.7–4.0)
MCH: 29.5 pg (ref 26.0–34.0)
MCHC: 33.2 g/dL (ref 30.0–36.0)
MCV: 88.8 fL (ref 78.0–100.0)
MONO ABS: 0.3 10*3/uL (ref 0.1–1.0)
MONOS PCT: 8 % (ref 3–12)
Neutro Abs: 1.6 10*3/uL — ABNORMAL LOW (ref 1.7–7.7)
Neutrophils Relative %: 40 % — ABNORMAL LOW (ref 43–77)
Platelets: 235 10*3/uL (ref 150–400)
RBC: 4.47 MIL/uL (ref 3.87–5.11)
RDW: 13.4 % (ref 11.5–15.5)
WBC: 4 10*3/uL (ref 4.0–10.5)

## 2013-10-08 LAB — HEPATIC FUNCTION PANEL
ALBUMIN: 4.3 g/dL (ref 3.5–5.2)
ALK PHOS: 63 U/L (ref 39–117)
ALT: 18 U/L (ref 0–35)
AST: 20 U/L (ref 0–37)
BILIRUBIN TOTAL: 0.4 mg/dL (ref 0.2–1.2)
Bilirubin, Direct: 0.1 mg/dL (ref 0.0–0.3)
Indirect Bilirubin: 0.3 mg/dL (ref 0.2–1.2)
TOTAL PROTEIN: 7.2 g/dL (ref 6.0–8.3)

## 2013-10-08 LAB — TSH: TSH: 2.046 u[IU]/mL (ref 0.350–4.500)

## 2013-10-08 LAB — BASIC METABOLIC PANEL
BUN: 20 mg/dL (ref 6–23)
CHLORIDE: 107 meq/L (ref 96–112)
CO2: 27 meq/L (ref 19–32)
Calcium: 9.4 mg/dL (ref 8.4–10.5)
Creat: 0.76 mg/dL (ref 0.50–1.10)
Glucose, Bld: 89 mg/dL (ref 70–99)
POTASSIUM: 4.6 meq/L (ref 3.5–5.3)
SODIUM: 141 meq/L (ref 135–145)

## 2013-10-08 LAB — LIPID PANEL
Cholesterol: 172 mg/dL (ref 0–200)
HDL: 47 mg/dL (ref >39)
LDL CALC: 107 mg/dL — AB (ref 0–99)
Total CHOL/HDL Ratio: 3.7 Ratio
Triglycerides: 89 mg/dL (ref <150)
VLDL: 18 mg/dL (ref 0–40)

## 2013-10-09 LAB — VITAMIN D 25 HYDROXY (VIT D DEFICIENCY, FRACTURES): VIT D 25 HYDROXY: 17 ng/mL — AB (ref 30–89)

## 2013-10-14 ENCOUNTER — Other Ambulatory Visit: Payer: Self-pay | Admitting: Nurse Practitioner

## 2013-10-14 DIAGNOSIS — E559 Vitamin D deficiency, unspecified: Secondary | ICD-10-CM

## 2013-10-14 MED ORDER — VITAMIN D (ERGOCALCIFEROL) 1.25 MG (50000 UNIT) PO CAPS: 50000.0000 [IU] | ORAL_CAPSULE | ORAL | Status: DC

## 2013-10-31 ENCOUNTER — Emergency Department (HOSPITAL_COMMUNITY)
Admission: EM | Admit: 2013-10-31 | Discharge: 2013-10-31 | Disposition: A | Discharge status: Home / Self Care | Payer: BC Managed Care – PPO | Attending: Emergency Medicine | Admitting: Emergency Medicine

## 2013-10-31 ENCOUNTER — Encounter (HOSPITAL_COMMUNITY): Payer: Self-pay | Admitting: Emergency Medicine

## 2013-10-31 ENCOUNTER — Emergency Department (HOSPITAL_COMMUNITY): Payer: BC Managed Care – PPO

## 2013-10-31 DIAGNOSIS — Z79899 Other long term (current) drug therapy: Secondary | ICD-10-CM | POA: Diagnosis not present

## 2013-10-31 DIAGNOSIS — R1310 Dysphagia, unspecified: Secondary | ICD-10-CM | POA: Diagnosis not present

## 2013-10-31 DIAGNOSIS — R0602 Shortness of breath: Secondary | ICD-10-CM | POA: Diagnosis not present

## 2013-10-31 DIAGNOSIS — R110 Nausea: Secondary | ICD-10-CM | POA: Insufficient documentation

## 2013-10-31 DIAGNOSIS — Z8659 Personal history of other mental and behavioral disorders: Secondary | ICD-10-CM | POA: Insufficient documentation

## 2013-10-31 DIAGNOSIS — R079 Chest pain, unspecified: Secondary | ICD-10-CM | POA: Diagnosis present

## 2013-10-31 DIAGNOSIS — E669 Obesity, unspecified: Secondary | ICD-10-CM | POA: Insufficient documentation

## 2013-10-31 DIAGNOSIS — Z8719 Personal history of other diseases of the digestive system: Secondary | ICD-10-CM | POA: Diagnosis not present

## 2013-10-31 DIAGNOSIS — Z87442 Personal history of urinary calculi: Secondary | ICD-10-CM | POA: Diagnosis not present

## 2013-10-31 DIAGNOSIS — R0789 Other chest pain: Secondary | ICD-10-CM | POA: Diagnosis not present

## 2013-10-31 DIAGNOSIS — R1013 Epigastric pain: Secondary | ICD-10-CM | POA: Insufficient documentation

## 2013-10-31 DIAGNOSIS — G8929 Other chronic pain: Secondary | ICD-10-CM | POA: Diagnosis not present

## 2013-10-31 LAB — BASIC METABOLIC PANEL
ANION GAP: 12 (ref 5–15)
BUN: 17 mg/dL (ref 6–23)
CALCIUM: 9.2 mg/dL (ref 8.4–10.5)
CHLORIDE: 108 meq/L (ref 96–112)
CO2: 23 meq/L (ref 19–32)
CREATININE: 0.69 mg/dL (ref 0.50–1.10)
GFR calc Af Amer: >90 mL/min (ref >90)
GFR calc non Af Amer: >90 mL/min (ref >90)
GLUCOSE: 102 mg/dL — AB (ref 70–99)
Potassium: 4.3 mEq/L (ref 3.7–5.3)
Sodium: 143 mEq/L (ref 137–147)

## 2013-10-31 LAB — CBC WITH DIFFERENTIAL/PLATELET
Basophils Absolute: 0 10*3/uL (ref 0.0–0.1)
Basophils Relative: 1 % (ref 0–1)
EOS PCT: 3 % (ref 0–5)
Eosinophils Absolute: 0.2 10*3/uL (ref 0.0–0.7)
HEMATOCRIT: 39 % (ref 36.0–46.0)
HEMOGLOBIN: 13.5 g/dL (ref 12.0–15.0)
LYMPHS ABS: 2.6 10*3/uL (ref 0.7–4.0)
LYMPHS PCT: 49 % — AB (ref 12–46)
MCH: 30.8 pg (ref 26.0–34.0)
MCHC: 34.6 g/dL (ref 30.0–36.0)
MCV: 88.8 fL (ref 78.0–100.0)
MONO ABS: 0.4 10*3/uL (ref 0.1–1.0)
MONOS PCT: 7 % (ref 3–12)
NEUTROS ABS: 2.1 10*3/uL (ref 1.7–7.7)
Neutrophils Relative %: 40 % — ABNORMAL LOW (ref 43–77)
Platelets: 221 10*3/uL (ref 150–400)
RBC: 4.39 MIL/uL (ref 3.87–5.11)
RDW: 12.1 % (ref 11.5–15.5)
WBC: 5.3 10*3/uL (ref 4.0–10.5)

## 2013-10-31 LAB — HEPATIC FUNCTION PANEL
ALBUMIN: 4.1 g/dL (ref 3.5–5.2)
ALT: 12 U/L (ref 0–35)
AST: 21 U/L (ref 0–37)
Alkaline Phosphatase: 76 U/L (ref 39–117)
BILIRUBIN TOTAL: 0.2 mg/dL — AB (ref 0.3–1.2)
Total Protein: 7.5 g/dL (ref 6.0–8.3)

## 2013-10-31 LAB — LIPASE, BLOOD: Lipase: 39 U/L (ref 11–59)

## 2013-10-31 LAB — TROPONIN I: Troponin I: <0.3 ng/mL (ref <0.30)

## 2013-10-31 MED ORDER — KETOROLAC TROMETHAMINE 30 MG/ML IJ SOLN
30.0000 mg | Freq: Once | INTRAMUSCULAR | Status: AC
  Administered 2013-10-31: 30 mg via INTRAVENOUS
  Filled 2013-10-31: qty 1

## 2013-10-31 NOTE — ED Notes (Signed)
Pt c.o left sided chest pain x one week that has gotten progressively worse; pt states she has had some sob

## 2013-10-31 NOTE — ED Provider Notes (Signed)
CSN: [id number]     Arrival date & time 10/31/13  2017 History  This chart was scribed for Ward Givens, MD by Modena Jansky, ED Scribe. This patient was seen in room APA09/APA09 and the patient's care was started at 8:30 PM.   Chief Complaint  Patient presents with  . Chest Pain   The history is provided by the patient. No language interpreter was used.   HPI Comments: Erica Graves is a 48 y.o. female who presents to the Emergency Department complaining of constant moderate substernal and right sided chest pain that started a week ago. She reports that the pain has progressively gotten worse. She states that the pain lasts all day. She currently rate the pain as a 5/10 and 8/10 at its worst. She states that she had a episode of 8/10 pain today. She reports no modifying factors to make it worse or better.  She describes the pain as a "fluttering" sensation in her chest. However she did have a sharp pain today that lasted about a second like something stabbing her. She states that she has some SOB with ambulation. She reports some nausea and a "stuck" sensation when she eats food then her food slowly goes down that has been going on for 2 weeks. She denies any hx of GERD or heartburn symptoms. 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  She denies any cough, fever, diaphoresis, or emesis.   Patient is being evaluated by Dr. Darrick Penna for constipation in the past. She denies having prior DVT.  Family history negative for coronary artery disease although her mother has a low heart rate   PCP Dr Gerda Diss GI Doctor- Darrick Penna    Past Medical History  Diagnosis Date  . Obesity   . Chronic low back pain   . Depression with anxiety   . Constipation   . Kidney stone    Past Surgical History  Procedure Laterality Date  . Left knee arthroscopy  08/14/2005 Dr. Romeo Apple torn meniscus  . Knee  arthroscopy    . Partial hysterectomy    . Lumbar spine surgery    . Back surgery    . Knee arthroscopy  05/21/2011    Procedure: ARTHROSCOPY KNEE;  Surgeon: Vickki Hearing, MD;  Location: [facility];  Service: Orthopedics;  Laterality: Left;  lateral menisectomy  . Abdominal hysterectomy    . Total knee arthroplasty  03/31/2012    Procedure: TOTAL KNEE ARTHROPLASTY;  Surgeon: Vickki Hearing, MD;  Location: [facility];  Service: Orthopedics;  Laterality: Left;  Left Total Knee Arthroplasty  . Knee closed reduction Left 05/26/2012    Procedure: CLOSED MANIPULATION KNEE;  Surgeon: Vickki Hearing, MD;  Location: [facility];  Service: Orthopedics;  Laterality: Left;  . Colonoscopy N/A 06/29/2013    Procedure: COLONOSCOPY;  Surgeon: West Bali, MD;  Location: [facility];  Service: Endoscopy;  Laterality: N/A;  1:30   Family History  Problem Relation Age of Onset  . Colon cancer Neg Hx    History  Substance Use Topics  . Smoking status: Never Smoker   . Smokeless tobacco: Not on file  . Alcohol Use: No   Employed Lives at home Lives with spouse  OB History   Grav Para Term Preterm Abortions TAB SAB Ect Mult Living  Review of Systems  Constitutional: Negative for fever and diaphoresis.  Respiratory: Positive for shortness of breath. Negative for cough.   Cardiovascular: Positive for chest pain.  Gastrointestinal: Positive for nausea. Negative for vomiting.  All other systems reviewed and are negative.   Allergies  Review of patient's allergies indicates no known allergies.  Home Medications   Prior to Admission medications   Medication Sig Start Date End Date Taking? Authorizing Provider  clobetasol cream (TEMOVATE) 0.05 % Apply 1 application topically 2 (two) times daily. 09/24/13   Campbell Riches, NP  hydrochlorothiazide (HYDRODIURIL) 25 MG tablet One po q am prn swelling 09/24/13   Campbell Riches, NP  Vitamin D, Ergocalciferol, (DRISDOL) 50000 UNITS  CAPS capsule Take 1 capsule (50,000 Units total) by mouth every 7 (seven) days. 10/14/13   Campbell Riches, NP   BP 152/92  Pulse 73  Temp(Src) 98.1 F (36.7 C)  Resp 20  Ht  (1.676 m)  Wt 200 lb (90.719 kg)  BMI 32.30 kg/m2  SpO2 99%  Vital signs normal except hypertension  Physical Exam  Nursing note and vitals reviewed. Constitutional: She is oriented to person, place, and time. She appears well-developed and well-nourished.  Non-toxic appearance. She does not appear ill. No distress.  HENT:  Head: Normocephalic and atraumatic.  Right Ear: External ear normal.  Left Ear: External ear normal.  Nose: Nose normal. No mucosal edema or rhinorrhea.  Mouth/Throat: Oropharynx is clear and moist and mucous membranes are normal. No dental abscesses or uvula swelling.  Eyes: Conjunctivae and EOM are normal. Pupils are equal, round, and reactive to light.  Neck: Normal range of motion and full passive range of motion without pain. Neck supple.  Cardiovascular: Normal rate, regular rhythm and normal heart sounds.  Exam reveals no gallop and no friction rub.   No murmur heard. Pulmonary/Chest: Effort normal and breath sounds normal. No respiratory distress. She has no wheezes. She has no rhonchi. She has no rales. She exhibits no tenderness and no crepitus.  Abdominal: Soft. Normal appearance and bowel sounds are normal. She exhibits no distension. There is tenderness. There is no rebound and no guarding.  Epigastric tenderness.   Musculoskeletal: Normal range of motion. She exhibits no edema and no tenderness.  Moves all extremities well.   Neurological: She is alert and oriented to person, place, and time. She has normal strength. No cranial nerve deficit.  Skin: Skin is warm, dry and intact. No rash noted. No erythema. No pallor.  Psychiatric: She has a normal mood and affect. Her speech is normal and behavior is normal. Her mood appears not anxious.    ED Course  Procedures  (including critical care time)  Medications  ketorolac (TORADOL) 30 MG/ML injection 30 mg (30 mg Intravenous Given 10/31/13 2127)    DIAGNOSTIC STUDIES: Oxygen Saturation is 99% on RA, normal by my interpretation.    COORDINATION OF CARE: 8:34 PM- Pt advised of plan for treatment which includes medication, radiology, and labs and pt agrees.  Pt states her pain is gone after the toradol. Discuss thinking about getting a cardiac stress test, however she doesn't have many risk factors. PT describes a lot of stress between her work and caring for her elderly family members.  Pt has seen Dr Darrick Penna in the past and she should f/u on her dysphagia symptoms.   Labs Review Results for orders placed during the hospital encounter of 10/31/13  CBC WITH DIFFERENTIAL      Result  Value Ref Range   WBC 5.3  4.0 - 10.5 K/uL   RBC 4.39  3.87 - 5.11 MIL/uL   Hemoglobin 13.5  12.0 - 15.0 g/dL   HCT 16.1  09.6 - 04.5 %   MCV 88.8  78.0 - 100.0 fL   MCH 30.8  26.0 - 34.0 pg   MCHC 34.6  30.0 - 36.0 g/dL   RDW 40.9  81.1 - 91.4 %   Platelets 221  150 - 400 K/uL   Neutrophils Relative % 40 (*) 43 - 77 %   Neutro Abs 2.1  1.7 - 7.7 K/uL   Lymphocytes Relative 49 (*) 12 - 46 %   Lymphs Abs 2.6  0.7 - 4.0 K/uL   Monocytes Relative 7  3 - 12 %   Monocytes Absolute 0.4  0.1 - 1.0 K/uL   Eosinophils Relative 3  0 - 5 %   Eosinophils Absolute 0.2  0.0 - 0.7 K/uL   Basophils Relative 1  0 - 1 %   Basophils Absolute 0.0  0.0 - 0.1 K/uL  BASIC METABOLIC PANEL      Result Value Ref Range   Sodium 143  137 - 147 mEq/L   Potassium 4.3  3.7 - 5.3 mEq/L   Chloride 108  96 - 112 mEq/L   CO2 23  19 - 32 mEq/L   Glucose, Bld 102 (*) 70 - 99 mg/dL   BUN 17  6 - 23 mg/dL   Creatinine, Ser 7.82  0.50 - 1.10 mg/dL   Calcium 9.2  8.4 - 95.6 mg/dL   GFR calc non Af Amer >90  >90 mL/min   GFR calc Af Amer >90  >90 mL/min   Anion gap 12  5 - 15  TROPONIN I      Result Value Ref Range   Troponin I <0.30  <0.30 ng/mL   HEPATIC FUNCTION PANEL      Result Value Ref Range   Total Protein 7.5  6.0 - 8.3 g/dL   Albumin 4.1  3.5 - 5.2 g/dL   AST 21  0 - 37 U/L   ALT 12  0 - 35 U/L   Alkaline Phosphatase 76  39 - 117 U/L   Total Bilirubin 0.2 (*) 0.3 - 1.2 mg/dL   Bilirubin, Direct <2.1  0.0 - 0.3 mg/dL   Indirect Bilirubin NOT CALCULATED  0.3 - 0.9 mg/dL  LIPASE, BLOOD      Result Value Ref Range   Lipase 39  11 - 59 U/L   Laboratory interpretation all normal      Imaging Review Dg Chest Portable 1 View  10/31/2013   CLINICAL DATA:  Chest pain and shortness of breath for 2 days.  EXAM: PORTABLE CHEST - 1 VIEW  COMPARISON:  04/07/2006  FINDINGS: The heart size and mediastinal contours are within normal limits. Both lungs are clear. The visualized skeletal structures are unremarkable.  IMPRESSION: No active disease.   Electronically Signed   By: Burman Nieves M.D.   On: 10/31/2013 21:17     EKG Interpretation   Date/Time:  Saturday October 31 2013 20:29:42 EDT Ventricular Rate:  65 PR Interval:  160 QRS Duration: 90 QT Interval:  396 QTC Calculation: 412 R Axis:   8 Text Interpretation:  Sinus rhythm Ventricular premature complex Baseline  wander Incomplete right bundle branch block No old tracing to compare  Confirmed by Marnette Perkins  MD-I, Merari Pion ([id number]) on 10/31/2013 9:09:54 PM  MDM   Final diagnoses:  Atypical chest pain  Dysphagia    I personally performed the services described in this documentation, which was scribed in my presence. The recorded information has been reviewed and considered.  Devoria Albe, MD, Armando Gang     Ward Givens, MD 10/31/13 [phone]

## 2013-10-31 NOTE — Discharge Instructions (Signed)
Try ice packs and heat to your chest for pain relief. Take ibuprofen 600 mg 4 times a day for pain. Call Dr Evelina Dun office to discuss your difficulty swallowing food. Return to the ED if you get a fever, struggle to breathe or your pain gets worse. Chew your food well and drink a lot of liquids to help the food pass more easily. Recheck if your food gets food stuck.

## 2013-11-12 ENCOUNTER — Ambulatory Visit (INDEPENDENT_AMBULATORY_CARE_PROVIDER_SITE_OTHER): Payer: BC Managed Care – PPO | Admitting: Nurse Practitioner

## 2013-11-12 ENCOUNTER — Encounter: Payer: Self-pay | Admitting: Nurse Practitioner

## 2013-11-12 VITALS — BP 118/74 | Ht 66.0 in | Wt 213.0 lb

## 2013-11-12 DIAGNOSIS — M545 Low back pain, unspecified: Secondary | ICD-10-CM

## 2013-11-12 MED ORDER — CYCLOBENZAPRINE HCL 10 MG PO TABS: 10.0000 mg | ORAL_TABLET | Freq: Every day | ORAL | Status: DC

## 2013-11-12 MED ORDER — NAPROXEN 500 MG PO TABS: 500.0000 mg | ORAL_TABLET | Freq: Two times a day (BID) | ORAL | Status: DC

## 2013-11-12 NOTE — Patient Instructions (Signed)
Icy Hot Smart Relief TENS unit  Ice or heat applications Back Exercises Back exercises help treat and prevent back injuries. The goal of back exercises is to increase the strength of your abdominal and back muscles and the flexibility of your back. These exercises should be started when you no longer have back pain. Back exercises include:  Pelvic Tilt. Lie on your back with your knees bent. Tilt your pelvis until the lower part of your back is against the floor. Hold this position 5 to 10 sec and repeat 5 to 10 times.  Knee to Chest. Pull first 1 knee up against your chest and hold for 20 to 30 seconds, repeat this with the other knee, and then both knees. This may be done with the other leg straight or bent, whichever feels better.  Sit-Ups or Curl-Ups. Bend your knees 90 degrees. Start with tilting your pelvis, and do a partial, slow sit-up, lifting your trunk only 30 to 45 degrees off the floor. Take at least 2 to 3 seconds for each sit-up. Do not do sit-ups with your knees out straight. If partial sit-ups are difficult, simply do the above but with only tightening your abdominal muscles and holding it as directed.  Hip-Lift. Lie on your back with your knees flexed 90 degrees. Push down with your feet and shoulders as you raise your hips a couple inches off the floor; hold for 10 seconds, repeat 5 to 10 times.  Back arches. Lie on your stomach, propping yourself up on bent elbows. Slowly press on your hands, causing an arch in your low back. Repeat 3 to 5 times. Any initial stiffness and discomfort should lessen with repetition over time.  Shoulder-Lifts. Lie face down with arms beside your body. Keep hips and torso pressed to floor as you slowly lift your head and shoulders off the floor. Do not overdo your exercises, especially in the beginning. Exercises may cause you some mild back discomfort which lasts for a few minutes; however, if the pain is more severe, or lasts for more than 15  minutes, do not continue exercises until you see your caregiver. Improvement with exercise therapy for back problems is slow.  See your caregivers for assistance with developing a proper back exercise program. Document Released: 03/22/2004 Document Revised: 05/07/2011 Document Reviewed: 12/14/2010 [facility] Patient Information 2015 [place], [place]. This information is not intended to replace advice given to you by your health care provider. Make sure you discuss any questions you have with your health care provider.

## 2013-11-16 ENCOUNTER — Encounter: Payer: Self-pay | Admitting: Nurse Practitioner

## 2013-11-16 NOTE — Progress Notes (Signed)
Subjective:   Presents for complaints of off-and-on back pain that began in August. No specific history of injury but lifts 10-20 pounds at work in [employer]. Pain occurs about every day. Pain in the left low back area with a tight feeling, will radiate down the buttock back of the leg to the knee. No weakness or numbness of the left leg. No change in bowel or bladder habits. Worse with bending lifting prolonged standing or sitting. Better with ibuprofen. Also has a back massager which has helped.  Objective:   BP 118/74  Ht  (1.676 m)  Wt 213 lb (96.616 kg)  BMI 34.40 kg/m2 NAD. Alert, oriented. Lungs clear. Heart regular rate rhythm. No spinal tenderness. Minimal tenderness in the right low back area. Tight tender muscles noted in the lower thoracic/lumbar area into the left mid buttock. SLR negative on the right, slightly positive on the left. Reflexes normal limit lower extremities. Gait normal limit.  Assessment: Left-sided low back pain without sciatica  mild neuropathic pain  Plan:  Meds ordered this encounter  Medications  . naproxen (NAPROSYN) 500 MG tablet    Sig: Take 1 tablet (500 mg total) by mouth 2 (two) times daily with a meal.    Dispense:  30 tablet    Refill:  0    Order Specific Question:  Supervising Provider    Answer:  Merlyn Albert [[id number]]  . cyclobenzaprine (FLEXERIL) 10 MG tablet    Sig: Take 1 tablet (10 mg total) by mouth at bedtime. Prn muscle spasms    Dispense:  30 tablet    Refill:  0    Order Specific Question:  Supervising Provider    Answer:  Merlyn Albert [[id number]]  . clobetasol cream (TEMOVATE) 0.05 %    Sig:    Ice/heat applications. OTC TENS unit. Continue back massager. Warning signs reviewed. Call back in 7-10 days if no improvement, sooner if worse. Discussed good body mechanics. Back exercises. Avoid any heavy lifting for the next week.

## 2013-12-22 ENCOUNTER — Ambulatory Visit (INDEPENDENT_AMBULATORY_CARE_PROVIDER_SITE_OTHER): Payer: BC Managed Care – PPO | Admitting: Orthopedic Surgery

## 2013-12-22 ENCOUNTER — Encounter: Payer: Self-pay | Admitting: Orthopedic Surgery

## 2013-12-22 VITALS — BP 145/91 | Ht 66.0 in | Wt 213.0 lb

## 2013-12-22 DIAGNOSIS — G5602 Carpal tunnel syndrome, left upper limb: Secondary | ICD-10-CM

## 2013-12-22 DIAGNOSIS — G5601 Carpal tunnel syndrome, right upper limb: Secondary | ICD-10-CM | POA: Insufficient documentation

## 2013-12-22 DIAGNOSIS — G5603 Carpal tunnel syndrome, bilateral upper limbs: Secondary | ICD-10-CM

## 2013-12-22 NOTE — Progress Notes (Signed)
Patient ID: Clyda HurdleJanice M Crepeau, female   DOB: 1966/02/20, 48 y.o.   MRN: [id number]  Chief Complaint  Patient presents with  . Follow-up    Recheck bilateral CTS with injections. Last injections were on 06/15.    carpal tunnel syndrome status post previous injections with good relief request bilateral injections of the carpal tunnel  .Carpal Tunnel Injection Procedure Note  Pre-operative Diagnosis: right and left carpal tunnel syndrome  Post-operative Diagnosis: same  Indications: failure of conservative therapy  Procedure Details right  Verbal consent for the procedure was obtained. After a sterile alcohol prep, the skin and subcutaneous layer overlying the carpal tunnel were anesthetized with Ethyl Chloride.  A needle was advanced into the carpal tunnel space, carefully avoiding the nerve.  Free flow into the carpal tunnel space was confirmed.  The tunnel was then injected with 1 ml of depomedrol. The area was cleansed with isopropyl alcohol and a dressing was applied.  Complications:  None; patient tolerated the procedure well.  Repeated left carpal tunnel injection

## 2014-03-04 ENCOUNTER — Encounter: Payer: Self-pay | Admitting: Nurse Practitioner

## 2014-03-04 ENCOUNTER — Ambulatory Visit (INDEPENDENT_AMBULATORY_CARE_PROVIDER_SITE_OTHER): Payer: BC Managed Care – PPO | Admitting: Nurse Practitioner

## 2014-03-04 VITALS — BP 122/88 | Temp 98.1°F | Ht 66.0 in | Wt 216.1 lb

## 2014-03-04 DIAGNOSIS — M545 Low back pain: Secondary | ICD-10-CM

## 2014-03-04 DIAGNOSIS — S39012D Strain of muscle, fascia and tendon of lower back, subsequent encounter: Secondary | ICD-10-CM

## 2014-03-04 MED ORDER — METHOCARBAMOL 750 MG PO TABS: 750.0000 mg | ORAL_TABLET | Freq: Three times a day (TID) | ORAL | Status: DC | PRN

## 2014-03-04 MED ORDER — PHENTERMINE HCL 37.5 MG PO TABS: 37.5000 mg | ORAL_TABLET | Freq: Every day | ORAL | Status: DC

## 2014-03-04 NOTE — Patient Instructions (Signed)
Tdap and flu vaccine

## 2014-03-05 ENCOUNTER — Encounter: Payer: Self-pay | Admitting: Nurse Practitioner

## 2014-03-05 NOTE — Progress Notes (Signed)
Subjective:  Presents for recheck on her low back pain see previous note 11/12/2013. Symptoms are fine when she first gets up in the morning. Worse with bending and lifting. Now goes across the back on both sides. Will occasionally radiate into the legs. No numbness or weakness. Has been doing her back exercises. When she is off work and is not lifting is much her symptoms improved. No change in bowel or bladder habits. Has had knee replacement left side.  Objective:   BP 122/88 mmHg  Temp(Src) 98.1 F (36.7 C) (Oral)  Ht 5\' 6"  (1.676 m)  Wt 216 lb 2 oz (98.034 kg)  BMI 34.90 kg/m2 NAD. Alert, oriented. Mild generalized lumbar tenderness. SLR neg bilat. Reflexes nl lower extremities.   Assessment: Low back strain, subsequent encounter  Morbid obesity   Plan:  Meds ordered this encounter  Medications  . phentermine (ADIPEX-P) 37.5 MG tablet    Sig: Take 1 tablet (37.5 mg total) by mouth daily before breakfast.    Dispense:  30 tablet    Refill:  0    Order Specific Question:  Supervising Provider    Answer:  Merlyn AlbertLUKING, WILLIAM S [[id number]]  . methocarbamol (ROBAXIN) 750 MG tablet    Sig: Take 1 tablet (750 mg total) by mouth every 8 (eight) hours as needed for muscle spasms.    Dispense:  30 tablet    Refill:  0    Order Specific Question:  Supervising Provider    Answer:  Merlyn AlbertLUKING, WILLIAM S [[id number]]   Recommend weight loss. Reviewed potential adverse effects of phentermine; DC med and call if any problems. Increase exercise as tolerated. Avoid heavy lifting. Reviewed proper body mechanics. Add TENS unit to regimen. Continue ice/heat applications and stretching exercises.  Return in about 1 month (around 04/04/2014).

## 2014-03-07 ENCOUNTER — Encounter: Payer: Self-pay | Admitting: Nurse Practitioner

## 2014-03-25 ENCOUNTER — Ambulatory Visit: Payer: BC Managed Care – PPO | Admitting: Orthopedic Surgery

## 2014-04-01 ENCOUNTER — Ambulatory Visit (INDEPENDENT_AMBULATORY_CARE_PROVIDER_SITE_OTHER): Payer: BC Managed Care – PPO | Admitting: Nurse Practitioner

## 2014-04-01 ENCOUNTER — Encounter: Payer: Self-pay | Admitting: Nurse Practitioner

## 2014-04-01 VITALS — BP 132/84 | Ht 66.0 in | Wt 209.6 lb

## 2014-04-01 DIAGNOSIS — R300 Dysuria: Secondary | ICD-10-CM

## 2014-04-01 LAB — POCT URINALYSIS DIPSTICK
PH UA: 6
SPEC GRAV UA: 1.015

## 2014-04-01 LAB — POCT UA - MICROSCOPIC ONLY
BACTERIA, U MICROSCOPIC: POSITIVE
RBC, urine, microscopic: NEGATIVE

## 2014-04-01 MED ORDER — PHENTERMINE HCL 37.5 MG PO TABS: 37.5000 mg | ORAL_TABLET | Freq: Every day | ORAL | Status: DC

## 2014-04-01 MED ORDER — NITROFURANTOIN MONOHYD MACRO 100 MG PO CAPS: 100.0000 mg | ORAL_CAPSULE | Freq: Two times a day (BID) | ORAL | Status: DC

## 2014-04-01 NOTE — Progress Notes (Signed)
Subjective:  Presents for recheck on her weight. Taking phentermine without difficulty. Sleeping well at nighttime. No chest pain palpitations or shortness of breath. Has noticed some discomfort at the end of urination, unsure when this began but at least over a week ago. No fever. No urgency or frequency. No incontinence. No back or flank pain. No vaginal discharge. No pelvic pain.  Objective:   BP 132/84 mmHg  Ht 5\' 6"  (1.676 m)  Wt 209 lb 9.6 oz (95.074 kg)  BMI 33.85 kg/m2 NAD. Alert, oriented. Lungs clear. Heart regular rate rhythm. Abdomen soft nondistended nontender. Results for orders placed or performed in visit on 04/01/14  POCT urinalysis dipstick  Result Value Ref Range   Color, UA     Clarity, UA     Glucose, UA     Bilirubin, UA     Ketones, UA     Spec Grav, UA 1.015    Blood, UA     pH, UA 6.0    Protein, UA     Urobilinogen, UA     Nitrite, UA     Leukocytes, UA small (1+)   POCT UA - Microscopic Only  Result Value Ref Range   WBC, Ur, HPF, POC 2-5    RBC, urine, microscopic neg    Bacteria, U Microscopic pos    Mucus, UA     Epithelial cells, urine per micros occas    Crystals, Ur, HPF, POC     Casts, Ur, LPF, POC     Yeast, UA rare      Assessment:  Problem List Items Addressed This Visit      Other   Morbid obesity   Relevant Medications   phentermine (ADIPEX-P) 37.5 MG tablet    Other Visit Diagnoses    Dysuria    -  Primary possible UTI     Relevant Orders    POCT urinalysis dipstick (Completed)      Plan: Continue phentermine as directed. Encouraged healthy diet and regular exercise. Recheck in 3 months if she wishes to continue. Meds ordered this encounter  Medications  . phentermine (ADIPEX-P) 37.5 MG tablet    Sig: Take 1 tablet (37.5 mg total) by mouth daily before breakfast.    Dispense:  30 tablet    Refill:  2    Order Specific Question:  Supervising Provider    Answer:  Merlyn AlbertLUKING, WILLIAM S [[id number]]  . nitrofurantoin,  macrocrystal-monohydrate, (MACROBID) 100 MG capsule    Sig: Take 1 capsule (100 mg total) by mouth 2 (two) times daily.    Dispense:  10 capsule    Refill:  0    Order Specific Question:  Supervising Provider    Answer:  Merlyn AlbertLUKING, WILLIAM S [[id number]]   AZO as directed for 48 hours then discontinue. Warning signs reviewed regarding urinary symptoms. Call back in 4 days if no improvement, sooner if worse.

## 2014-04-02 ENCOUNTER — Ambulatory Visit: Payer: BC Managed Care – PPO | Admitting: Nurse Practitioner

## 2014-04-20 ENCOUNTER — Ambulatory Visit (INDEPENDENT_AMBULATORY_CARE_PROVIDER_SITE_OTHER): Payer: BC Managed Care – PPO | Admitting: Orthopedic Surgery

## 2014-04-20 ENCOUNTER — Ambulatory Visit (INDEPENDENT_AMBULATORY_CARE_PROVIDER_SITE_OTHER): Payer: BC Managed Care – PPO

## 2014-04-20 VITALS — BP 132/85 | Ht 66.0 in | Wt 209.0 lb

## 2014-04-20 DIAGNOSIS — M1710 Unilateral primary osteoarthritis, unspecified knee: Secondary | ICD-10-CM

## 2014-04-20 DIAGNOSIS — M129 Arthropathy, unspecified: Secondary | ICD-10-CM

## 2014-04-20 DIAGNOSIS — Z96652 Presence of left artificial knee joint: Secondary | ICD-10-CM

## 2014-04-20 NOTE — Progress Notes (Signed)
Patient ID: Erica Graves, female   DOB: 21-Feb-1966, 49 y.o.   MRN: [id number]  Chief Complaint  Patient presents with  . Follow-up    Yearly recheck on left knee replacement with xray, DOS 03-31-12.    HPI Erica Graves is a 49 y.o. female. Status left total knee arthroplasty. The patient is doing well the left hip implant functioning well Review of Systems Review of Systems  Carpal tunnel symptoms are stable   Past Medical History  Diagnosis Date  . Obesity   . Chronic low back pain   . Depression with anxiety   . Constipation   . Kidney stone     Past Surgical History  Procedure Laterality Date  . Left knee arthroscopy  08/14/2005 Dr. Romeo AppleHarrison torn meniscus  . Knee arthroscopy    . Partial hysterectomy    . Lumbar spine surgery    . Back surgery    . Knee arthroscopy  05/21/2011    Procedure: ARTHROSCOPY KNEE;  Surgeon: Vickki HearingStanley E Harrison, MD;  Location: [facility];  Service: Orthopedics;  Laterality: Left;  lateral menisectomy  . Abdominal hysterectomy    . Total knee arthroplasty  03/31/2012    Procedure: TOTAL KNEE ARTHROPLASTY;  Surgeon: Vickki HearingStanley E Harrison, MD;  Location: [facility];  Service: Orthopedics;  Laterality: Left;  Left Total Knee Arthroplasty  . Knee closed reduction Left 05/26/2012    Procedure: CLOSED MANIPULATION KNEE;  Surgeon: Vickki HearingStanley E Harrison, MD;  Location: [facility];  Service: Orthopedics;  Laterality: Left;  . Colonoscopy N/A 06/29/2013    Procedure: COLONOSCOPY;  Surgeon: West BaliSandi L Fields, MD;  Location: [facility];  Service: Endoscopy;  Laterality: N/A;  1:30     No Known Allergies  Current Outpatient Prescriptions  Medication Sig Dispense Refill  . cyclobenzaprine (FLEXERIL) 10 MG tablet Take 1 tablet (10 mg total) by mouth at bedtime. Prn muscle spasms 30 tablet 0  . methocarbamol (ROBAXIN) 750 MG tablet Take 1 tablet (750 mg total) by mouth every 8 (eight) hours as needed for muscle spasms. 30 tablet 0  . nitrofurantoin, macrocrystal-monohydrate,  (MACROBID) 100 MG capsule Take 1 capsule (100 mg total) by mouth 2 (two) times daily. 10 capsule 0  . phentermine (ADIPEX-P) 37.5 MG tablet Take 1 tablet (37.5 mg total) by mouth daily before breakfast. 30 tablet 2   No current facility-administered medications for this visit.      Physical Exam Blood pressure 132/85, height 5\' 6"  (1.676 m), weight 209 lb (94.802 kg).  Overall appearance normal grooming and hygiene. The patient is awake alert and oriented 3 with a pleasant mood and affect. The patient is ambulatory without assistive device and without limping.  . Skin incision is clean dry and intact. Muscle tone is normal knee is stable with full range of motion of 225   Data Reviewed Today's imaging shows stable total knee without loosening or complication  Assessment    Doing well    Plan    Follow-up in a year repeat x-ray left       Erica Graves 04/20/2014, 4:15 PM

## 2014-05-12 ENCOUNTER — Emergency Department (HOSPITAL_COMMUNITY)
Admission: EM | Admit: 2014-05-12 | Discharge: 2014-05-12 | Disposition: A | Discharge status: Home / Self Care | Payer: BC Managed Care – PPO | Attending: Emergency Medicine | Admitting: Emergency Medicine

## 2014-05-12 ENCOUNTER — Encounter (HOSPITAL_COMMUNITY): Payer: Self-pay | Admitting: *Deleted

## 2014-05-12 DIAGNOSIS — R5383 Other fatigue: Secondary | ICD-10-CM | POA: Insufficient documentation

## 2014-05-12 DIAGNOSIS — G8929 Other chronic pain: Secondary | ICD-10-CM | POA: Diagnosis not present

## 2014-05-12 DIAGNOSIS — N390 Urinary tract infection, site not specified: Secondary | ICD-10-CM | POA: Diagnosis not present

## 2014-05-12 DIAGNOSIS — Z79899 Other long term (current) drug therapy: Secondary | ICD-10-CM | POA: Diagnosis not present

## 2014-05-12 DIAGNOSIS — Z87442 Personal history of urinary calculi: Secondary | ICD-10-CM | POA: Insufficient documentation

## 2014-05-12 DIAGNOSIS — Z8719 Personal history of other diseases of the digestive system: Secondary | ICD-10-CM | POA: Insufficient documentation

## 2014-05-12 DIAGNOSIS — E669 Obesity, unspecified: Secondary | ICD-10-CM | POA: Diagnosis not present

## 2014-05-12 DIAGNOSIS — Z8659 Personal history of other mental and behavioral disorders: Secondary | ICD-10-CM | POA: Insufficient documentation

## 2014-05-12 DIAGNOSIS — R300 Dysuria: Secondary | ICD-10-CM | POA: Diagnosis present

## 2014-05-12 LAB — URINALYSIS, ROUTINE W REFLEX MICROSCOPIC
Bilirubin Urine: NEGATIVE
Glucose, UA: NEGATIVE mg/dL
Ketones, ur: NEGATIVE mg/dL
Nitrite: NEGATIVE
Protein, ur: NEGATIVE mg/dL
Specific Gravity, Urine: >1.03 — ABNORMAL HIGH (ref 1.005–1.030)
Urobilinogen, UA: 0.2 mg/dL (ref 0.0–1.0)
pH: 5.5 (ref 5.0–8.0)

## 2014-05-12 LAB — URINE MICROSCOPIC-ADD ON

## 2014-05-12 MED ORDER — SULFAMETHOXAZOLE-TRIMETHOPRIM 800-160 MG PO TABS: 1.0000 | ORAL_TABLET | Freq: Two times a day (BID) | ORAL | Status: DC

## 2014-05-12 NOTE — ED Notes (Signed)
Patient with no complaints at this time. Respirations even and unlabored. Skin warm/dry. Discharge instructions reviewed with patient at this time. Patient given opportunity to voice concerns/ask questions. Patient discharged at this time and left Emergency Department with steady gait.   

## 2014-05-12 NOTE — ED Provider Notes (Addendum)
CSN: [id number]     Arrival date & time 05/12/14  1405 History   First MD Initiated Contact with Patient 05/12/14 1416     Chief Complaint  Patient presents with  . Dysuria     (Consider location/radiation/quality/duration/timing/severity/associated sxs/prior Treatment) Patient is a 49 y.o. female presenting with dysuria. The history is provided by the patient.  Dysuria Associated symptoms: flank pain and nausea   Associated symptoms: no abdominal pain and no vomiting    patient's had some urinary frequency for the last couple days. Was treated around a month and a half ago for urinary tract infection through her primary care doctor. Was given Macrobid at that time. The symptoms started back. States his been increased pain in her left flank also. States her urine seems a little darker. No fevers. She's had slight nausea without vomiting. No abdominal pain. She states she feels little fatigue.  Past Medical History  Diagnosis Date  . Obesity   . Chronic low back pain   . Depression with anxiety   . Constipation   . Kidney stone    Past Surgical History  Procedure Laterality Date  . Left knee arthroscopy  08/14/2005 Dr. Romeo AppleHarrison torn meniscus  . Knee arthroscopy    . Partial hysterectomy    . Lumbar spine surgery    . Back surgery    . Knee arthroscopy  05/21/2011    Procedure: ARTHROSCOPY KNEE;  Surgeon: Vickki HearingStanley E Harrison, MD;  Location: [facility];  Service: Orthopedics;  Laterality: Left;  lateral menisectomy  . Abdominal hysterectomy    . Total knee arthroplasty  03/31/2012    Procedure: TOTAL KNEE ARTHROPLASTY;  Surgeon: Vickki HearingStanley E Harrison, MD;  Location: [facility];  Service: Orthopedics;  Laterality: Left;  Left Total Knee Arthroplasty  . Knee closed reduction Left 05/26/2012    Procedure: CLOSED MANIPULATION KNEE;  Surgeon: Vickki HearingStanley E Harrison, MD;  Location: [facility];  Service: Orthopedics;  Laterality: Left;  . Colonoscopy N/A 06/29/2013    Procedure: COLONOSCOPY;  Surgeon: West BaliSandi L  Fields, MD;  Location: [facility];  Service: Endoscopy;  Laterality: N/A;  1:30   Family History  Problem Relation Age of Onset  . Colon cancer Neg Hx    History  Substance Use Topics  . Smoking status: Never Smoker   . Smokeless tobacco: Not on file  . Alcohol Use: No   OB History    No data available     Review of Systems  Constitutional: Positive for fatigue. Negative for activity change and appetite change.  Respiratory: Negative for chest tightness and shortness of breath.   Cardiovascular: Negative for chest pain and leg swelling.  Gastrointestinal: Positive for nausea. Negative for vomiting, abdominal pain and diarrhea.  Genitourinary: Positive for dysuria and flank pain.  Musculoskeletal: Positive for back pain.  Skin: Negative for rash.  Neurological: Negative for weakness, numbness and headaches.      Allergies  Review of patient's allergies indicates no known allergies.  Home Medications   Prior to Admission medications   Medication Sig Start Date End Date Taking? Authorizing Provider  cyclobenzaprine (FLEXERIL) 10 MG tablet Take 1 tablet (10 mg total) by mouth at bedtime. Prn muscle spasms 11/12/13   Campbell Richesarolyn C Hoskins, NP  methocarbamol (ROBAXIN) 750 MG tablet Take 1 tablet (750 mg total) by mouth every 8 (eight) hours as needed for muscle spasms. 03/04/14   Campbell Richesarolyn C Hoskins, NP  nitrofurantoin, macrocrystal-monohydrate, (MACROBID) 100 MG capsule Take 1 capsule (100 mg total) by  mouth 2 (two) times daily. 04/01/14   Campbell Riches, NP  phentermine (ADIPEX-P) 37.5 MG tablet Take 1 tablet (37.5 mg total) by mouth daily before breakfast. 04/01/14   Campbell Riches, NP  sulfamethoxazole-trimethoprim (BACTRIM DS,SEPTRA DS) 800-160 MG per tablet Take 1 tablet by mouth 2 (two) times daily. 05/12/14   Benjiman Core, MD   BP 139/80 mmHg  Pulse 74  Temp(Src) 98.6 F (37 C) (Oral)  Resp 18  Ht  (1.676 m)  Wt 195 lb (88.451 kg)  BMI 31.49 kg/m2  SpO2  100% Physical Exam  Constitutional: She is oriented to person, place, and time. She appears well-developed and well-nourished.  Patient is well-appearing  HENT:  Head: Normocephalic and atraumatic.  Cardiovascular: Normal rate and regular rhythm.   Pulmonary/Chest: Effort normal and breath sounds normal.  Abdominal: Soft. Bowel sounds are normal. She exhibits no distension. There is no tenderness. There is no rebound and no guarding.  Genitourinary:  Mild CVA tenderness on left.  Musculoskeletal: Normal range of motion.  Neurological: She is alert and oriented to person, place, and time. No cranial nerve deficit.  Skin: Skin is warm and dry.  Psychiatric: She has a normal mood and affect. Her speech is normal.  Nursing note and vitals reviewed.   ED Course  Procedures (including critical care time) Labs Review Labs Reviewed  URINALYSIS, ROUTINE W REFLEX MICROSCOPIC - Abnormal; Notable for the following:    APPearance HAZY (*)    Specific Gravity, Urine >1.030 (*)    Hgb urine dipstick TRACE (*)    Leukocytes, UA MODERATE (*)    All other components within normal limits  URINE MICROSCOPIC-ADD ON - Abnormal; Notable for the following:    Squamous Epithelial / LPF MANY (*)    Bacteria, UA FEW (*)    All other components within normal limits  URINE CULTURE    Imaging Review No results found.   EKG Interpretation None      MDM   Final diagnoses:  Urinary tract infection without hematuria, site unspecified    Patient with dysuria and frequency. Recent urinary tract infection. Urinalysis pending at this time.    Benjiman Core, MD 05/12/14 1511  Urinalysis shows UTI. Will treat with 7 day course of Bactrim. Urine culture sent. Patient is well-appearing. She does appear somewhat concentrated urine, but she is tolerating orals.  Benjiman Core, MD 05/12/14 [phone]

## 2014-05-12 NOTE — ED Notes (Signed)
Rt flank pain,  Nausea, no vomiting,  Recent tx  With antibiotic for UTI, Today urine is "brown"

## 2014-05-12 NOTE — Discharge Instructions (Signed)

## 2014-05-13 LAB — URINE CULTURE

## 2014-06-02 ENCOUNTER — Ambulatory Visit: Payer: BC Managed Care – PPO | Admitting: Orthopedic Surgery

## 2014-06-22 ENCOUNTER — Ambulatory Visit (INDEPENDENT_AMBULATORY_CARE_PROVIDER_SITE_OTHER): Payer: BC Managed Care – PPO | Admitting: Orthopedic Surgery

## 2014-06-22 VITALS — BP 127/79 | Ht 66.0 in | Wt 195.0 lb

## 2014-06-22 DIAGNOSIS — G5602 Carpal tunnel syndrome, left upper limb: Secondary | ICD-10-CM

## 2014-06-22 DIAGNOSIS — G5601 Carpal tunnel syndrome, right upper limb: Secondary | ICD-10-CM

## 2014-06-22 DIAGNOSIS — G5603 Carpal tunnel syndrome, bilateral upper limbs: Secondary | ICD-10-CM

## 2014-06-22 MED ORDER — GABAPENTIN 100 MG PO CAPS: 100.0000 mg | ORAL_CAPSULE | Freq: Every day | ORAL | Status: DC

## 2014-06-22 NOTE — Progress Notes (Signed)
Chief Complaint  Patient presents with  . Follow-up    3 month follow up bilateral cts    The patient requests bilateral carpal tunnel injections and is considering surgery sometime in August  Her night pain is increased on decided to put her on gabapentin 100 mg daily at bedtime  Site confirmation was performed verbal consent was given for bilateral carpal tunnel injection A steroid injection was performed at right carpal tunnel using 1% plain Lidocaine and 40 mg of Depo-Medrol. This was well tolerated. This was repeated in the left carpal tunnel

## 2014-06-24 ENCOUNTER — Ambulatory Visit: Payer: BC Managed Care – PPO | Admitting: Orthopedic Surgery

## 2014-07-07 ENCOUNTER — Encounter: Payer: Self-pay | Admitting: Adult Health

## 2014-07-07 ENCOUNTER — Ambulatory Visit (INDEPENDENT_AMBULATORY_CARE_PROVIDER_SITE_OTHER): Payer: BC Managed Care – PPO | Admitting: Adult Health

## 2014-07-07 VITALS — BP 140/86 | HR 64 | Ht 64.0 in | Wt 194.0 lb

## 2014-07-07 DIAGNOSIS — Z780 Asymptomatic menopausal state: Secondary | ICD-10-CM | POA: Insufficient documentation

## 2014-07-07 DIAGNOSIS — R4589 Other symptoms and signs involving emotional state: Secondary | ICD-10-CM | POA: Insufficient documentation

## 2014-07-07 DIAGNOSIS — Z7189 Other specified counseling: Secondary | ICD-10-CM

## 2014-07-07 DIAGNOSIS — Z01419 Encounter for gynecological examination (general) (routine) without abnormal findings: Secondary | ICD-10-CM | POA: Diagnosis not present

## 2014-07-07 DIAGNOSIS — Z1212 Encounter for screening for malignant neoplasm of rectum: Secondary | ICD-10-CM | POA: Diagnosis not present

## 2014-07-07 DIAGNOSIS — R232 Flushing: Secondary | ICD-10-CM

## 2014-07-07 DIAGNOSIS — IMO0002 Reserved for concepts with insufficient information to code with codable children: Secondary | ICD-10-CM | POA: Insufficient documentation

## 2014-07-07 HISTORY — DX: Other symptoms and signs involving emotional state: R45.89

## 2014-07-07 HISTORY — DX: Reserved for concepts with insufficient information to code with codable children: IMO0002

## 2014-07-07 HISTORY — DX: Flushing: R23.2

## 2014-07-07 HISTORY — DX: Asymptomatic menopausal state: Z78.0

## 2014-07-07 HISTORY — DX: Other specified counseling: Z71.89

## 2014-07-07 LAB — HEMOCCULT GUIAC POC 1CARD (OFFICE): Fecal Occult Blood, POC: NEGATIVE

## 2014-07-07 MED ORDER — ESTRADIOL 1 MG PO TABS: 1.0000 mg | ORAL_TABLET | Freq: Every day | ORAL | Status: DC

## 2014-07-07 NOTE — Patient Instructions (Signed)
Estradiol tablets What is this medicine? ESTRADIOL (es tra DYE ole) is an estrogen. It is mostly used as hormone replacement in menopausal women. It helps to treat hot flashes and prevent osteoporosis. It is also used to treat women with low estrogen levels or those who have had their ovaries removed. This medicine may be used for other purposes; ask your health care provider or pharmacist if you have questions. COMMON BRAND NAME(S): Estrace, Femtrace, Gynodiol What should I tell my health care provider before I take this medicine? They need to know if you have or ever had any of these conditions: -abnormal vaginal bleeding -blood vessel disease or blood clots -breast, cervical, endometrial, ovarian, liver, or uterine cancer -dementia -diabetes -gallbladder disease -heart disease or recent heart attack -high blood pressure -high cholesterol -high level of calcium in the blood -hysterectomy -kidney disease -liver disease -migraine headaches -protein C deficiency -protein S deficiency -stroke -systemic lupus erythematosus (SLE) -tobacco smoker -an unusual or allergic reaction to estrogens, other hormones, medicines, foods, dyes, or preservatives -pregnant or trying to get pregnant -breast-feeding How should I use this medicine? Take this medicine by mouth. To reduce nausea, this medicine may be taken with food. Follow the directions on the prescription label. Take this medicine at the same time each day and in the order directed on the package. Do not take your medicine more often than directed. Contact your pediatrician regarding the use of this medicine in children. Special care may be needed. A patient package insert for the product will be given with each prescription and refill. Read this sheet carefully each time. The sheet may change frequently. Overdosage: If you think you have taken too much of this medicine contact a poison control center or emergency room at once. NOTE:  This medicine is only for you. Do not share this medicine with others. What if I miss a dose? If you miss a dose, take it as soon as you can. If it is almost time for your next dose, take only that dose. Do not take double or extra doses. What may interact with this medicine? Do not take this medicine with any of the following medications: -aromatase inhibitors like aminoglutethimide, anastrozole, exemestane, letrozole, testolactone This medicine may also interact with the following medications: -carbamazepine -certain antibiotics used to treat infections -certain barbiturates or benzodiazepines used for inducing sleep or treating seizures -grapefruit juice -medicines for fungus infections like itraconazole and ketoconazole -raloxifene or tamoxifen -rifabutin, rifampin, or rifapentine -ritonavir -St. John's Wort -warfarin This list may not describe all possible interactions. Give your health care provider a list of all the medicines, herbs, non-prescription drugs, or dietary supplements you use. Also tell them if you smoke, drink alcohol, or use illegal drugs. Some items may interact with your medicine. What should I watch for while using this medicine? Visit your doctor or health care professional for regular checks on your progress. You will need a regular breast and pelvic exam and Pap smear while on this medicine. You should also discuss the need for regular mammograms with your health care professional, and follow his or her guidelines for these tests. This medicine can make your body retain fluid, making your fingers, hands, or ankles swell. Your blood pressure can go up. Contact your doctor or health care professional if you feel you are retaining fluid. If you have any reason to think you are pregnant, stop taking this medicine right away and contact your doctor or health care professional. Smoking increases the risk of  getting a blood clot or having a stroke while you are taking this  medicine, especially if you are more than 51 years old. You are strongly advised not to smoke. If you wear contact lenses and notice visual changes, or if the lenses begin to feel uncomfortable, consult your eye doctor or health care professional. This medicine can increase the risk of developing a condition (endometrial hyperplasia) that may lead to cancer of the lining of the uterus. Taking progestins, another hormone drug, with this medicine lowers the risk of developing this condition. Therefore, if your uterus has not been removed (by a hysterectomy), your doctor may prescribe a progestin for you to take together with your estrogen. You should know, however, that taking estrogens with progestins may have additional health risks. You should discuss the use of estrogens and progestins with your health care professional to determine the benefits and risks for you. If you are going to have surgery, you may need to stop taking this medicine. Consult your health care professional for advice before you schedule the surgery. What side effects may I notice from receiving this medicine? Side effects that you should report to your doctor or health care professional as soon as possible: -allergic reactions like skin rash, itching or hives, swelling of the face, lips, or tongue -breast tissue changes or discharge -changes in vision -chest pain -confusion, trouble speaking or understanding -dark urine -general ill feeling or flu-like symptoms -light-colored stools -nausea, vomiting -pain, swelling, warmth in the leg -right upper belly pain -severe headaches -shortness of breath -sudden numbness or weakness of the face, arm or leg -trouble walking, dizziness, loss of balance or coordination -unusual vaginal bleeding -yellowing of the eyes or skin Side effects that usually do not require medical attention (report to your doctor or health care professional if they continue or are bothersome): -hair  loss -increased hunger or thirst -increased urination -symptoms of vaginal infection like itching, irritation or unusual discharge -unusually weak or tired This list may not describe all possible side effects. Call your doctor for medical advice about side effects. You may report side effects to FDA at 1-800-FDA-1088. Where should I keep my medicine? Keep out of the reach of children. Store at room temperature between 20 and 25 degrees C (68 and 77 degrees F). Keep container tightly closed. Protect from light. Throw away any unused medicine after the expiration date. NOTE: This sheet is a summary. It may not cover all possible information. If you have questions about this medicine, talk to your doctor, pharmacist, or health care provider.  2015, Elsevier/Gold Standard. (2010-05-17 09:19:24) Menopause Menopause is the normal time of life when menstrual periods stop completely. Menopause is complete when you have missed 12 consecutive menstrual periods. It usually occurs between the ages of 48 years and 55 years. Very rarely does a woman develop menopause before the age of 40 years. At menopause, your ovaries stop producing the female hormones estrogen and progesterone. This can cause undesirable symptoms and also affect your health. Sometimes the symptoms may occur 4-5 years before the menopause begins. There is no relationship between menopause and:  Oral contraceptives.  Number of children you had.  Race.  The age your menstrual periods started (menarche). Heavy smokers and very thin women may develop menopause earlier in life. CAUSES  The ovaries stop producing the female hormones estrogen and progesterone.  Other causes include:  Surgery to remove both ovaries.  The ovaries stop functioning for no known reason.  Tumors of the  pituitary gland in the brain.  Medical disease that affects the ovaries and hormone production.  Radiation treatment to the abdomen or  pelvis.  Chemotherapy that affects the ovaries. SYMPTOMS   Hot flashes.  Night sweats.  Decrease in sex drive.  Vaginal dryness and thinning of the vagina causing painful intercourse.  Dryness of the skin and developing wrinkles.  Headaches.  Tiredness.  Irritability.  Memory problems.  Weight gain.  Bladder infections.  Hair growth of the face and chest.  Infertility. More serious symptoms include:  Loss of bone (osteoporosis) causing breaks (fractures).  Depression.  Hardening and narrowing of the arteries (atherosclerosis) causing heart attacks and strokes. DIAGNOSIS   When the menstrual periods have stopped for 12 straight months.  Physical exam.  Hormone studies of the blood. TREATMENT  There are many treatment choices and nearly as many questions about them. The decisions to treat or not to treat menopausal changes is an individual choice made with your health care provider. Your health care provider can discuss the treatments with you. Together, you can decide which treatment will work best for you. Your treatment choices may include:   Hormone therapy (estrogen and progesterone).  Non-hormonal medicines.  Treating the individual symptoms with medicine (for example antidepressants for depression).  Herbal medicines that may help specific symptoms.  Counseling by a psychiatrist or psychologist.  Group therapy.  Lifestyle changes including:  Eating healthy.  Regular exercise.  Limiting caffeine and alcohol.  Stress management and meditation.  No treatment. HOME CARE INSTRUCTIONS   Take the medicine your health care provider gives you as directed.  Get plenty of sleep and rest.  Exercise regularly.  Eat a diet that contains calcium (good for the bones) and soy products (acts like estrogen hormone).  Avoid alcoholic beverages.  Do not smoke.  If you have hot flashes, dress in layers.  Take supplements, calcium, and vitamin D to  strengthen bones.  You can use over-the-counter lubricants or moisturizers for vaginal dryness.  Group therapy is sometimes very helpful.  Acupuncture may be helpful in some cases. SEEK MEDICAL CARE IF:   You are not sure you are in menopause.  You are having menopausal symptoms and need advice and treatment.  You are still having menstrual periods after age 51 years.  You have pain with intercourse.  Menopause is complete (no menstrual period for 12 months) and you develop vaginal bleeding.  You need a referral to a specialist (gynecologist, psychiatrist, or psychologist) for treatment. SEEK IMMEDIATE MEDICAL CARE IF:   You have severe depression.  You have excessive vaginal bleeding.  You fell and think you have a broken bone.  You have pain when you urinate.  You develop leg or chest pain.  You have a fast pounding heart beat (palpitations).  You have severe headaches.  You develop vision problems.  You feel a lump in your breast.  You have abdominal pain or severe indigestion. Document Released: 05/05/2003 Document Revised: 10/15/2012 Document Reviewed: 09/11/2012 [facility] Patient Information 2015 [employer] [place]. This information is not intended to replace advice given to you by your health care provider. Make sure you discuss any questions you have with your health care provider. Try estrace daily  Follow up with me in 3 months Physical in 1 year Mammogram yearly Colonoscopy in 2025 Try luvena  Try astro glide for sex

## 2014-07-07 NOTE — Progress Notes (Signed)
Patient ID: Erica HurdleJanice M Graves, female   DOB: 1965-09-01, 49 y.o.   MRN: [id number] History of Present Illness: Erica NixonJanice is a 49 year old black female,married in for well woman gyn exam she is sp hysterectomy.She is complaining of hot flashes and dyspareunia and moodiness.She says sex hurts. She had colonoscopy last year. PCP Bobette Mo Hoskins FNP.  Current Medications, Allergies, Past Medical History, Past Surgical History, Family History and Social History were reviewed [facility].     Review of Systems: Patient denies any headaches, hearing loss, fatigue, blurred vision, shortness of breath, chest pain, abdominal pain, problems with bowel movements(has constipation and takes linzess), urination.  No joint pain.See HPI for positives.    Physical Exam:BP 140/86 mmHg  Pulse 64  Ht 5\' 4"  (1.626 m)  Wt 194 lb (87.998 kg)  BMI 33.28 kg/m2 General:  Well developed, well nourished, no acute distress Skin:  Warm and dry Neck:  Midline trachea, normal thyroid, good ROM, no lymphadenopathy Lungs; Clear to auscultation bilaterally Breast:  No dominant palpable mass, retraction, or nipple discharge Cardiovascular: Regular rate and rhythm Abdomen:  Soft, non tender, no hepatosplenomegaly, has umbilical hernia Pelvic:  External genitalia is normal in appearance, no lesions.  The vagina has decreased color, moisture and rugae. Urethra has no lesions or masses. The cervix and uterus are absent. No adnexal masses or tenderness noted.Bladder is non tender, no masses felt. Rectal: Good sphincter tone, no polyps, or hemorrhoids felt.  Hemoccult negative. Extremities/musculoskeletal:  No swelling or varicosities noted, no clubbing or cyanosis Psych:  No mood changes, alert and cooperative,seems happy Discussed ET and SSRIs, the risk and benefits and will try ET.Also will try luvena and astroglide  Discussed if hernia is ever painful or not reducible go to ER.  Impression: Well woman  gyn exam no pap Hot flashes Dyspareunia  Moody Menopause  ET    Plan: Rx estrace 1 mg #30 1 daily with 11 refills Review handout on menopause and estrace Follow up in 3 months Physical in 1 year Mammogram yearly Colonoscopy 2025 Labs with PCP

## 2014-08-27 ENCOUNTER — Ambulatory Visit: Payer: BC Managed Care – PPO | Admitting: Podiatry

## 2014-09-13 ENCOUNTER — Ambulatory Visit (INDEPENDENT_AMBULATORY_CARE_PROVIDER_SITE_OTHER): Payer: BC Managed Care – PPO | Admitting: Orthopedic Surgery

## 2014-09-13 ENCOUNTER — Encounter: Payer: Self-pay | Admitting: Orthopedic Surgery

## 2014-09-13 VITALS — BP 131/86 | Ht 64.0 in | Wt 194.0 lb

## 2014-09-13 DIAGNOSIS — G5601 Carpal tunnel syndrome, right upper limb: Secondary | ICD-10-CM

## 2014-09-13 DIAGNOSIS — G5603 Carpal tunnel syndrome, bilateral upper limbs: Secondary | ICD-10-CM

## 2014-09-13 DIAGNOSIS — G5602 Carpal tunnel syndrome, left upper limb: Secondary | ICD-10-CM | POA: Diagnosis not present

## 2014-09-13 NOTE — Progress Notes (Signed)
Chief Complaint  Patient presents with  . Follow-up    Follow up for bilateral CTS.    Primary symptom today is left carpal tunnel syndrome with weakness and she is dropping things along with the pain and paresthesias in the carpal tunnel median nerve distribution status post multiple injections over the last 2-3 years. After discussion with she and I both decided that she should go ahead and have the carpal tunnel release on the left secondary to the worsening weakness  Review of systems is negative  Past Medical History  Diagnosis Date  . Obesity   . Chronic low back pain   . Depression with anxiety   . Constipation   . Kidney stone   . Carpal tunnel syndrome, bilateral   . Hot flashes 07/07/2014  . Dyspareunia 07/07/2014  . Moody 07/07/2014  . Menopause 07/07/2014  . Umbilical hernia   . Counseling for estrogen replacement therapy 07/07/2014   Past Surgical History  Procedure Laterality Date  . Left knee arthroscopy  08/14/2005 Dr. Romeo AppleHarrison torn meniscus  . Knee arthroscopy    . Partial hysterectomy    . Lumbar spine surgery    . Back surgery    . Knee arthroscopy  05/21/2011    Procedure: ARTHROSCOPY KNEE;  Surgeon: Vickki HearingStanley E Candy Leverett, MD;  Location: [facility];  Service: Orthopedics;  Laterality: Left;  lateral menisectomy  . Abdominal hysterectomy    . Total knee arthroplasty  03/31/2012    Procedure: TOTAL KNEE ARTHROPLASTY;  Surgeon: Vickki HearingStanley E Champion Corales, MD;  Location: [facility];  Service: Orthopedics;  Laterality: Left;  Left Total Knee Arthroplasty  . Knee closed reduction Left 05/26/2012    Procedure: CLOSED MANIPULATION KNEE;  Surgeon: Vickki HearingStanley E Corynn Solberg, MD;  Location: [facility];  Service: Orthopedics;  Laterality: Left;  . Colonoscopy N/A 06/29/2013    Procedure: COLONOSCOPY;  Surgeon: West BaliSandi L Fields, MD;  Location: [facility];  Service: Endoscopy;  Laterality: N/A;  1:30   Family History  Problem Relation Age of Onset  . Colon cancer Neg Hx   . Heart disease Mother   .  Hypertension Mother    History  Substance Use Topics  . Smoking status: Never Smoker   . Smokeless tobacco: Never Used  . Alcohol Use: Yes     Comment: occ    BP 131/86 mmHg  Ht 5\' 4"  (1.626 m)  Wt 194 lb (87.998 kg)  BMI 33.28 kg/m2 This is well-developed well-nourished female grooming and hygiene are intact she is alert and oriented 3 mood and affect are normal gait and station are without abnormality  Her hand has no visible abnormality she has full range of motion of the wrist and small digits of the fingers. Thumb is normal in terms of range of motion as well  She started have some tenderness in the wrist carpal tunnel itself is nontender wrist is stable motor exam is normal skin is intact pulses are good capillary refill is normal epitrochlear lymph nodes are negative and she has the decreased sensation in the thumb index and long fingers partial ring as well  Intercarpal tunnel syndrome  She would like to have a left carpal tunnel release

## 2014-09-13 NOTE — Patient Instructions (Addendum)
SURGERY AUGUST 4- LEFT CARPAL TUNNEL RELEASE  Carpal Tunnel Release Carpal tunnel release is done to relieve the pressure on the nerves and tendons on the bottom side of your wrist.  LET YOUR CAREGIVER KNOW ABOUT:   Allergies to food or medicine.  Medicines taken, including vitamins, herbs, eyedrops, over-the-counter medicines, and creams.  Use of steroids (by mouth or creams).  Previous problems with anesthetics or numbing medicines.  History of bleeding problems or blood clots.  Previous surgery.  Other health problems, including diabetes and kidney problems.  Possibility of pregnancy, if this applies. RISKS AND COMPLICATIONS  Some problems that may happen after this procedure include:  Infection.  Damage to the nerves, arteries or tendons could occur. This would be very uncommon.  Bleeding. BEFORE THE PROCEDURE   This surgery may be done while you are asleep (general anesthetic) or may be done under a block where only your forearm and the surgical area is numb.  If the surgery is done under a block, the numbness will gradually wear off within several hours after surgery. HOME CARE INSTRUCTIONS   Have a responsible person with you for 24 hours.  Do not drive a car or use public transportation for 24 hours.  Only take over-the-counter or prescription medicines for pain, discomfort, or fever as directed by your caregiver. Take them as directed.  You may put ice on the palm side of the affected wrist.  Put ice in a plastic bag.  Place a towel between your skin and the bag.  Leave the ice on for 20 to 30 minutes, 4 times per day.  If you were given a splint to keep your wrist from bending, use it as directed. It is important to wear the splint at night or as directed. Use the splint for as long as you have pain or numbness in your hand, arm, or wrist. This may take 1 to 2 months.  Keep your hand raised (elevated) above the level of your heart as much as possible.  This keeps swelling down and helps with discomfort.  Change bandages (dressings) as directed.  Keep the wound clean and dry. SEEK MEDICAL CARE IF:   You develop pain not relieved with medications.  You develop numbness of your hand.  You develop bleeding from your surgical site.  You have an oral temperature above 102 F (38.9 C).  You develop redness or swelling of the surgical site.  You develop new, unexplained problems. SEEK IMMEDIATE MEDICAL CARE IF:   You develop a rash.  You have difficulty breathing.  You develop any reaction or side effects to medications given. Document Released: 05/05/2003 Document Revised: 05/07/2011 Document Reviewed: 12/19/2006 [facility] Patient Information 2015 [place], [place]. This information is not intended to replace advice given to you by your health care provider. Make sure you discuss any questions you have with your health care provider.

## 2014-09-16 ENCOUNTER — Encounter (HOSPITAL_COMMUNITY): Payer: Self-pay | Admitting: Emergency Medicine

## 2014-09-16 ENCOUNTER — Emergency Department (HOSPITAL_COMMUNITY): Payer: Worker's Compensation

## 2014-09-16 ENCOUNTER — Emergency Department (HOSPITAL_COMMUNITY)
Admission: EM | Admit: 2014-09-16 | Discharge: 2014-09-16 | Disposition: A | Discharge status: Home / Self Care | Payer: Worker's Compensation | Attending: Emergency Medicine | Admitting: Emergency Medicine

## 2014-09-16 DIAGNOSIS — Z87442 Personal history of urinary calculi: Secondary | ICD-10-CM | POA: Diagnosis not present

## 2014-09-16 DIAGNOSIS — W01198A Fall on same level from slipping, tripping and stumbling with subsequent striking against other object, initial encounter: Secondary | ICD-10-CM | POA: Insufficient documentation

## 2014-09-16 DIAGNOSIS — Y9389 Activity, other specified: Secondary | ICD-10-CM | POA: Insufficient documentation

## 2014-09-16 DIAGNOSIS — S5001XA Contusion of right elbow, initial encounter: Secondary | ICD-10-CM | POA: Diagnosis not present

## 2014-09-16 DIAGNOSIS — S59901A Unspecified injury of right elbow, initial encounter: Secondary | ICD-10-CM | POA: Diagnosis present

## 2014-09-16 DIAGNOSIS — E669 Obesity, unspecified: Secondary | ICD-10-CM | POA: Insufficient documentation

## 2014-09-16 DIAGNOSIS — Y9289 Other specified places as the place of occurrence of the external cause: Secondary | ICD-10-CM | POA: Diagnosis not present

## 2014-09-16 DIAGNOSIS — Z87448 Personal history of other diseases of urinary system: Secondary | ICD-10-CM | POA: Insufficient documentation

## 2014-09-16 DIAGNOSIS — G8929 Other chronic pain: Secondary | ICD-10-CM | POA: Diagnosis not present

## 2014-09-16 DIAGNOSIS — Y990 Civilian activity done for income or pay: Secondary | ICD-10-CM | POA: Diagnosis not present

## 2014-09-16 DIAGNOSIS — Z8659 Personal history of other mental and behavioral disorders: Secondary | ICD-10-CM | POA: Diagnosis not present

## 2014-09-16 DIAGNOSIS — R52 Pain, unspecified: Secondary | ICD-10-CM

## 2014-09-16 MED ORDER — HYDROCODONE-ACETAMINOPHEN 5-325 MG PO TABS: ORAL_TABLET | ORAL | Status: DC

## 2014-09-16 NOTE — ED Notes (Signed)
Pt reports slipped and fell at work. Pt reports right arm pain. Moderate ecchymosis noted to right arm. No deformity noted.

## 2014-09-16 NOTE — Discharge Instructions (Signed)
Contusion °A contusion is a deep bruise. Contusions happen when an injury causes bleeding under the skin. Signs of bruising include pain, puffiness (swelling), and discolored skin. The contusion may turn blue, purple, or yellow. °HOME CARE  °· Put ice on the injured area. °¨ Put ice in a plastic bag. °¨ Place a towel between your skin and the bag. °¨ Leave the ice on for 15-20 minutes, 03-04 times a day. °· Only take medicine as told by your doctor. °· Rest the injured area. °· If possible, raise (elevate) the injured area to lessen puffiness. °GET HELP RIGHT AWAY IF:  °· You have more bruising or puffiness. °· You have pain that is getting worse. °· Your puffiness or pain is not helped by medicine. °MAKE SURE YOU:  °· Understand these instructions. °· Will watch your condition. °· Will get help right away if you are not doing well or get worse. °Document Released: 08/01/2007 Document Revised: 05/07/2011 Document Reviewed: 12/18/2010 °ExitCare® Patient Information ©2015 [employer], LLC. This information is not intended to replace advice given to you by your health care provider. Make sure you discuss any questions you have with your health care provider. ° °

## 2014-09-17 NOTE — ED Provider Notes (Signed)
CSN: [id number]     Arrival date & time 09/16/14  1133 History   First MD Initiated Contact with Patient 09/16/14 1218     Chief Complaint  Patient presents with  . Fall     (Consider location/radiation/quality/duration/timing/severity/associated sxs/prior Treatment) HPI   Erica Graves is a 49 y.o. female who presents to the Emergency Department complaining of right elbow pain and bruising after a direct blow the elbow.  Pain is reproduced with movement and improves with rest.  She also noticed mild swelling.  She states this is a work related injury and occurred just prior to ED arrival.  She denies numbness or weakness of the extremity, shoulder or wrist pain.  She has not tried any therapies for pain relief.     Past Medical History  Diagnosis Date  . Obesity   . Chronic low back pain   . Depression with anxiety   . Constipation   . Kidney stone   . Carpal tunnel syndrome, bilateral   . Hot flashes 07/07/2014  . Dyspareunia 07/07/2014  . Moody 07/07/2014  . Menopause 07/07/2014  . Umbilical hernia   . Counseling for estrogen replacement therapy 07/07/2014   Past Surgical History  Procedure Laterality Date  . Left knee arthroscopy  08/14/2005 Dr. Romeo Apple torn meniscus  . Knee arthroscopy    . Partial hysterectomy    . Lumbar spine surgery    . Back surgery    . Knee arthroscopy  05/21/2011    Procedure: ARTHROSCOPY KNEE;  Surgeon: Vickki Hearing, MD;  Location: [facility];  Service: Orthopedics;  Laterality: Left;  lateral menisectomy  . Abdominal hysterectomy    . Total knee arthroplasty  03/31/2012    Procedure: TOTAL KNEE ARTHROPLASTY;  Surgeon: Vickki Hearing, MD;  Location: [facility];  Service: Orthopedics;  Laterality: Left;  Left Total Knee Arthroplasty  . Knee closed reduction Left 05/26/2012    Procedure: CLOSED MANIPULATION KNEE;  Surgeon: Vickki Hearing, MD;  Location: [facility];  Service: Orthopedics;  Laterality: Left;  . Colonoscopy N/A 06/29/2013    Procedure:  COLONOSCOPY;  Surgeon: West Bali, MD;  Location: [facility];  Service: Endoscopy;  Laterality: N/A;  1:30   Family History  Problem Relation Age of Onset  . Colon cancer Neg Hx   . Heart disease Mother   . Hypertension Mother    History  Substance Use Topics  . Smoking status: Never Smoker   . Smokeless tobacco: Never Used  . Alcohol Use: Yes     Comment: occ   OB History    Gravida Para Term Preterm AB TAB SAB Ectopic Multiple Living   3 3             Review of Systems  Constitutional: Negative for fever and chills.  Genitourinary: Negative for dysuria and difficulty urinating.  Musculoskeletal: Positive for arthralgias (right elbow pain). Negative for joint swelling.  Skin: Negative for color change and wound.  All other systems reviewed and are negative.     Allergies  Review of patient's allergies indicates no known allergies.  Home Medications   Prior to Admission medications   Medication Sig Start Date End Date Taking? Authorizing Provider  estradiol (ESTRACE) 1 MG tablet Take 1 tablet (1 mg total) by mouth daily. Patient not taking: Reported on 09/16/2014 07/07/14   Adline Potter, NP  gabapentin (NEURONTIN) 100 MG capsule Take 1 capsule (100 mg total) by mouth at bedtime. Patient not taking: Reported  on 09/16/2014 06/22/14   Vickki Hearing, MD  HYDROcodone-acetaminophen (NORCO/VICODIN) 5-325 MG per tablet Take one-two tabs po q 4-6 hrs prn pain 09/16/14   Carleton Vanvalkenburgh, PA-C   BP 130/76 mmHg  Pulse 74  Temp(Src) 98 F (36.7 C) (Oral)  Resp 18  Ht  (1.651 m)  Wt 180 lb (81.647 kg)  BMI 29.95 kg/m2  SpO2 100% Physical Exam  Constitutional: She is oriented to person, place, and time. She appears well-developed and well-nourished. No distress.  HENT:  Head: Normocephalic and atraumatic.  Cardiovascular: Normal rate, regular rhythm, normal heart sounds and intact distal pulses.   Pulmonary/Chest: Effort normal and breath sounds normal. No  respiratory distress. She exhibits no tenderness.  Musculoskeletal: She exhibits edema and tenderness.  Right elbow is tender at the lateral epicondyle.  mild ecchymosis proximally.  Radial pulse is brisk, distal sensation intact.  CR< 2 sec.  No  bony deformity.  Patient has full ROM. Compartments soft.  Neurological: She is alert and oriented to person, place, and time. She exhibits normal muscle tone. Coordination normal.  Skin: Skin is warm and dry.  Nursing note and vitals reviewed.   ED Course  Procedures (including critical care time) Labs Review Labs Reviewed - No data to display  Imaging Review Dg Elbow 2 Views Right  09/16/2014   CLINICAL DATA:  Pain following fall  EXAM: RIGHT ELBOW - 2 VIEW  COMPARISON:  None.  FINDINGS: Frontal and lateral views were obtained. There is no fracture or dislocation. No joint effusion apparent. Joint spaces appear intact. No erosive change.  IMPRESSION: No fracture or dislocation.  No apparent arthropathy.   Electronically Signed   By: Bretta Bang III M.D.   On: 09/16/2014 13:04   Dg Humerus Right  09/16/2014   CLINICAL DATA:  Fall this morning with right arm pain, initial encounter  EXAM: RIGHT HUMERUS - 2+ VIEW  COMPARISON:  None.  FINDINGS: There is no evidence of fracture or other focal bone lesions. Soft tissues are unremarkable.  IMPRESSION: No acute abnormality noted.   Electronically Signed   By: Alcide Clever M.D.   On: 09/16/2014 12:10     EKG Interpretation None      MDM   Final diagnoses:  Elbow contusion, right, initial encounter    XR negative for fx.  Mild STS and ecchymosis of the lateral elbow.  Pt has full ROM and remains NV intact.  She agrees to sling for comfort, ice and orthopedic f/u in one week if needed.      Pauline Aus, PA-C 09/17/14 2246  Margarita Grizzle, MD 09/18/14 1736

## 2014-09-22 ENCOUNTER — Telehealth: Payer: Self-pay | Admitting: Orthopedic Surgery

## 2014-09-22 NOTE — Telephone Encounter (Signed)
Regarding out-patient surgery scheduled at [facility] 09/30/14, CPT [phone], contacted insurer, [facility] Plan - per Maryruth Bun, no pre-authorization is required.  Her name and today's date for reference: 09/22/14, 11:10AM

## 2014-09-23 NOTE — Patient Instructions (Signed)
Erica Graves  09/23/2014     @   Your procedure is scheduled on  09/30/2014  Report to [facility] at  615  A.M.  Call this number if you have problems the morning of surgery:  [phone]   Remember:  Do not eat food or drink liquids after midnight.  Take these medicines the morning of surgery with A SIP OF WATER  hydrocodone   Do not wear jewelry, make-up or nail polish.  Do not wear lotions, powders, or perfumes.   Do not shave 48 hours prior to surgery.  Men may shave face and neck.  Do not bring valuables to the hospital.  [facility] is not responsible for any belongings or valuables.  Contacts, dentures or bridgework may not be worn into surgery.  Leave your suitcase in the car.  After surgery it may be brought to your room.  For patients admitted to the hospital, discharge time will be determined by your treatment team.  Patients discharged the day of surgery will not be allowed to drive home.   Name and phone number of your driver:   family Special instructions:  none  Please read over the following fact sheets that you were given. Pain Booklet, Coughing and Deep Breathing, Surgical Site Infection Prevention, Anesthesia Post-op Instructions and Care and Recovery After Surgery      Carpal Tunnel Release Carpal tunnel release is done to relieve the pressure on the nerves and tendons on the bottom side of your wrist.  LET YOUR CAREGIVER KNOW ABOUT:   Allergies to food or medicine.  Medicines taken, including vitamins, herbs, eyedrops, over-the-counter medicines, and creams.  Use of steroids (by mouth or creams).  Previous problems with anesthetics or numbing medicines.  History of bleeding problems or blood clots.  Previous surgery.  Other health problems, including diabetes and kidney problems.  Possibility of pregnancy, if this applies. RISKS AND COMPLICATIONS  Some problems that may happen after this procedure  include:  Infection.  Damage to the nerves, arteries or tendons could occur. This would be very uncommon.  Bleeding. BEFORE THE PROCEDURE   This surgery may be done while you are asleep (general anesthetic) or may be done under a block where only your forearm and the surgical area is numb.  If the surgery is done under a block, the numbness will gradually wear off within several hours after surgery. HOME CARE INSTRUCTIONS   Have a responsible person with you for 24 hours.  Do not drive a car or use public transportation for 24 hours.  Only take over-the-counter or prescription medicines for pain, discomfort, or fever as directed by your caregiver. Take them as directed.  You may put ice on the palm side of the affected wrist.  Put ice in a plastic bag.  Place a towel between your skin and the bag.  Leave the ice on for 20 to 30 minutes, 4 times per day.  If you were given a splint to keep your wrist from bending, use it as directed. It is important to wear the splint at night or as directed. Use the splint for as long as you have pain or numbness in your hand, arm, or wrist. This may take 1 to 2 months.  Keep your hand raised (elevated) above the level of your heart as much as possible. This keeps swelling down and helps with discomfort.  Change bandages (dressings) as directed.  Keep the wound clean and  dry. SEEK MEDICAL CARE IF:   You develop pain not relieved with medications.  You develop numbness of your hand.  You develop bleeding from your surgical site.  You have an oral temperature above 102 F (38.9 C).  You develop redness or swelling of the surgical site.  You develop new, unexplained problems. SEEK IMMEDIATE MEDICAL CARE IF:   You develop a rash.  You have difficulty breathing.  You develop any reaction or side effects to medications given. Document Released: 05/05/2003 Document Revised: 05/07/2011 Document Reviewed: 12/19/2006 [facility] Patient  Information 2015 [place], [place]. This information is not intended to replace advice given to you by your health care provider. Make sure you discuss any questions you have with your health care provider.  PATIENT INSTRUCTIONS POST-ANESTHESIA  IMMEDIATELY FOLLOWING SURGERY:  Do not drive or operate machinery for the first twenty four hours after surgery.  Do not make any important decisions for twenty four hours after surgery or while taking narcotic pain medications or sedatives.  If you develop intractable nausea and vomiting or a severe headache please notify your doctor immediately.  FOLLOW-UP:  Please make an appointment with your surgeon as instructed. You do not need to follow up with anesthesia unless specifically instructed to do so.  WOUND CARE INSTRUCTIONS (if applicable):  Keep a dry clean dressing on the anesthesia/puncture wound site if there is drainage.  Once the wound has quit draining you may leave it open to air.  Generally you should leave the bandage intact for twenty four hours unless there is drainage.  If the epidural site drains for more than 36-48 hours please call the anesthesia department.  QUESTIONS?:  Please feel free to call your physician or the hospital operator if you have any questions, and they will be happy to assist you.

## 2014-09-24 ENCOUNTER — Encounter (HOSPITAL_COMMUNITY)
Admission: RE | Admit: 2014-09-24 | Discharge: 2014-09-24 | Disposition: A | Discharge status: Home / Self Care | Payer: BC Managed Care – PPO | Source: Ambulatory Visit | Attending: Orthopedic Surgery | Admitting: Orthopedic Surgery

## 2014-09-24 ENCOUNTER — Encounter (HOSPITAL_COMMUNITY): Payer: Self-pay

## 2014-09-24 DIAGNOSIS — G5602 Carpal tunnel syndrome, left upper limb: Secondary | ICD-10-CM | POA: Diagnosis not present

## 2014-09-24 DIAGNOSIS — Z01818 Encounter for other preprocedural examination: Secondary | ICD-10-CM | POA: Insufficient documentation

## 2014-09-24 LAB — CBC WITH DIFFERENTIAL/PLATELET
BASOS ABS: 0 10*3/uL (ref 0.0–0.1)
BASOS PCT: 1 % (ref 0–1)
Eosinophils Absolute: 0.1 10*3/uL (ref 0.0–0.7)
Eosinophils Relative: 3 % (ref 0–5)
HEMATOCRIT: 39.1 % (ref 36.0–46.0)
HEMOGLOBIN: 13 g/dL (ref 12.0–15.0)
LYMPHS PCT: 44 % (ref 12–46)
Lymphs Abs: 2.2 10*3/uL (ref 0.7–4.0)
MCH: 30 pg (ref 26.0–34.0)
MCHC: 33.2 g/dL (ref 30.0–36.0)
MCV: 90.3 fL (ref 78.0–100.0)
Monocytes Absolute: 0.5 10*3/uL (ref 0.1–1.0)
Monocytes Relative: 10 % (ref 3–12)
NEUTROS ABS: 2.2 10*3/uL (ref 1.7–7.7)
NEUTROS PCT: 44 % (ref 43–77)
Platelets: 209 10*3/uL (ref 150–400)
RBC: 4.33 MIL/uL (ref 3.87–5.11)
RDW: 12.3 % (ref 11.5–15.5)
WBC: 5.2 10*3/uL (ref 4.0–10.5)

## 2014-09-24 LAB — BASIC METABOLIC PANEL
Anion gap: 8 (ref 5–15)
BUN: 19 mg/dL (ref 6–20)
CHLORIDE: 105 mmol/L (ref 101–111)
CO2: 27 mmol/L (ref 22–32)
CREATININE: 0.8 mg/dL (ref 0.44–1.00)
Calcium: 9.2 mg/dL (ref 8.9–10.3)
GFR calc Af Amer: >60 mL/min (ref >60)
Glucose, Bld: 85 mg/dL (ref 65–99)
POTASSIUM: 3.9 mmol/L (ref 3.5–5.1)
SODIUM: 140 mmol/L (ref 135–145)

## 2014-09-24 NOTE — Pre-Procedure Instructions (Signed)
Patient given information to sign up for my chart at home. 

## 2014-09-28 NOTE — H&P (Signed)
Erica Graves is an 49 y.o. female.   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 She was initially treated with a nonoperative regimen of anti-inflammatory's and bracing along with Neurontin vitamin B 6 and several injections. However, she has noticed weakness in her hand instead of just pain and now understands as we have discussed before that she really needs surgical release. She understands the risks and benefits of this procedure which include but are not limited to recurrent carpal tunnel symptoms; tender incision.  Past Medical History  Diagnosis Date  . Obesity   . Chronic low back pain   . Depression with anxiety   . Constipation   . Kidney stone   . Carpal tunnel syndrome, bilateral   . Hot flashes 07/07/2014  . Dyspareunia 07/07/2014  . Moody 07/07/2014  . Menopause 07/07/2014  . Umbilical hernia   . Counseling for estrogen replacement therapy 07/07/2014    Past Surgical History  Procedure Laterality Date  . Left knee arthroscopy  08/14/2005 Dr. Romeo Apple torn meniscus  . Knee arthroscopy Right   . Partial hysterectomy    . Lumbar spine surgery    . Back surgery    . Knee arthroscopy  05/21/2011    Procedure: ARTHROSCOPY KNEE;  Surgeon: Vickki Hearing, MD;  Location: [facility];  Service: Orthopedics;  Laterality: Left;  lateral menisectomy  . Abdominal hysterectomy    . Total knee arthroplasty  03/31/2012    Procedure: TOTAL KNEE ARTHROPLASTY;  Surgeon: Vickki Hearing, MD;  Location: [facility];  Service: Orthopedics;  Laterality: Left;  Left Total Knee Arthroplasty  . Knee closed reduction Left 05/26/2012    Procedure: CLOSED MANIPULATION KNEE;  Surgeon: Vickki Hearing, MD;  Location: [facility];  Service: Orthopedics;  Laterality: Left;   . Colonoscopy N/A 06/29/2013    Procedure: COLONOSCOPY;  Surgeon: West Bali, MD;  Location: [facility];  Service: Endoscopy;  Laterality: N/A;  1:30    Family History  Problem Relation Age of Onset  . Colon cancer Neg Hx   . Heart disease Mother   . Hypertension Mother    Social History:  reports that she has never smoked. She has never used smokeless tobacco. She reports that she drinks alcohol. She reports that she does not use illicit drugs.  Allergies: No Known Allergies  No prescriptions prior to admission    No results found for this or any previous visit (from the past 48 hour(s)). No results found.  ROS No related review of systems at this point There were no vitals taken for this visit. Physical Exam  She is awake alert and oriented 3 mood and affect are normal her overall appearance is within normal limits her mood is pleasant her gait is normal. Her left hand is not swollen but she has tenderness over the carpal tunnel she has a positive wrist flexion test and a positive carpal tunnel tenderness tests. The wrist remain stable she has full range of motion of the hand she has bilateral carpal tunnel disease and a grip strength difference cannot be appreciated. Scans intact pulses are good capillary refill is excellent the thumb index long and part of the ring finger show decreased sensation. Lymph nodes are negative   Assessment/Plan Left carpal  tunnel syndrome  Left carpal tunnel release planned  Fuller Canada 09/28/2014, 1:42 PM

## 2014-09-30 ENCOUNTER — Ambulatory Visit (HOSPITAL_COMMUNITY): Payer: BC Managed Care – PPO | Admitting: Anesthesiology

## 2014-09-30 ENCOUNTER — Ambulatory Visit: Payer: BC Managed Care – PPO | Admitting: Podiatry

## 2014-09-30 ENCOUNTER — Ambulatory Visit (HOSPITAL_COMMUNITY)
Admission: RE | Admit: 2014-09-30 | Discharge: 2014-09-30 | Disposition: A | Discharge status: Home / Self Care | Payer: BC Managed Care – PPO | Source: Ambulatory Visit | Attending: Orthopedic Surgery | Admitting: Orthopedic Surgery

## 2014-09-30 ENCOUNTER — Encounter (HOSPITAL_COMMUNITY)
Admission: RE | Disposition: A | Discharge status: Home / Self Care | Payer: Self-pay | Source: Ambulatory Visit | Attending: Orthopedic Surgery

## 2014-09-30 ENCOUNTER — Encounter (HOSPITAL_COMMUNITY): Payer: Self-pay | Admitting: *Deleted

## 2014-09-30 DIAGNOSIS — G5602 Carpal tunnel syndrome, left upper limb: Secondary | ICD-10-CM | POA: Diagnosis not present

## 2014-09-30 DIAGNOSIS — E669 Obesity, unspecified: Secondary | ICD-10-CM | POA: Insufficient documentation

## 2014-09-30 DIAGNOSIS — Z96652 Presence of left artificial knee joint: Secondary | ICD-10-CM | POA: Diagnosis not present

## 2014-09-30 DIAGNOSIS — Z79899 Other long term (current) drug therapy: Secondary | ICD-10-CM | POA: Insufficient documentation

## 2014-09-30 DIAGNOSIS — F418 Other specified anxiety disorders: Secondary | ICD-10-CM | POA: Diagnosis not present

## 2014-09-30 DIAGNOSIS — R202 Paresthesia of skin: Secondary | ICD-10-CM | POA: Diagnosis not present

## 2014-09-30 HISTORY — PX: CARPAL TUNNEL RELEASE: SHX101

## 2014-09-30 SURGERY — CARPAL TUNNEL RELEASE
Anesthesia: Monitor Anesthesia Care | Site: Hand | Laterality: Left

## 2014-09-30 MED ORDER — ONDANSETRON HCL 4 MG/2ML IJ SOLN
INTRAMUSCULAR | Status: AC
  Filled 2014-09-30: qty 2

## 2014-09-30 MED ORDER — PROPOFOL 10 MG/ML IV BOLUS
INTRAVENOUS | Status: AC
Start: 2014-09-30 — End: 2014-09-30
  Filled 2014-09-30: qty 20

## 2014-09-30 MED ORDER — FENTANYL CITRATE (PF) 100 MCG/2ML IJ SOLN
25.0000 ug | INTRAMUSCULAR | Status: DC | PRN
  Administered 2014-09-30: 25 ug via INTRAVENOUS

## 2014-09-30 MED ORDER — BUPIVACAINE HCL (PF) 0.5 % IJ SOLN
INTRAMUSCULAR | Status: AC
  Filled 2014-09-30: qty 30

## 2014-09-30 MED ORDER — FENTANYL CITRATE (PF) 100 MCG/2ML IJ SOLN
INTRAMUSCULAR | Status: AC
Start: 2014-09-30 — End: 2014-09-30
  Filled 2014-09-30: qty 2

## 2014-09-30 MED ORDER — MIDAZOLAM HCL 2 MG/2ML IJ SOLN
INTRAMUSCULAR | Status: AC
  Filled 2014-09-30: qty 4

## 2014-09-30 MED ORDER — FENTANYL CITRATE (PF) 100 MCG/2ML IJ SOLN
25.0000 ug | INTRAMUSCULAR | Status: AC
  Administered 2014-09-30 (×2): 25 ug via INTRAVENOUS

## 2014-09-30 MED ORDER — SODIUM CHLORIDE 0.9 % IJ SOLN
INTRAMUSCULAR | Status: AC
  Filled 2014-09-30: qty 20

## 2014-09-30 MED ORDER — KETOROLAC TROMETHAMINE 30 MG/ML IJ SOLN
30.0000 mg | Freq: Once | INTRAMUSCULAR | Status: AC
  Administered 2014-09-30: 30 mg via INTRAVENOUS

## 2014-09-30 MED ORDER — MIDAZOLAM HCL 5 MG/5ML IJ SOLN
INTRAMUSCULAR | Status: DC | PRN
  Administered 2014-09-30: 2 mg via INTRAVENOUS

## 2014-09-30 MED ORDER — HYDROMORPHONE HCL 2 MG PO TABS: 2.0000 mg | ORAL_TABLET | ORAL | Status: DC | PRN

## 2014-09-30 MED ORDER — PROPOFOL INFUSION 10 MG/ML OPTIME
INTRAVENOUS | Status: DC | PRN
  Administered 2014-09-30: 25 ug/kg/min via INTRAVENOUS

## 2014-09-30 MED ORDER — BUPIVACAINE HCL (PF) 0.5 % IJ SOLN
INTRAMUSCULAR | Status: DC | PRN
  Administered 2014-09-30: 10 mL

## 2014-09-30 MED ORDER — ONDANSETRON HCL 4 MG/2ML IJ SOLN
4.0000 mg | Freq: Once | INTRAMUSCULAR | Status: AC
  Administered 2014-09-30: 4 mg via INTRAVENOUS

## 2014-09-30 MED ORDER — CHLORHEXIDINE GLUCONATE 4 % EX LIQD: 60.0000 mL | Freq: Once | CUTANEOUS | Status: DC

## 2014-09-30 MED ORDER — KETOROLAC TROMETHAMINE 30 MG/ML IJ SOLN
INTRAMUSCULAR | Status: AC
Start: 2014-09-30 — End: 2014-09-30
  Filled 2014-09-30: qty 1

## 2014-09-30 MED ORDER — DEXAMETHASONE SODIUM PHOSPHATE 4 MG/ML IJ SOLN
4.0000 mg | Freq: Once | INTRAMUSCULAR | Status: AC
  Administered 2014-09-30: 4 mg via INTRAVENOUS

## 2014-09-30 MED ORDER — SODIUM CHLORIDE 0.9 % IJ SOLN
INTRAMUSCULAR | Status: AC
  Filled 2014-09-30: qty 3

## 2014-09-30 MED ORDER — LIDOCAINE HCL (PF) 1 % IJ SOLN
INTRAMUSCULAR | Status: AC
  Filled 2014-09-30: qty 5

## 2014-09-30 MED ORDER — ONDANSETRON HCL 4 MG/2ML IJ SOLN: 4.0000 mg | Freq: Once | INTRAMUSCULAR | Status: AC | PRN

## 2014-09-30 MED ORDER — LACTATED RINGERS IV SOLN
INTRAVENOUS | Status: DC
  Administered 2014-09-30: 1000 mL via INTRAVENOUS

## 2014-09-30 MED ORDER — MIDAZOLAM HCL 2 MG/2ML IJ SOLN
INTRAMUSCULAR | Status: AC
  Filled 2014-09-30: qty 2

## 2014-09-30 MED ORDER — PROMETHAZINE HCL 12.5 MG PO TABS: 12.5000 mg | ORAL_TABLET | Freq: Four times a day (QID) | ORAL | Status: DC | PRN

## 2014-09-30 MED ORDER — CEFAZOLIN SODIUM-DEXTROSE 2-3 GM-% IV SOLR
2.0000 g | INTRAVENOUS | Status: AC
  Administered 2014-09-30: 2 g via INTRAVENOUS
  Filled 2014-09-30: qty 50

## 2014-09-30 MED ORDER — FENTANYL CITRATE (PF) 100 MCG/2ML IJ SOLN
INTRAMUSCULAR | Status: AC
  Filled 2014-09-30: qty 4

## 2014-09-30 MED ORDER — FENTANYL CITRATE (PF) 100 MCG/2ML IJ SOLN
INTRAMUSCULAR | Status: AC
  Filled 2014-09-30: qty 2

## 2014-09-30 MED ORDER — DEXAMETHASONE SODIUM PHOSPHATE 4 MG/ML IJ SOLN
INTRAMUSCULAR | Status: AC
  Filled 2014-09-30: qty 1

## 2014-09-30 MED ORDER — LIDOCAINE HCL (CARDIAC) 10 MG/ML IV SOLN
INTRAVENOUS | Status: DC | PRN
  Administered 2014-09-30: 25 mg via INTRAVENOUS

## 2014-09-30 MED ORDER — MIDAZOLAM HCL 2 MG/2ML IJ SOLN
1.0000 mg | INTRAMUSCULAR | Status: DC | PRN
Start: 2014-09-30 — End: 2014-10-02
  Administered 2014-09-30: 2 mg via INTRAVENOUS

## 2014-09-30 MED ORDER — ONDANSETRON HCL 4 MG/2ML IJ SOLN
INTRAMUSCULAR | Status: AC
Start: 2014-09-30 — End: 2014-09-30
  Filled 2014-09-30: qty 2

## 2014-09-30 MED ORDER — IBUPROFEN 800 MG PO TABS: 800.0000 mg | ORAL_TABLET | Freq: Three times a day (TID) | ORAL | Status: DC | PRN

## 2014-09-30 MED ORDER — LIDOCAINE HCL (PF) 0.5 % IJ SOLN
INTRAMUSCULAR | Status: DC | PRN
  Administered 2014-09-30: 250 mg

## 2014-09-30 MED ORDER — FENTANYL CITRATE (PF) 100 MCG/2ML IJ SOLN
INTRAMUSCULAR | Status: DC | PRN
  Administered 2014-09-30 (×4): 25 ug via INTRAVENOUS

## 2014-09-30 MED ORDER — 0.9 % SODIUM CHLORIDE (POUR BTL) OPTIME
TOPICAL | Status: DC | PRN
  Administered 2014-09-30: 1000 mL

## 2014-09-30 SURGICAL SUPPLY — 37 items
BAG HAMPER (MISCELLANEOUS) ×2 IMPLANT
BANDAGE ELASTIC 3 VELCRO NS (GAUZE/BANDAGES/DRESSINGS) ×2 IMPLANT
BANDAGE ESMARK 4X12 BL STRL LF (DISPOSABLE) ×1 IMPLANT
BLADE SURG 15 STRL LF DISP TIS (BLADE) ×1 IMPLANT
BLADE SURG 15 STRL SS (BLADE) ×2
BNDG CMPR 12X4 ELC STRL LF (DISPOSABLE) ×1
BNDG COHESIVE 4X5 TAN STRL (GAUZE/BANDAGES/DRESSINGS) ×2 IMPLANT
BNDG ESMARK 4X12 BLUE STRL LF (DISPOSABLE) ×2
BNDG GAUZE ELAST 4 BULKY (GAUZE/BANDAGES/DRESSINGS) ×1 IMPLANT
CHLORAPREP W/TINT 26ML (MISCELLANEOUS) ×2 IMPLANT
CLOTH BEACON ORANGE TIMEOUT ST (SAFETY) ×2 IMPLANT
COVER LIGHT HANDLE STERIS (MISCELLANEOUS) ×4 IMPLANT
CUFF TOURNIQUET SINGLE 18IN (TOURNIQUET CUFF) ×2 IMPLANT
DECANTER SPIKE VIAL GLASS SM (MISCELLANEOUS) ×2 IMPLANT
DRSG XEROFORM 1X8 (GAUZE/BANDAGES/DRESSINGS) ×1 IMPLANT
ELECT NDL TIP 2.8 STRL (NEEDLE) ×1 IMPLANT
ELECT NEEDLE TIP 2.8 STRL (NEEDLE) ×2 IMPLANT
ELECT REM PT RETURN 9FT ADLT (ELECTROSURGICAL) ×2
ELECTRODE REM PT RTRN 9FT ADLT (ELECTROSURGICAL) ×1 IMPLANT
GAUZE SPONGE 4X4 12PLY STRL (GAUZE/BANDAGES/DRESSINGS) ×1 IMPLANT
GLOVE SKINSENSE NS SZ8.0 LF (GLOVE) ×1
GLOVE SKINSENSE STRL SZ8.0 LF (GLOVE) ×1 IMPLANT
GLOVE SS N UNI LF 8.5 STRL (GLOVE) ×2 IMPLANT
GOWN STRL REUS W/ TWL LRG LVL3 (GOWN DISPOSABLE) ×1 IMPLANT
GOWN STRL REUS W/TWL LRG LVL3 (GOWN DISPOSABLE) ×2
GOWN STRL REUS W/TWL XL LVL3 (GOWN DISPOSABLE) ×2 IMPLANT
HAND ALUMI XLG (SOFTGOODS) ×2 IMPLANT
KIT ROOM TURNOVER APOR (KITS) ×2 IMPLANT
MANIFOLD NEPTUNE II (INSTRUMENTS) ×2 IMPLANT
NDL HYPO 21X1.5 SAFETY (NEEDLE) ×1 IMPLANT
NEEDLE HYPO 21X1.5 SAFETY (NEEDLE) ×2 IMPLANT
NS IRRIG 1000ML POUR BTL (IV SOLUTION) ×2 IMPLANT
PACK BASIC LIMB (CUSTOM PROCEDURE TRAY) ×2 IMPLANT
PAD ARMBOARD 7.5X6 YLW CONV (MISCELLANEOUS) ×2 IMPLANT
SET BASIN LINEN APH (SET/KITS/TRAYS/PACK) ×2 IMPLANT
SUT ETHILON 3 0 FSL (SUTURE) ×2 IMPLANT
SYR CONTROL 10ML LL (SYRINGE) ×2 IMPLANT

## 2014-09-30 NOTE — Brief Op Note (Signed)
09/30/2014  8:31 AM  PATIENT:  Erica Graves  49 y.o. female  PRE-OPERATIVE DIAGNOSIS:  LEFT CARPAL TUNNEL RELEASE  POST-OPERATIVE DIAGNOSIS:  LEFT CARPAL TUNNEL RELEASE  Surgical findings tight carpal tunnel with poor color and poor tension  Carpal tunnel release LEFT wrist  Indications failure of conservative treatment to relieve pain and paresthesias and numbness and tingling of the LEFT  hand.  The patient was identified in the preop area we confirm the surgical site marked as LEFT wrist. Chart update completed. Patient taken to surgery. She had 2 g of Ancef. After establishing a Bier block her arm was prepped with ChloraPrep.  Timeout executed completed and confirmed site.  A straight incision was made over the LEFT carpal tunnel in line with the radial border of the ring finger. Blunt dissection was carried out to find the distal aspect of the carpal tunnel. A blunted INSTRUMENT was passed beneath the carpal tunnel. Sharp incision was then used to release the transverse carpal ligament. The contents of the carpal tunnel were inspected. The median nerve was compressed with discoloration.  The wound was irrigated and then closed with 3-0 nylon suture. We injected 10 mL of plain Marcaine on the radial side of the incision  A sterile bandage was applied and the tourniquet was released the color of the hand and capillary refill were normal  The patient was taken to the recovery room in stable condition   PROCEDURE:  Procedure(s): CARPAL TUNNEL RELEASE (Left)  SURGEON:  Surgeon(s) and Role:    * Taisha Pennebaker E Sherilynn Dieu, MD - Primary  PHYSICIAN ASSISTANT:   ASSISTANTS: none   ANESTHESIA:   regional  EBL:  Total I/O In: 400 [I.V.:400] Out: 5 [Blood:5]  BLOOD ADMINISTERED:none  DRAINS: none   LOCAL MEDICATIONS USED:  MARCAINE     SPECIMEN:  No Specimen  DISPOSITION OF SPECIMEN:  N/A  COUNTS:  YES  TOURNIQUET:   Total Tourniquet Time Documented: Upper Arm (Left) - 1  minutes Upper Arm (Left) - 28 minutes Total: Upper Arm (Left) - 29 minutes   DICTATION: .Dragon Dictation  PLAN OF CARE: Discharge to home after PACU  PATIENT DISPOSITION:  PACU - hemodynamically stable.   Delay start of Pharmacological VTE agent (>24hrs) due to surgical blood loss or risk of bleeding: not applicable 

## 2014-09-30 NOTE — Op Note (Signed)
09/30/2014  8:31 AM  PATIENT:  Erica Graves  49 y.o. female  PRE-OPERATIVE DIAGNOSIS:  LEFT CARPAL TUNNEL RELEASE  POST-OPERATIVE DIAGNOSIS:  LEFT CARPAL TUNNEL RELEASE  Surgical findings tight carpal tunnel with poor color and poor tension  Carpal tunnel release LEFT wrist  Indications failure of conservative treatment to relieve pain and paresthesias and numbness and tingling of the LEFT  hand.  The patient was identified in the preop area we confirm the surgical site marked as LEFT wrist. Chart update completed. Patient taken to surgery. She had 2 g of Ancef. After establishing a Bier block her arm was prepped with ChloraPrep.  Timeout executed completed and confirmed site.  A straight incision was made over the LEFT carpal tunnel in line with the radial border of the ring finger. Blunt dissection was carried out to find the distal aspect of the carpal tunnel. A blunted INSTRUMENT was passed beneath the carpal tunnel. Sharp incision was then used to release the transverse carpal ligament. The contents of the carpal tunnel were inspected. The median nerve was compressed with discoloration.  The wound was irrigated and then closed with 3-0 nylon suture. We injected 10 mL of plain Marcaine on the radial side of the incision  A sterile bandage was applied and the tourniquet was released the color of the hand and capillary refill were normal  The patient was taken to the recovery room in stable condition   PROCEDURE:  Procedure(s): CARPAL TUNNEL RELEASE (Left)  SURGEON:  Surgeon(s) and Role:    * Vickki Hearing, MD - Primary  PHYSICIAN ASSISTANT:   ASSISTANTS: none   ANESTHESIA:   regional  EBL:  Total I/O In: 400 [I.V.:400] Out: 5 [Blood:5]  BLOOD ADMINISTERED:none  DRAINS: none   LOCAL MEDICATIONS USED:  MARCAINE     SPECIMEN:  No Specimen  DISPOSITION OF SPECIMEN:  N/A  COUNTS:  YES  TOURNIQUET:   Total Tourniquet Time Documented: Upper Arm (Left) - 1  minutes Upper Arm (Left) - 28 minutes Total: Upper Arm (Left) - 29 minutes   DICTATION: .Reubin Milan Dictation  PLAN OF CARE: Discharge to home after PACU  PATIENT DISPOSITION:  PACU - hemodynamically stable.   Delay start of Pharmacological VTE agent (>24hrs) due to surgical blood loss or risk of bleeding: not applicable

## 2014-09-30 NOTE — Anesthesia Preprocedure Evaluation (Signed)
Anesthesia Evaluation  Patient identified by MRN, date of birth, ID band Patient awake    Reviewed: Allergy & Precautions, H&P , NPO status , Patient's Chart, lab work & pertinent test results  History of Anesthesia Complications Negative for: history of anesthetic complications  Airway Mallampati: II       Dental  (+) Teeth Intact   Pulmonary    breath sounds clear to auscultation       Cardiovascular negative cardio ROS   Rhythm:Regular Rate:Normal     Neuro/Psych  Headaches, PSYCHIATRIC DISORDERS Depression  Neuromuscular disease    GI/Hepatic negative GI ROS, Neg liver ROS,   Endo/Other  negative endocrine ROS  Renal/GU Renal disease     Musculoskeletal  (+) Arthritis  (chronic LBP),   Abdominal (+) + obese,  Abdomen: soft.    Peds  Hematology negative hematology ROS (+)   Anesthesia Other Findings   Reproductive/Obstetrics                             Anesthesia Physical Anesthesia Plan  ASA: II  Anesthesia Plan: MAC and Bier Block   Post-op Pain Management:    Induction: Intravenous  Airway Management Planned: Simple Face Mask  Additional Equipment:   Intra-op Plan:   Post-operative Plan:   Informed Consent: I have reviewed the patients History and Physical, chart, labs and discussed the procedure including the risks, benefits and alternatives for the proposed anesthesia with the patient or authorized representative who has indicated his/her understanding and acceptance.     Plan Discussed with:   Anesthesia Plan Comments:         Anesthesia Quick Evaluation  

## 2014-09-30 NOTE — Anesthesia Postprocedure Evaluation (Signed)
  Anesthesia Post-op Note  Patient: Erica Graves  Procedure(s) Performed: Procedure(s): CARPAL TUNNEL RELEASE (Left)  Patient Location: PACU  Anesthesia Type:MAC and Bier block  Level of Consciousness: awake, alert , oriented and patient cooperative  Airway and Oxygen Therapy: Patient Spontanous Breathing and Patient connected to face mask oxygen  Post-op Pain: mild  Post-op Assessment: Post-op Vital signs reviewed, Patient's Cardiovascular Status Stable, Respiratory Function Stable, Patent Airway, No signs of Nausea or vomiting and Pain level controlled              Post-op Vital Signs: Reviewed and stable  Last Vitals:  Filed Vitals:   09/30/14 0833  BP:   Pulse: 74  Temp:   Resp: 14    Complications: No apparent anesthesia complications

## 2014-09-30 NOTE — Anesthesia Procedure Notes (Signed)
Anesthesia Regional Block:  Bier block (IV Regional)  Pre-Anesthetic Checklist: ,, timeout performed, Correct Patient, Correct Site, Correct Laterality, Correct Procedure,, site marked, surgical consent,, at surgeon's request  Laterality: Left     Needles:  Injection technique: Single-shot  Needle Type: Other      Needle Gauge: 22 and 22 G    Additional Needles: Bier block (IV Regional)  Nerve Stimulator or Paresthesia:   Additional Responses:  Pulse checked post tourniquet inflation. IV NSL discontinued post injection. Narrative:  Start time: 09/30/2014 7:55 AM  Performed by: Personally

## 2014-09-30 NOTE — Interval H&P Note (Signed)
History and Physical Interval Note:  09/30/2014 7:12 AM  Erica Graves  has presented today for surgery, with the diagnosis of LEFT CARPAL TUNNEL RELEASE  The various methods of treatment have been discussed with the patient and family. After consideration of risks, benefits and other options for treatment, the patient has consented to  Procedure(s): CARPAL TUNNEL RELEASE (Left) as a surgical intervention .  The patient's history has been reviewed, patient examined, no change in status, stable for surgery.  I have reviewed the patient's chart and labs.  Questions were answered to the patient's satisfaction.     Fuller Canada

## 2014-09-30 NOTE — Transfer of Care (Signed)
Immediate Anesthesia Transfer of Care Note  Patient: Erica Graves  Procedure(s) Performed: Procedure(s): CARPAL TUNNEL RELEASE (Left)  Patient Location: PACU  Anesthesia Type:MAC and Bier block  Level of Consciousness: awake, alert , oriented and patient cooperative  Airway & Oxygen Therapy: Patient Spontanous Breathing and Patient connected to face mask oxygen  Post-op Assessment: Report given to RN and Post -op Vital signs reviewed and stable  Post vital signs: Reviewed and stable  Last Vitals:  Filed Vitals:   09/30/14 0715  BP: 177/94  Pulse:   Temp:   Resp: 10    Complications: No apparent anesthesia complications

## 2014-10-01 ENCOUNTER — Encounter (HOSPITAL_COMMUNITY): Payer: Self-pay | Admitting: Orthopedic Surgery

## 2014-10-01 NOTE — Addendum Note (Signed)
Addendum  created 10/01/14 1113 by Earleen Newport, CRNA   Modules edited: Anesthesia Medication Administration

## 2014-10-04 ENCOUNTER — Ambulatory Visit (INDEPENDENT_AMBULATORY_CARE_PROVIDER_SITE_OTHER): Payer: BC Managed Care – PPO | Admitting: Orthopedic Surgery

## 2014-10-04 VITALS — BP 145/84 | Ht 65.0 in | Wt 180.0 lb

## 2014-10-04 DIAGNOSIS — Z4789 Encounter for other orthopedic aftercare: Secondary | ICD-10-CM

## 2014-10-04 DIAGNOSIS — G5602 Carpal tunnel syndrome, left upper limb: Secondary | ICD-10-CM

## 2014-10-04 NOTE — Progress Notes (Signed)
Erica Graves is 4 days post-op: left CTR .  Presenting for routine follow-up.    S:   Chief Complaint  Patient presents with  . Follow-up    post op 1, LT CTR, DOS 09/30/14   Doing well.  Patient has noted no excessive redness, swelling and pain.    O:  Wound healing well.   A:  Satisfactory course.    P: Sutures left in place.  .  Patient to return on 16th  Patient is to return for suture removal she is to start rom exercises

## 2014-10-07 ENCOUNTER — Ambulatory Visit: Payer: BC Managed Care – PPO | Admitting: Adult Health

## 2014-10-07 ENCOUNTER — Encounter: Payer: Self-pay | Admitting: Adult Health

## 2014-10-12 ENCOUNTER — Ambulatory Visit (INDEPENDENT_AMBULATORY_CARE_PROVIDER_SITE_OTHER): Payer: BC Managed Care – PPO | Admitting: Orthopedic Surgery

## 2014-10-12 VITALS — BP 135/91 | Ht 65.0 in | Wt 180.0 lb

## 2014-10-12 DIAGNOSIS — Z4789 Encounter for other orthopedic aftercare: Secondary | ICD-10-CM

## 2014-10-12 NOTE — Progress Notes (Signed)
Chief Complaint  Patient presents with  . Follow-up    Post op #2, left CTR. DOS 09-30-14.    Carpal tunnel release symptoms have resolved. Wound looks clean sutures are removed patient follow-up as needed related to the carpal tunnel

## 2015-01-18 ENCOUNTER — Ambulatory Visit (INDEPENDENT_AMBULATORY_CARE_PROVIDER_SITE_OTHER): Payer: BC Managed Care – PPO | Admitting: Orthopedic Surgery

## 2015-01-18 ENCOUNTER — Ambulatory Visit (INDEPENDENT_AMBULATORY_CARE_PROVIDER_SITE_OTHER): Payer: BC Managed Care – PPO

## 2015-01-18 ENCOUNTER — Encounter: Payer: Self-pay | Admitting: Orthopedic Surgery

## 2015-01-18 VITALS — BP 166/94 | Ht 65.0 in | Wt 201.6 lb

## 2015-01-18 DIAGNOSIS — S63502A Unspecified sprain of left wrist, initial encounter: Secondary | ICD-10-CM | POA: Diagnosis not present

## 2015-01-18 DIAGNOSIS — M25532 Pain in left wrist: Secondary | ICD-10-CM | POA: Diagnosis not present

## 2015-01-18 DIAGNOSIS — M654 Radial styloid tenosynovitis [de Quervain]: Secondary | ICD-10-CM | POA: Diagnosis not present

## 2015-01-18 NOTE — Progress Notes (Signed)
New problem established patient  Medical decision-making  Data order and interpreted x-ray of the left wrist x-ray is normal except for some [facility]  Diagnosis sprain of the wrist/de Quervain's traumatic new problem  Risk we placed her in a brace and she will wear that for the balance of 6 weeks.  Chief Complaint  Patient presents with  . Wrist Pain    Left wrist pain    HPI: About 2 weeks ago this 49 year old patient with a history of bilateral carpal tunnel syndrome picked up a bag twisted her wrist felt a pop over the first extensor compartment of the left wrist. This was associated with swelling and severe pain. The pain is now gotten much better except when she places the wrist in certain positions.  Review of systems she does not have any numbness or tingling associated with this and there is no frank weakness and there is been no fever or rash  PHYSICAL BP 166/94 mmHg  Ht 5\' 5"  (1.651 m)  Wt 201 lb 9.6 oz (91.445 kg)  BMI 33.55 kg/m2 Appearance normal body habitus grooming hygiene Oriented person place and time Mood pleasant Gait normal Right wrist looks normal compared to the left full range of motion neurovascular exam and skin intact and normal  Left wrist is tender with swelling over the first extensor compartment and painful thumb extension against resistance but the IP joint and the MP joint range of motion strength normal stable wrist no weakness skin normal sensation normal pulses normal in the radial artery  Diagnosis sprain left wrist with possible traumatic de Quervain's syndrome  Recommend splinting    Encounter Diagnoses  Name Primary?  . Left wrist pain Yes  . Sprain of wrist, left, initial encounter   . De Quervain's disease (radial styloid tenosynovitis)

## 2015-01-26 ENCOUNTER — Ambulatory Visit (INDEPENDENT_AMBULATORY_CARE_PROVIDER_SITE_OTHER): Payer: BC Managed Care – PPO | Admitting: Nurse Practitioner

## 2015-01-26 VITALS — BP 122/84 | Ht 66.0 in | Wt 201.5 lb

## 2015-01-26 DIAGNOSIS — Z23 Encounter for immunization: Secondary | ICD-10-CM | POA: Diagnosis not present

## 2015-01-26 DIAGNOSIS — Z13220 Encounter for screening for lipoid disorders: Secondary | ICD-10-CM | POA: Diagnosis not present

## 2015-01-26 DIAGNOSIS — Z0000 Encounter for general adult medical examination without abnormal findings: Secondary | ICD-10-CM | POA: Diagnosis not present

## 2015-01-26 DIAGNOSIS — E559 Vitamin D deficiency, unspecified: Secondary | ICD-10-CM

## 2015-01-26 DIAGNOSIS — Z79899 Other long term (current) drug therapy: Secondary | ICD-10-CM

## 2015-01-26 MED ORDER — PHENTERMINE HCL 37.5 MG PO TABS: 37.5000 mg | ORAL_TABLET | Freq: Every day | ORAL | Status: DC

## 2015-01-28 ENCOUNTER — Encounter: Payer: Self-pay | Admitting: Nurse Practitioner

## 2015-01-28 NOTE — Progress Notes (Signed)
Subjective:   Presents for routine exam.  Gets regular preventive health physicals with gynecology. Needs routine labs. Limited activity due to orthopedic issues. No CP/ischmeic type pain or SOB. History of vitamin D deficiency. Has taken Phentermine without difficulty in the past.  Objective:   BP 122/84 mmHg  Ht 5\' 6"  (1.676 m)  Wt 201 lb 8 oz (91.4 kg)  BMI 32.54 kg/m2 NAD. Alert, oriented. TMs clear effusion. Pharynx clear. Neck supple with minimal adenopathy. Lungs clear. Heart RRR. Lower extremities no edema.   Assessment:  Problem List Items Addressed This Visit      Other   Morbid obesity (HCC)   Relevant Medications   phentermine (ADIPEX-P) 37.5 MG tablet   Other Relevant Orders   Basic metabolic panel   Vitamin D deficiency   Relevant Orders   VITAMIN D 25 Hydroxy (Vit-D Deficiency, Fractures)    Other Visit Diagnoses    Routine general medical examination at a health care facility    -  Primary    Relevant Orders    Lipid panel    Hepatic function panel    VITAMIN D 25 Hydroxy (Vit-D Deficiency, Fractures)    Hepatitis C Antibody    HIV antibody (with reflex)    Screening, lipid        Relevant Orders    Lipid panel    High risk medication use        Relevant Orders    Hepatic function panel    Encounter for immunization           Plan:  Meds ordered this encounter  Medications  . phentermine (ADIPEX-P) 37.5 MG tablet    Sig: Take 1 tablet (37.5 mg total) by mouth daily before breakfast.    Dispense:  30 tablet    Refill:  2    Order Specific Question:  Supervising Provider    Answer:  Merlyn AlbertLUKING, WILLIAM S [[id number]   Encouraged activity as tolerated. Encouraged healthy diet and weight loss.  Labs pending. Recheck here as needed.

## 2015-02-14 LAB — BASIC METABOLIC PANEL
BUN/Creatinine Ratio: 24 — ABNORMAL HIGH (ref 9–23)
BUN: 17 mg/dL (ref 6–24)
CO2: 23 mmol/L (ref 18–29)
Calcium: 9.3 mg/dL (ref 8.7–10.2)
Chloride: 104 mmol/L (ref 96–106)
Creatinine, Ser: 0.72 mg/dL (ref 0.57–1.00)
GFR calc Af Amer: 112 mL/min/{1.73_m2} (ref >59)
GFR, EST NON AFRICAN AMERICAN: 97 mL/min/{1.73_m2} (ref >59)
Glucose: 97 mg/dL (ref 65–99)
POTASSIUM: 4.5 mmol/L (ref 3.5–5.2)
SODIUM: 144 mmol/L (ref 134–144)

## 2015-02-14 LAB — HEPATIC FUNCTION PANEL
ALT: 11 IU/L (ref 0–32)
AST: 19 IU/L (ref 0–40)
Albumin: 4.4 g/dL (ref 3.5–5.5)
Alkaline Phosphatase: 75 IU/L (ref 39–117)
BILIRUBIN TOTAL: 0.2 mg/dL (ref 0.0–1.2)
BILIRUBIN, DIRECT: 0.08 mg/dL (ref 0.00–0.40)
TOTAL PROTEIN: 7.1 g/dL (ref 6.0–8.5)

## 2015-02-14 LAB — LIPID PANEL
Chol/HDL Ratio: 3.5 ratio units (ref 0.0–4.4)
Cholesterol, Total: 154 mg/dL (ref 100–199)
HDL: 44 mg/dL (ref >39)
LDL Calculated: 95 mg/dL (ref 0–99)
Triglycerides: 76 mg/dL (ref 0–149)
VLDL Cholesterol Cal: 15 mg/dL (ref 5–40)

## 2015-02-14 LAB — HIV ANTIBODY (ROUTINE TESTING W REFLEX): HIV Screen 4th Generation wRfx: NONREACTIVE

## 2015-02-14 LAB — HEPATITIS C ANTIBODY

## 2015-02-14 LAB — VITAMIN D 25 HYDROXY (VIT D DEFICIENCY, FRACTURES): Vit D, 25-Hydroxy: 11.8 ng/mL — ABNORMAL LOW (ref 30.0–100.0)

## 2015-02-22 ENCOUNTER — Other Ambulatory Visit: Payer: Self-pay | Admitting: Nurse Practitioner

## 2015-02-22 MED ORDER — VITAMIN D (ERGOCALCIFEROL) 1.25 MG (50000 UNIT) PO CAPS: 50000.0000 [IU] | ORAL_CAPSULE | ORAL | Status: DC

## 2015-03-14 ENCOUNTER — Encounter: Payer: Self-pay | Admitting: Nurse Practitioner

## 2015-03-14 ENCOUNTER — Ambulatory Visit (INDEPENDENT_AMBULATORY_CARE_PROVIDER_SITE_OTHER): Payer: BC Managed Care – PPO | Admitting: Nurse Practitioner

## 2015-03-14 ENCOUNTER — Encounter: Payer: Self-pay | Admitting: Family Medicine

## 2015-03-14 VITALS — BP 122/90 | Temp 98.6°F | Ht 66.0 in | Wt 201.0 lb

## 2015-03-14 DIAGNOSIS — J111 Influenza due to unidentified influenza virus with other respiratory manifestations: Secondary | ICD-10-CM | POA: Diagnosis not present

## 2015-03-14 MED ORDER — OSELTAMIVIR PHOSPHATE 75 MG PO CAPS: 75.0000 mg | ORAL_CAPSULE | Freq: Two times a day (BID) | ORAL | Status: DC

## 2015-03-14 NOTE — Progress Notes (Signed)
Subjective:  Presents for c/o sudden onset low grade fever, cough, headache and sore throat that began 2 days ago. Myalgias. Fatigue. Mild wheezing which has improved. No ear pain. No V/D or abd pain. Taking fluids well. Voiding nl.   Objective:   BP 122/90 mmHg  Temp(Src) 98.6 F (37 C) (Oral)  Ht 5\' 6"  (1.676 m)  Wt 201 lb (91.173 kg)  BMI 32.46 kg/m2 NAD. Alert, oriented. TMs clear effusion. Pharynx clear. Neck supple with mild anterior adenopathy. Lungs clear. Heart RRR.   Assessment: Influenza  Plan:  Meds ordered this encounter  Medications  . oseltamivir (TAMIFLU) 75 MG capsule    Sig: Take 1 capsule (75 mg total) by mouth 2 (two) times daily.    Dispense:  10 capsule    Refill:  0    Order Specific Question:  Supervising Provider    Answer:  Merlyn AlbertLUKING, WILLIAM S [[id number]   Reviewed symptomatic care and warning signs. Call back in 72 hours if no improvement, sooner if worse.

## 2015-04-12 ENCOUNTER — Ambulatory Visit (INDEPENDENT_AMBULATORY_CARE_PROVIDER_SITE_OTHER): Payer: BC Managed Care – PPO | Admitting: Orthopedic Surgery

## 2015-04-12 ENCOUNTER — Encounter: Payer: Self-pay | Admitting: Orthopedic Surgery

## 2015-04-12 VITALS — BP 153/95 | Ht 66.0 in | Wt 201.0 lb

## 2015-04-12 DIAGNOSIS — G5601 Carpal tunnel syndrome, right upper limb: Secondary | ICD-10-CM | POA: Diagnosis not present

## 2015-04-12 NOTE — Progress Notes (Signed)
Patient ID: Erica Graves, female   DOB: 12-Sep-1965, 50 y.o.   MRN: [id number]  Chief Complaint  Patient presents with  . Follow-up    FOLLOW UP RT CARPAL TUNNEL    BP 153/95 mmHg  Ht  (1.676 m)  Wt 201 lb (91.173 kg)  BMI 32.46 kg/m2  Ongoing symptoms right hand numbness tingling thumb index and ring and long finger. Patient requests carpal tunnel injection.  Right Hand Exam   Tenderness  The patient is experiencing tenderness in the palmer area.  Range of Motion  The patient has normal right wrist ROM.   Muscle Strength  The patient has normal right wrist strength.  Tests  Phalen's Sign: positive Tinel's Sign (Medial Nerve): positive  Other  Erythema: absent Scars: absent Sensation: decreased Pulse: present     Right Carpal tunnel injection  Diagnosis right carpal tunnel syndrome  Medications Depo-Medrol 40 mg. 1% lidocaine 3 mL. Alcohol prep for the skin along with ethyl chloride for anesthesia  Verbal consent  Time out to confirm site of injection  using a 25-gauge needle, the right carpal tunnel was injected.  There were no complications  Appropriate precautions were given after injection  ASSESSMENT AND PLAN follow-up early summer late spring to discuss possible surgical release right carpal tunnel

## 2015-04-12 NOTE — Patient Instructions (Signed)

## 2015-04-18 ENCOUNTER — Encounter: Payer: Self-pay | Admitting: *Deleted

## 2015-04-18 ENCOUNTER — Ambulatory Visit: Payer: BC Managed Care – PPO | Admitting: Orthopedic Surgery

## 2015-04-21 ENCOUNTER — Ambulatory Visit: Payer: BC Managed Care – PPO | Admitting: Orthopedic Surgery

## 2015-05-06 ENCOUNTER — Ambulatory Visit (INDEPENDENT_AMBULATORY_CARE_PROVIDER_SITE_OTHER): Payer: BC Managed Care – PPO | Admitting: Nurse Practitioner

## 2015-05-06 ENCOUNTER — Encounter: Payer: Self-pay | Admitting: Nurse Practitioner

## 2015-05-06 VITALS — BP 130/80 | Ht 66.0 in | Wt 201.0 lb

## 2015-05-06 DIAGNOSIS — H8390 Unspecified disease of inner ear, unspecified ear: Secondary | ICD-10-CM | POA: Diagnosis not present

## 2015-05-06 DIAGNOSIS — R946 Abnormal results of thyroid function studies: Secondary | ICD-10-CM

## 2015-05-06 DIAGNOSIS — K219 Gastro-esophageal reflux disease without esophagitis: Secondary | ICD-10-CM | POA: Diagnosis not present

## 2015-05-06 DIAGNOSIS — R42 Dizziness and giddiness: Secondary | ICD-10-CM

## 2015-05-06 DIAGNOSIS — J300 Vasomotor rhinitis: Secondary | ICD-10-CM

## 2015-05-06 DIAGNOSIS — K581 Irritable bowel syndrome with constipation: Secondary | ICD-10-CM | POA: Diagnosis not present

## 2015-05-06 DIAGNOSIS — R5383 Other fatigue: Secondary | ICD-10-CM | POA: Diagnosis not present

## 2015-05-06 LAB — POCT HEMOGLOBIN: Hemoglobin: 13.1 g/dL (ref 12.2–16.2)

## 2015-05-06 LAB — POCT GLYCOSYLATED HEMOGLOBIN (HGB A1C): Hemoglobin A1C: 5.6

## 2015-05-06 NOTE — Patient Instructions (Signed)
Try Miralax for 5-7 days; benefiber as directed  Constipation, Adult Constipation is when a person has fewer than three bowel movements a week, has difficulty having a bowel movement, or has stools that are dry, hard, or larger than normal. As people grow older, constipation is more common. A low-fiber diet, not taking in enough fluids, and taking certain medicines may make constipation worse.  CAUSES   Certain medicines, such as antidepressants, pain medicine, iron supplements, antacids, and water pills.   Certain diseases, such as diabetes, irritable bowel syndrome (IBS), thyroid disease, or depression.   Not drinking enough water.   Not eating enough fiber-rich foods.   Stress or travel.   Lack of physical activity or exercise.   Ignoring the urge to have a bowel movement.   Using laxatives too much.  SIGNS AND SYMPTOMS   Having fewer than three bowel movements a week.   Straining to have a bowel movement.   Having stools that are hard, dry, or larger than normal.   Feeling full or bloated.   Pain in the lower abdomen.   Not feeling relief after having a bowel movement.  DIAGNOSIS  Your health care provider will take a medical history and perform a physical exam. Further testing may be done for severe constipation. Some tests may include:  A barium enema X-ray to examine your rectum, colon, and, sometimes, your small intestine.   A sigmoidoscopy to examine your lower colon.   A colonoscopy to examine your entire colon. TREATMENT  Treatment will depend on the severity of your constipation and what is causing it. Some dietary treatments include drinking more fluids and eating more fiber-rich foods. Lifestyle treatments may include regular exercise. If these diet and lifestyle recommendations do not help, your health care provider may recommend taking over-the-counter laxative medicines to help you have bowel movements. Prescription medicines may be  prescribed if over-the-counter medicines do not work.  HOME CARE INSTRUCTIONS   Eat foods that have a lot of fiber, such as fruits, vegetables, whole grains, and beans.  Limit foods high in fat and processed sugars, such as french fries, hamburgers, cookies, candies, and soda.   A fiber supplement may be added to your diet if you cannot get enough fiber from foods.   Drink enough fluids to keep your urine clear or pale yellow.   Exercise regularly or as directed by your health care provider.   Go to the restroom when you have the urge to go. Do not hold it.   Only take over-the-counter or prescription medicines as directed by your health care provider. Do not take other medicines for constipation without talking to your health care provider first.  SEEK IMMEDIATE MEDICAL CARE IF:   You have bright red blood in your stool.   Your constipation lasts for more than 4 days or gets worse.   You have abdominal or rectal pain.   You have thin, pencil-like stools.   You have unexplained weight loss. MAKE SURE YOU:   Understand these instructions.  Will watch your condition.  Will get help right away if you are not doing well or get worse.   This information is not intended to replace advice given to you by your health care provider. Make sure you discuss any questions you have with your health care provider.   Document Released: 11/11/2003 Document Revised: 03/05/2014 Document Reviewed: 11/24/2012 Elsevier Interactive Patient Education [employer]2016 [employer] Inc[employer]

## 2015-05-06 NOTE — Progress Notes (Signed)
Subjective:  Presents for complaints of dizziness and fatigue that began the last weekend in February after having what she thinks was a viral illness/cold with sneezing and coughing and achiness. Those symptoms have resolved. Dizziness seems to be unassociated with position. Can occur at rest. No chest pain with activity. No shortness of breath. No palpitations. No edema. Mild sinus pressure. No severe headache. No numbness or weakness of the face arms or legs. No difficulty speaking or swallowing. Sleeping well, denies signs of sleep apnea. Recently diagnosed with vitamin D deficiency, is taking her vitamin D daily. Some mild generalized upper abdominal pain better since she started taking Pepcid OTC. Has had some constipation about the same length of time. Takes Dulcolax occasionally for her bowel movements. Stress level has been "out the roof".  Objective:   BP 130/80 mmHg  Ht 5\' 6"  (1.676 m)  Wt 201 lb (91.173 kg)  BMI 32.46 kg/m2 NAD. Alert, oriented. TMs significant clear effusion, no erythema. Nasal mucosa pale and boggy. Pharynx injected with clear PND noted. Neck supple with mild soft anterior adenopathy. Thyroid left side more prominent, no mass or goiter noted. Mildly tender to palpation. Lungs clear. Heart regular rate rhythm. Orthostatic blood pressures stable. Abdomen soft nondistended with mild epigastric area tenderness, otherwise benign. EOMs intact without nystagmus. Point-to-point localization normal limit. Romberg negative. Results for orders placed or performed in visit on 05/06/15  POCT glycosylated hemoglobin (Hb A1C)  Result Value Ref Range   Hemoglobin A1C 5.6   POCT hemoglobin  Result Value Ref Range   Hemoglobin 13.1 12.2 - 16.2 g/dL     Assessment:  Problem List Items Addressed This Visit      Digestive   Gastroesophageal reflux disease without esophagitis   Irritable bowel syndrome with constipation    Other Visit Diagnoses    Other fatigue    -  Primary    Relevant Orders    POCT glycosylated hemoglobin (Hb A1C) (Completed)    POCT hemoglobin (Completed)    TSH    Thyroid antibodies    Dizziness        Relevant Orders    TSH    Thyroid antibodies    Abnormal thyroid exam        Relevant Orders    TSH    Thyroid antibodies    Vasomotor rhinitis        Inner ear dysfunction, unspecified laterality            Plan: Start steroid nasal spray and antihistamine OTC. Meclizine OTC as directed for any dizziness. Lab work pending. Continue Pepcid as directed. Given written and verbal information on IBS constipation. Warning signs reviewed. Call back next week if no improvement, call or go to ED sooner if worse.

## 2015-05-08 ENCOUNTER — Emergency Department (HOSPITAL_COMMUNITY): Payer: BC Managed Care – PPO

## 2015-05-08 ENCOUNTER — Emergency Department (HOSPITAL_COMMUNITY)
Admission: EM | Admit: 2015-05-08 | Discharge: 2015-05-08 | Disposition: A | Discharge status: Home / Self Care | Payer: BC Managed Care – PPO | Attending: Emergency Medicine | Admitting: Emergency Medicine

## 2015-05-08 ENCOUNTER — Encounter (HOSPITAL_COMMUNITY): Payer: Self-pay

## 2015-05-08 DIAGNOSIS — R0789 Other chest pain: Secondary | ICD-10-CM | POA: Diagnosis not present

## 2015-05-08 DIAGNOSIS — Z79899 Other long term (current) drug therapy: Secondary | ICD-10-CM | POA: Diagnosis not present

## 2015-05-08 DIAGNOSIS — E669 Obesity, unspecified: Secondary | ICD-10-CM | POA: Insufficient documentation

## 2015-05-08 LAB — THYROID ANTIBODIES
Thyroglobulin Antibody: <1 IU/mL (ref 0.0–0.9)
Thyroperoxidase Ab SerPl-aCnc: 8 IU/mL (ref 0–34)

## 2015-05-08 LAB — I-STAT CHEM 8, ED
BUN: 18 mg/dL (ref 6–20)
Calcium, Ion: 1.19 mmol/L (ref 1.12–1.23)
Chloride: 106 mmol/L (ref 101–111)
Creatinine, Ser: 0.7 mg/dL (ref 0.44–1.00)
GLUCOSE: 85 mg/dL (ref 65–99)
HEMATOCRIT: 41 % (ref 36.0–46.0)
HEMOGLOBIN: 13.9 g/dL (ref 12.0–15.0)
POTASSIUM: 4.1 mmol/L (ref 3.5–5.1)
SODIUM: 143 mmol/L (ref 135–145)
TCO2: 25 mmol/L (ref 0–100)

## 2015-05-08 LAB — I-STAT TROPONIN, ED: Troponin i, poc: 0.01 ng/mL (ref 0.00–0.08)

## 2015-05-08 LAB — TSH: TSH: 1.43 u[IU]/mL (ref 0.450–4.500)

## 2015-05-08 NOTE — ED Notes (Signed)
Complain of chest pain and cough gor about two weeks

## 2015-05-08 NOTE — Discharge Instructions (Signed)
For the pain, take Tylenol, and use heat on it. See your doctor, if needed, for problems.   Chest Wall Pain Chest wall pain is pain in or around the bones and muscles of your chest. Sometimes, an injury causes this pain. Sometimes, the cause may not be known. This pain may take several weeks or longer to get better. HOME CARE INSTRUCTIONS  Pay attention to any changes in your symptoms. Take these actions to help with your pain:   Rest as told by your health care provider.   Avoid activities that cause pain. These include any activities that use your chest muscles or your abdominal and side muscles to lift heavy items.   If directed, apply ice to the painful area:  Put ice in a plastic bag.  Place a towel between your skin and the bag.  Leave the ice on for 20 minutes, 2-3 times per day.  Take over-the-counter and prescription medicines only as told by your health care provider.  Do not use tobacco products, including cigarettes, chewing tobacco, and e-cigarettes. If you need help quitting, ask your health care provider.  Keep all follow-up visits as told by your health care provider. This is important. SEEK MEDICAL CARE IF:  You have a fever.  Your chest pain becomes worse.  You have new symptoms. SEEK IMMEDIATE MEDICAL CARE IF:  You have nausea or vomiting.  You feel sweaty or light-headed.  You have a cough with phlegm (sputum) or you cough up blood.  You develop shortness of breath.   This information is not intended to replace advice given to you by your health care provider. Make sure you discuss any questions you have with your health care provider.   Document Released: 02/12/2005 Document Revised: 11/03/2014 Document Reviewed: 05/10/2014 Elsevier Interactive Patient Education [employer]2016 [employer]

## 2015-05-08 NOTE — ED Provider Notes (Signed)
CSN: [id number]     Arrival date & time 05/08/15  1313 History   First MD Initiated Contact with Patient 05/08/15 1324     Chief Complaint  Patient presents with  . Chest Pain     (Consider location/radiation/quality/duration/timing/severity/associated sxs/prior Treatment) HPI   Erica Graves is a 50 y.o. female who presents for evaluation of chest pain present for 2 weeks, felt as a intermittent, sharp sensation lasts a few seconds, 5 or 6 times each day. She also feels somewhat uncomfortable when she touches her left chest. She describes this discomfort as a "soreness". There is no associated shortness of breath, nausea, vomiting, fever, chills, cough, weakness or dizziness. She has no prior similar problem to this. She relates being under stress due to family issues. She does not smoke or drink alcohol. There are no other known modifying factors.   Past Medical History  Diagnosis Date  . Obesity   . Chronic low back pain   . Depression with anxiety   . Constipation   . Kidney stone   . Carpal tunnel syndrome, bilateral   . Hot flashes 07/07/2014  . Dyspareunia 07/07/2014  . Moody 07/07/2014  . Menopause 07/07/2014  . Umbilical hernia   . Counseling for estrogen replacement therapy 07/07/2014   Past Surgical History  Procedure Laterality Date  . Left knee arthroscopy  08/14/2005 Dr. Romeo Apple torn meniscus  . Knee arthroscopy Right   . Partial hysterectomy    . Lumbar spine surgery    . Back surgery    . Knee arthroscopy  05/21/2011    Procedure: ARTHROSCOPY KNEE;  Surgeon: Vickki Hearing, MD;  Location: [facility];  Service: Orthopedics;  Laterality: Left;  lateral menisectomy  . Abdominal hysterectomy    . Total knee arthroplasty  03/31/2012    Procedure: TOTAL KNEE ARTHROPLASTY;  Surgeon: Vickki Hearing, MD;  Location: [facility];  Service: Orthopedics;  Laterality: Left;  Left Total Knee Arthroplasty  . Knee closed reduction Left 05/26/2012    Procedure: CLOSED MANIPULATION  KNEE;  Surgeon: Vickki Hearing, MD;  Location: [facility];  Service: Orthopedics;  Laterality: Left;  . Colonoscopy N/A 06/29/2013    Procedure: COLONOSCOPY;  Surgeon: West Bali, MD;  Location: [facility];  Service: Endoscopy;  Laterality: N/A;  1:30  . Carpal tunnel release Left 09/30/2014    Procedure: CARPAL TUNNEL RELEASE;  Surgeon: Vickki Hearing, MD;  Location: [facility];  Service: Orthopedics;  Laterality: Left;   Family History  Problem Relation Age of Onset  . Colon cancer Neg Hx   . Heart disease Mother   . Hypertension Mother    Social History  Substance Use Topics  . Smoking status: Never Smoker   . Smokeless tobacco: Never Used  . Alcohol Use: Yes     Comment: occ   OB History    Gravida Para Term Preterm AB TAB SAB Ectopic Multiple Living   3 3             Review of Systems  All other systems reviewed and are negative.     Allergies  Norco  Home Medications   Prior to Admission medications   Medication Sig Start Date End Date Taking? Authorizing Provider  cholecalciferol (VITAMIN D) 1000 units tablet Take 1,000 Units by mouth daily.   Yes Historical Provider, MD   BP 133/83 mmHg  Pulse 68  Temp(Src) 98 F (36.7 C) (Oral)  Resp 18  Ht  (1.651 m)  Wt 201 lb (91.173 kg)  BMI 33.45 kg/m2  SpO2 99% Physical Exam  Constitutional: She is oriented to person, place, and time. She appears well-developed and well-nourished.  HENT:  Head: Normocephalic and atraumatic.  Right Ear: External ear normal.  Left Ear: External ear normal.  Eyes: Conjunctivae and EOM are normal. Pupils are equal, round, and reactive to light.  Neck: Normal range of motion and phonation normal. Neck supple.  Cardiovascular: Normal rate, regular rhythm and normal heart sounds.   Pulmonary/Chest: Effort normal and breath sounds normal. No respiratory distress. She exhibits tenderness (mild left anterior upper chest wall tenderness without associated crepitation or deformity).  She exhibits no bony tenderness.  Abdominal: Soft. There is no tenderness.  Musculoskeletal: Normal range of motion.  Neurological: She is alert and oriented to person, place, and time. No cranial nerve deficit or sensory deficit. She exhibits normal muscle tone. Coordination normal.  Skin: Skin is warm, dry and intact.  Psychiatric: She has a normal mood and affect. Her behavior is normal. Judgment and thought content normal.  Nursing note and vitals reviewed.   ED Course  Procedures (including critical care time)  Medications - No data to display  Patient Vitals for the past 24 hrs:  BP Temp Temp src Pulse Resp SpO2 Height Weight  05/08/15 1321 133/83 mmHg 98 F (36.7 C) Oral 68 18 99 % 5\' 5"  (1.651 m) 201 lb (91.173 kg)    3:12 PM Reevaluation with update and discussion. After initial assessment and treatment, an updated evaluation reveals she made comfortable. No chest pain at this time. Findings discussed with patient and husband, all questions were answered. Hosey Burmester L    Labs Review Labs Reviewed  I-STAT CHEM 8, ED  Rosezena SensorI-STAT TROPOININ, ED    Imaging Review Dg Chest 2 View  05/08/2015  CLINICAL DATA:  Cough, left-sided chest pain and fatigue for 2 weeks. EXAM: CHEST  2 VIEW COMPARISON:  10/31/2013 FINDINGS: Mild cardiomegaly. Left basilar/ lingular opacity likely reflects atelectasis or infiltrate. No confluent opacity on the right. No effusions. No acute bony abnormality. IMPRESSION: Left basilar/ lingular opacity, atelectasis versus infiltrate. Electronically Signed   By: Charlett NoseKevin  Dover M.D.   On: 05/08/2015 14:53   I have personally reviewed and evaluated these images and lab results as part of my medical decision-making.   EKG Interpretation   Date/Time:  Sunday May 08 2015 13:31:01 EDT Ventricular Rate:  69 PR Interval:  157 QRS Duration: 88 QT Interval:  395 QTC Calculation: 423 R Axis:   -6 Text Interpretation:  Sinus rhythm since last tracing no  significant  change Confirmed by Effie ShyWENTZ  MD, Garrus Gauthreaux ([id number]) on 05/08/2015 2:10:46 PM      MDM   Final diagnoses:  Chest wall pain    Chest wall pain without evidence for acute cardiovascular process. Doubt ACS, PE or pneumonia.  Nursing Notes Reviewed/ Care Coordinated Applicable Imaging Reviewed Interpretation of Laboratory Data incorporated into ED treatment  The patient appears reasonably screened and/or stabilized for discharge and I doubt any other medical condition or other [facility] requiring further screening, evaluation, or treatment in the ED at this time prior to discharge.  Plan: Home Medications- usual; Home Treatments- rest; return here if the recommended treatment, does not improve the symptoms; Recommended follow up- PCP prn     Mancel BaleElliott Sopheap Basic, MD 05/08/15 1523

## 2015-05-09 ENCOUNTER — Encounter: Payer: Self-pay | Admitting: Orthopedic Surgery

## 2015-05-09 ENCOUNTER — Ambulatory Visit (INDEPENDENT_AMBULATORY_CARE_PROVIDER_SITE_OTHER): Payer: BC Managed Care – PPO | Admitting: Orthopedic Surgery

## 2015-05-09 ENCOUNTER — Ambulatory Visit (INDEPENDENT_AMBULATORY_CARE_PROVIDER_SITE_OTHER): Payer: BC Managed Care – PPO

## 2015-05-09 VITALS — BP 158/93 | Ht 65.0 in | Wt 201.0 lb

## 2015-05-09 DIAGNOSIS — Z96652 Presence of left artificial knee joint: Secondary | ICD-10-CM

## 2015-05-09 NOTE — Progress Notes (Signed)
Patient ID: Erica Graves, female   DOB: Aug 06, 1965, 50 y.o.   MRN: [id number]  Post op annual TKA   Chief Complaint  Patient presents with  . Follow-up    1 YEAR FOLLOW UP + XRAY LEFT TKA, DOS 03/31/12    HPI Erica HurdleJanice M Graves is a 50 y.o. female.  3 years post op left total knee  Pain none but she says she feels like there may be some scar tissue and there   Past Medical History  Diagnosis Date  . Obesity   . Chronic low back pain   . Depression with anxiety   . Constipation   . Kidney stone   . Carpal tunnel syndrome, bilateral   . Hot flashes 07/07/2014  . Dyspareunia 07/07/2014  . Moody 07/07/2014  . Menopause 07/07/2014  . Umbilical hernia   . Counseling for estrogen replacement therapy 07/07/2014    Past Surgical History  Procedure Laterality Date  . Left knee arthroscopy  08/14/2005 Dr. Romeo AppleHarrison torn meniscus  . Knee arthroscopy Right   . Partial hysterectomy    . Lumbar spine surgery    . Back surgery    . Knee arthroscopy  05/21/2011    Procedure: ARTHROSCOPY KNEE;  Surgeon: Vickki HearingStanley E Harrison, MD;  Location: [facility];  Service: Orthopedics;  Laterality: Left;  lateral menisectomy  . Abdominal hysterectomy    . Total knee arthroplasty  03/31/2012    Procedure: TOTAL KNEE ARTHROPLASTY;  Surgeon: Vickki HearingStanley E Harrison, MD;  Location: [facility];  Service: Orthopedics;  Laterality: Left;  Left Total Knee Arthroplasty  . Knee closed reduction Left 05/26/2012    Procedure: CLOSED MANIPULATION KNEE;  Surgeon: Vickki HearingStanley E Harrison, MD;  Location: [facility];  Service: Orthopedics;  Laterality: Left;  . Colonoscopy N/A 06/29/2013    Procedure: COLONOSCOPY;  Surgeon: West BaliSandi L Fields, MD;  Location: [facility];  Service: Endoscopy;  Laterality: N/A;  1:30  . Carpal tunnel release Left 09/30/2014    Procedure: CARPAL TUNNEL RELEASE;  Surgeon: Vickki HearingStanley E Harrison, MD;  Location: [facility];  Service: Orthopedics;  Laterality: Left;     Allergies  Allergen Reactions  . Norco  [Hydrocodone-Acetaminophen] Nausea And Vomiting    Current Outpatient Prescriptions  Medication Sig Dispense Refill  . cholecalciferol (VITAMIN D) 1000 units tablet Take 1,000 Units by mouth daily.     No current facility-administered medications for this visit.    Review of Systems Review of Systems  Carpal tunnel symptoms are quite distant at this time Physical Exam Blood pressure 158/93, height 5\' 5"  (1.651 m), weight 201 lb (91.173 kg).   Gen. appearance is normal there are no congenital abnormalities   The patient is oriented 3   Mood and affect are normal   Ambulation is without assistive device   Knee flexion 115  Motor exam reveals full extension without extensor lag   Data Reviewed KNEE XRAYS : Stable x-rays are seen on the plain films today no loosening of the prosthesis  Assessment S/P TKA   Plan    Follow-up in a year       [name] 05/09/2015, 4:07 PM

## 2015-07-26 ENCOUNTER — Ambulatory Visit: Payer: Self-pay | Admitting: Orthopedic Surgery

## 2015-08-01 ENCOUNTER — Ambulatory Visit (INDEPENDENT_AMBULATORY_CARE_PROVIDER_SITE_OTHER): Payer: BC Managed Care – PPO | Admitting: Orthopedic Surgery

## 2015-08-01 ENCOUNTER — Encounter: Payer: Self-pay | Admitting: Orthopedic Surgery

## 2015-08-01 VITALS — BP 142/87 | Ht 65.0 in | Wt 205.0 lb

## 2015-08-01 DIAGNOSIS — G5601 Carpal tunnel syndrome, right upper limb: Secondary | ICD-10-CM

## 2015-08-01 NOTE — Addendum Note (Signed)
Addended by: Adella HareBOOTHE, Sheran Newstrom B on: 08/01/2015 05:22 PM   Modules accepted: Orders

## 2015-08-01 NOTE — Patient Instructions (Addendum)
Surgery right carpal tunnel in august   You have received an injection of steroids. 15% of patients will have increased pain within the 24 hours postinjection.   This is transient and will go away.   We recommend that you use ice packs on the injection site for 20 minutes every 2 hours and extra strength Tylenol 2 tablets every 8 as needed until the pain resolves.  If you continue to have pain after taking the Tylenol and using the ice please call the office for further instructions.

## 2015-08-01 NOTE — Progress Notes (Signed)
Chief Complaint  Patient presents with  . Carpal Tunnel    RE EVAL RIGHT CARPAL TUNNEL    BP 142/87 mmHg  Ht 5\' 5"  (1.651 m)  Wt 205 lb (92.987 kg)  BMI 34.11 kg/m2  Right carpal Tunnel injection Right carpal tunnel syndrome Verbal consent was given for right carpal tunnel injection Timeout confirmed the injection site  The skin was prepped with alcohol in the skin was anesthetized with ethyl chloride  The carpal tunnel was injected with 40 mg of Depo-Medrol and 3 mL 1% lidocaine this was well tolerated there were no complications

## 2015-09-19 NOTE — Patient Instructions (Signed)
Erica Graves  09/19/2015     @   Your procedure is scheduled on 09/28/2015.  Report to [facility] at 8:40 A.M.  Call this number if you have problems the morning of surgery:  [phone]   Remember:  Do not eat food or drink liquids after midnight.  Take these medicines the morning of surgery with A SIP OF WATER NONE   Do not wear jewelry, make-up or nail polish.  Do not wear lotions, powders, or perfumes.  You may wear deoderant.  Do not shave 48 hours prior to surgery.  Men may shave face and neck.  Do not bring valuables to the hospital.  [facility] is not responsible for any belongings or valuables.  Contacts, dentures or bridgework may not be worn into surgery.  Leave your suitcase in the car.  After surgery it may be brought to your room.  For patients admitted to the hospital, discharge time will be determined by your treatment team.  Patients discharged the day of surgery will not be allowed to drive home.    Please read over the following fact sheets that you were given. Surgical Site Infection Prevention and Anesthesia Post-op Instructions     PATIENT INSTRUCTIONS POST-ANESTHESIA  IMMEDIATELY FOLLOWING SURGERY:  Do not drive or operate machinery for the first twenty four hours after surgery.  Do not make any important decisions for twenty four hours after surgery or while taking narcotic pain medications or sedatives.  If you develop intractable nausea and vomiting or a severe headache please notify your doctor immediately.  FOLLOW-UP:  Please make an appointment with your surgeon as instructed. You do not need to follow up with anesthesia unless specifically instructed to do so.  WOUND CARE INSTRUCTIONS (if applicable):  Keep a dry clean dressing on the anesthesia/puncture wound site if there is drainage.  Once the wound has quit draining you may leave it open to air.  Generally you should leave the bandage intact for twenty four hours  unless there is drainage.  If the epidural site drains for more than 36-48 hours please call the anesthesia department.  QUESTIONS?:  Please feel free to call your physician or the hospital operator if you have any questions, and they will be happy to assist you.      Carpal Tunnel Release Carpal tunnel release is a surgical procedure to relieve numbness and pain in your hand that are caused by carpal tunnel syndrome. Your carpal tunnel is a narrow, hollow space in your wrist. It passes between your wrist bones and a band of connective tissue (transverse carpal ligament). The nerve that supplies most of your hand (median nerve) passes through this space, and so do the connections between your fingers and the muscles of your arm (tendons). Carpal tunnel syndrome makes this space swell and become narrow, and this causes pain and numbness. In carpal tunnel release surgery, a surgeon cuts through the transverse carpal ligament to make more room in the carpal tunnel space. You may have this surgery if other types of treatment have not worked. LET [facility] CARE PROVIDER KNOW ABOUT:  Any allergies you have.  All medicines you are taking, including vitamins, herbs, eye drops, creams, and over-the-counter medicines.  Previous problems you or members of your family have had with the use of anesthetics.  Any blood disorders you have.  Previous surgeries you have had.  Medical conditions you have. RISKS AND COMPLICATIONS Generally, this is a safe procedure. However, problems  may occur, including:  Bleeding.  Infection.  Injury to the median nerve.  Need for additional surgery. BEFORE THE PROCEDURE  Ask your health care provider about:  Changing or stopping your regular medicines. This is especially important if you are taking diabetes medicines or blood thinners.  Taking medicines such as aspirin and ibuprofen. These medicines can thin your blood. Do not take these medicines before your  procedure if your health care provider instructs you not to.  Do not eat or drink anything after midnight on the night before the procedure or as directed by your health care provider.  Plan to have someone take you home after the procedure. PROCEDURE  An IV tube may be inserted into a vein.  You will be given one of the following:  A medicine that numbs the wrist area (local anesthetic). You may also be given a medicine to make you relax (sedative).  A medicine that makes you go to sleep (general anesthetic).  Your arm, hand, and wrist will be cleaned with a germ-killing solution (antiseptic).  Your surgeon will make a surgical cut (incision) over the palm side of your wrist. The surgeon will pull aside the skin of your wrist to expose the carpal tunnel space.  The surgeon will cut the transverse carpal ligament.  The edges of the incision will be closed with stitches (sutures) or staples.  A bandage (dressing) will be placed over your wrist and wrapped around your hand and wrist. AFTER THE PROCEDURE  You may spend some time in a recovery area.  Your blood pressure, heart rate, breathing rate, and blood oxygen level will be monitored often until the medicines you were given have worn off.  You will likely have some pain. You will be given pain medicine.  You may need to wear a splint or a wrist brace over your dressing.   This information is not intended to replace advice given to you by your health care provider. Make sure you discuss any questions you have with your health care provider.   Document Released: 05/05/2003 Document Revised: 03/05/2014 Document Reviewed: 09/30/2013 Elsevier Interactive Patient Education [employer]

## 2015-09-20 ENCOUNTER — Telehealth: Payer: Self-pay | Admitting: *Deleted

## 2015-09-20 NOTE — Telephone Encounter (Signed)
CALLED [employer] TO OBTAIN PREAUTHORIZATION FOR SURGERY RIGHT CARPAL TUNNEL  NOT REQUIRED PER NISHA L.

## 2015-09-22 ENCOUNTER — Ambulatory Visit: Payer: BC Managed Care – PPO | Admitting: Nurse Practitioner

## 2015-09-22 ENCOUNTER — Encounter: Payer: Self-pay | Admitting: Family Medicine

## 2015-09-23 ENCOUNTER — Encounter (HOSPITAL_COMMUNITY): Payer: Self-pay

## 2015-09-23 ENCOUNTER — Encounter (HOSPITAL_COMMUNITY)
Admission: RE | Admit: 2015-09-23 | Discharge: 2015-09-23 | Disposition: A | Discharge status: Home / Self Care | Payer: BC Managed Care – PPO | Source: Ambulatory Visit | Attending: Orthopedic Surgery | Admitting: Orthopedic Surgery

## 2015-09-23 DIAGNOSIS — Z01812 Encounter for preprocedural laboratory examination: Secondary | ICD-10-CM | POA: Insufficient documentation

## 2015-09-23 HISTORY — DX: Personal history of urinary calculi: Z87.442

## 2015-09-23 LAB — CBC WITH DIFFERENTIAL/PLATELET
Basophils Absolute: 0.1 10*3/uL (ref 0.0–0.1)
Basophils Relative: 1 %
Eosinophils Absolute: 0.2 10*3/uL (ref 0.0–0.7)
Eosinophils Relative: 5 %
HCT: 39.9 % (ref 36.0–46.0)
HEMOGLOBIN: 13.1 g/dL (ref 12.0–15.0)
LYMPHS ABS: 2.2 10*3/uL (ref 0.7–4.0)
LYMPHS PCT: 47 %
MCH: 29.7 pg (ref 26.0–34.0)
MCHC: 32.8 g/dL (ref 30.0–36.0)
MCV: 90.5 fL (ref 78.0–100.0)
MONOS PCT: 8 %
Monocytes Absolute: 0.3 10*3/uL (ref 0.1–1.0)
NEUTROS PCT: 39 %
Neutro Abs: 1.8 10*3/uL (ref 1.7–7.7)
Platelets: 210 10*3/uL (ref 150–400)
RBC: 4.41 MIL/uL (ref 3.87–5.11)
RDW: 12.8 % (ref 11.5–15.5)
WBC: 4.6 10*3/uL (ref 4.0–10.5)

## 2015-09-23 LAB — BASIC METABOLIC PANEL
Anion gap: 6 (ref 5–15)
BUN: 17 mg/dL (ref 6–20)
CHLORIDE: 109 mmol/L (ref 101–111)
CO2: 25 mmol/L (ref 22–32)
Calcium: 9.2 mg/dL (ref 8.9–10.3)
Creatinine, Ser: 0.6 mg/dL (ref 0.44–1.00)
GFR calc Af Amer: >60 mL/min (ref >60)
GFR calc non Af Amer: >60 mL/min (ref >60)
GLUCOSE: 82 mg/dL (ref 65–99)
POTASSIUM: 4.6 mmol/L (ref 3.5–5.1)
Sodium: 140 mmol/L (ref 135–145)

## 2015-09-23 NOTE — Pre-Procedure Instructions (Signed)
Patient given information to sign up for my chart at home. 

## 2015-09-26 ENCOUNTER — Encounter: Payer: Self-pay | Admitting: Family Medicine

## 2015-09-27 NOTE — H&P (Signed)
Erica Graves is an 50 y.o. female.   Chief Complaint: Pain paresthesias right hand HPI: This 50 year old female has a history of long-standing carpal tunnel disease status post left carpal tunnel release in 2016. Paresthesias in the right upper extremity have persisted for several years. She's had several injections in the carpal tunnel with cortisone with good relief for a period of 4-6 months. She did not want have surgery until this summer.  Her preoperative nonoperative treatment included injections carpal tunnel splinting gabapentin and vitamin B 6.    Past Medical History:  Diagnosis Date  . Carpal tunnel syndrome, bilateral   . Chronic low back pain   . Constipation   . Counseling for estrogen replacement therapy 07/07/2014  . Depression with anxiety   . Dyspareunia 07/07/2014  . History of kidney stones   . Hot flashes 07/07/2014  . Menopause 07/07/2014  . Moody 07/07/2014  . Obesity   . Umbilical hernia     Past Surgical History:  Procedure Laterality Date  . ABDOMINAL HYSTERECTOMY    . BACK SURGERY     removal of cyst subdermal  . CARPAL TUNNEL RELEASE Left 09/30/2014   Procedure: CARPAL TUNNEL RELEASE;  Surgeon: Vickki Hearing, MD;  Location: [facility];  Service: Orthopedics;  Laterality: Left;  . COLONOSCOPY N/A 06/29/2013   Procedure: COLONOSCOPY;  Surgeon: West Bali, MD;  [facility];  Service: Endoscopy;  Laterality: N/A;  1:30  . KNEE ARTHROSCOPY Right   . KNEE ARTHROSCOPY  05/21/2011   Procedure: ARTHROSCOPY KNEE;  Surgeon: Vickki Hearing, MD;  Location: [facility];  Service: Orthopedics;  Laterality: Left;  lateral menisectomy  . KNEE CLOSED REDUCTION Left 05/26/2012   Procedure: CLOSED MANIPULATION KNEE;  Surgeon: Vickki Hearing, MD;  Location: [facility];  Service: Orthopedics;  Laterality: Left;  . left knee arthroscopy  08/14/2005 Dr. Romeo Apple torn meniscus  . LUMBAR SPINE SURGERY    . PARTIAL HYSTERECTOMY    . TOTAL KNEE ARTHROPLASTY  03/31/2012    Procedure: TOTAL KNEE ARTHROPLASTY;  Surgeon: Vickki Hearing, MD;  Location: [facility];  Service: Orthopedics;  Laterality: Left;  Left Total Knee Arthroplasty    Family History  Problem Relation Age of Onset  . Heart disease Mother   . Hypertension Mother   . Colon cancer Neg Hx    Social History:  reports that she has never smoked. She has never used smokeless tobacco. She reports that she drinks alcohol. She reports that she does not use drugs.  Allergies:  Allergies  Allergen Reactions  . Norco [Hydrocodone-Acetaminophen] Nausea And Vomiting    No prescriptions prior to admission.    No results found for this or any previous visit (from the past 48 hour(s)). No results found.  Review of Systems  Musculoskeletal: Positive for myalgias.  All other systems reviewed and are negative.   VS BP (!) 160/91   Pulse 66   Temp 97.7 F (36.5 C) (Oral)   Resp 15   Ht  (1.651 m)   Wt 207 lb (93.9 kg)   SpO2 99%   BMI 34.45 kg/m   Physical Exam  Constitutional: She is oriented to person, place, and time. She appears well-nourished.  Eyes: Right eye exhibits no discharge. Left eye exhibits no discharge. No scleral icterus.  Neck: Neck supple. No JVD present. No tracheal deviation present.  Cardiovascular: Intact distal pulses.   Respiratory: Effort normal. No stridor.  GI: Soft. She exhibits no distension.  Neurological: She is alert and oriented to person, place, and time. She has normal reflexes. She exhibits normal muscle tone. Coordination normal.  Skin: Skin is warm and dry. No rash noted. No erythema. No pallor.  Psychiatric: She has a normal mood and affect. Her behavior is normal. Thought content normal.      The carpal tunnel tests included a compression test and Phalen's test which were both positive.  Sensory losses noted in the median nerve distribution of the right upper extremity  No atrophy is noted. Grip strength is normal. Wrist joint  stable.  Wrist and hand range of motion remains normal. Right upper extremity lymph nodes are also normal.     Assessment/Plan Right carpal tunnel syndrome  Right carpal tunnel release  This procedure has been fully reviewed with the patient and written informed consent has been obtained.   Fuller Canada, MD 09/27/2015, 12:57 PM

## 2015-09-28 ENCOUNTER — Ambulatory Visit (HOSPITAL_COMMUNITY): Payer: BC Managed Care – PPO | Admitting: Anesthesiology

## 2015-09-28 ENCOUNTER — Encounter (HOSPITAL_COMMUNITY): Payer: Self-pay | Admitting: Anesthesiology

## 2015-09-28 ENCOUNTER — Ambulatory Visit (HOSPITAL_COMMUNITY)
Admission: RE | Admit: 2015-09-28 | Discharge: 2015-09-28 | Disposition: A | Discharge status: Home / Self Care | Payer: BC Managed Care – PPO | Source: Ambulatory Visit | Attending: Orthopedic Surgery | Admitting: Orthopedic Surgery

## 2015-09-28 ENCOUNTER — Encounter (HOSPITAL_COMMUNITY)
Admission: RE | Disposition: A | Discharge status: Home / Self Care | Payer: Self-pay | Source: Ambulatory Visit | Attending: Orthopedic Surgery

## 2015-09-28 DIAGNOSIS — E669 Obesity, unspecified: Secondary | ICD-10-CM | POA: Insufficient documentation

## 2015-09-28 DIAGNOSIS — G5601 Carpal tunnel syndrome, right upper limb: Secondary | ICD-10-CM | POA: Diagnosis not present

## 2015-09-28 DIAGNOSIS — G709 Myoneural disorder, unspecified: Secondary | ICD-10-CM | POA: Insufficient documentation

## 2015-09-28 DIAGNOSIS — K5900 Constipation, unspecified: Secondary | ICD-10-CM | POA: Diagnosis not present

## 2015-09-28 DIAGNOSIS — G8929 Other chronic pain: Secondary | ICD-10-CM | POA: Diagnosis not present

## 2015-09-28 DIAGNOSIS — M1990 Unspecified osteoarthritis, unspecified site: Secondary | ICD-10-CM | POA: Diagnosis not present

## 2015-09-28 DIAGNOSIS — M545 Low back pain: Secondary | ICD-10-CM | POA: Insufficient documentation

## 2015-09-28 DIAGNOSIS — Z87442 Personal history of urinary calculi: Secondary | ICD-10-CM | POA: Insufficient documentation

## 2015-09-28 DIAGNOSIS — F419 Anxiety disorder, unspecified: Secondary | ICD-10-CM | POA: Insufficient documentation

## 2015-09-28 DIAGNOSIS — F329 Major depressive disorder, single episode, unspecified: Secondary | ICD-10-CM | POA: Diagnosis not present

## 2015-09-28 HISTORY — PX: CARPAL TUNNEL RELEASE: SHX101

## 2015-09-28 SURGERY — CARPAL TUNNEL RELEASE
Anesthesia: Monitor Anesthesia Care | Laterality: Right

## 2015-09-28 MED ORDER — IBUPROFEN 800 MG PO TABS: 800.0000 mg | ORAL_TABLET | Freq: Four times a day (QID) | ORAL | 0 refills | Status: DC | PRN

## 2015-09-28 MED ORDER — ONDANSETRON HCL 4 MG/2ML IJ SOLN
4.0000 mg | Freq: Once | INTRAMUSCULAR | Status: AC
  Administered 2015-09-28: 4 mg via INTRAVENOUS
  Filled 2015-09-28: qty 2

## 2015-09-28 MED ORDER — 0.9 % SODIUM CHLORIDE (POUR BTL) OPTIME
TOPICAL | Status: DC | PRN
  Administered 2015-09-28: 1000 mL

## 2015-09-28 MED ORDER — MIDAZOLAM HCL 2 MG/2ML IJ SOLN
INTRAMUSCULAR | Status: DC | PRN
  Administered 2015-09-28 (×3): 0.5 mg via INTRAVENOUS

## 2015-09-28 MED ORDER — LACTATED RINGERS IV SOLN
INTRAVENOUS | Status: DC
  Administered 2015-09-28: 10:00:00 via INTRAVENOUS

## 2015-09-28 MED ORDER — LIDOCAINE HCL (PF) 1 % IJ SOLN
INTRAMUSCULAR | Status: AC
  Filled 2015-09-28: qty 5

## 2015-09-28 MED ORDER — LIDOCAINE HCL (PF) 0.5 % IJ SOLN
INTRAMUSCULAR | Status: DC | PRN
Start: 2015-09-28 — End: 2015-09-28
  Administered 2015-09-28: 50 mL via INTRAVENOUS

## 2015-09-28 MED ORDER — MIDAZOLAM HCL 2 MG/2ML IJ SOLN
1.0000 mg | INTRAMUSCULAR | Status: DC | PRN
  Administered 2015-09-28: 1 mg via INTRAVENOUS
  Filled 2015-09-28: qty 2

## 2015-09-28 MED ORDER — FENTANYL CITRATE (PF) 100 MCG/2ML IJ SOLN: 25.0000 ug | INTRAMUSCULAR | Status: DC | PRN

## 2015-09-28 MED ORDER — CEFAZOLIN SODIUM-DEXTROSE 2-4 GM/100ML-% IV SOLN
2.0000 g | INTRAVENOUS | Status: AC
  Administered 2015-09-28: 2 g via INTRAVENOUS
  Filled 2015-09-28: qty 100

## 2015-09-28 MED ORDER — FENTANYL CITRATE (PF) 100 MCG/2ML IJ SOLN
INTRAMUSCULAR | Status: DC | PRN
  Administered 2015-09-28: 25 ug via INTRAVENOUS
  Administered 2015-09-28: 50 ug via INTRAVENOUS

## 2015-09-28 MED ORDER — MIDAZOLAM HCL 2 MG/2ML IJ SOLN
INTRAMUSCULAR | Status: AC
  Filled 2015-09-28: qty 2

## 2015-09-28 MED ORDER — PROPOFOL 10 MG/ML IV BOLUS
INTRAVENOUS | Status: AC
  Filled 2015-09-28: qty 20

## 2015-09-28 MED ORDER — BUPIVACAINE HCL (PF) 0.5 % IJ SOLN
INTRAMUSCULAR | Status: DC | PRN
  Administered 2015-09-28: 10 mL

## 2015-09-28 MED ORDER — BUPIVACAINE HCL (PF) 0.5 % IJ SOLN
INTRAMUSCULAR | Status: AC
  Filled 2015-09-28: qty 30

## 2015-09-28 MED ORDER — MIDAZOLAM HCL 5 MG/5ML IJ SOLN
INTRAMUSCULAR | Status: DC | PRN
  Administered 2015-09-28 (×2): 0.5 mg via INTRAVENOUS

## 2015-09-28 MED ORDER — FENTANYL CITRATE (PF) 100 MCG/2ML IJ SOLN
25.0000 ug | INTRAMUSCULAR | Status: DC | PRN
  Administered 2015-09-28: 25 ug via INTRAVENOUS
  Filled 2015-09-28: qty 2

## 2015-09-28 MED ORDER — LIDOCAINE HCL (PF) 0.5 % IJ SOLN
INTRAMUSCULAR | Status: AC
  Filled 2015-09-28: qty 50

## 2015-09-28 MED ORDER — CHLORHEXIDINE GLUCONATE 4 % EX LIQD: 60.0000 mL | Freq: Once | CUTANEOUS | Status: DC

## 2015-09-28 MED ORDER — FENTANYL CITRATE (PF) 100 MCG/2ML IJ SOLN
INTRAMUSCULAR | Status: AC
  Filled 2015-09-28: qty 2

## 2015-09-28 MED ORDER — ONDANSETRON HCL 4 MG/2ML IJ SOLN: 4.0000 mg | Freq: Once | INTRAMUSCULAR | Status: DC | PRN

## 2015-09-28 MED ORDER — DEXAMETHASONE SODIUM PHOSPHATE 4 MG/ML IJ SOLN
4.0000 mg | INTRAMUSCULAR | Status: AC
  Administered 2015-09-28: 4 mg via INTRAVENOUS
  Filled 2015-09-28: qty 1

## 2015-09-28 MED ORDER — LIDOCAINE HCL (CARDIAC) 10 MG/ML IV SOLN
INTRAVENOUS | Status: DC | PRN
  Administered 2015-09-28: 50 mg via INTRAVENOUS

## 2015-09-28 MED ORDER — PROPOFOL 500 MG/50ML IV EMUL
INTRAVENOUS | Status: DC | PRN
  Administered 2015-09-28: 50 ug/kg/min via INTRAVENOUS

## 2015-09-28 MED ORDER — HYDROMORPHONE HCL 2 MG PO TABS: 2.0000 mg | ORAL_TABLET | ORAL | 0 refills | Status: DC | PRN

## 2015-09-28 MED ORDER — PROPOFOL 10 MG/ML IV BOLUS
INTRAVENOUS | Status: DC | PRN
  Administered 2015-09-28: 10 mg via INTRAVENOUS
  Administered 2015-09-28: 20 mg via INTRAVENOUS

## 2015-09-28 SURGICAL SUPPLY — 51 items
BAG HAMPER (MISCELLANEOUS) ×2 IMPLANT
BANDAGE ELASTIC 3 LF NS (GAUZE/BANDAGES/DRESSINGS) ×2 IMPLANT
BANDAGE ESMARK 4X12 BL STRL LF (DISPOSABLE) ×1 IMPLANT
BLADE SURG 15 STRL LF DISP TIS (BLADE) ×1 IMPLANT
BLADE SURG 15 STRL SS (BLADE) ×2
BNDG CMPR 12X4 ELC STRL LF (DISPOSABLE) ×1
BNDG CMPR MD 5X2 ELC HKLP STRL (GAUZE/BANDAGES/DRESSINGS) ×1
BNDG CMPR MED 5X3 ELC HKLP NS (GAUZE/BANDAGES/DRESSINGS) ×1
BNDG COHESIVE 4X5 TAN STRL (GAUZE/BANDAGES/DRESSINGS) ×2 IMPLANT
BNDG ELASTIC 2 VLCR STRL LF (GAUZE/BANDAGES/DRESSINGS) ×1 IMPLANT
BNDG ESMARK 4X12 BLUE STRL LF (DISPOSABLE) ×2
BNDG GAUZE ELAST 4 BULKY (GAUZE/BANDAGES/DRESSINGS) ×1 IMPLANT
CHLORAPREP W/TINT 26ML (MISCELLANEOUS) ×2 IMPLANT
CLOTH BEACON ORANGE TIMEOUT ST (SAFETY) ×2 IMPLANT
COVER LIGHT HANDLE STERIS (MISCELLANEOUS) ×5 IMPLANT
CUFF TOURNIQUET SINGLE 18IN (TOURNIQUET CUFF) ×2 IMPLANT
CUFF TOURNIQUET SINGLE 24IN (TOURNIQUET CUFF) ×1 IMPLANT
DECANTER SPIKE VIAL GLASS SM (MISCELLANEOUS) ×2 IMPLANT
DRAPE PROXIMA HALF (DRAPES) ×2 IMPLANT
DRSG XEROFORM 1X8 (GAUZE/BANDAGES/DRESSINGS) ×1 IMPLANT
ELECT NDL TIP 2.8 STRL (NEEDLE) ×1 IMPLANT
ELECT NEEDLE TIP 2.8 STRL (NEEDLE) ×2 IMPLANT
ELECT REM PT RETURN 9FT ADLT (ELECTROSURGICAL) ×2
ELECTRODE REM PT RTRN 9FT ADLT (ELECTROSURGICAL) ×1 IMPLANT
GAUZE FLUFF 18X24 1PLY STRL (GAUZE/BANDAGES/DRESSINGS) ×1 IMPLANT
GAUZE SPONGE 4X4 12PLY STRL (GAUZE/BANDAGES/DRESSINGS) ×1 IMPLANT
GAUZE XEROFORM 1X8 LF (GAUZE/BANDAGES/DRESSINGS) ×1 IMPLANT
GLOVE BIOGEL PI IND STRL 7.0 (GLOVE) ×1 IMPLANT
GLOVE BIOGEL PI IND STRL 8 (GLOVE) IMPLANT
GLOVE BIOGEL PI INDICATOR 7.0 (GLOVE) ×1
GLOVE BIOGEL PI INDICATOR 8 (GLOVE) ×2
GLOVE ECLIPSE 6.5 STRL STRAW (GLOVE) ×1 IMPLANT
GLOVE INDICATOR 7.0 STRL GRN (GLOVE) ×1 IMPLANT
GLOVE SKINSENSE NS SZ8.0 LF (GLOVE) ×1
GLOVE SKINSENSE STRL SZ8.0 LF (GLOVE) ×1 IMPLANT
GLOVE SS N UNI LF 8.5 STRL (GLOVE) ×2 IMPLANT
GOWN STRL REUS W/ TWL LRG LVL3 (GOWN DISPOSABLE) ×1 IMPLANT
GOWN STRL REUS W/TWL LRG LVL3 (GOWN DISPOSABLE) ×2
GOWN STRL REUS W/TWL XL LVL3 (GOWN DISPOSABLE) ×2 IMPLANT
HAND ALUMI XLG (SOFTGOODS) ×2 IMPLANT
HEMOSTAT SURGICEL 4X8 (HEMOSTASIS) ×1 IMPLANT
KIT ROOM TURNOVER APOR (KITS) ×2 IMPLANT
MANIFOLD NEPTUNE II (INSTRUMENTS) ×2 IMPLANT
NDL HYPO 21X1.5 SAFETY (NEEDLE) ×1 IMPLANT
NEEDLE HYPO 21X1.5 SAFETY (NEEDLE) ×2 IMPLANT
NS IRRIG 1000ML POUR BTL (IV SOLUTION) ×2 IMPLANT
PACK BASIC LIMB (CUSTOM PROCEDURE TRAY) ×2 IMPLANT
PAD ARMBOARD 7.5X6 YLW CONV (MISCELLANEOUS) ×2 IMPLANT
SET BASIN LINEN APH (SET/KITS/TRAYS/PACK) ×2 IMPLANT
SUT ETHILON 3 0 FSL (SUTURE) ×2 IMPLANT
SYR CONTROL 10ML LL (SYRINGE) ×2 IMPLANT

## 2015-09-28 NOTE — Discharge Instructions (Signed)
Carpal Tunnel Release, Care After Refer to this sheet in the next few weeks. These instructions provide you with information about caring for yourself after your procedure. Your health care provider may also give you more specific instructions. Your treatment has been planned according to current medical practices, but problems sometimes occur. Call your health care provider if you have any problems or questions after your procedure. WHAT TO EXPECT AFTER THE PROCEDURE After your procedure, it is typical to have the following:  Pain.  Numbness.  Tingling.  Swelling.  Stiffness.  Bruising. HOME CARE INSTRUCTIONS  Take medicines only as directed by your health care provider.  There are many different ways to close and cover an incision, including stitches (sutures), skin glue, and adhesive strips. Follow your health care provider's instructions about:  Incision care.  Bandage (dressing) changes and removal.  Incision closure removal.  Wear a splint or a brace as directed by your surgeon. You may need to do this for 2-3 weeks.  Keep your hand raised (elevated) above the level of your heart while you are resting. Move your fingers often.  Avoid activities that cause hand pain.  Ask your surgeon when you can start to do all of your usual activities again, such as:  Driving.  Returning to work.  Bathing and swimming.  Keep all follow-up visits as directed by your health care provider. This is important. You may need physical therapy for several months to speed healing and regain movement. SEEK MEDICAL CARE IF:  You have drainage, redness, swelling, or pain at your incision site.  You have a fever.  You have chills.  Your pain medicine is not working.  Your symptoms do not go away after 2 months.  Your symptoms go away and then return. SEEK IMMEDIATE MEDICAL CARE IF:  You have pain or numbness that is getting worse.  Your fingers change color.  You are not able  to move your fingers.   This information is not intended to replace advice given to you by your health care provider. Make sure you discuss any questions you have with your health care provider.   Document Released: 09/01/2004 Document Revised: 03/05/2014 Document Reviewed: 09/30/2013 Elsevier Interactive Patient Education 2016 Else[employer] Inc[employer]  PATIENT INSTRUCTIONS POST-ANESTHESIA  IMMEDIATELY FOLLOWING SURGERY:  Do not drive or operate machinery for the first twenty four hours after surgery.  Do not make any important decisions for twenty four hours after surgery or while taking narcotic pain medications or sedatives.  If you develop intractable nausea and vomiting or a severe headache please notify your doctor immediately.  FOLLOW-UP:  Please make an appointment with your surgeon as instructed. You do not need to follow up with anesthesia unless specifically instructed to do so.  WOUND CARE INSTRUCTIONS (if applicable):  Keep a dry clean dressing on the anesthesia/puncture wound site if there is drainage.  Once the wound has quit draining you may leave it open to air.  Generally you should leave the bandage intact for twenty four hours unless there is drainage.  If the epidural site drains for more than 36-48 hours please call the anesthesia department.  QUESTIONS?:  Please feel free to call your physician or the hospital operator if you have any questions, and they will be happy to assist you.

## 2015-09-28 NOTE — Anesthesia Procedure Notes (Signed)
Anesthesia Regional Block:  Bier block (IV Regional)  Pre-Anesthetic Checklist: ,, timeout performed, Correct Patient, Correct Site, Correct Laterality, Correct Procedure,, site marked, surgical consent,, at surgeon's request  Laterality: Right     Needles:  Injection technique: Single-shot  Needle Type: Other      Needle Gauge: 22 and 22 G    Additional Needles: Bier block (IV Regional)  Nerve Stimulator or Paresthesia:   Additional Responses:  Pulse checked pre and  post tourniquet inflation. IV NSL discontinued post injection. Narrative:  Start time: 09/28/2015 10:46 AM End time: 09/28/2015 10:48 AM  Performed by: Personally

## 2015-09-28 NOTE — Brief Op Note (Addendum)
09/28/2015  11:30 AM  PATIENT:  Erica Graves  50 y.o. female  PRE-OPERATIVE DIAGNOSIS:  RIGHT CARPAL TUNNEL SYNDROME  POST-OPERATIVE DIAGNOSIS:  RIGHT CARPAL TUNNEL SYNDROME  PROCEDURE:  Procedure(s): CARPAL TUNNEL RELEASE (Right)  SURGEON:  Surgeon(s) and Role:    * Diora Bellizzi E Kylian Loh, MD - Primary  PHYSICIAN ASSISTANT:   ASSISTANTS: none   ANESTHESIA:   regional  EBL:  Total I/O In: 600 [I.V.:600] Out: 0   BLOOD ADMINISTERED:none  DRAINS: none   LOCAL MEDICATIONS USED:  MARCAINE    and Amount: 10 ml  SPECIMEN:  No Specimen  DISPOSITION OF SPECIMEN:  N/A  COUNTS:  YES  TOURNIQUET:  * Missing tourniquet times found for documented tourniquets in log:  [id number] *  DICTATION: .Dragon Dictation  PLAN OF CARE: Discharge to home after PACU  PATIENT DISPOSITION:  PACU - hemodynamically stable.   Delay start of Pharmacological VTE agent (>24hrs) due to surgical blood loss or risk of bleeding: not applicable  Carpal tunnel release right wrist  Preop diagnosis carpal tunnel syndrome right wrist postop diagnosis same  Procedure open carpal tunnel release right wrist  Surgeon Florean Hoobler  Anesthesia regional Bier block  Operative findings compression of the right median nerveoccupied lesions  Indications failure of conservative treatment to relieve pain and paresthesias and numbness and tingling of the right hand.  The patient was identified in the preop area we confirm the surgical site marked as right wrist. Chart update completed. Patient taken to surgery. She had 2 g of Ancef. After establishing a Bier block her arm was prepped with ChloraPrep.  Timeout executed completed and confirmed site.  A straight incision was made over the carpal tunnel in line with the radial border of the ring finger. Blunt dissection was carried out to find the distal aspect of the carpal tunnel. A blunted judgment was passed beneath the carpal tunnel. Sharp incision was then used to  release the transverse carpal ligament. The contents of the carpal tunnel were inspected. The median nerve was severely compressed flat discolored and the carpal tunnel was very tight  The wound was irrigated and then closed with 3-0 nylon suture. We injected 10 mL of plain Marcaine on the radial side of the incision  A sterile bandage was applied and the tourniquet was released the color of the hand and capillary refill were normal  The patient was taken to the recovery room in stable condition 

## 2015-09-28 NOTE — Anesthesia Preprocedure Evaluation (Signed)
Anesthesia Evaluation  Patient identified by MRN, date of birth, ID band Patient awake    Reviewed: Allergy & Precautions, H&P , NPO status , Patient's Chart, lab work & pertinent test results  History of Anesthesia Complications Negative for: history of anesthetic complications  Airway Mallampati: II       Dental  (+) Teeth Intact   Pulmonary    breath sounds clear to auscultation       Cardiovascular negative cardio ROS   Rhythm:Regular Rate:Normal     Neuro/Psych  Headaches, PSYCHIATRIC DISORDERS Depression  Neuromuscular disease    GI/Hepatic negative GI ROS, Neg liver ROS,   Endo/Other  negative endocrine ROS  Renal/GU Renal disease     Musculoskeletal  (+) Arthritis  (chronic LBP),   Abdominal (+) + obese,  Abdomen: soft.    Peds  Hematology negative hematology ROS (+)   Anesthesia Other Findings   Reproductive/Obstetrics                             Anesthesia Physical Anesthesia Plan  ASA: II  Anesthesia Plan: MAC and Bier Block   Post-op Pain Management:    Induction: Intravenous  Airway Management Planned: Simple Face Mask  Additional Equipment:   Intra-op Plan:   Post-operative Plan:   Informed Consent: I have reviewed the patients History and Physical, chart, labs and discussed the procedure including the risks, benefits and alternatives for the proposed anesthesia with the patient or authorized representative who has indicated his/her understanding and acceptance.     Plan Discussed with:   Anesthesia Plan Comments:         Anesthesia Quick Evaluation

## 2015-09-28 NOTE — Transfer of Care (Signed)
Immediate Anesthesia Transfer of Care Note  Patient: Erica Graves  Procedure(s) Performed: Procedure(s): CARPAL TUNNEL RELEASE (Right)  Patient Location: PACU  Anesthesia Type:MAC  Level of Consciousness: awake and patient cooperative  Airway & Oxygen Therapy: Patient Spontanous Breathing and Patient connected to nasal cannula oxygen  Post-op Assessment: Report given to RN, Post -op Vital signs reviewed and stable and Patient moving all extremities  Post vital signs: Reviewed and stable  Last Vitals:  Vitals:   09/28/15 1020 09/28/15 1025  BP: (!) 162/87 (!) 184/91  Pulse:    Resp: 15 (!) 26  Temp:      Last Pain:  Vitals:   09/28/15 0848  TempSrc: Oral         Complications: No apparent anesthesia complications

## 2015-09-28 NOTE — Interval H&P Note (Signed)
History and Physical Interval Note:  09/28/2015 10:24 AM  Erica Graves  has presented today for surgery, with the diagnosis of RIGHT CARPAL TUNNEL SYNDROME  The various methods of treatment have been discussed with the patient and family. After consideration of risks, benefits and other options for treatment, the patient has consented to  Procedure(s): CARPAL TUNNEL RELEASE (Right) as a surgical intervention .  The patient's history has been reviewed, patient examined, no change in status, stable for surgery.  I have reviewed the patient's chart and labs.  Questions were answered to the patient's satisfaction.     Fuller Canada

## 2015-09-28 NOTE — Anesthesia Procedure Notes (Signed)
Procedure Name: MAC Date/Time: 09/28/2015 10:29 AM Performed by: Franco Nones Pre-anesthesia Checklist: Patient identified, Emergency Drugs available, Suction available, Timeout performed and Patient being monitored Patient Re-evaluated:Patient Re-evaluated prior to inductionOxygen Delivery Method: Nasal Cannula

## 2015-09-28 NOTE — Anesthesia Postprocedure Evaluation (Signed)
Anesthesia Post Note  Patient: Erica Graves  Procedure(s) Performed: Procedure(s) (LRB): CARPAL TUNNEL RELEASE (Right)  Patient location during evaluation: PACU Anesthesia Type: MAC Level of consciousness: awake Pain management: pain level controlled Vital Signs Assessment: post-procedure vital signs reviewed and stable Respiratory status: spontaneous breathing Cardiovascular status: unstable Anesthetic complications: no    Last Vitals:  Vitals:   09/28/15 1025 09/28/15 1145  BP: (!) 184/91 (!) 180/86  Pulse:  70  Resp: (!) 26 12  Temp:  36.4 C    Last Pain:  Vitals:   09/28/15 0848  TempSrc: Oral                 Shazia Mitchener

## 2015-09-28 NOTE — Op Note (Signed)
09/28/2015  11:30 AM  PATIENT:  Erica Graves  50 y.o. female  PRE-OPERATIVE DIAGNOSIS:  RIGHT CARPAL TUNNEL SYNDROME  POST-OPERATIVE DIAGNOSIS:  RIGHT CARPAL TUNNEL SYNDROME  PROCEDURE:  Procedure(s): CARPAL TUNNEL RELEASE (Right)  SURGEON:  Surgeon(s) and Role:    * Vickki Hearing, MD - Primary  PHYSICIAN ASSISTANT:   ASSISTANTS: none   ANESTHESIA:   regional  EBL:  Total I/O In: 600 [I.V.:600] Out: 0   BLOOD ADMINISTERED:none  DRAINS: none   LOCAL MEDICATIONS USED:  MARCAINE    and Amount: 10 ml  SPECIMEN:  No Specimen  DISPOSITION OF SPECIMEN:  N/A  COUNTS:  YES  TOURNIQUET:  * Missing tourniquet times found for documented tourniquets in log:  [id number] *  DICTATION: .Dragon Dictation  PLAN OF CARE: Discharge to home after PACU  PATIENT DISPOSITION:  PACU - hemodynamically stable.   Delay start of Pharmacological VTE agent (>24hrs) due to surgical blood loss or risk of bleeding: not applicable  Carpal tunnel release right wrist  Preop diagnosis carpal tunnel syndrome right wrist postop diagnosis same  Procedure open carpal tunnel release right wrist  Surgeon Romeo Apple  Anesthesia regional Bier block  Operative findings compression of the right median nerveoccupied lesions  Indications failure of conservative treatment to relieve pain and paresthesias and numbness and tingling of the right hand.  The patient was identified in the preop area we confirm the surgical site marked as right wrist. Chart update completed. Patient taken to surgery. She had 2 g of Ancef. After establishing a Bier block her arm was prepped with ChloraPrep.  Timeout executed completed and confirmed site.  A straight incision was made over the carpal tunnel in line with the radial border of the ring finger. Blunt dissection was carried out to find the distal aspect of the carpal tunnel. A blunted judgment was passed beneath the carpal tunnel. Sharp incision was then used to  release the transverse carpal ligament. The contents of the carpal tunnel were inspected. The median nerve was severely compressed flat discolored and the carpal tunnel was very tight  The wound was irrigated and then closed with 3-0 nylon suture. We injected 10 mL of plain Marcaine on the radial side of the incision  A sterile bandage was applied and the tourniquet was released the color of the hand and capillary refill were normal  The patient was taken to the recovery room in stable condition

## 2015-10-03 ENCOUNTER — Ambulatory Visit (INDEPENDENT_AMBULATORY_CARE_PROVIDER_SITE_OTHER): Payer: BC Managed Care – PPO | Admitting: Orthopedic Surgery

## 2015-10-03 DIAGNOSIS — G5601 Carpal tunnel syndrome, right upper limb: Secondary | ICD-10-CM

## 2015-10-03 DIAGNOSIS — Z4789 Encounter for other orthopedic aftercare: Secondary | ICD-10-CM

## 2015-10-03 NOTE — Progress Notes (Signed)
Patient ID: Erica HurdleJanice M Graves, female   DOB: [birth year], 50 y.o.   MRN: [id number]  Postop right carpal tunnel release August 2   Dressing change wound clean numbness and tingling improved  Redress the wound okay to shower with plastic dressing    ASSESSMENT AND PLAN   Stable postoperative carpal tunnel release right wrist sutures out next visit

## 2015-10-06 ENCOUNTER — Encounter (HOSPITAL_COMMUNITY): Payer: Self-pay | Admitting: Orthopedic Surgery

## 2015-10-12 ENCOUNTER — Ambulatory Visit (INDEPENDENT_AMBULATORY_CARE_PROVIDER_SITE_OTHER): Payer: BC Managed Care – PPO | Admitting: Orthopedic Surgery

## 2015-10-12 ENCOUNTER — Encounter: Payer: Self-pay | Admitting: Orthopedic Surgery

## 2015-10-12 VITALS — BP 147/86 | Ht 64.0 in | Wt 204.0 lb

## 2015-10-12 DIAGNOSIS — Z4789 Encounter for other orthopedic aftercare: Secondary | ICD-10-CM

## 2015-10-12 NOTE — Progress Notes (Signed)
Postop status post carpal tunnel release right wrist hand  Chief Complaint  Patient presents with  . Follow-up    POST OP 2, REMOVE STITCHES, DOS 09/28/15    Sutures are removed patient is asymptomatic she has full range of motion and she has been released for the carpal tunnel

## 2015-10-12 NOTE — Patient Instructions (Signed)
Remove strips in 7 days

## 2015-10-20 ENCOUNTER — Ambulatory Visit: Payer: Self-pay | Admitting: Orthopedic Surgery

## 2015-11-18 ENCOUNTER — Encounter: Payer: Self-pay | Admitting: Nurse Practitioner

## 2015-11-18 ENCOUNTER — Ambulatory Visit (INDEPENDENT_AMBULATORY_CARE_PROVIDER_SITE_OTHER): Payer: BC Managed Care – PPO | Admitting: Nurse Practitioner

## 2015-11-18 VITALS — BP 160/100 | Temp 98.1°F | Ht 65.0 in | Wt 207.0 lb

## 2015-11-18 DIAGNOSIS — F419 Anxiety disorder, unspecified: Secondary | ICD-10-CM | POA: Diagnosis not present

## 2015-11-18 DIAGNOSIS — I10 Essential (primary) hypertension: Secondary | ICD-10-CM | POA: Diagnosis not present

## 2015-11-18 DIAGNOSIS — F5105 Insomnia due to other mental disorder: Secondary | ICD-10-CM | POA: Diagnosis not present

## 2015-11-18 MED ORDER — METOPROLOL SUCCINATE ER 25 MG PO TB24: 25.0000 mg | ORAL_TABLET | Freq: Every day | ORAL | 2 refills | Status: DC

## 2015-11-18 MED ORDER — ALPRAZOLAM 0.5 MG PO TABS: 0.5000 mg | ORAL_TABLET | Freq: Every evening | ORAL | 2 refills | Status: DC | PRN

## 2015-11-18 NOTE — Patient Instructions (Signed)
DASH Eating Plan  DASH stands for "Dietary Approaches to Stop Hypertension." The DASH eating plan is a healthy eating plan that has been shown to reduce high blood pressure (hypertension). Additional health benefits may include reducing the risk of type 2 diabetes mellitus, heart disease, and stroke. The DASH eating plan may also help with weight loss.  WHAT DO I NEED TO KNOW ABOUT THE DASH EATING PLAN?  For the DASH eating plan, you will follow these general guidelines:  · Choose foods with a percent daily value for sodium of less than 5% (as listed on the food label).  · Use salt-free seasonings or herbs instead of table salt or sea salt.  · Check with your health care provider or pharmacist before using salt substitutes.  · Eat lower-sodium products, often labeled as "lower sodium" or "no salt added."  · Eat fresh foods.  · Eat more vegetables, fruits, and low-fat dairy products.  · Choose whole grains. Look for the word "whole" as the first word in the ingredient list.  · Choose fish and skinless chicken or turkey more often than red meat. Limit fish, poultry, and meat to 6 oz (170 g) each day.  · Limit sweets, desserts, sugars, and sugary drinks.  · Choose heart-healthy fats.  · Limit cheese to 1 oz (28 g) per day.  · Eat more home-cooked food and less restaurant, buffet, and fast food.  · Limit fried foods.  · Cook foods using methods other than frying.  · Limit canned vegetables. If you do use them, rinse them well to decrease the sodium.  · When eating at a restaurant, ask that your food be prepared with less salt, or no salt if possible.  WHAT FOODS CAN I EAT?  Seek help from a dietitian for individual calorie needs.  Grains  Whole grain or whole wheat bread. Brown rice. Whole grain or whole wheat pasta. Quinoa, bulgur, and whole grain cereals. Low-sodium cereals. Corn or whole wheat flour tortillas. Whole grain cornbread. Whole grain crackers. Low-sodium crackers.  Vegetables  Fresh or frozen vegetables  (raw, steamed, roasted, or grilled). Low-sodium or reduced-sodium tomato and vegetable juices. Low-sodium or reduced-sodium tomato sauce and paste. Low-sodium or reduced-sodium canned vegetables.   Fruits  All fresh, canned (in natural juice), or frozen fruits.  Meat and Other Protein Products  Ground beef (85% or leaner), grass-fed beef, or beef trimmed of fat. Skinless chicken or turkey. Ground chicken or turkey. Pork trimmed of fat. All fish and seafood. Eggs. Dried beans, peas, or lentils. Unsalted nuts and seeds. Unsalted canned beans.  Dairy  Low-fat dairy products, such as skim or 1% milk, 2% or reduced-fat cheeses, low-fat ricotta or cottage cheese, or plain low-fat yogurt. Low-sodium or reduced-sodium cheeses.  Fats and Oils  Tub margarines without trans fats. Light or reduced-fat mayonnaise and salad dressings (reduced sodium). Avocado. Safflower, olive, or canola oils. Natural peanut or almond butter.  Other  Unsalted popcorn and pretzels.  The items listed above may not be a complete list of recommended foods or beverages. Contact your dietitian for more options.  WHAT FOODS ARE NOT RECOMMENDED?  Grains  White bread. White pasta. White rice. Refined cornbread. Bagels and croissants. Crackers that contain trans fat.  Vegetables  Creamed or fried vegetables. Vegetables in a cheese sauce. Regular canned vegetables. Regular canned tomato sauce and paste. Regular tomato and vegetable juices.  Fruits  Dried fruits. Canned fruit in light or heavy syrup. Fruit juice.  Meat and Other Protein   Products  Fatty cuts of meat. Ribs, chicken wings, bacon, sausage, bologna, salami, chitterlings, fatback, hot dogs, bratwurst, and packaged luncheon meats. Salted nuts and seeds. Canned beans with salt.  Dairy  Whole or 2% milk, cream, half-and-half, and cream cheese. Whole-fat or sweetened yogurt. Full-fat cheeses or blue cheese. Nondairy creamers and whipped toppings. Processed cheese, cheese spreads, or cheese  curds.  Condiments  Onion and garlic salt, seasoned salt, table salt, and sea salt. Canned and packaged gravies. Worcestershire sauce. Tartar sauce. Barbecue sauce. Teriyaki sauce. Soy sauce, including reduced sodium. Steak sauce. Fish sauce. Oyster sauce. Cocktail sauce. Horseradish. Ketchup and mustard. Meat flavorings and tenderizers. Bouillon cubes. Hot sauce. Tabasco sauce. Marinades. Taco seasonings. Relishes.  Fats and Oils  Butter, stick margarine, lard, shortening, ghee, and bacon fat. Coconut, palm kernel, or palm oils. Regular salad dressings.  Other  Pickles and olives. Salted popcorn and pretzels.  The items listed above may not be a complete list of foods and beverages to avoid. Contact your dietitian for more information.  WHERE CAN I FIND MORE INFORMATION?  National Heart, [employer], [employer] Institute: www.nhlbi.nih.gov/health/health-topics/topics/dash/     This information is not intended to replace advice given to you by your health care provider. Make sure you discuss any questions you have with your health care provider.     Document Released: 02/01/2011 Document Revised: 03/05/2014 Document Reviewed: 12/17/2012  Elsevier Interactive Patient Education ©2016 Elsevier Inc.

## 2015-11-19 ENCOUNTER — Encounter: Payer: Self-pay | Admitting: Nurse Practitioner

## 2015-11-19 NOTE — Progress Notes (Signed)
Subjective:  Presents for complaints of generalized fatigue for the past 2 weeks. Works with the school system on day shift. No increased work hours. Early morning awakenings, goes to bed around 10 PM wakes up about 2 AM and cannot go back to sleep, averages about 4 hours sleep per night. Has been under tremendous stress working and taking care of her mother who lives with her. Began having some dizzy spells about a week ago, worse today was 5 days ago, had to lay down most of the day has greatly improved since then. With standing felt the room moving at times. No difficulty speaking or swallowing. No numbness or weakness of the face arms or legs. No chest pain/ischemic type pain shortness of breath or edema. Occasional mild palpitations but brief. Taking her vitamin D at least 800 units per day. Mild frontal area headache. No fever cough ear pain or sore throat. No vomiting diarrhea or abdominal pain. No urinary symptoms.  Objective:   BP (!) 160/100   Temp 98.1 F (36.7 C) (Oral)   Ht '5\' 5"'  (1.651 m)   Wt 207 lb (93.9 kg)   BMI 34.45 kg/m  NAD. Alert, oriented. TMs mild clear effusion, no erythema. Pharynx clear. Nasal mucosa pale and mildly boggy. Lungs clear. Heart regular rate rhythm. BP on recheck right arm sitting 160/100 standing and sitting 162/96. EOMs intact without nystagmus. Pupils equal and reactive to light. Point-to-point localization normal limit. Reflexes normal limit. Hand strength 5+ bilateral. Romberg negative. Gait normal limit. A review of the chart shows patient has had multiple lab work done over the past few months including met 7 CBC with differential cardiac enzymes thyroid test and glucose testing. Last EKG was done in March which was normal.  Assessment:  Problem List Items Addressed This Visit      Cardiovascular and Mediastinum   Essential hypertension, benign - Primary   Relevant Medications   metoprolol succinate (TOPROL-XL) 25 MG 24 hr tablet    Other Visit  Diagnoses    Insomnia secondary to anxiety       Relevant Medications   ALPRAZolam (XANAX) 0.5 MG tablet     Plan:  Meds ordered this encounter  Medications  . metoprolol succinate (TOPROL-XL) 25 MG 24 hr tablet    Sig: Take 1 tablet (25 mg total) by mouth daily.    Dispense:  30 tablet    Refill:  2    Order Specific Question:   Supervising Provider    Answer:   Mikey Kirschner [[id number]]  . ALPRAZolam (XANAX) 0.5 MG tablet    Sig: Take 1 tablet (0.5 mg total) by mouth at bedtime as needed for sleep.    Dispense:  30 tablet    Refill:  2    Order Specific Question:   Supervising Provider    Answer:   Maggie Font   Lengthy discussion regarding the importance of stress reduction and adequate rest. Xanax at bedtime for sleep. Start low-dose metoprolol daily as directed. Encourage weight loss regular activity and low-sodium diet. Monitor BP outside office and call back if no improvement. Recheck in 4-6 weeks, call back sooner if any problems.

## 2015-12-14 ENCOUNTER — Other Ambulatory Visit: Payer: Self-pay | Admitting: Family Medicine

## 2015-12-14 DIAGNOSIS — Z1231 Encounter for screening mammogram for malignant neoplasm of breast: Secondary | ICD-10-CM

## 2015-12-23 ENCOUNTER — Ambulatory Visit: Payer: BC Managed Care – PPO | Admitting: Family Medicine

## 2015-12-26 ENCOUNTER — Ambulatory Visit (HOSPITAL_COMMUNITY)
Admission: RE | Admit: 2015-12-26 | Discharge: 2015-12-26 | Disposition: A | Discharge status: Home / Self Care | Payer: BC Managed Care – PPO | Source: Ambulatory Visit | Attending: Family Medicine | Admitting: Family Medicine

## 2015-12-26 DIAGNOSIS — Z1231 Encounter for screening mammogram for malignant neoplasm of breast: Secondary | ICD-10-CM | POA: Insufficient documentation

## 2016-01-04 ENCOUNTER — Ambulatory Visit (INDEPENDENT_AMBULATORY_CARE_PROVIDER_SITE_OTHER): Payer: BC Managed Care – PPO | Admitting: Nurse Practitioner

## 2016-01-04 ENCOUNTER — Encounter: Payer: Self-pay | Admitting: Nurse Practitioner

## 2016-01-04 VITALS — BP 138/88 | Ht 65.0 in | Wt 207.5 lb

## 2016-01-04 DIAGNOSIS — I10 Essential (primary) hypertension: Secondary | ICD-10-CM

## 2016-01-04 DIAGNOSIS — R300 Dysuria: Secondary | ICD-10-CM

## 2016-01-04 DIAGNOSIS — G43009 Migraine without aura, not intractable, without status migrainosus: Secondary | ICD-10-CM

## 2016-01-04 DIAGNOSIS — Z23 Encounter for immunization: Secondary | ICD-10-CM | POA: Diagnosis not present

## 2016-01-04 LAB — POCT URINALYSIS DIPSTICK
SPEC GRAV UA: 1.02
pH, UA: 5

## 2016-01-04 MED ORDER — CIPROFLOXACIN HCL 500 MG PO TABS: 500.0000 mg | ORAL_TABLET | Freq: Two times a day (BID) | ORAL | 0 refills | Status: DC

## 2016-01-06 ENCOUNTER — Encounter: Payer: Self-pay | Admitting: Nurse Practitioner

## 2016-01-06 LAB — URINE CULTURE

## 2016-01-06 NOTE — Progress Notes (Signed)
Subjective:  Presents for recheck on hypertension. No chest pain/ischemic type pain or shortness of breath. No migraines lately. Has an active job. Trying to eat healthy. No edema. Also complaints of urinary symptoms that began 2 days ago. Dysuria. Urgency. Voiding small amounts. No fever. Some relief with AZO. No history of recent UTI. Seen sexual partner. No vaginal discharge. No nausea vomiting. Mild low back pain. Has been sleeping much better lately.  Objective:   BP 138/88   Ht 5\' 5"  (1.651 m)   Wt 207 lb 8 oz (94.1 kg)   BMI 34.53 kg/m  NAD. Alert, oriented. Lungs clear. Heart regular rate rhythm. No CVA or flank tenderness. Abdomen soft nondistended nontender. Results for orders placed or performed in visit on 01/04/16  POCT urinalysis dipstick  Result Value Ref Range   Color, UA     Clarity, UA     Glucose, UA     Bilirubin, UA     Ketones, UA     Spec Grav, UA 1.020    Blood, UA     pH, UA 5.0    Protein, UA     Urobilinogen, UA     Nitrite, UA     Leukocytes, UA moderate (2+) (A) Negative     Assessment:  Problem List Items Addressed This Visit      Cardiovascular and Mediastinum   Essential hypertension, benign - Primary   Migraine headache without aura    Other Visit Diagnoses    Need for vaccination       Relevant Orders   Flu Vaccine QUAD 36+ mos PF IM (Fluarix & Fluzone Quad PF) (Completed)   Dysuria       Relevant Orders   POCT urinalysis dipstick (Completed)   Urine culture     Plan:  Meds ordered this encounter  Medications  . ciprofloxacin (CIPRO) 500 MG tablet    Sig: Take 1 tablet (500 mg total) by mouth 2 (two) times daily.    Dispense:  14 tablet    Refill:  0    Order Specific Question:   Supervising Provider    Answer:   Merlyn AlbertLUKING, WILLIAM S [[id number]]   Start Cipro as directed. Urine culture pending. Warning signs reviewed. Call back in 48 hours if no improvement, sooner if worse. Continue to work on weight loss and healthy diet. Return in  about 6 months (around 07/03/2016) for physical.

## 2016-02-17 ENCOUNTER — Ambulatory Visit (INDEPENDENT_AMBULATORY_CARE_PROVIDER_SITE_OTHER): Payer: BC Managed Care – PPO | Admitting: Nurse Practitioner

## 2016-02-17 ENCOUNTER — Encounter: Payer: Self-pay | Admitting: Nurse Practitioner

## 2016-02-17 VITALS — BP 138/90 | Temp 98.2°F | Ht 65.0 in | Wt 207.0 lb

## 2016-02-17 DIAGNOSIS — H60391 Other infective otitis externa, right ear: Secondary | ICD-10-CM | POA: Diagnosis not present

## 2016-02-17 DIAGNOSIS — M5441 Lumbago with sciatica, right side: Secondary | ICD-10-CM | POA: Diagnosis not present

## 2016-02-17 MED ORDER — METHOCARBAMOL 500 MG PO TABS: 500.0000 mg | ORAL_TABLET | Freq: Three times a day (TID) | ORAL | 0 refills | Status: DC | PRN

## 2016-02-17 MED ORDER — MELOXICAM 15 MG PO TABS: 15.0000 mg | ORAL_TABLET | Freq: Every day | ORAL | 2 refills | Status: DC

## 2016-02-17 MED ORDER — CEPHALEXIN 500 MG PO CAPS: 500.0000 mg | ORAL_CAPSULE | Freq: Three times a day (TID) | ORAL | 0 refills | Status: DC

## 2016-02-17 NOTE — Progress Notes (Signed)
Subjective:  Presents for complaints of right low back pain going into the buttock down the side of the leg to the lower calf area for the past 2-3 weeks. No specific history of injury. Has a job requiring frequent lifting. No change in bowel or bladder habits. Last seen by her neurosurgeon Dr. Franky Machoabbell for CT scan and myelogram in 2012. Has had problems off and on since then. At end of visit mentions some swelling and tenderness at the right earlobe after using some cheap earrings. Mildly tender. Started over week ago. No fever.  Objective:   BP 138/90   Temp 98.2 F (36.8 C) (Oral)   Ht 5\' 5"  (1.651 m)   Wt 207 lb (93.9 kg)   BMI 34.45 kg/m  NAD. Alert, oriented. Lungs clear. Heart regular rhythm. Tenderness in the lumbar area bilateral more on the right. SLR slightly positive on the right negative on the left. Reflexes normal limit lower extremities. Limited range of motion especially with lateral movements, rotation and hyperextension due to tenderness. Gait normal limit. Small firm slightly tender nodule noted in the right earlobe at the site of her ear piercing.  Assessment: Acute right-sided low back pain with right-sided sciatica  Infection of right ear lobe   Plan:  Meds ordered this encounter  Medications  . meloxicam (MOBIC) 15 MG tablet    Sig: Take 1 tablet (15 mg total) by mouth daily.    Dispense:  30 tablet    Refill:  2    Order Specific Question:   Supervising Provider    Answer:   Merlyn AlbertLUKING, WILLIAM S [[id number]]  . methocarbamol (ROBAXIN) 500 MG tablet    Sig: Take 1 tablet (500 mg total) by mouth every 8 (eight) hours as needed for muscle spasms.    Dispense:  30 tablet    Refill:  0    Order Specific Question:   Supervising Provider    Answer:   Merlyn AlbertLUKING, WILLIAM S [[id number]]  . cephALEXin (KEFLEX) 500 MG capsule    Sig: Take 1 capsule (500 mg total) by mouth 3 (three) times daily.    Dispense:  21 capsule    Refill:  0    Order Specific Question:   Supervising Provider   Answer:   Merlyn AlbertLUKING, WILLIAM S [[id number]]   Start meloxicam and Robaxin as directed. Ice/heat applications to low back area. Given copy of low back exercises. Discussed options including referral back to neurosurgeon or physical therapy. Patient wishes to try conservative therapy for the next couple weeks. Call back at that time if no improvement. Also call back if ear infection persists.

## 2016-03-06 ENCOUNTER — Other Ambulatory Visit: Payer: Self-pay | Admitting: Nurse Practitioner

## 2016-03-23 ENCOUNTER — Ambulatory Visit: Payer: Self-pay | Admitting: Orthopedic Surgery

## 2016-03-27 ENCOUNTER — Ambulatory Visit: Payer: Self-pay | Admitting: Orthopedic Surgery

## 2016-04-02 ENCOUNTER — Encounter: Payer: Self-pay | Admitting: Orthopedic Surgery

## 2016-04-02 ENCOUNTER — Ambulatory Visit (INDEPENDENT_AMBULATORY_CARE_PROVIDER_SITE_OTHER): Payer: BC Managed Care – PPO | Admitting: Orthopedic Surgery

## 2016-04-02 VITALS — BP 150/89 | HR 68 | Wt 205.0 lb

## 2016-04-02 DIAGNOSIS — M65311 Trigger thumb, right thumb: Secondary | ICD-10-CM

## 2016-04-02 DIAGNOSIS — M654 Radial styloid tenosynovitis [de Quervain]: Secondary | ICD-10-CM

## 2016-04-02 NOTE — Progress Notes (Signed)
Patient ID: Erica Graves, female   DOB: 10/13/1965, 51 y.o.   MRN: [id number]  Chief Complaint  Patient presents with  . Hand Problem    RIGHT THUMB PAIN AND LOCKING    HPI Erica HurdleJanice M Broad is a 51 y.o. female.  Presents for evaluation of pain in her right thumb  She has pain in 2 areas 1 over the A1 pulley with catching and locking of the right common pain along the metacarpal phalangeal joint and radiating up into the wrist and distal forearm  She has painful ulnar deviation and tenderness over the extensor compartment #1  Quality dull aching, severity mild/moderate, duration several weeks.  Review of Systems Review of Systems  Constitutional: Negative for fever.  Neurological: Negative for weakness and numbness.     Past Medical History:  Diagnosis Date  . Carpal tunnel syndrome, bilateral   . Chronic low back pain   . Constipation   . Counseling for estrogen replacement therapy 07/07/2014  . Depression with anxiety   . Dyspareunia 07/07/2014  . History of kidney stones   . Hot flashes 07/07/2014  . Menopause 07/07/2014  . Moody 07/07/2014  . Obesity   . Umbilical hernia     Past Surgical History:  Procedure Laterality Date  . ABDOMINAL HYSTERECTOMY    . BACK SURGERY     removal of cyst subdermal  . CARPAL TUNNEL RELEASE Left 09/30/2014   Procedure: CARPAL TUNNEL RELEASE;  Surgeon: Vickki HearingStanley E Brena Windsor, MD;  Location: [facility];  Service: Orthopedics;  Laterality: Left;  . CARPAL TUNNEL RELEASE Right 09/28/2015   Procedure: CARPAL TUNNEL RELEASE;  Surgeon: Vickki HearingStanley E Tawonda Legaspi, MD;  Location: [facility];  Service: Orthopedics;  Laterality: Right;  . COLONOSCOPY N/A 06/29/2013   Procedure: COLONOSCOPY;  Surgeon: West BaliSandi L Fields, MD;  Location: [facility];  Service: Endoscopy;  Laterality: N/A;  1:30  . KNEE ARTHROSCOPY Right   . KNEE ARTHROSCOPY  05/21/2011   Procedure: ARTHROSCOPY KNEE;  Surgeon: Vickki HearingStanley E Keontay Vora, MD;  Location: [facility];  Service: Orthopedics;  Laterality: Left;   lateral menisectomy  . KNEE CLOSED REDUCTION Left 05/26/2012   Procedure: CLOSED MANIPULATION KNEE;  Surgeon: Vickki HearingStanley E Remus Hagedorn, MD;  Location: [facility];  Service: Orthopedics;  Laterality: Left;  . left knee arthroscopy  08/14/2005 Dr. Romeo AppleHarrison torn meniscus  . LUMBAR SPINE SURGERY    . PARTIAL HYSTERECTOMY    . TOTAL KNEE ARTHROPLASTY  03/31/2012   Procedure: TOTAL KNEE ARTHROPLASTY;  Surgeon: Vickki HearingStanley E Isley Zinni, MD;  Location: [facility];  Service: Orthopedics;  Laterality: Left;  Left Total Knee Arthroplasty    Social History Social History  Substance Use Topics  . Smoking status: Never Smoker  . Smokeless tobacco: Never Used  . Alcohol use Yes     Comment: occ    Allergies  Allergen Reactions  . Norco [Hydrocodone-Acetaminophen] Nausea And Vomiting    No outpatient prescriptions have been marked as taking for the 04/02/16 encounter (Appointment) with Vickki HearingStanley E Ammarie Matsuura, MD.      Physical Exam Physical Exam There were no vitals taken for this visit.  Gen. appearance. The patient is well-developed and well-nourished, grooming and hygiene are normal. There are no gross congenital abnormalities  The patient is alert and oriented to person place and time  Mood and affect are normal  Ambulationnormal   Examination reveals the following: On inspection we find tenderness on the A1 pulley of the right thumb and tenderness over the first extensor compartment of the  right thumb  With the range of motion of   normal full range of motion with painful ulnar deviation positive Finkelstein's  Stability tests were normal    Strength tests revealed grade 5 motor strength  Skin we find no rash ulceration or erythema  Sensation remains intact  Impression vascular system shows no peripheral edema  Compared to the opposite thumb (LEFT) and wrist there is no tenderness swelling or range of motion deficits in the wrist    Data Reviewed   Assessment    Encounter Diagnoses  Name  Primary?  . Trigger thumb of right hand Yes  . De Quervain's syndrome (tenosynovitis)    Trigger finger injection  Diagnosis right trigger thumb Procedure injection A1 pulley Medications lidocaine 1% 1 mL and Depo-Medrol 40 mg 1 mL Skin prep alcohol and ethyl chloride Verbal consent was obtained Timeout confirmed the injection site  After cleaning the skin with alcohol and anesthetizing the skin with ethyl chloride the A1 pulley was palpated and the injection was performed without complication    Plan    Wear splint x 4 weeks        Fuller Canada 04/02/2016, 2:29 PM

## 2016-04-02 NOTE — Patient Instructions (Addendum)
No typing next 6 weeks    You have received an injection of steroids into the joint. 15% of patients will have increased pain within the 24 hours postinjection.   This is transient and will go away.   We recommend that you use ice packs on the injection site for 20 minutes every 2 hours and extra strength Tylenol 2 tablets every 8 as needed until the pain resolves.  If you continue to have pain after taking the Tylenol and using the ice please call the office for further instructions. De Quervain Tenosynovitis Introduction Tendons attach muscles to bones. They also help with joint movements. When tendons become irritated or swollen, it is called tendinitis. The extensor pollicis brevis (EPB) tendon connects the EPB muscle to a bone that is near the base of the thumb. The EPB muscle helps to straighten and extend the thumb. De Quervain tenosynovitis is a condition in which the EPB tendon lining (sheath) becomes irritated, thickened, and swollen. This condition is sometimes called stenosing tenosynovitis. This condition causes pain on the thumb side of the back of the wrist. What are the causes? Causes of this condition include:  Activities that repeatedly cause your thumb and wrist to extend.  A sudden increase in activity or change in activity that affects your wrist. What increases the risk? This condition is more likely to develop in:  Females.  People who have diabetes.  Women who have recently given birth.  People who are over 59 years of age.  People who do activities that involve repeated hand and wrist motions, such as tennis, racquetball, volleyball, gardening, and taking care of children.  People who do heavy labor.  People who have poor wrist strength and flexibility.  People who do not warm up properly before activities. What are the signs or symptoms? Symptoms of this condition include:  Pain or tenderness over the thumb side of the back of the wrist when your  thumb and wrist are not moving.  Pain that gets worse when you straighten your thumb or extend your thumb or wrist.  Pain when the injured area is touched.  Locking or catching of the thumb joint while you bend and straighten your thumb.  Decreased thumb motion due to pain.  Swelling over the affected area. How is this diagnosed? This condition is diagnosed with a medical history and physical exam. Your health care provider will ask for details about your injury and ask about your symptoms. How is this treated? Treatment may include the use of icing and medicines to reduce pain and swelling. You may also be advised to wear a splint or brace to limit your thumb and wrist motion. In less severe cases, treatment may also include working with a physical therapist to strengthen your wrist and calm the irritation around your EPB tendon sheath. In severe cases, surgery may be needed. Follow these instructions at home: If you have a splint or brace:  Wear it as told by your health care provider. Remove it only as told by your health care provider.  Loosen the splint or brace if your fingers become numb and tingle, or if they turn cold and blue.  Keep the splint or brace clean and dry. Managing pain, stiffness, and swelling  If directed, apply ice to the injured area.  Put ice in a plastic bag.  Place a towel between your skin and the bag.  Leave the ice on for 20 minutes, 2-3 times per day.  Move your fingers  often to avoid stiffness and to lessen swelling.  Raise (elevate) the injured area above the level of your heart while you are sitting or lying down. General instructions  Return to your normal activities as told by your health care provider. Ask your health care provider what activities are safe for you.  Take over-the-counter and prescription medicines only as told by your health care provider.  Keep all follow-up visits as told by your health care provider. This is  important.  Do not drive or operate heavy machinery while taking prescription pain medicine. Contact a health care provider if:  Your pain, tenderness, or swelling gets worse, even if you have had treatment.  You have numbness or tingling in your wrist, hand, or fingers on the injured side. This information is not intended to replace advice given to you by your health care provider. Make sure you discuss any questions you have with your health care provider. Document Released: 02/12/2005 Document Revised: 07/21/2015 Document Reviewed: 04/20/2014  2017 Elsevier Trigger Finger Trigger finger (digital tendinitis and stenosing tenosynovitis) is a common disorder that causes an often painful catching of the fingers or thumb. It occurs as a clicking, snapping, or locking of a finger in the palm of the hand. This is caused by a problem with the tendons that flex or bend the fingers sliding smoothly through their sheaths. The condition may occur in any finger or a couple fingers at the same time.  The finger may lock with the finger curled or suddenly straighten out with a snap. This is more common in patients with rheumatoid arthritis and diabetes. Left untreated, the condition may get worse to the point where the finger becomes locked in flexion, like making a fist, or less commonly locked with the finger straightened out. CAUSES   Inflammation and scarring that lead to swelling around the tendon sheath.  Repeated or forceful movements.  Rheumatoid arthritis, an autoimmune disease that affects joints.  Gout.  Diabetes mellitus. SIGNS AND SYMPTOMS  Soreness and swelling of your finger.  A painful clicking or snapping as you bend and straighten your finger. DIAGNOSIS  Your health care provider will do a physical exam of your finger to diagnose trigger finger. TREATMENT   Splinting for 6-8 weeks may be helpful.  Nonsteroidal anti-inflammatory medicines (NSAIDs) can help to relieve the  pain and inflammation.  Cortisone injections, along with splinting, may speed up recovery. Several injections may be required. Cortisone may give relief after one injection.  Surgery is another treatment that may be used if conservative treatments do not work. Surgery can be minor, without incisions (a cut does not have to be made), and can be done with a needle through the skin.  Other surgical choices involve an open procedure in which the surgeon opens the hand through a small incision and cuts the pulley so the tendon can again slide smoothly. Your hand will still work fine. HOME CARE INSTRUCTIONS  Apply ice to the injured area, twice per day:  Put ice in a plastic bag.  Place a towel between your skin and the bag.  Leave the ice on for 20 minutes, 3-4 times a day.  Rest your hand often. MAKE SURE YOU:   Understand these instructions.  Will watch your condition.  Will get help right away if you are not doing well or get worse. This information is not intended to replace advice given to you by your health care provider. Make sure you discuss any questions you have  with your health care provider. Document Released: 12/03/2003 Document Revised: 10/15/2012 Document Reviewed: 07/15/2012 Elsevier Interactive Patient Education  2017 [employer].

## 2016-05-14 ENCOUNTER — Ambulatory Visit: Payer: BC Managed Care – PPO

## 2016-05-14 ENCOUNTER — Encounter: Payer: Self-pay | Admitting: Orthopedic Surgery

## 2016-05-15 ENCOUNTER — Encounter: Payer: Self-pay | Admitting: Orthopedic Surgery

## 2016-06-18 ENCOUNTER — Encounter (HOSPITAL_COMMUNITY): Payer: Self-pay | Admitting: *Deleted

## 2016-06-18 ENCOUNTER — Emergency Department (HOSPITAL_COMMUNITY)
Admission: EM | Admit: 2016-06-18 | Discharge: 2016-06-18 | Disposition: A | Discharge status: Home / Self Care | Payer: BC Managed Care – PPO | Attending: Emergency Medicine | Admitting: Emergency Medicine

## 2016-06-18 ENCOUNTER — Emergency Department (HOSPITAL_COMMUNITY): Payer: BC Managed Care – PPO

## 2016-06-18 DIAGNOSIS — I10 Essential (primary) hypertension: Secondary | ICD-10-CM | POA: Insufficient documentation

## 2016-06-18 DIAGNOSIS — R079 Chest pain, unspecified: Secondary | ICD-10-CM | POA: Diagnosis not present

## 2016-06-18 LAB — CBC
HCT: 40.7 % (ref 36.0–46.0)
HEMOGLOBIN: 13.4 g/dL (ref 12.0–15.0)
MCH: 29.7 pg (ref 26.0–34.0)
MCHC: 32.9 g/dL (ref 30.0–36.0)
MCV: 90.2 fL (ref 78.0–100.0)
PLATELETS: 218 10*3/uL (ref 150–400)
RBC: 4.51 MIL/uL (ref 3.87–5.11)
RDW: 12.9 % (ref 11.5–15.5)
WBC: 4.5 10*3/uL (ref 4.0–10.5)

## 2016-06-18 LAB — COMPREHENSIVE METABOLIC PANEL
ALBUMIN: 4.1 g/dL (ref 3.5–5.0)
ALK PHOS: 66 U/L (ref 38–126)
ALT: 16 U/L (ref 14–54)
ANION GAP: 5 (ref 5–15)
AST: 20 U/L (ref 15–41)
BILIRUBIN TOTAL: 0.3 mg/dL (ref 0.3–1.2)
BUN: 16 mg/dL (ref 6–20)
CALCIUM: 9.2 mg/dL (ref 8.9–10.3)
CO2: 28 mmol/L (ref 22–32)
Chloride: 106 mmol/L (ref 101–111)
Creatinine, Ser: 0.68 mg/dL (ref 0.44–1.00)
GLUCOSE: 106 mg/dL — AB (ref 65–99)
POTASSIUM: 4.1 mmol/L (ref 3.5–5.1)
Sodium: 139 mmol/L (ref 135–145)
TOTAL PROTEIN: 7.5 g/dL (ref 6.5–8.1)

## 2016-06-18 LAB — TROPONIN I

## 2016-06-18 LAB — CBG MONITORING, ED: Glucose-Capillary: 114 mg/dL — ABNORMAL HIGH (ref 65–99)

## 2016-06-18 MED ORDER — GI COCKTAIL ~~LOC~~
30.0000 mL | Freq: Once | ORAL | Status: AC
  Administered 2016-06-18: 30 mL via ORAL
  Filled 2016-06-18: qty 30

## 2016-06-18 MED ORDER — FAMOTIDINE 20 MG PO TABS
20.0000 mg | ORAL_TABLET | Freq: Once | ORAL | Status: AC
  Administered 2016-06-18: 20 mg via ORAL
  Filled 2016-06-18: qty 1

## 2016-06-18 MED ORDER — FAMOTIDINE 20 MG PO TABS: 20.0000 mg | ORAL_TABLET | Freq: Two times a day (BID) | ORAL | 0 refills | Status: DC

## 2016-06-18 MED ORDER — ASPIRIN 81 MG PO CHEW
324.0000 mg | CHEWABLE_TABLET | Freq: Once | ORAL | Status: AC
  Administered 2016-06-18: 324 mg via ORAL
  Filled 2016-06-18: qty 4

## 2016-06-18 NOTE — ED Notes (Signed)
Patient had EKG done and seen by Dr Lynelle Doctor. Patient on 12 lead at this time.

## 2016-06-18 NOTE — ED Provider Notes (Signed)
[facility] Provider Note   CSN: [id number] Arrival date & time: 06/18/16  0631     History   Chief Complaint Chief Complaint  Patient presents with  . Chest Pain    HPI Erica Graves is a 51 y.o. female.  HPI  51 year old female with past medical history of reflux presents to the emergency department today with chest pain. She states that she has substernal and left-sided burning and sharp chest pain as worse in the morning and seems to get better throughout the day. She was taken Protonix that seemed to help with that but does not seem to be helping anymore. She says this is associated with nausea and this morning she did have limited dizziness. She says it gets worse with movement arm but also gets worse with movement and deep breaths. She hasn't had any fever, cough, swollen legs or positional discomfort. 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 No other known exacerbating or relieving factors. No relationship to eating. No recent car rides or surgeries.    Past Medical History:  Diagnosis Date  . Carpal tunnel syndrome, bilateral   . Chronic low back pain   . Constipation   . Counseling for estrogen replacement therapy 07/07/2014  . Depression with anxiety   . Dyspareunia 07/07/2014  . History of kidney stones   . Hot flashes 07/07/2014  . Menopause 07/07/2014  . Moody 07/07/2014  . Obesity   . Umbilical hernia     Patient Active Problem List   Diagnosis Date Noted  . Essential hypertension, benign 11/18/2015  . Gastroesophageal reflux disease without esophagitis 05/06/2015  . Irritable bowel syndrome with constipation 05/06/2015  . Hot flashes 07/07/2014  . Dyspareunia 07/07/2014  . Moody 07/07/2014  . Menopause 07/07/2014  . Counseling for estrogen replacement therapy 07/07/2014  . Morbid obesity (HCC) 03/05/2014  . Carpal tunnel syndrome of right wrist 12/22/2013  . Vitamin D deficiency 10/14/2013  . Migraine  headache without aura 11/24/2012  . Muscle contraction headache 11/24/2012  . CTS (carpal tunnel syndrome) 08/14/2012  . Stiffness of joint, not elsewhere classified, lower leg 04/22/2012  . Weakness of left leg 04/22/2012  . Difficulty walking 04/22/2012  . S/P total knee replacement 04/03/2012  . Arthritis of knee, degenerative 03/05/2012  . Osteoarthritis of left knee 12/12/2011  . S/P arthroscopy of left knee 06/13/2011  . Lateral meniscus tear 06/13/2011  . Stiffness of knee joint 06/05/2011  . Medial meniscus, posterior horn derangement 05/16/2011  . DEGENERATIVE JOINT DISEASE, KNEES, BILATERAL 12/19/2009  . 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 FOOT PAIN, LEFT 07/22/2007  . DEPRESSION/ANXIETY 12/27/2006  . SHOULDER PAIN, LEFT 12/27/2006  . OVERWEIGHT 11/12/2006  . CONSTIPATION 11/12/2006  . Lumbago 11/12/2006    Past Surgical History:  Procedure Laterality Date  . ABDOMINAL HYSTERECTOMY    . BACK SURGERY     removal of cyst subdermal  . CARPAL TUNNEL RELEASE Left 09/30/2014   Procedure: CARPAL TUNNEL RELEASE;  Surgeon: Vickki Hearing, MD;  Location: [facility];  Service: Orthopedics;  Laterality: Left;  . CARPAL TUNNEL RELEASE Right 09/28/2015   Procedure: CARPAL TUNNEL RELEASE;  Surgeon: Vickki Hearing, MD;  Location: [facility];  Service: Orthopedics;  Laterality: Right;  . COLONOSCOPY N/A 06/29/2013   Procedure: COLONOSCOPY;  Surgeon: West Bali, MD;  Location: [facility];  Service: Endoscopy;  Laterality: N/A;  1:30  . KNEE ARTHROSCOPY Right   . KNEE ARTHROSCOPY  05/21/2011  Procedure: ARTHROSCOPY KNEE;  Surgeon: Vickki Hearing, MD;  Location: [facility];  Service: Orthopedics;  Laterality: Left;  lateral menisectomy  . KNEE CLOSED REDUCTION Left 05/26/2012   Procedure: CLOSED MANIPULATION KNEE;  Surgeon: Vickki Hearing, MD;  Location: [facility];  Service: Orthopedics;  Laterality: Left;  . left knee arthroscopy  08/14/2005 Dr. Romeo Apple torn meniscus  . LUMBAR  SPINE SURGERY    . PARTIAL HYSTERECTOMY    . TOTAL KNEE ARTHROPLASTY  03/31/2012   Procedure: TOTAL KNEE ARTHROPLASTY;  Surgeon: Vickki Hearing, MD;  Location: [facility];  Service: Orthopedics;  Laterality: Left;  Left Total Knee Arthroplasty    OB History    Gravida Para Term Preterm AB Living   3 3           SAB TAB Ectopic Multiple Live Births                   Home Medications    Prior to Admission medications   Medication Sig Start Date End Date Taking? Authorizing Provider  cholecalciferol (VITAMIN D) 1000 units tablet Take 1,000 Units by mouth daily.   Yes Historical Provider, MD  famotidine (PEPCID) 20 MG tablet Take 1 tablet (20 mg total) by mouth 2 (two) times daily. 06/18/16   Marily Memos, MD    Family History Family History  Problem Relation Age of Onset  . Heart disease Mother   . Hypertension Mother   . Colon cancer Neg Hx     Social History Social History  Substance Use Topics  . Smoking status: Never Smoker  . Smokeless tobacco: Never Used  . Alcohol use Yes     Comment: occ     Allergies   Norco [hydrocodone-acetaminophen]   Review of Systems Review of Systems  All other systems reviewed and are negative.    Physical Exam Updated Vital Signs BP (!) 143/90   Pulse 62   Temp 97.7 F (36.5 C) (Oral)   Resp 19   Ht  (1.651 m)   Wt 190 lb (86.2 kg)   SpO2 100%   BMI 31.62 kg/m   Physical Exam  Constitutional: She is oriented to person, place, and time. She appears well-developed and well-nourished.  HENT:  Head: Normocephalic and atraumatic.  Eyes: Conjunctivae and EOM are normal. Pupils are equal, round, and reactive to light.  Neck: Normal range of motion.  Cardiovascular: Normal rate and regular rhythm.  Exam reveals no gallop and no friction rub.   No murmur heard. Pulmonary/Chest: Effort normal and breath sounds normal. No stridor. No respiratory distress. She has no wheezes. She has no rales. She exhibits no tenderness.    Abdominal: She exhibits no distension.  Musculoskeletal: Normal range of motion. She exhibits no edema or deformity.  Neurological: She is alert and oriented to person, place, and time.  Skin: No rash noted.  Nursing note and vitals reviewed.    ED Treatments / Results  Labs (all labs ordered are listed, but only abnormal results are displayed) Labs Reviewed  COMPREHENSIVE METABOLIC PANEL - Abnormal; Notable for the following:       Result Value   Glucose, Bld 106 (*)    All other components within normal limits  CBG MONITORING, ED - Abnormal; Notable for the following:    Glucose-Capillary 114 (*)    All other components within normal limits  TROPONIN I  CBC  TROPONIN I    EKG  EKG Interpretation  Date/Time:  Monday June 18 2016 06:40:11 EDT Ventricular Rate:  62 PR Interval:    QRS Duration: 90 QT Interval:  420 QTC Calculation: 430 R Axis:   14 Text Interpretation:  Sinus rhythm Low voltage, precordial leads No significant change since last tracing 08 May 2015 Confirmed by KNAPP  MD-I, IVA ([id number]) on 06/18/2016 6:43:17 AM       Radiology Dg Chest 2 View  Result Date: 06/18/2016 CLINICAL DATA:  Chest pain EXAM: CHEST  2 VIEW COMPARISON:  05/08/2015 chest radiograph. FINDINGS: Stable cardiomediastinal silhouette with normal heart size. No pneumothorax. No pleural effusion. Lungs appear clear, with no acute consolidative airspace disease and no pulmonary edema. IMPRESSION: No active cardiopulmonary disease. Electronically Signed   By: Delbert Phenix M.D.   On: 06/18/2016 08:09    Procedures Procedures (including critical care time)  Medications Ordered in ED Medications  aspirin chewable tablet 324 mg (324 mg Oral Given 06/18/16 0742)  gi cocktail (Maalox,Lidocaine,Donnatal) (30 mLs Oral Given 06/18/16 0742)  famotidine (PEPCID) tablet 20 mg (20 mg Oral Given 06/18/16 6045)     Initial Impression / Assessment and Plan / ED Course  I have reviewed the triage vital  signs and the nursing notes.  Pertinent labs & imaging results that were available during my care of the patient were reviewed by me and considered in my medical decision making (see chart for details).     The pain seems more GI related so we'll give her a GI cocktail and Pepcid. Does have a muscular skeletal component so we'll give her Toradol as well. Less likely to be ACS with a low heart score. We'll do a delta troponins and serial EKGs to rule it out. Doubt PE, dissection, pneumonia based on history and physical.  Suspect likely reflux. Will start pepcid and continue nexium at home. May still need cardiology, but will defer to PCP to follow up and consider stress v cardiology referral.   Final Clinical Impressions(s) / ED Diagnoses   Final diagnoses:  Chest pain, unspecified type    New Prescriptions Discharge Medication List as of 06/18/2016 11:57 AM    START taking these medications   Details  famotidine (PEPCID) 20 MG tablet Take 1 tablet (20 mg total) by mouth 2 (two) times daily., Starting Mon 06/18/2016, Print         Marily Memos, MD 06/18/16 [phone]

## 2016-06-18 NOTE — ED Notes (Signed)
Pt ambulated to bathroom 

## 2016-06-18 NOTE — ED Triage Notes (Signed)
Pt c/o mid center to left side chest pain that radiates to left arm with nausea that started a week ago, at first the pain was intermittent but now pain remains constant,

## 2016-06-21 ENCOUNTER — Encounter: Payer: Self-pay | Admitting: Nurse Practitioner

## 2016-06-21 ENCOUNTER — Ambulatory Visit (INDEPENDENT_AMBULATORY_CARE_PROVIDER_SITE_OTHER): Payer: BC Managed Care – PPO | Admitting: Nurse Practitioner

## 2016-06-21 VITALS — BP 136/92 | Ht 65.0 in | Wt 203.5 lb

## 2016-06-21 DIAGNOSIS — K219 Gastro-esophageal reflux disease without esophagitis: Secondary | ICD-10-CM

## 2016-06-21 DIAGNOSIS — G4700 Insomnia, unspecified: Secondary | ICD-10-CM | POA: Diagnosis not present

## 2016-06-21 DIAGNOSIS — I10 Essential (primary) hypertension: Secondary | ICD-10-CM

## 2016-06-21 MED ORDER — ALPRAZOLAM 0.5 MG PO TABS
0.5000 mg | ORAL_TABLET | Freq: Every evening | ORAL | 2 refills | Status: DC | PRN
Start: 2016-06-21 — End: 2017-01-06

## 2016-06-21 MED ORDER — METOPROLOL SUCCINATE ER 25 MG PO TB24: 25.0000 mg | ORAL_TABLET | Freq: Every day | ORAL | 0 refills | Status: DC

## 2016-06-21 NOTE — Patient Instructions (Signed)
DASH Eating Plan DASH stands for "Dietary Approaches to Stop Hypertension." The DASH eating plan is a healthy eating plan that has been shown to reduce high blood pressure (hypertension). It may also reduce your risk for type 2 diabetes, heart disease, and stroke. The DASH eating plan may also help with weight loss. What are tips for following this plan? General guidelines   Avoid eating more than 2,300 mg (milligrams) of salt (sodium) a day. If you have hypertension, you may need to reduce your sodium intake to 1,500 mg a day.  Limit alcohol intake to no more than 1 drink a day for nonpregnant women and 2 drinks a day for men. One drink equals 12 oz of beer, 5 oz of wine, or 1 oz of hard liquor.  Work with your health care provider to maintain a healthy body weight or to lose weight. Ask what an ideal weight is for you.  Get at least 30 minutes of exercise that causes your heart to beat faster (aerobic exercise) most days of the week. Activities may include walking, swimming, or biking.  Work with your health care provider or diet and nutrition specialist (dietitian) to adjust your eating plan to your individual calorie needs. Reading food labels   Check food labels for the amount of sodium per serving. Choose foods with less than 5 percent of the Daily Value of sodium. Generally, foods with less than 300 mg of sodium per serving fit into this eating plan.  To find whole grains, look for the word "whole" as the first word in the ingredient list. Shopping   Buy products labeled as "low-sodium" or "no salt added."  Buy fresh foods. Avoid canned foods and premade or frozen meals. Cooking   Avoid adding salt when cooking. Use salt-free seasonings or herbs instead of table salt or sea salt. Check with your health care provider or pharmacist before using salt substitutes.  Do not fry foods. Cook foods using healthy methods such as baking, boiling, grilling, and broiling instead.  Cook with  heart-healthy oils, such as olive, canola, soybean, or sunflower oil. Meal planning    Eat a balanced diet that includes:  5 or more servings of fruits and vegetables each day. At each meal, try to fill half of your plate with fruits and vegetables.  Up to 6-8 servings of whole grains each day.  Less than 6 oz of lean meat, poultry, or fish each day. A 3-oz serving of meat is about the same size as a deck of cards. One egg equals 1 oz.  2 servings of low-fat dairy each day.  A serving of nuts, seeds, or beans 5 times each week.  Heart-healthy fats. Healthy fats called Omega-3 fatty acids are found in foods such as flaxseeds and coldwater fish, like sardines, salmon, and mackerel.  Limit how much you eat of the following:  Canned or prepackaged foods.  Food that is high in trans fat, such as fried foods.  Food that is high in saturated fat, such as fatty meat.  Sweets, desserts, sugary drinks, and other foods with added sugar.  Full-fat dairy products.  Do not salt foods before eating.  Try to eat at least 2 vegetarian meals each week.  Eat more home-cooked food and less restaurant, buffet, and fast food.  When eating at a restaurant, ask that your food be prepared with less salt or no salt, if possible. What foods are recommended? The items listed may not be a complete list. Talk   with your dietitian about what dietary choices are best for you. Grains  Whole-grain or whole-wheat bread. Whole-grain or whole-wheat pasta. Brown rice. Oatmeal. Quinoa. Bulgur. Whole-grain and low-sodium cereals. Pita bread. Low-fat, low-sodium crackers. Whole-wheat flour tortillas. Vegetables  Fresh or frozen vegetables (raw, steamed, roasted, or grilled). Low-sodium or reduced-sodium tomato and vegetable juice. Low-sodium or reduced-sodium tomato sauce and tomato paste. Low-sodium or reduced-sodium canned vegetables. Fruits  All fresh, dried, or frozen fruit. Canned fruit in natural juice  (without added sugar). Meat and other protein foods  Skinless chicken or turkey. Ground chicken or turkey. Pork with fat trimmed off. Fish and seafood. Egg whites. Dried beans, peas, or lentils. Unsalted nuts, nut butters, and seeds. Unsalted canned beans. Lean cuts of beef with fat trimmed off. Low-sodium, lean deli meat. Dairy  Low-fat (1%) or fat-free (skim) milk. Fat-free, low-fat, or reduced-fat cheeses. Nonfat, low-sodium ricotta or cottage cheese. Low-fat or nonfat yogurt. Low-fat, low-sodium cheese. Fats and oils  Soft margarine without trans fats. Vegetable oil. Low-fat, reduced-fat, or light mayonnaise and salad dressings (reduced-sodium). Canola, safflower, olive, soybean, and sunflower oils. Avocado. Seasoning and other foods  Herbs. Spices. Seasoning mixes without salt. Unsalted popcorn and pretzels. Fat-free sweets. What foods are not recommended? The items listed may not be a complete list. Talk with your dietitian about what dietary choices are best for you. Grains  Baked goods made with fat, such as croissants, muffins, or some breads. Dry pasta or rice meal packs. Vegetables  Creamed or fried vegetables. Vegetables in a cheese sauce. Regular canned vegetables (not low-sodium or reduced-sodium). Regular canned tomato sauce and paste (not low-sodium or reduced-sodium). Regular tomato and vegetable juice (not low-sodium or reduced-sodium). Pickles. Olives. Fruits  Canned fruit in a light or heavy syrup. Fried fruit. Fruit in cream or butter sauce. Meat and other protein foods  Fatty cuts of meat. Ribs. Fried meat. Bacon. Sausage. Bologna and other processed lunch meats. Salami. Fatback. Hotdogs. Bratwurst. Salted nuts and seeds. Canned beans with added salt. Canned or smoked fish. Whole eggs or egg yolks. Chicken or turkey with skin. Dairy  Whole or 2% milk, cream, and half-and-half. Whole or full-fat cream cheese. Whole-fat or sweetened yogurt. Full-fat cheese. Nondairy creamers.  Whipped toppings. Processed cheese and cheese spreads. Fats and oils  Butter. Stick margarine. Lard. Shortening. Ghee. Bacon fat. Tropical oils, such as coconut, palm kernel, or palm oil. Seasoning and other foods  Salted popcorn and pretzels. Onion salt, garlic salt, seasoned salt, table salt, and sea salt. Worcestershire sauce. Tartar sauce. Barbecue sauce. Teriyaki sauce. Soy sauce, including reduced-sodium. Steak sauce. Canned and packaged gravies. Fish sauce. Oyster sauce. Cocktail sauce. Horseradish that you find on the shelf. Ketchup. Mustard. Meat flavorings and tenderizers. Bouillon cubes. Hot sauce and Tabasco sauce. Premade or packaged marinades. Premade or packaged taco seasonings. Relishes. Regular salad dressings. Where to find more information:  National Heart, Lung, and Blood Institute: www.nhlbi.nih.gov  American Heart Association: www.heart.org Summary  The DASH eating plan is a healthy eating plan that has been shown to reduce high blood pressure (hypertension). It may also reduce your risk for type 2 diabetes, heart disease, and stroke.  With the DASH eating plan, you should limit salt (sodium) intake to 2,300 mg a day. If you have hypertension, you may need to reduce your sodium intake to 1,500 mg a day.  When on the DASH eating plan, aim to eat more fresh fruits and vegetables, whole grains, lean proteins, low-fat dairy, and heart-healthy fats.  Work   with your health care provider or diet and nutrition specialist (dietitian) to adjust your eating plan to your individual calorie needs. This information is not intended to replace advice given to you by your health care provider. Make sure you discuss any questions you have with your health care provider. Document Released: 02/01/2011 Document Revised: 02/06/2016 Document Reviewed: 02/06/2016 Elsevier Interactive Patient Education  2017 [employer] Hypertension Hypertension, commonly called high blood pressure, is when  the force of blood pumping through the arteries is too strong. The arteries are the blood vessels that carry blood from the heart throughout the body. Hypertension forces the heart to work harder to pump blood and may cause arteries to become narrow or stiff. Having untreated or uncontrolled hypertension can cause heart attacks, strokes, kidney disease, and other problems. A blood pressure reading consists of a higher number over a lower number. Ideally, your blood pressure should be below 120/80. The first ("top") number is called the systolic pressure. It is a measure of the pressure in your arteries as your heart beats. The second ("bottom") number is called the diastolic pressure. It is a measure of the pressure in your arteries as the heart relaxes. What are the causes? The cause of this condition is not known. What increases the risk? Some risk factors for high blood pressure are under your control. Others are not. Factors you can change   Smoking.  Having type 2 diabetes mellitus, high cholesterol, or both.  Not getting enough exercise or physical activity.  Being overweight.  Having too much fat, sugar, calories, or salt (sodium) in your diet.  Drinking too much alcohol. Factors that are difficult or impossible to change   Having chronic kidney disease.  Having a family history of high blood pressure.  Age. Risk increases with age.  Race. You may be at higher risk if you are African-American.  Gender. Men are at higher risk than women before age 45. After age 65, women are at higher risk than men.  Having obstructive sleep apnea.  Stress. What are the signs or symptoms? Extremely high blood pressure (hypertensive crisis) may cause:  Headache.  Anxiety.  Shortness of breath.  Nosebleed.  Nausea and vomiting.  Severe chest pain.  Jerky movements you cannot control (seizures). How is this diagnosed? This condition is diagnosed by measuring your blood pressure  while you are seated, with your arm resting on a surface. The cuff of the blood pressure monitor will be placed directly against the skin of your upper arm at the level of your heart. It should be measured at least twice using the same arm. Certain conditions can cause a difference in blood pressure between your right and left arms. Certain factors can cause blood pressure readings to be lower or higher than normal (elevated) for a short period of time:  When your blood pressure is higher when you are in a health care provider's office than when you are at home, this is called white coat hypertension. Most people with this condition do not need medicines.  When your blood pressure is higher at home than when you are in a health care provider's office, this is called masked hypertension. Most people with this condition may need medicines to control blood pressure. If you have a high blood pressure reading during one visit or you have normal blood pressure with other risk factors:  You may be asked to return on a different day to have your blood pressure checked again.  You may   be asked to monitor your blood pressure at home for 1 week or longer. If you are diagnosed with hypertension, you may have other blood or imaging tests to help your health care provider understand your overall risk for other conditions. How is this treated? This condition is treated by making healthy lifestyle changes, such as eating healthy foods, exercising more, and reducing your alcohol intake. Your health care provider may prescribe medicine if lifestyle changes are not enough to get your blood pressure under control, and if:  Your systolic blood pressure is above 130.  Your diastolic blood pressure is above 80. Your personal target blood pressure may vary depending on your medical conditions, your age, and other factors. Follow these instructions at home: Eating and drinking   Eat a diet that is high in fiber and  potassium, and low in sodium, added sugar, and fat. An example eating plan is called the DASH (Dietary Approaches to Stop Hypertension) diet. To eat this way:  Eat plenty of fresh fruits and vegetables. Try to fill half of your plate at each meal with fruits and vegetables.  Eat whole grains, such as whole wheat pasta, brown rice, or whole grain bread. Fill about one quarter of your plate with whole grains.  Eat or drink low-fat dairy products, such as skim milk or low-fat yogurt.  Avoid fatty cuts of meat, processed or cured meats, and poultry with skin. Fill about one quarter of your plate with lean proteins, such as fish, chicken without skin, beans, eggs, and tofu.  Avoid premade and processed foods. These tend to be higher in sodium, added sugar, and fat.  Reduce your daily sodium intake. Most people with hypertension should eat less than 1,500 mg of sodium a day.  Limit alcohol intake to no more than 1 drink a day for nonpregnant women and 2 drinks a day for men. One drink equals 12 oz of beer, 5 oz of wine, or 1 oz of hard liquor. Lifestyle   Work with your health care provider to maintain a healthy body weight or to lose weight. Ask what an ideal weight is for you.  Get at least 30 minutes of exercise that causes your heart to beat faster (aerobic exercise) most days of the week. Activities may include walking, swimming, or biking.  Include exercise to strengthen your muscles (resistance exercise), such as pilates or lifting weights, as part of your weekly exercise routine. Try to do these types of exercises for 30 minutes at least 3 days a week.  Do not use any products that contain nicotine or tobacco, such as cigarettes and e-cigarettes. If you need help quitting, ask your health care provider.  Monitor your blood pressure at home as told by your health care provider.  Keep all follow-up visits as told by your health care provider. This is important. Medicines   Take  over-the-counter and prescription medicines only as told by your health care provider. Follow directions carefully. Blood pressure medicines must be taken as prescribed.  Do not skip doses of blood pressure medicine. Doing this puts you at risk for problems and can make the medicine less effective.  Ask your health care provider about side effects or reactions to medicines that you should watch for. Contact a health care provider if:  You think you are having a reaction to a medicine you are taking.  You have headaches that keep coming back (recurring).  You feel dizzy.  You have swelling in your ankles.  You   have trouble with your vision. Get help right away if:  You develop a severe headache or confusion.  You have unusual weakness or numbness.  You feel faint.  You have severe pain in your chest or abdomen.  You vomit repeatedly.  You have trouble breathing. Summary  Hypertension is when the force of blood pumping through your arteries is too strong. If this condition is not controlled, it may put you at risk for serious complications.  Your personal target blood pressure may vary depending on your medical conditions, your age, and other factors. For most people, a normal blood pressure is less than 120/80.  Hypertension is treated with lifestyle changes, medicines, or a combination of both. Lifestyle changes include weight loss, eating a healthy, low-sodium diet, exercising more, and limiting alcohol. This information is not intended to replace advice given to you by your health care provider. Make sure you discuss any questions you have with your health care provider. Document Released: 02/12/2005 Document Revised: 01/11/2016 Document Reviewed: 01/11/2016 Elsevier Interactive Patient Education  2017 [employer] Inc.  

## 2016-06-22 ENCOUNTER — Encounter: Payer: Self-pay | Admitting: Nurse Practitioner

## 2016-06-22 NOTE — Progress Notes (Signed)
Subjective:  Presents for routine follow-up of her blood pressure. Not currently on any medication. No chest pain/ischemic type pain or shortness of breath. No edema. Has been taking melatonin for sleep which does not last all night, waking up after a few hours. Did well on low-dose Xanax in the past. Admits to being under tremendous stress. Nonsmoker. No added salt to diet. Strong family history of hypertension. Taking Pepcid for reflux which has been stable.  Objective:   BP (!) 136/92   Ht  (1.651 m)   Wt 203 lb 8 oz (92.3 kg)   BMI 33.86 kg/m  NAD. Alert, oriented. Lungs clear. Heart regular rate rhythm. No murmur or gallop noted. Carotids no bruits or thrills. BP on recheck right arm sitting 164/106. Lower extremities no edema. Abdomen soft nondistended nontender.  Assessment:   Problem List Items Addressed This Visit      Cardiovascular and Mediastinum   Essential hypertension, benign - Primary   Relevant Medications   metoprolol succinate (TOPROL-XL) 25 MG 24 hr tablet     Digestive   Gastroesophageal reflux disease without esophagitis    Other Visit Diagnoses    Insomnia, unspecified type           Plan:   Meds ordered this encounter  Medications  . metoprolol succinate (TOPROL-XL) 25 MG 24 hr tablet    Sig: Take 1 tablet (25 mg total) by mouth daily.    Dispense:  90 tablet    Refill:  0    Order Specific Question:   Supervising Provider    Answer:   Merlyn Albert [[id number]]  . ALPRAZolam (XANAX) 0.5 MG tablet    Sig: Take 1 tablet (0.5 mg total) by mouth at bedtime as needed for sleep.    Dispense:  30 tablet    Refill:  2    Order Specific Question:   Supervising Provider    Answer:   Merlyn Albert [[id number]]   Restart Xanax as directed at bedtime for sleep. Lengthy discussion regarding lifestyle factors affecting her blood pressure. Encourage regular activity and weight loss and stress reduction. Restart Toprol as directed. Check BP at home, if it remains  over 140/90 call back to office some medication can be increased.  Encourage preventive health physical. Return in about 3 months (around 09/20/2016) for BP recheck.

## 2016-07-04 ENCOUNTER — Ambulatory Visit (INDEPENDENT_AMBULATORY_CARE_PROVIDER_SITE_OTHER): Payer: BC Managed Care – PPO | Admitting: Nurse Practitioner

## 2016-07-04 ENCOUNTER — Encounter: Payer: Self-pay | Admitting: Nurse Practitioner

## 2016-07-04 VITALS — BP 130/82 | Ht 65.0 in | Wt 204.2 lb

## 2016-07-04 DIAGNOSIS — I10 Essential (primary) hypertension: Secondary | ICD-10-CM

## 2016-07-04 DIAGNOSIS — R5383 Other fatigue: Secondary | ICD-10-CM | POA: Diagnosis not present

## 2016-07-04 DIAGNOSIS — Z0000 Encounter for general adult medical examination without abnormal findings: Secondary | ICD-10-CM

## 2016-07-04 DIAGNOSIS — E559 Vitamin D deficiency, unspecified: Secondary | ICD-10-CM

## 2016-07-04 DIAGNOSIS — E785 Hyperlipidemia, unspecified: Secondary | ICD-10-CM

## 2016-07-04 NOTE — Progress Notes (Signed)
   Subjective:    Patient ID: Erica Graves, female    DOB: December 27, 1965, 51 y.o.   MRN: [id number]  HPI presents for her wellness exam. Same sexual partner. No pelvic pain. Doing better with diet. Active job. Regular vision and dental exams. Has a long term cyst on anterior neck that she wants removed. Only slight increase in size. Non tender.     Review of Systems  Constitutional: Negative for activity change, appetite change and fatigue.  HENT: Negative for dental problem, ear pain, sinus pressure and sore throat.   Respiratory: Negative for cough, chest tightness, shortness of breath and wheezing.   Cardiovascular: Negative for chest pain.  Gastrointestinal: Negative for abdominal distention, abdominal pain, blood in stool, constipation, diarrhea, nausea and vomiting.  Genitourinary: Negative for difficulty urinating, dysuria, enuresis, frequency, genital sores, pelvic pain, urgency and vaginal discharge.       Objective:   Physical Exam  Constitutional: She is oriented to person, place, and time. She appears well-developed. No distress.  HENT:  Right Ear: External ear normal.  Left Ear: External ear normal.  Mouth/Throat: Oropharynx is clear and moist.  Neck: Normal range of motion. Neck supple. No tracheal deviation present. No thyromegaly present.  Cardiovascular: Normal rate, regular rhythm and normal heart sounds.  Exam reveals no gallop.   No murmur heard. Pulmonary/Chest: Effort normal and breath sounds normal.  Abdominal: Soft. She exhibits no distension. There is no tenderness.  Genitourinary:  Genitourinary Comments: Defers pelvic exam; denies any problems.   Musculoskeletal: She exhibits no edema.  Lymphadenopathy:    She has no cervical adenopathy.  Neurological: She is alert and oriented to person, place, and time.  Skin: Skin is warm and dry. No rash noted.  Psychiatric: She has a normal mood and affect. Her behavior is normal.  Vitals reviewed. Breast exam: no  masses; axillae no adenopathy.         Assessment & Plan:   Problem List Items Addressed This Visit      Cardiovascular and Mediastinum   Essential hypertension, benign   Relevant Orders   Basic metabolic panel   Hepatic function panel     Other   Vitamin D deficiency   Relevant Orders   VITAMIN D 25 Hydroxy (Vit-D Deficiency, Fractures)    Other Visit Diagnoses    Routine general medical examination at a health care facility    -  Primary   Fatigue, unspecified type       Relevant Orders   TSH   Hyperlipidemia, unspecified hyperlipidemia type       Relevant Orders   Lipid panel     Encouraged continued weight loss efforts and daily vitamin D supplement. Labs pending. Return in about 6 months (around 01/04/2017) for BP check up.

## 2016-08-07 ENCOUNTER — Encounter: Payer: Self-pay | Admitting: Orthopedic Surgery

## 2016-08-07 ENCOUNTER — Ambulatory Visit (INDEPENDENT_AMBULATORY_CARE_PROVIDER_SITE_OTHER): Payer: BC Managed Care – PPO

## 2016-08-07 ENCOUNTER — Ambulatory Visit (INDEPENDENT_AMBULATORY_CARE_PROVIDER_SITE_OTHER): Payer: BC Managed Care – PPO | Admitting: Orthopedic Surgery

## 2016-08-07 DIAGNOSIS — M1710 Unilateral primary osteoarthritis, unspecified knee: Secondary | ICD-10-CM | POA: Diagnosis not present

## 2016-08-07 DIAGNOSIS — Z96652 Presence of left artificial knee joint: Secondary | ICD-10-CM | POA: Diagnosis not present

## 2016-08-07 NOTE — Progress Notes (Signed)
ANNUAL FOLLOW UP FOR LEFT TKA   Chief Complaint  Patient presents with  . Follow-up    ANNUAL XRAY LT TKA, DOS 03/31/12     HPI: The patient is here for the annual  follow-up x-ray for knee replacement   Review of Systems  Constitutional: Negative for chills and fever.  Musculoskeletal:       Posterior right knee pain giving way    Examination of the LEFT KNEE   Inspection shows : incision healed nicely without erythema, no tenderness no swelling  Range of motion total range of motion is 115  Stability the knee is stable anterior to posterior as well as medial to lateral  Strength quadriceps strength is normal  Skin no erythema around the skin incision  Cardiovascular NO EDEMA   Right knee exam hyperextension of the right knee chronic posterior capsular pain in has anserine bursal pain no instability   Medical decision-making section  X-rays ordered with the following personal interpretation  Normal alignment without loosening   Diagnosis  Encounter Diagnosis  Name Primary?  . History of total left knee replacement Yes     Plan follow-up 1 year repeat x-rays

## 2016-09-20 ENCOUNTER — Ambulatory Visit: Payer: BC Managed Care – PPO | Admitting: Nurse Practitioner

## 2016-12-26 ENCOUNTER — Ambulatory Visit (INDEPENDENT_AMBULATORY_CARE_PROVIDER_SITE_OTHER): Payer: BC Managed Care – PPO | Admitting: Orthopedic Surgery

## 2016-12-26 ENCOUNTER — Encounter: Payer: Self-pay | Admitting: Orthopedic Surgery

## 2016-12-26 VITALS — BP 149/88 | HR 60 | Ht 65.0 in | Wt 206.0 lb

## 2016-12-26 DIAGNOSIS — M65311 Trigger thumb, right thumb: Secondary | ICD-10-CM | POA: Diagnosis not present

## 2016-12-26 NOTE — Progress Notes (Signed)
Progress Note   Patient ID: Erica Graves, female   DOB: Oct 02, 1965, 51 y.o.   MRN: [id number]  Chief Complaint  Patient presents with  . Hand Pain    c/o decreased ROM right thumb feels stiff     51 year old female with bilateral carpal tunnel releases presents with pain swelling over the A1 pulley with loss of motion especially flexion of the thumb with constant pain which is dull aching moderate  Previous injection was performed it worked well     Review of Systems  Constitutional: Negative for chills and fever.  Neurological: Positive for tingling.   Current Meds  Medication Sig  . ALPRAZolam (XANAX) 0.5 MG tablet Take 1 tablet (0.5 mg total) by mouth at bedtime as needed for sleep.  . cholecalciferol (VITAMIN D) 1000 units tablet Take 1,000 Units by mouth daily.  . famotidine (PEPCID) 20 MG tablet Take 1 tablet (20 mg total) by mouth 2 (two) times daily.  . metoprolol succinate (TOPROL-XL) 25 MG 24 hr tablet Take 1 tablet (25 mg total) by mouth daily.    Past Medical History:  Diagnosis Date  . Carpal tunnel syndrome, bilateral   . Chronic low back pain   . Constipation   . Counseling for estrogen replacement therapy 07/07/2014  . Depression with anxiety   . Dyspareunia 07/07/2014  . History of kidney stones   . Hot flashes 07/07/2014  . Menopause 07/07/2014  . Moody 07/07/2014  . Obesity   . Umbilical hernia      Allergies  Allergen Reactions  . Norco [Hydrocodone-Acetaminophen] Nausea And Vomiting    BP (!) 149/88   Pulse 60   Ht 5\' 5"  (1.651 m)   Wt 206 lb (93.4 kg)   BMI 34.28 kg/m    Physical Exam Gen. appearance the patient's appearance is normal with normal grooming and  hygiene The patient is oriented to person place and time Mood and affect are normal   Ortho Exam Right thumb is tender over the A1 pulley there swelling she cannot flex the DIP joint the joint is stable flexor tendon is intact skin is normal pulses are good sensation is normal  as well. She has an incision from her prior carpal tunnel release  Epitrochlear lymph nodes are normal  The left hand has normal flexion of the digit involving the left thumb   Medical decision-making Encounter Diagnosis  Name Primary?  . Trigger thumb of right hand Yes    Right Trigger thumb injection Medication  1 mL of 40 mg Depo-Medrol  2 mL of 1% lidocaine plain  Ethyl chloride for anesthesia  Verbal consent was obtained timeout was taken to confirm the injection site as right thumb  Alcohol was used to prepare the skin along with ethyl chloride and then the injection was made at the A1 pulley there were no complications   No orders of the defined types were placed in this encounter.  She would like to proceed with trigger thumb release right hand on November 29 with follow-up for December 12  [address]55  The procedure has been fully reviewed with the patient; The risks and benefits of surgery have been discussed and explained and understood. Alternative treatment has also been reviewed, questions were encouraged and answered. The postoperative plan is also been reviewed.   Fuller CanadaStanley Lesslie Mckeehan, MD 12/26/2016 3:22 PM

## 2016-12-26 NOTE — Patient Instructions (Addendum)
You have received an injection of steroids into the joint. 15% of patients will have increased pain within the 24 hours postinjection.   This is transient and will go away.   We recommend that you use ice packs on the injection site for 20 minutes every 2 hours and extra strength Tylenol 2 tablets every 8 as needed until the pain resolves.  If you continue to have pain after taking the Tylenol and using the ice please call the office for further instructions.  Trigger Finger Trigger finger (stenosing tenosynovitis) is a condition that causes a finger to get stuck in a bent position. Each finger has a tough, cord-like tissue that connects muscle to bone (tendon), and each tendon is surrounded by a tunnel of tissue (tendon sheath). To move your finger, your tendon needs to slide freely through the sheath. Trigger finger happens when the tendon or the sheath thickens, making it difficult to move your finger. Trigger finger can affect any finger or a thumb. It may affect more than one finger. Mild cases may clear up with rest and medicine. Severe cases require more treatment. What are the causes? Trigger finger is caused by a thickened finger tendon or tendon sheath. The cause of this thickening is not known. What increases the risk? The following factors may make you more likely to develop this condition:  Doing activities that require a strong grip.  Having rheumatoid arthritis, gout, or diabetes.  Being 40-60 years old.  Being a woman. What are the signs or symptoms? Symptoms of this condition include:  Pain when bending or straightening your finger.  Tenderness or swelling where your finger attaches to the palm of your hand.  A lump in the palm of your hand or on the inside of your finger.  Hearing a popping sound when you try to straighten your finger.  Feeling a popping, catching, or locking sensation when you try to straighten your finger.  Being unable to straighten your  finger. How is this diagnosed? This condition is diagnosed based on your symptoms and a physical exam. How is this treated? This condition may be treated by:  Resting your finger and avoiding activities that make symptoms worse.  Wearing a finger splint to keep your finger in a slightly bent position.  Taking NSAIDs to relieve pain and swelling.  Injecting medicine (steroids) into the tendon sheath to reduce swelling and irritation. Injections may need to be repeated.  Having surgery to open the tendon sheath. This may be done if other treatments do not work and you cannot straighten your finger. You may need physical therapy after surgery. Follow these instructions at home:  Use moist heat to help reduce pain and swelling as told by your health care provider.  Rest your finger and avoid activities that make pain worse. Return to normal activities as told by your health care provider.  If you have a splint, wear it as told by your health care provider.  Take over-the-counter and prescription medicines only as told by your health care provider.  Keep all follow-up visits as told by your health care provider. This is important. Contact a health care provider if:  Your symptoms are not improving with home care. Summary  Trigger finger (stenosing tenosynovitis) causes your finger to get stuck in a bent position, and it can make it difficult and painful to straighten your finger.  This condition develops when a finger tendon or tendon sheath thickens.  Treatment starts with resting, wearing a splint,   and taking NSAIDs.  In severe cases, surgery to open the tendon sheath may be needed. This information is not intended to replace advice given to you by your health care provider. Make sure you discuss any questions you have with your health care provider. Document Released: 12/03/2003 Document Revised: 01/24/2016 Document Reviewed: 01/24/2016 Elsevier Interactive Patient Education   2017 Elsevier Inc.  

## 2017-01-01 ENCOUNTER — Telehealth: Payer: Self-pay | Admitting: Radiology

## 2017-01-01 NOTE — Addendum Note (Signed)
Addended by: Fuller CanadaHARRISON, Frona Yost E on: 01/01/2017 12:10 PM   Modules accepted: Orders, SmartSet

## 2017-01-01 NOTE — Telephone Encounter (Signed)
-----   Message from Nobie Putnamarolyn H Young sent at 01/01/2017 12:34 PM EST ----- Regarding: surgery Pre op 11/21 @ 9:00, surgery 11/29 @ 7:30

## 2017-01-01 NOTE — Telephone Encounter (Signed)
I have called patient about preop and surgery times. She has voiced understanding.

## 2017-01-01 NOTE — Telephone Encounter (Signed)
I called [employer] today to check on  Prior authorization for surgery cpt 26055 spoke to

## 2017-01-01 NOTE — Telephone Encounter (Signed)
Spoke to [place] and have been told no authorization is required.

## 2017-01-04 ENCOUNTER — Ambulatory Visit: Payer: BC Managed Care – PPO | Admitting: Nurse Practitioner

## 2017-01-06 ENCOUNTER — Other Ambulatory Visit: Payer: Self-pay | Admitting: Nurse Practitioner

## 2017-01-07 ENCOUNTER — Ambulatory Visit: Payer: BC Managed Care – PPO | Admitting: Nurse Practitioner

## 2017-01-07 ENCOUNTER — Encounter: Payer: Self-pay | Admitting: Nurse Practitioner

## 2017-01-07 VITALS — BP 124/86 | Ht 65.0 in | Wt 204.0 lb

## 2017-01-07 DIAGNOSIS — Z23 Encounter for immunization: Secondary | ICD-10-CM | POA: Diagnosis not present

## 2017-01-07 DIAGNOSIS — K219 Gastro-esophageal reflux disease without esophagitis: Secondary | ICD-10-CM

## 2017-01-07 DIAGNOSIS — F341 Dysthymic disorder: Secondary | ICD-10-CM

## 2017-01-07 DIAGNOSIS — I10 Essential (primary) hypertension: Secondary | ICD-10-CM | POA: Diagnosis not present

## 2017-01-07 MED ORDER — PHENTERMINE HCL 37.5 MG PO TABS: 37.5000 mg | ORAL_TABLET | Freq: Every day | ORAL | 2 refills | Status: DC

## 2017-01-08 ENCOUNTER — Encounter: Payer: Self-pay | Admitting: Nurse Practitioner

## 2017-01-08 NOTE — Progress Notes (Signed)
Subjective: Presents for recheck on her hypertension.  Compliant with medication.  No palpitations.  No chest pain/ischemic type pain or shortness of breath.  No TIA symptomatology.  Has just joined a gym and plans to start regular activity.  Would like to restart phentermine to help jump start her weight loss.  Has taken this without difficulty in the past.  Reflux stable with famotidine use.  Depression and anxiety have been stable, uses occasional Xanax.  Defers need for daily medication at this time.  Objective:   BP 124/86   Ht 5\' 5"  (1.651 m)   Wt 204 lb (92.5 kg)   BMI 33.95 kg/m  NAD.  Alert, oriented.  Cheerful affect.  Lungs clear.  Heart regular rate and rhythm.  Abdomen soft nondistended nontender.  Assessment:  Essential hypertension, benign  DEPRESSION/ANXIETY  Gastroesophageal reflux disease without esophagitis  Need for vaccination - Plan: Flu Vaccine QUAD 6+ mos PF IM (Fluarix Quad PF)    Plan:   Meds ordered this encounter  Medications  . phentermine (ADIPEX-P) 37.5 MG tablet    Sig: Take 1 tablet (37.5 mg total) daily before breakfast by mouth.    Dispense:  30 tablet    Refill:  2    Order Specific Question:   Supervising Provider    Answer:   Merlyn AlbertLUKING, WILLIAM S [[id number]]   Restart phentermine as directed.  Again reviewed potential adverse effects.  DC med and call if any problems.  Encouraged regular exercise and healthy diet.  Patient was given a refill on Xanax.  Use sparingly.  Continue famotidine as directed as needed reflux.  Recheck here in 3 months if she wishes to continue weight loss medication. Return in about 6 months (around 07/07/2017) for physical.

## 2017-01-10 NOTE — Patient Instructions (Signed)
Clyda HurdleJanice M Springer  01/10/2017     @PREFPERIOPPHARMACY @   Your procedure is scheduled on  03/26/2016   Report to [facility] at   615   A.M.  Call this number if you have problems the morning of surgery:  [phone]   Remember:  Do not eat food or drink liquids after midnight.  Take these medicines the morning of surgery with A SIP OF WATER Pepcid, toprol XL.   Do not wear jewelry, make-up or nail polish.  Do not wear lotions, powders, or perfumes, or deoderant.  Do not shave 48 hours prior to surgery.  Men may shave face and neck.  Do not bring valuables to the hospital.  [facility] not [facility] for any belongings or valuables.  Contacts, dentures or bridgework may not be worn into surgery.  Leave your suitcase in the car.  After surgery it may be brought to your room.  For patients admitted to the hospital, discharge time will be determined by your treatment team.  Patients discharged the day of surgery will not be allowed to drive home.   Name and phone number of your driver:   family Special instructions:  STOP your phentermine until after your surgery.  Please read over the following fact sheets that you were given. Anesthesia Post-op Instructions and Care and Recovery After Surgery       Trigger Finger Trigger finger (stenosing tenosynovitis) is a condition that causes a finger to get stuck in a bent position. Each finger has a tough, cord-like tissue that connects muscle to bone (tendon), and each tendon is surrounded by a tunnel of tissue (tendon sheath). To move your finger, your tendon needs to slide freely through the sheath. Trigger finger happens when the tendon or the sheath thickens, making it difficult to move your finger. Trigger finger can affect any finger or a thumb. It may affect more than one finger. Mild cases may clear up with rest and medicine. Severe cases require more treatment. What are the causes? Trigger finger is caused by a  thickened finger tendon or tendon sheath. The cause of this thickening is not known. What increases the risk? The following factors may make you more likely to develop this condition:  Doing activities that require a strong grip.  Having rheumatoid arthritis, gout, or diabetes.  Being 640-53 years old.  Being a woman.  What are the signs or symptoms? Symptoms of this condition include:  Pain when bending or straightening your finger.  Tenderness or swelling where your finger attaches to the palm of your hand.  A lump in the palm of your hand or on the inside of your finger.  Hearing a popping sound when you try to straighten your finger.  Feeling a popping, catching, or locking sensation when you try to straighten your finger.  Being unable to straighten your finger.  How is this diagnosed? This condition is diagnosed based on your symptoms and a physical exam. How is this treated? This condition may be treated by:  Resting your finger and avoiding activities that make symptoms worse.  Wearing a finger splint to keep your finger in a slightly bent position.  Taking NSAIDs to relieve pain and swelling.  Injecting medicine (steroids) into the tendon sheath to reduce swelling and irritation. Injections may need to be repeated.  Having surgery to open the tendon sheath. This may be done if other treatments do not work and you cannot straighten your finger.  You may need physical therapy after surgery.  Follow these instructions at home:  Use moist heat to help reduce pain and swelling as told by your health care provider.  Rest your finger and avoid activities that make pain worse. Return to normal activities as told by your health care provider.  If you have a splint, wear it as told by your health care provider.  Take over-the-counter and prescription medicines only as told by your health care provider.  Keep all follow-up visits as told by your health care provider.  This is important. Contact a health care provider if:  Your symptoms are not improving with home care. Summary  Trigger finger (stenosing tenosynovitis) causes your finger to get stuck in a bent position, and it can make it difficult and painful to straighten your finger.  This condition develops when a finger tendon or tendon sheath thickens.  Treatment starts with resting, wearing a splint, and taking NSAIDs.  In severe cases, surgery to open the tendon sheath may be needed. This information is not intended to replace advice given to you by your health care provider. Make sure you discuss any questions you have with your health care provider. Document Released: 12/03/2003 Document Revised: 01/24/2016 Document Reviewed: 01/24/2016 Elsevier Interactive Patient Education  2017 Elsevier Inc. Monitored Anesthesia Care Anesthesia is a term that refers to techniques, procedures, and medicines that help a person stay safe and comfortable during a medical procedure. Monitored anesthesia care, or sedation, is one type of anesthesia. Your anesthesia specialist may recommend sedation if you will be having a procedure that does not require you to be unconscious, such as:  Cataract surgery.  A dental procedure.  A biopsy.  A colonoscopy.  During the procedure, you may receive a medicine to help you relax (sedative). There are three levels of sedation:  Mild sedation. At this level, you may feel awake and relaxed. You will be able to follow directions.  Moderate sedation. At this level, you will be sleepy. You may not remember the procedure.  Deep sedation. At this level, you will be asleep. You will not remember the procedure.  The more medicine you are given, the deeper your level of sedation will be. Depending on how you respond to the procedure, the anesthesia specialist may change your level of sedation or the type of anesthesia to fit your needs. An anesthesia specialist will monitor you  closely during the procedure. Let your health care provider know about:  Any allergies you have.  All medicines you are taking, including vitamins, herbs, eye drops, creams, and over-the-counter medicines.  Any use of steroids (by mouth or as a cream).  Any problems you or family members have had with sedatives and anesthetic medicines.  Any blood disorders you have.  Any surgeries you have had.  Any medical conditions you have, such as sleep apnea.  Whether you are pregnant or may be pregnant.  Any use of cigarettes, alcohol, or street drugs. What are the risks? Generally, this is a safe procedure. However, problems may occur, including:  Getting too much medicine (oversedation).  Nausea.  Allergic reaction to medicines.  Trouble breathing. If this happens, a breathing tube may be used to help with breathing. It will be removed when you are awake and breathing on your own.  Heart trouble.  Lung trouble.  Before the procedure Staying hydrated Follow instructions from your health care provider about hydration, which may include:  Up to 2 hours before the procedure - you  may continue to drink clear liquids, such as water, clear fruit juice, black coffee, and plain tea.  Eating and drinking restrictions Follow instructions from your health care provider about eating and drinking, which may include:  8 hours before the procedure - stop eating heavy meals or foods such as meat, fried foods, or fatty foods.  6 hours before the procedure - stop eating light meals or foods, such as toast or cereal.  6 hours before the procedure - stop drinking milk or drinks that contain milk.  2 hours before the procedure - stop drinking clear liquids.  Medicines Ask your health care provider about:  Changing or stopping your regular medicines. This is especially important if you are taking diabetes medicines or blood thinners.  Taking medicines such as aspirin and ibuprofen. These  medicines can thin your blood. Do not take these medicines before your procedure if your health care provider instructs you not to.  Tests and exams  You will have a physical exam.  You may have blood tests done to show: ? How well your kidneys and liver are working. ? How well your blood can clot.  General instructions  Plan to have someone take you home from the hospital or clinic.  If you will be going home right after the procedure, plan to have someone with you for 24 hours.  What happens during the procedure?  Your blood pressure, heart rate, breathing, level of pain and overall condition will be monitored.  An IV tube will be inserted into one of your veins.  Your anesthesia specialist will give you medicines as needed to keep you comfortable during the procedure. This may mean changing the level of sedation.  The procedure will be performed. After the procedure  Your blood pressure, heart rate, breathing rate, and blood oxygen level will be monitored until the medicines you were given have worn off.  Do not drive for 24 hours if you received a sedative.  You may: ? Feel sleepy, clumsy, or nauseous. ? Feel forgetful about what happened after the procedure. ? Have a sore throat if you had a breathing tube during the procedure. ? Vomit. This information is not intended to replace advice given to you by your health care provider. Make sure you discuss any questions you have with your health care provider. Document Released: 11/08/2004 Document Revised: 07/22/2015 Document Reviewed: 06/05/2015 Elsevier Interactive Patient Education  [employer].

## 2017-01-16 ENCOUNTER — Encounter (HOSPITAL_COMMUNITY): Payer: Self-pay

## 2017-01-16 ENCOUNTER — Encounter (HOSPITAL_COMMUNITY)
Admission: RE | Admit: 2017-01-16 | Discharge: 2017-01-16 | Disposition: A | Discharge status: Home / Self Care | Payer: BC Managed Care – PPO | Source: Ambulatory Visit | Attending: Orthopedic Surgery | Admitting: Orthopedic Surgery

## 2017-01-16 ENCOUNTER — Other Ambulatory Visit: Payer: Self-pay

## 2017-01-16 DIAGNOSIS — M65311 Trigger thumb, right thumb: Secondary | ICD-10-CM | POA: Diagnosis not present

## 2017-01-16 LAB — BASIC METABOLIC PANEL
Anion gap: 8 (ref 5–15)
BUN: 16 mg/dL (ref 6–20)
CHLORIDE: 106 mmol/L (ref 101–111)
CO2: 24 mmol/L (ref 22–32)
CREATININE: 0.65 mg/dL (ref 0.44–1.00)
Calcium: 9.3 mg/dL (ref 8.9–10.3)
GFR calc Af Amer: >60 mL/min (ref >60)
GFR calc non Af Amer: >60 mL/min (ref >60)
Glucose, Bld: 103 mg/dL — ABNORMAL HIGH (ref 65–99)
Potassium: 3.8 mmol/L (ref 3.5–5.1)
Sodium: 138 mmol/L (ref 135–145)

## 2017-01-16 LAB — CBC
HEMATOCRIT: 39.5 % (ref 36.0–46.0)
Hemoglobin: 12.8 g/dL (ref 12.0–15.0)
MCH: 29.8 pg (ref 26.0–34.0)
MCHC: 32.4 g/dL (ref 30.0–36.0)
MCV: 92.1 fL (ref 78.0–100.0)
Platelets: 201 10*3/uL (ref 150–400)
RBC: 4.29 MIL/uL (ref 3.87–5.11)
RDW: 12.6 % (ref 11.5–15.5)
WBC: 4.9 10*3/uL (ref 4.0–10.5)

## 2017-01-23 NOTE — H&P (Signed)
Erica HurdleJanice M Graves is an 51 y.o. female.   Chief Complaint: pain right thumb HPI: 51 yo female for rt thumb trigger release  Pain at A! Pulley with catching locking that has failed non operative care   Past Medical History:  Diagnosis Date  . Carpal tunnel syndrome, bilateral   . Chronic low back pain   . Constipation   . Counseling for estrogen replacement therapy 07/07/2014  . Depression with anxiety   . Dyspareunia 07/07/2014  . History of kidney stones   . Hot flashes 07/07/2014  . Menopause 07/07/2014  . Moody 07/07/2014  . Obesity   . Umbilical hernia     Past Surgical History:  Procedure Laterality Date  . ABDOMINAL HYSTERECTOMY    . BACK SURGERY     removal of cyst subdermal  . CARPAL TUNNEL RELEASE Left 09/30/2014   Procedure: CARPAL TUNNEL RELEASE;  Surgeon: Vickki HearingStanley E Harrison, MD;  Location: [facility];  Service: Orthopedics;  Laterality: Left;  . CARPAL TUNNEL RELEASE Right 09/28/2015   Procedure: CARPAL TUNNEL RELEASE;  Surgeon: Vickki HearingStanley E Harrison, MD;  Location: [facility];  Service: Orthopedics;  Laterality: Right;  . COLONOSCOPY N/A 06/29/2013   Procedure: COLONOSCOPY;  Surgeon: West BaliSandi L Fields, MD;  Location: [facility];  Service: Endoscopy;  Laterality: N/A;  1:30  . KNEE ARTHROSCOPY Right   . KNEE ARTHROSCOPY  05/21/2011   Procedure: ARTHROSCOPY KNEE;  Surgeon: Vickki HearingStanley E Harrison, MD;  Location: [facility];  Service: Orthopedics;  Laterality: Left;  lateral menisectomy  . KNEE CLOSED REDUCTION Left 05/26/2012   Procedure: CLOSED MANIPULATION KNEE;  Surgeon: Vickki HearingStanley E Harrison, MD;  Location: [facility];  Service: Orthopedics;  Laterality: Left;  . left knee arthroscopy  08/14/2005 Dr. Romeo AppleHarrison torn meniscus  . LUMBAR SPINE SURGERY    . PARTIAL HYSTERECTOMY    . TOTAL KNEE ARTHROPLASTY  03/31/2012   Procedure: TOTAL KNEE ARTHROPLASTY;  Surgeon: Vickki HearingStanley E Harrison, MD;  Location: [facility];  Service: Orthopedics;  Laterality: Left;  Left Total Knee Arthroplasty    Family History  Problem  Relation Age of Onset  . Heart disease Mother   . Hypertension Mother   . Colon cancer Neg Hx    Social History:  reports that  has never smoked. she has never used smokeless tobacco. She reports that she drinks alcohol. She reports that she does not use drugs.  Allergies:  Allergies  Allergen Reactions  . Norco [Hydrocodone-Acetaminophen] Nausea And Vomiting    No medications prior to admission.    No results found for this or any previous visit (from the past 48 hour(s)). No results found.  Review of Systems  Musculoskeletal: Positive for joint pain.  All other systems reviewed and are negative.   There were no vitals taken for this visit. Physical Exam  Constitutional: She is oriented to person, place, and time. She appears well-nourished.  Eyes: Right eye exhibits no discharge. Left eye exhibits no discharge. No scleral icterus.  Neck: Neck supple. No JVD present. No tracheal deviation present.  Cardiovascular: Intact distal pulses.  Respiratory: Effort normal. No stridor.  GI: Soft. She exhibits no distension.  Musculoskeletal:       Hands: Neurological: She is alert and oriented to person, place, and time. She has normal reflexes. She exhibits normal muscle tone. Coordination normal.  Skin: Skin is warm and dry. No rash noted. No erythema. No pallor.  Psychiatric: She has a normal mood and affect. Her behavior is normal. Thought content normal.  Assessment/Plan Rt trigger thumb   Rt trigger thumb release  [name], MD 01/23/2017, 3:23 PM

## 2017-01-24 ENCOUNTER — Ambulatory Visit (HOSPITAL_COMMUNITY): Payer: BC Managed Care – PPO | Admitting: Anesthesiology

## 2017-01-24 ENCOUNTER — Encounter (HOSPITAL_COMMUNITY): Payer: Self-pay

## 2017-01-24 ENCOUNTER — Encounter (HOSPITAL_COMMUNITY)
Admission: RE | Disposition: A | Discharge status: Home / Self Care | Payer: Self-pay | Source: Ambulatory Visit | Attending: Orthopedic Surgery

## 2017-01-24 ENCOUNTER — Ambulatory Visit (HOSPITAL_COMMUNITY)
Admission: RE | Admit: 2017-01-24 | Discharge: 2017-01-24 | Disposition: A | Discharge status: Home / Self Care | Payer: BC Managed Care – PPO | Source: Ambulatory Visit | Attending: Orthopedic Surgery | Admitting: Orthopedic Surgery

## 2017-01-24 DIAGNOSIS — M65311 Trigger thumb, right thumb: Secondary | ICD-10-CM

## 2017-01-24 DIAGNOSIS — Z885 Allergy status to narcotic agent status: Secondary | ICD-10-CM | POA: Diagnosis not present

## 2017-01-24 DIAGNOSIS — E669 Obesity, unspecified: Secondary | ICD-10-CM | POA: Insufficient documentation

## 2017-01-24 HISTORY — PX: TRIGGER FINGER RELEASE: SHX641

## 2017-01-24 SURGERY — RELEASE, A1 PULLEY, FOR TRIGGER FINGER
Anesthesia: Monitor Anesthesia Care | Site: Thumb | Laterality: Right

## 2017-01-24 MED ORDER — BUPIVACAINE HCL (PF) 0.5 % IJ SOLN
INTRAMUSCULAR | Status: AC
  Filled 2017-01-24: qty 30

## 2017-01-24 MED ORDER — BUPIVACAINE HCL (PF) 0.5 % IJ SOLN
INTRAMUSCULAR | Status: DC | PRN
  Administered 2017-01-24: 10 mL

## 2017-01-24 MED ORDER — PROPOFOL 10 MG/ML IV BOLUS
INTRAVENOUS | Status: AC
  Filled 2017-01-24: qty 40

## 2017-01-24 MED ORDER — FENTANYL CITRATE (PF) 100 MCG/2ML IJ SOLN: 25.0000 ug | INTRAMUSCULAR | Status: DC | PRN

## 2017-01-24 MED ORDER — LIDOCAINE HCL (PF) 0.5 % IJ SOLN
INTRAMUSCULAR | Status: DC | PRN
  Administered 2017-01-24: 50 mL via INTRAVENOUS

## 2017-01-24 MED ORDER — PROPOFOL 10 MG/ML IV BOLUS
INTRAVENOUS | Status: DC | PRN
  Administered 2017-01-24: 10 mg via INTRAVENOUS

## 2017-01-24 MED ORDER — FENTANYL CITRATE (PF) 100 MCG/2ML IJ SOLN
INTRAMUSCULAR | Status: AC
  Filled 2017-01-24: qty 2

## 2017-01-24 MED ORDER — PROPOFOL 500 MG/50ML IV EMUL
INTRAVENOUS | Status: DC | PRN
  Administered 2017-01-24: 50 ug/kg/min via INTRAVENOUS

## 2017-01-24 MED ORDER — IBUPROFEN 800 MG PO TABS: 800.0000 mg | ORAL_TABLET | Freq: Three times a day (TID) | ORAL | 1 refills | Status: DC | PRN

## 2017-01-24 MED ORDER — MIDAZOLAM HCL 2 MG/2ML IJ SOLN
1.0000 mg | INTRAMUSCULAR | Status: AC
  Administered 2017-01-24: 2 mg via INTRAVENOUS

## 2017-01-24 MED ORDER — MIDAZOLAM HCL 2 MG/2ML IJ SOLN
INTRAMUSCULAR | Status: AC
  Filled 2017-01-24: qty 2

## 2017-01-24 MED ORDER — LIDOCAINE HCL (PF) 0.5 % IJ SOLN
INTRAMUSCULAR | Status: AC
  Filled 2017-01-24: qty 50

## 2017-01-24 MED ORDER — CEFAZOLIN SODIUM-DEXTROSE 2-4 GM/100ML-% IV SOLN
2.0000 g | INTRAVENOUS | Status: AC
  Administered 2017-01-24: 2 g via INTRAVENOUS

## 2017-01-24 MED ORDER — CEFAZOLIN SODIUM-DEXTROSE 2-4 GM/100ML-% IV SOLN
INTRAVENOUS | Status: AC
  Filled 2017-01-24: qty 100

## 2017-01-24 MED ORDER — LIDOCAINE HCL (PF) 1 % IJ SOLN
INTRAMUSCULAR | Status: AC
  Filled 2017-01-24: qty 5

## 2017-01-24 MED ORDER — TRAMADOL-ACETAMINOPHEN 37.5-325 MG PO TABS: 1.0000 | ORAL_TABLET | Freq: Four times a day (QID) | ORAL | 0 refills | Status: DC | PRN

## 2017-01-24 MED ORDER — BUPIVACAINE HCL (PF) 0.25 % IJ SOLN
INTRAMUSCULAR | Status: AC
  Filled 2017-01-24: qty 30

## 2017-01-24 MED ORDER — FENTANYL CITRATE (PF) 100 MCG/2ML IJ SOLN
INTRAMUSCULAR | Status: DC | PRN
  Administered 2017-01-24: 50 ug via INTRAVENOUS

## 2017-01-24 MED ORDER — SODIUM CHLORIDE 0.9 % IR SOLN
Status: DC | PRN
  Administered 2017-01-24: 1000 mL

## 2017-01-24 MED ORDER — LACTATED RINGERS IV SOLN
INTRAVENOUS | Status: DC
  Administered 2017-01-24: 07:00:00 via INTRAVENOUS

## 2017-01-24 MED ORDER — CHLORHEXIDINE GLUCONATE 4 % EX LIQD
60.0000 mL | Freq: Once | CUTANEOUS | Status: AC
  Administered 2017-01-24: 4 via TOPICAL

## 2017-01-24 MED ORDER — FENTANYL CITRATE (PF) 100 MCG/2ML IJ SOLN
25.0000 ug | Freq: Once | INTRAMUSCULAR | Status: AC
  Administered 2017-01-24: 25 ug via INTRAVENOUS

## 2017-01-24 SURGICAL SUPPLY — 42 items
BAG HAMPER (MISCELLANEOUS) ×2 IMPLANT
BANDAGE ELASTIC 2 LF NS (GAUZE/BANDAGES/DRESSINGS) ×2 IMPLANT
BANDAGE ESMARK 4X12 BL STRL LF (DISPOSABLE) IMPLANT
BLADE SURG 15 STRL LF DISP TIS (BLADE) ×1 IMPLANT
BLADE SURG 15 STRL SS (BLADE) ×2
BNDG CMPR 12X4 ELC STRL LF (DISPOSABLE) ×1
BNDG CMPR MED 5X2 ELC HKLP NS (GAUZE/BANDAGES/DRESSINGS) ×1
BNDG CONFORM 2 STRL LF (GAUZE/BANDAGES/DRESSINGS) ×1 IMPLANT
BNDG ESMARK 4X12 BLUE STRL LF (DISPOSABLE) ×2
CHLORAPREP W/TINT 26ML (MISCELLANEOUS) ×2 IMPLANT
CLOTH BEACON ORANGE TIMEOUT ST (SAFETY) ×2 IMPLANT
COVER LIGHT HANDLE STERIS (MISCELLANEOUS) ×4 IMPLANT
CUFF TOURNIQUET SINGLE 18IN (TOURNIQUET CUFF) ×2 IMPLANT
CUFF TOURNIQUET SINGLE 24IN (TOURNIQUET CUFF) IMPLANT
DECANTER SPIKE VIAL GLASS SM (MISCELLANEOUS) ×2 IMPLANT
DRAPE HALF SHEET 40X57 (DRAPES) ×2 IMPLANT
DRSG XEROFORM 1X8 (GAUZE/BANDAGES/DRESSINGS) ×1 IMPLANT
ELECT NDL TIP 2.8 STRL (NEEDLE) ×1 IMPLANT
ELECT NEEDLE TIP 2.8 STRL (NEEDLE) ×2 IMPLANT
ELECT REM PT RETURN 9FT ADLT (ELECTROSURGICAL) ×2
ELECTRODE REM PT RTRN 9FT ADLT (ELECTROSURGICAL) ×1 IMPLANT
GAUZE SPONGE 4X4 12PLY STRL (GAUZE/BANDAGES/DRESSINGS) ×2 IMPLANT
GAUZE XEROFORM 1X8 LF (GAUZE/BANDAGES/DRESSINGS) ×2 IMPLANT
GLOVE BIOGEL PI IND STRL 7.0 (GLOVE) ×1 IMPLANT
GLOVE BIOGEL PI INDICATOR 7.0 (GLOVE) ×2
GLOVE SKINSENSE NS SZ8.0 LF (GLOVE) ×1
GLOVE SKINSENSE STRL SZ8.0 LF (GLOVE) ×1 IMPLANT
GLOVE SS N UNI LF 8.5 STRL (GLOVE) ×2 IMPLANT
GOWN STRL REUS W/TWL LRG LVL3 (GOWN DISPOSABLE) ×2 IMPLANT
GOWN STRL REUS W/TWL XL LVL3 (GOWN DISPOSABLE) ×2 IMPLANT
HAND ALUMI XLG (SOFTGOODS) ×2 IMPLANT
KIT ROOM TURNOVER APOR (KITS) ×2 IMPLANT
MANIFOLD NEPTUNE II (INSTRUMENTS) ×2 IMPLANT
NDL HYPO 21X1.5 SAFETY (NEEDLE) IMPLANT
NEEDLE HYPO 21X1.5 SAFETY (NEEDLE) ×2 IMPLANT
NS IRRIG 1000ML POUR BTL (IV SOLUTION) ×2 IMPLANT
PACK BASIC LIMB (CUSTOM PROCEDURE TRAY) ×2 IMPLANT
PAD ARMBOARD 7.5X6 YLW CONV (MISCELLANEOUS) ×2 IMPLANT
SET BASIN LINEN APH (SET/KITS/TRAYS/PACK) ×2 IMPLANT
SPONGE GAUZE 2X2 8PLY STRL LF (GAUZE/BANDAGES/DRESSINGS) IMPLANT
SUT ETHILON 3 0 FSL (SUTURE) ×2 IMPLANT
SYR CONTROL 10ML LL (SYRINGE) ×1 IMPLANT

## 2017-01-24 NOTE — Brief Op Note (Signed)
01/24/2017  8:08 AM  PATIENT:  Erica HurdleJanice M Graves  51 y.o. female  PRE-OPERATIVE DIAGNOSIS:  triggering right thumb  POST-OPERATIVE DIAGNOSIS:  triggering right thumb  PROCEDURE:  Procedure(s): RELEASE TRIGGER FINGER/A-1 PULLEY right thumb (Right)  SURGEON:  Surgeon(s) and Role:    Vickki Hearing* Harrison, Stanley E, MD - Primary  PHYSICIAN ASSISTANT:   ASSISTANTS: none   ANESTHESIA:   regional  EBL:  0  BLOOD ADMINISTERED:none  DRAINS: none   LOCAL MEDICATIONS USED:  MARCAINE     SPECIMEN:  No Specimen  DISPOSITION OF SPECIMEN:  N/A  COUNTS:  YES  TOURNIQUET:   Total Tourniquet Time Documented: Upper Arm (Right) - 27 minutes Total: Upper Arm (Right) - 27 minutes   DICTATION: .Reubin Milanragon Dictation  PLAN OF CARE: Discharge to home after PACU  PATIENT DISPOSITION:  PACU - hemodynamically stable.   Delay start of Pharmacological VTE agent (>24hrs) due to surgical blood loss or risk of bleeding: not applicable  OPERATIVE REPORT   01/24/2017 8:09 AM [name], MD   The procedure was performed as follows  The patient was identified in the preop area and the surgical site was confirmed and marked, chart update was completed patient taken to surgery given appropriate antibiotics based on her allergy profile  After successful Bier block in sterile prep and drape timeout was completed  A longitudinal incision was made over the A1 pulley of the right thumb, the  subcutaneous tissue was divided with  blunt dissection  neurovascular structures were identified and protected . A blunt instrument was placed underneath the A1 pulley and the A1 pulley was released. Flexion extension of the digit confirmed removal of mechanical block. Wound was irrigated and closed with 3-0 nylon suture  We took the patient to recovery room in stable condition  [name], MD

## 2017-01-24 NOTE — Discharge Instructions (Signed)
Moderate Conscious Sedation, Adult, Care After  These instructions provide you with information about caring for yourself after your procedure. Your health care provider may also give you more specific instructions. Your treatment has been planned according to current medical practices, but problems sometimes occur. Call your health care provider if you have any problems or questions after your procedure.  What can I expect after the procedure?  After your procedure, it is common:   To feel sleepy for several hours.   To feel clumsy and have poor balance for several hours.   To have poor judgment for several hours.   To vomit if you eat too soon.    Follow these instructions at home:  For at least 24 hours after the procedure:     Do not:  ? Participate in activities where you could fall or become injured.  ? Drive.  ? Use heavy machinery.  ? Drink alcohol.  ? Take sleeping pills or medicines that cause drowsiness.  ? Make important decisions or sign legal documents.  ? Take care of children on your own.   Rest.  Eating and drinking   Follow the diet recommended by your health care provider.   If you vomit:  ? Drink water, juice, or soup when you can drink without vomiting.  ? Make sure you have little or no nausea before eating solid foods.  General instructions   Have a responsible adult stay with you until you are awake and alert.   Take over-the-counter and prescription medicines only as told by your health care provider.   If you smoke, do not smoke without supervision.   Keep all follow-up visits as told by your health care provider. This is important.  Contact a health care provider if:   You keep feeling nauseous or you keep vomiting.   You feel light-headed.   You develop a rash.   You have a fever.  Get help right away if:   You have trouble breathing.  This information is not intended to replace advice given to you by your health care provider. Make sure you discuss any questions you have  with your health care provider.  Document Released: 12/03/2012 Document Revised: 07/18/2015 Document Reviewed: 06/04/2015  Elsevier Interactive Patient Education  2018 [employer]

## 2017-01-24 NOTE — Anesthesia Preprocedure Evaluation (Addendum)
Anesthesia Evaluation  Patient identified by MRN, date of birth, ID band Patient awake    Reviewed: Allergy & Precautions, H&P , NPO status , Patient's Chart, lab work & pertinent test results  History of Anesthesia Complications Negative for: history of anesthetic complications  Airway Mallampati: II       Dental  (+) Teeth Intact   Pulmonary    breath sounds clear to auscultation       Cardiovascular hypertension, Pt. on medications negative cardio ROS   Rhythm:Regular Rate:Normal     Neuro/Psych  Headaches, PSYCHIATRIC DISORDERS Anxiety Depression  Neuromuscular disease    GI/Hepatic negative GI ROS, Neg liver ROS, GERD  Controlled,  Endo/Other  negative endocrine ROS  Renal/GU Renal disease     Musculoskeletal  (+) Arthritis  (chronic LBP),   Abdominal (+) + obese,  Abdomen: soft.    Peds  Hematology negative hematology ROS (+)   Anesthesia Other Findings   Reproductive/Obstetrics                            Anesthesia Physical Anesthesia Plan  ASA: II  Anesthesia Plan: MAC and Bier Block and Bier Block-LIDOCAINE ONLY   Post-op Pain Management:    Induction: Intravenous  PONV Risk Score and Plan:   Airway Management Planned: Simple Face Mask  Additional Equipment:   Intra-op Plan:   Post-operative Plan:   Informed Consent: I have reviewed the patients History and Physical, chart, labs and discussed the procedure including the risks, benefits and alternatives for the proposed anesthesia with the patient or authorized representative who has indicated his/her understanding and acceptance.     Plan Discussed with:   Anesthesia Plan Comments:         Anesthesia Quick Evaluation

## 2017-01-24 NOTE — Anesthesia Procedure Notes (Signed)
Anesthesia Regional Block: Bier block (IV Regional)   Pre-Anesthetic Checklist: ,, timeout performed, Correct Patient, Correct Site, Correct Laterality, Correct Procedure,, site marked, surgical consent,, at surgeon's request  Laterality: Right and Upper     Needles:  Injection technique: Single-shot  Needle Type: Other      Needle Gauge: 22     Additional Needles:   Procedures:,,,,, intact distal pulses, Esmarch exsanguination, single tourniquet utilized,   Nerve Stimulator or Paresthesia:   Additional Responses:  Pulse checked post tourniquet inflation. IV NSL discontinued post injection. Narrative:   Performed by: Personally       

## 2017-01-24 NOTE — Transfer of Care (Signed)
Immediate Anesthesia Transfer of Care Note  Patient: Erica Graves  Procedure(s) Performed: RELEASE TRIGGER FINGER/A-1 PULLEY right thumb (Right Thumb)  Patient Location: PACU  Anesthesia Type:MAC and Bier block  Level of Consciousness: awake, alert  and oriented  Airway & Oxygen Therapy: Patient Spontanous Breathing  Post-op Assessment: Report given to RN  Post vital signs: Reviewed and stable  Last Vitals:  Vitals:   01/24/17 0640  BP: (!) 163/95  Pulse: 61  Temp: 36.6 C  SpO2: 98%    Last Pain:  Vitals:   01/24/17 0640  TempSrc: Oral  PainSc: 4          Complications: No apparent anesthesia complications

## 2017-01-24 NOTE — Anesthesia Postprocedure Evaluation (Signed)
Anesthesia Post Note  Patient: Erica Graves  Procedure(s) Performed: RELEASE TRIGGER FINGER/A-1 PULLEY right thumb (Right Thumb)  Patient location during evaluation: PACU Anesthesia Type: MAC Level of consciousness: awake and alert and oriented Pain management: pain level controlled Vital Signs Assessment: post-procedure vital signs reviewed and stable Respiratory status: spontaneous breathing Cardiovascular status: stable and blood pressure returned to baseline Postop Assessment: no apparent nausea or vomiting Anesthetic complications: no     Last Vitals:  Vitals:   01/24/17 0853 01/24/17 0903  BP: (!) 163/98 (!) 172/85  Pulse: (!) 57 (!) 53  Resp: 17 17  Temp:  36.4 C  SpO2: 97% 100%    Last Pain:  Vitals:   01/24/17 0903  TempSrc: Oral  PainSc:                  Tressie Stalker

## 2017-01-24 NOTE — Interval H&P Note (Signed)
History and Physical Interval Note:  01/24/2017 7:17 AM  Erica Graves  has presented today for surgery, with the diagnosis of triggering right thumb  The various methods of treatment have been discussed with the patient and family. After consideration of risks, benefits and other options for treatment, the patient has consented to  Procedure(s): RELEASE TRIGGER FINGER/A-1 PULLEY right thumb (Right) as a surgical intervention .  The patient's history has been reviewed, patient examined, no change in status, stable for surgery.  I have reviewed the patient's chart and labs.  Questions were answered to the patient's satisfaction.     [name]

## 2017-01-24 NOTE — Op Note (Signed)
01/24/2017  8:08 AM  PATIENT:  Erica Graves  51 y.o. female  PRE-OPERATIVE DIAGNOSIS:  triggering right thumb  POST-OPERATIVE DIAGNOSIS:  triggering right thumb  PROCEDURE:  Procedure(s): RELEASE TRIGGER FINGER/A-1 PULLEY right thumb (Right)  Findings: stenosing tenosynovitis of A1 pulley   SURGEON:  Surgeon(s) and Role:    Vickki Hearing* Juanita Streight E, MD - Primary  PHYSICIAN ASSISTANT:   ASSISTANTS: none   ANESTHESIA:   regional  EBL:  0  BLOOD ADMINISTERED:none  DRAINS: none   LOCAL MEDICATIONS USED:  MARCAINE     SPECIMEN:  No Specimen  DISPOSITION OF SPECIMEN:  N/A  COUNTS:  YES  TOURNIQUET:   Total Tourniquet Time Documented: Upper Arm (Right) - 27 minutes Total: Upper Arm (Right) - 27 minutes   DICTATION: .Reubin Milanragon Dictation  PLAN OF CARE: Discharge to home after PACU  PATIENT DISPOSITION:  PACU - hemodynamically stable.   Delay start of Pharmacological VTE agent (>24hrs) due to surgical blood loss or risk of bleeding: not applicable  OPERATIVE REPORT   01/24/2017 8:09 AM Fuller CanadaStanley Yael Angerer, MD   The procedure was performed as follows  The patient was identified in the preop area and the surgical site was confirmed and marked, chart update was completed patient taken to surgery given appropriate antibiotics based on her allergy profile  After successful Bier block in sterile prep and drape timeout was completed  A longitudinal incision was made over the A1 pulley of the right thumb, the  subcutaneous tissue was divided with  blunt dissection  neurovascular structures were identified and protected . A blunt instrument was placed underneath the A1 pulley and the A1 pulley was released. Flexion extension of the digit confirmed removal of mechanical block. Wound was irrigated and closed with 3-0 nylon suture  We took the patient to recovery room in stable condition  Fuller CanadaStanley Genise Strack, MD

## 2017-01-25 ENCOUNTER — Encounter (HOSPITAL_COMMUNITY): Payer: Self-pay | Admitting: Orthopedic Surgery

## 2017-02-06 ENCOUNTER — Ambulatory Visit (INDEPENDENT_AMBULATORY_CARE_PROVIDER_SITE_OTHER): Payer: BC Managed Care – PPO | Admitting: Orthopedic Surgery

## 2017-02-06 VITALS — BP 163/103 | HR 65 | Ht 62.0 in | Wt 200.0 lb

## 2017-02-06 DIAGNOSIS — Z4889 Encounter for other specified surgical aftercare: Secondary | ICD-10-CM

## 2017-02-06 DIAGNOSIS — Z9889 Other specified postprocedural states: Secondary | ICD-10-CM | POA: Insufficient documentation

## 2017-02-06 NOTE — Progress Notes (Signed)
POST OP   Chief Complaint  Patient presents with  . Follow-up    Post op recheck on right hand, DOS 01-24-17.    This is postop day #13  Status post trigger release right thumb    The wound is clean dry and intact the sutures can be removed.  Patient complains of no numbness or tingling and no catching or locking  She will follow-up as needed

## 2017-03-12 ENCOUNTER — Other Ambulatory Visit: Payer: Self-pay

## 2017-03-12 ENCOUNTER — Encounter (HOSPITAL_COMMUNITY): Payer: Self-pay | Admitting: Emergency Medicine

## 2017-03-12 ENCOUNTER — Emergency Department (HOSPITAL_COMMUNITY)
Admission: EM | Admit: 2017-03-12 | Discharge: 2017-03-12 | Disposition: A | Discharge status: Home / Self Care | Payer: BC Managed Care – PPO | Attending: Emergency Medicine | Admitting: Emergency Medicine

## 2017-03-12 ENCOUNTER — Emergency Department (HOSPITAL_COMMUNITY): Payer: BC Managed Care – PPO

## 2017-03-12 DIAGNOSIS — I10 Essential (primary) hypertension: Secondary | ICD-10-CM | POA: Insufficient documentation

## 2017-03-12 DIAGNOSIS — B9789 Other viral agents as the cause of diseases classified elsewhere: Secondary | ICD-10-CM

## 2017-03-12 DIAGNOSIS — Z79899 Other long term (current) drug therapy: Secondary | ICD-10-CM | POA: Insufficient documentation

## 2017-03-12 DIAGNOSIS — R05 Cough: Secondary | ICD-10-CM | POA: Diagnosis present

## 2017-03-12 DIAGNOSIS — Z96659 Presence of unspecified artificial knee joint: Secondary | ICD-10-CM | POA: Insufficient documentation

## 2017-03-12 DIAGNOSIS — J069 Acute upper respiratory infection, unspecified: Secondary | ICD-10-CM

## 2017-03-12 MED ORDER — PROMETHAZINE-DM 6.25-15 MG/5ML PO SYRP: 5.0000 mL | ORAL_SOLUTION | Freq: Four times a day (QID) | ORAL | 0 refills | Status: DC | PRN

## 2017-03-12 MED ORDER — SALINE SPRAY 0.65 % NA SOLN: 1.0000 | NASAL | 0 refills | Status: DC | PRN

## 2017-03-12 NOTE — Discharge Instructions (Signed)
As discussed, make sure that you stay well-hydrated and use cough medicine as needed.  Tea with honey, cough drops, throat sprays over-the-counter can help soothe your throat to reduce irritation.  Follow up with your primary care provider.  Return sooner if symptoms worsen or new concerning symptoms in the meantime.

## 2017-03-12 NOTE — ED Provider Notes (Signed)
[facility] EMERGENCY DEPARTMENT Provider Note   CSN: [id number] Arrival date & time: 03/12/17  1153     History   Chief Complaint Chief Complaint  Patient presents with  . Cough    HPI Erica Graves is a 52 y.o. female with no significant past medical history presenting with 4 days of progressively worsening cough and scratchy throat.  Denies any fever but experienced chills when this started 4 days ago.  Also reports frontal sinus pressure and pain.  Known ill contacts with son with upper respiratory infection last week.  She has tried Alka-Seltzer, Tylenol and Mucinex with modest relief.  Denies any nausea/vomiting/diarrhea.  HPI  Past Medical History:  Diagnosis Date  . Carpal tunnel syndrome, bilateral   . Chronic low back pain   . Constipation   . Counseling for estrogen replacement therapy 07/07/2014  . Depression with anxiety   . Dyspareunia 07/07/2014  . History of kidney stones   . Hot flashes 07/07/2014  . Menopause 07/07/2014  . Moody 07/07/2014  . Obesity   . Umbilical hernia     Patient Active Problem List   Diagnosis Date Noted  . S/P trigger finger release 01/24/17 02/06/2017  . Trigger finger of right thumb   . Essential hypertension, benign 11/18/2015  . Gastroesophageal reflux disease without esophagitis 05/06/2015  . Irritable bowel syndrome with constipation 05/06/2015  . Hot flashes 07/07/2014  . Dyspareunia 07/07/2014  . Moody 07/07/2014  . Menopause 07/07/2014  . Counseling for estrogen replacement therapy 07/07/2014  . Morbid obesity (HCC) 03/05/2014  . Carpal tunnel syndrome of right wrist 12/22/2013  . Vitamin D deficiency 10/14/2013  . Migraine headache without aura 11/24/2012  . Muscle contraction headache 11/24/2012  . CTS (carpal tunnel syndrome) 08/14/2012  . Stiffness of joint, not elsewhere classified, lower leg 04/22/2012  . Weakness of left leg 04/22/2012  . Difficulty walking 04/22/2012  . S/P total knee replacement  04/03/2012  . Arthritis of knee, degenerative 03/05/2012  . Osteoarthritis of left knee 12/12/2011  . S/P arthroscopy of left knee 06/13/2011  . Lateral meniscus tear 06/13/2011  . Stiffness of knee joint 06/05/2011  . Medial meniscus, posterior horn derangement 05/16/2011  . DEGENERATIVE JOINT DISEASE, KNEES, BILATERAL 12/19/2009  . 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 FOOT PAIN, LEFT 07/22/2007  . DEPRESSION/ANXIETY 12/27/2006  . SHOULDER PAIN, LEFT 12/27/2006  . OVERWEIGHT 11/12/2006  . CONSTIPATION 11/12/2006  . Lumbago 11/12/2006    Past Surgical History:  Procedure Laterality Date  . ABDOMINAL HYSTERECTOMY    . BACK SURGERY     removal of cyst subdermal  . CARPAL TUNNEL RELEASE Left 09/30/2014   Procedure: CARPAL TUNNEL RELEASE;  Surgeon: Vickki Hearing, MD;  Location: [facility];  Service: Orthopedics;  Laterality: Left;  . CARPAL TUNNEL RELEASE Right 09/28/2015   Procedure: CARPAL TUNNEL RELEASE;  Surgeon: Vickki Hearing, MD;  Location: [facility];  Service: Orthopedics;  Laterality: Right;  . COLONOSCOPY N/A 06/29/2013   Procedure: COLONOSCOPY;  Surgeon: West Bali, MD;  Location: [facility];  Service: Endoscopy;  Laterality: N/A;  1:30  . KNEE ARTHROSCOPY Right   . KNEE ARTHROSCOPY  05/21/2011   Procedure: ARTHROSCOPY KNEE;  Surgeon: Vickki Hearing, MD;  Location: [facility];  Service: Orthopedics;  Laterality: Left;  lateral menisectomy  . KNEE CLOSED REDUCTION Left 05/26/2012   Procedure: CLOSED MANIPULATION KNEE;  Surgeon: Vickki Hearing, MD;  Location: [facility];  Service: Orthopedics;  Laterality: Left;  . left knee  arthroscopy  08/14/2005 Dr. Romeo AppleHarrison torn meniscus  . LUMBAR SPINE SURGERY    . PARTIAL HYSTERECTOMY    . TOTAL KNEE ARTHROPLASTY  03/31/2012   Procedure: TOTAL KNEE ARTHROPLASTY;  Surgeon: Vickki HearingStanley E Harrison, MD;  Location: [facility];  Service: Orthopedics;  Laterality: Left;  Left Total Knee Arthroplasty  . TRIGGER FINGER RELEASE Right 01/24/2017    Procedure: RELEASE TRIGGER FINGER/A-1 PULLEY right thumb;  Surgeon: Vickki HearingHarrison, Stanley E, MD;  Location: [facility];  Service: Orthopedics;  Laterality: Right;    OB History    Gravida Para Term Preterm AB Living   3 3           SAB TAB Ectopic Multiple Live Births                   Home Medications    Prior to Admission medications   Medication Sig Start Date End Date Taking? Authorizing Provider  ALPRAZolam Prudy Feeler(XANAX) 0.5 MG tablet TAKE 1 TABLET BY MOUTH EVERY NIGHT AT BEDTIME AS NEEDED FOR SLEEP 01/07/17  Yes Sherie DonHoskins, Carolyn C, NP  cholecalciferol (VITAMIN D) 1000 units tablet Take 1,000 Units by mouth daily.   Yes [provider]  ibuprofen (ADVIL,MOTRIN) 800 MG tablet Take 1 tablet (800 mg total) by mouth every 8 (eight) hours as needed. Patient not taking: Reported on 03/12/2017 01/24/17   Vickki HearingHarrison, Stanley E, MD  promethazine-dextromethorphan (PROMETHAZINE-DM) 6.25-15 MG/5ML syrup Take 5 mLs by mouth 4 (four) times daily as needed for cough. 03/12/17   Mathews RobinsonsMitchell, Aldyn Toon B, PA-C  sodium chloride (OCEAN) 0.65 % SOLN nasal spray Place 1 spray into both nostrils as needed for congestion. 03/12/17   Georgiana ShoreMitchell, Shaquera Ansley B, PA-C  traMADol-acetaminophen (ULTRACET) 37.5-325 MG tablet Take 1 tablet by mouth every 6 (six) hours as needed. Patient not taking: Reported on 03/12/2017 01/24/17   Vickki HearingHarrison, Stanley E, MD    Family History Family History  Problem Relation Age of Onset  . Heart disease Mother   . Hypertension Mother   . Colon cancer Neg Hx     Social History Social History   Tobacco Use  . Smoking status: Never Smoker  . Smokeless tobacco: Never Used  Substance Use Topics  . Alcohol use: Yes    Comment: occ  . Drug use: No     Allergies   Norco [hydrocodone-acetaminophen]   Review of Systems Review of Systems  Constitutional: Positive for chills. Negative for fever.  HENT: Positive for congestion, sinus pressure and sinus pain. Negative for ear discharge, ear  pain, tinnitus, trouble swallowing and voice change.   Respiratory: Positive for cough. Negative for choking, chest tightness, shortness of breath, wheezing and stridor.   Cardiovascular: Negative for chest pain and palpitations.  Gastrointestinal: Negative for diarrhea, nausea and vomiting.  Genitourinary: Negative for difficulty urinating.  Musculoskeletal: Negative for arthralgias, joint swelling, myalgias, neck pain and neck stiffness.  Skin: Negative for color change, pallor and rash.     Physical Exam Updated Vital Signs BP (!) 153/106 (BP Location: Right Arm)   Pulse 93   Temp 98.7 F (37.1 C) (Oral)   Resp 19   Ht 5\' 5"  (1.651 m)   Wt 86.2 kg (190 lb)   SpO2 100%   BMI 31.62 kg/m   Physical Exam  Constitutional: She is oriented to person, place, and time. She appears well-developed and well-nourished. No distress.  Afebrile, nontoxic-appearing, sitting comfortably in bed no acute distress.  HENT:  Head: Normocephalic and atraumatic.  Right Ear: External ear  normal.  Left Ear: External ear normal.  Mouth/Throat: Oropharynx is clear and moist. No oropharyngeal exudate.  Tender to palpation of the frontal maxillary sinuses.  Normal tympanic membranes bilaterally.  Eyes: Conjunctivae and EOM are normal. Pupils are equal, round, and reactive to light. Right eye exhibits no discharge. Left eye exhibits no discharge.  Neck: Normal range of motion. Neck supple.  Cardiovascular: Normal rate, regular rhythm and normal heart sounds.  No murmur heard. Pulmonary/Chest: Effort normal and breath sounds normal. No stridor. No respiratory distress. She has no wheezes. She has no rales.  Abdominal: She exhibits no distension.  Musculoskeletal: Normal range of motion. She exhibits no edema.  Neurological: She is alert and oriented to person, place, and time.  Skin: Skin is warm and dry. No rash noted. She is not diaphoretic. No erythema. No pallor.  Psychiatric: She has a normal mood  and affect.  Nursing note and vitals reviewed.    ED Treatments / Results  Labs (all labs ordered are listed, but only abnormal results are displayed) Labs Reviewed - No data to display  EKG  EKG Interpretation None       Radiology Dg Chest 2 View  Result Date: 03/12/2017 CLINICAL DATA:  Productive cough for 4 days with chest pain and shortness of breath. EXAM: CHEST  2 VIEW COMPARISON:  06/18/2016 FINDINGS: The cardiac silhouette is normal in size and configuration. Normal mediastinal and hilar contours. Mild stable scarring in the left upper lobe lingula, at the lung base. Lungs are otherwise clear. No pleural effusion or pneumothorax. Skeletal structures are intact. IMPRESSION: No active cardiopulmonary disease. Electronically Signed   By: Amie Portland M.D.   On: 03/12/2017 12:26    Procedures Procedures (including critical care time)  Medications Ordered in ED Medications - No data to display   Initial Impression / Assessment and Plan / ED Course  I have reviewed the triage vital signs and the nursing notes.  Pertinent labs & imaging results that were available during my care of the patient were reviewed by me and considered in my medical decision making (see chart for details).    Pt CXR negative for acute infiltrate. Patients symptoms are consistent with URI, likely viral etiology. Discussed that antibiotics are not indicated for viral infections. Pt will be discharged with symptomatic treatment.  Verbalizes understanding and is agreeable with plan. Pt is hemodynamically stable & in NAD prior to dc.  Discharge home with symptomatic relief and close follow-up with PCP.  Discussed strict return precautions and advised to return to the emergency department if experiencing any new or worsening symptoms. Instructions were understood and patient agreed with discharge plan.  Final Clinical Impressions(s) / ED Diagnoses   Final diagnoses:  Viral URI with cough    ED  Discharge Orders        Ordered    promethazine-dextromethorphan (PROMETHAZINE-DM) 6.25-15 MG/5ML syrup  4 times daily PRN     03/12/17 1541    sodium chloride (OCEAN) 0.65 % SOLN nasal spray  As needed     03/12/17 1541       Gregary Cromer 03/12/17 1548    Bethann Berkshire, MD 03/12/17 [phone]

## 2017-03-12 NOTE — ED Triage Notes (Signed)
Pt c/o of productive cough, congestion and sore throat x4 days. No fever.

## 2017-04-22 ENCOUNTER — Encounter: Payer: Self-pay | Admitting: Nurse Practitioner

## 2017-04-22 ENCOUNTER — Ambulatory Visit: Payer: BC Managed Care – PPO | Admitting: Nurse Practitioner

## 2017-04-22 VITALS — BP 138/86 | Ht 65.0 in | Wt 204.2 lb

## 2017-04-22 DIAGNOSIS — R809 Proteinuria, unspecified: Secondary | ICD-10-CM | POA: Diagnosis not present

## 2017-04-22 DIAGNOSIS — M5442 Lumbago with sciatica, left side: Secondary | ICD-10-CM

## 2017-04-22 LAB — POCT URINALYSIS DIPSTICK
Spec Grav, UA: >=1.03 — AB (ref 1.010–1.025)
pH, UA: 5 (ref 5.0–8.0)

## 2017-04-22 MED ORDER — MELOXICAM 7.5 MG PO TABS
ORAL_TABLET | ORAL | 0 refills | Status: DC
Start: 2017-04-22 — End: 2017-06-12

## 2017-04-22 NOTE — Patient Instructions (Signed)
Restart your TENS unit Lidocaine patch   Low Back Sprain Rehab Ask your health care provider which exercises are safe for you. Do exercises exactly as told by your health care provider and adjust them as directed. It is normal to feel mild stretching, pulling, tightness, or discomfort as you do these exercises, but you should stop right away if you feel sudden pain or your pain gets worse. Do not begin these exercises until told by your health care provider. Stretching and range of motion exercises These exercises warm up your muscles and joints and improve the movement and flexibility of your back. These exercises also help to relieve pain, numbness, and tingling. Exercise A: Lumbar rotation  1. Lie on your back on a firm surface and bend your knees. 2. Straighten your arms out to your sides so each arm forms an "L" shape with a side of your body (a 90 degree angle). 3. Slowly move both of your knees to one side of your body until you feel a stretch in your lower back. Try not to let your shoulders move off of the floor. 4. Hold for __________ seconds. 5. Tense your abdominal muscles and slowly move your knees back to the starting position. 6. Repeat this exercise on the other side of your body. Repeat __________ times. Complete this exercise __________ times a day. Exercise B: Prone extension on elbows  1. Lie on your abdomen on a firm surface. 2. Prop yourself up on your elbows. 3. Use your arms to help lift your chest up until you feel a gentle stretch in your abdomen and your lower back. ? This will place some of your body weight on your elbows. If this is uncomfortable, try stacking pillows under your chest. ? Your hips should stay down, against the surface that you are lying on. Keep your hip and back muscles relaxed. 4. Hold for __________ seconds. 5. Slowly relax your upper body and return to the starting position. Repeat __________ times. Complete this exercise __________ times a  day. Strengthening exercises These exercises build strength and endurance in your back. Endurance is the ability to use your muscles for a long time, even after they get tired. Exercise C: Pelvic tilt 1. Lie on your back on a firm surface. Bend your knees and keep your feet flat. 2. Tense your abdominal muscles. Tip your pelvis up toward the ceiling and flatten your lower back into the floor. ? To help with this exercise, you may place a small towel under your lower back and try to push your back into the towel. 3. Hold for __________ seconds. 4. Let your muscles relax completely before you repeat this exercise. Repeat __________ times. Complete this exercise __________ times a day. Exercise D: Alternating arm and leg raises  1. Get on your hands and knees on a firm surface. If you are on a hard floor, you may want to use padding to cushion your knees, such as an exercise mat. 2. Line up your arms and legs. Your hands should be below your shoulders, and your knees should be below your hips. 3. Lift your left leg behind you. At the same time, raise your right arm and straighten it in front of you. ? Do not lift your leg higher than your hip. ? Do not lift your arm higher than your shoulder. ? Keep your abdominal and back muscles tight. ? Keep your hips facing the ground. ? Do not arch your back. ? Keep your balance carefully,  and do not hold your breath. 4. Hold for __________ seconds. 5. Slowly return to the starting position and repeat with your right leg and your left arm. Repeat __________ times. Complete this exercise __________ times a day. Exercise E: Abdominal set with straight leg raise  1. Lie on your back on a firm surface. 2. Bend one of your knees and keep your other leg straight. 3. Tense your abdominal muscles and lift your straight leg up, 4-6 inches (10-15 cm) off the ground. 4. Keep your abdominal muscles tight and hold for __________ seconds. ? Do not hold your  breath. ? Do not arch your back. Keep it flat against the ground. 5. Keep your abdominal muscles tense as you slowly lower your leg back to the starting position. 6. Repeat with your other leg. Repeat __________ times. Complete this exercise __________ times a day. Posture and body mechanics  Body mechanics refers to the movements and positions of your body while you do your daily activities. Posture is part of body mechanics. Good posture and healthy body mechanics can help to relieve stress in your body's tissues and joints. Good posture means that your spine is in its natural S-curve position (your spine is neutral), your shoulders are pulled back slightly, and your head is not tipped forward. The following are general guidelines for applying improved posture and body mechanics to your everyday activities. Standing   When standing, keep your spine neutral and your feet about hip-width apart. Keep a slight bend in your knees. Your ears, shoulders, and hips should line up.  When you do a task in which you stand in one place for a long time, place one foot up on a stable object that is 2-4 inches (5-10 cm) high, such as a footstool. This helps keep your spine neutral. Sitting   When sitting, keep your spine neutral and keep your feet flat on the floor. Use a footrest, if necessary, and keep your thighs parallel to the floor. Avoid rounding your shoulders, and avoid tilting your head forward.  When working at a desk or a computer, keep your desk at a height where your hands are slightly lower than your elbows. Slide your chair under your desk so you are close enough to maintain good posture.  When working at a computer, place your monitor at a height where you are looking straight ahead and you do not have to tilt your head forward or downward to look at the screen. Resting   When lying down and resting, avoid positions that are most painful for you.  If you have pain with activities such  as sitting, bending, stooping, or squatting (flexion-based activities), lie in a position in which your body does not bend very much. For example, avoid curling up on your side with your arms and knees near your chest (fetal position).  If you have pain with activities such as standing for a long time or reaching with your arms (extension-based activities), lie with your spine in a neutral position and bend your knees slightly. Try the following positions:  Lying on your side with a pillow between your knees.  Lying on your back with a pillow under your knees. Lifting   When lifting objects, keep your feet at least shoulder-width apart and tighten your abdominal muscles.  Bend your knees and hips and keep your spine neutral. It is important to lift using the strength of your legs, not your back. Do not lock your knees straight out.  Always ask for help to lift heavy or awkward objects. This information is not intended to replace advice given to you by your health care provider. Make sure you discuss any questions you have with your health care provider. Document Released: 02/12/2005 Document Revised: 10/20/2015 Document Reviewed: 11/24/2014 Elsevier Interactive Patient Education  [employer].

## 2017-04-23 ENCOUNTER — Encounter: Payer: Self-pay | Admitting: Nurse Practitioner

## 2017-04-23 NOTE — Progress Notes (Signed)
Subjective:  Presents for c/o low back pain that began late November, early December. No specific history of injury. Works in a [employer] requiring frequent lifting at most 45 lbs. Pain eases up on the weekends when she rests her back. Pain now radiating down her left buttock into the posterior left leg to just above the knee. Occasional the leg gives way. Worse with sitting or prolonged standing. Slight relief with Ibuprofen and heat.  A review of the chart after the visit shows a visit with Dr. Aline Brochure for DDD on 01/02/11 following a CT myelogram ordered by Dr. Ashok Pall. Had epidural steroid injection at that time. The involved area is the same as today's presenting symptoms. Has a history of OA and left total knee replacement.   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 At end of visit, also mentions she had some protein. No vaginal discharge or pelvic pain. No dysuria, frequency, urgency or incontinence.    Objective:   BP 138/86   Ht _0  (1.651 m)   Wt 204 lb 3.2 oz (92.6 kg)   BMI 33.98 kg/m  NAD. Alert, oriented. Lungs clear. Heart RRR. Minimal tenderness in the mid back area or right lumbar area. Distinct tenderness with palpation of the left lumbar area into the left buttock. Pos SLR left, neg right. Reflexes lower extremities mildly diminished on left as compared to right, but difficulty assessing patellar reflexes due to knee replacement. Can perform full active ROM of the spine with tenderness mainly with hyperextension and lateral movement. Gait normal.  CT myelogram results 2012: L4-5: Mild bulge slightly greater left lateral position  approaching but not compressing the exiting left L4 nerve root.  Facet joint degenerative changes greater on the left. Results for orders placed or performed in visit on 04/22/17  POCT Urinalysis Dipstick  Result Value Ref Range   Color, UA     Clarity, UA     Glucose, UA     Bilirubin, UA      Ketones, UA     Spec Grav, UA >=1.030 (A) 1.010 - 1.025   Blood, UA     pH, UA 5.0 5.0 - 8.0   Protein, UA +++    Urobilinogen, UA  0.2 or 1.0 E.U./dL   Nitrite, UA     Leukocytes, UA  Negative   Appearance     Odor     Met 7 dated 01/16/17: normal GFR, BUN and creatinine.   Assessment:  Acute left-sided low back pain with left-sided sciatica - Plan: POCT Urinalysis Dipstick, DG Lumbar Spine Complete  Asymptomatic proteinuria - Plan: POCT Urinalysis Dipstick    Plan:   Meds ordered this encounter  Medications  . meloxicam (MOBIC) 7.5 MG tablet    Sig: One po BID prn pain    Dispense:  30 tablet    Refill:  0    Order Specific Question:   Supervising Provider    Answer:   Mikey Kirschner [[id number]]   Patient cannot modify her job or take time off work at this point. Restart TENS unit. Lidocaine patch as directed. Heat applications. Back exercises. Recommend weight loss. Call back in 10-14 days if no improvement.  After visit: plan to obtain a urine protein to creatinine ratio based on persistent protein in urine. Further follow up based on results.  25 minutes was spent with the patient.  This statement verifies that 25 minutes was indeed spent with the  patient. Greater than half the time was spent in discussion, counseling and answering questions  regarding the issues that the patient came in for today as reflected in the diagnosis (s) please refer to documentation for further details.

## 2017-04-23 NOTE — Progress Notes (Signed)
I placed the order in epic.Left message on vm to r/c.

## 2017-04-23 NOTE — Addendum Note (Signed)
Addended by: Meredith LeedsSUTTON, CRYSTAL L on: 04/23/2017 04:05 PM   Modules accepted: Orders

## 2017-05-03 NOTE — Progress Notes (Signed)
I called and left a message to r/c. 

## 2017-05-03 NOTE — Progress Notes (Signed)
Patient is aware 

## 2017-05-05 LAB — PROTEIN / CREATININE RATIO, URINE
Creatinine, Urine: 149.5 mg/dL
Protein, Ur: 13.3 mg/dL
Protein/Creat Ratio: 89 mg/g creat (ref 0–200)

## 2017-06-04 ENCOUNTER — Emergency Department (HOSPITAL_COMMUNITY): Payer: BC Managed Care – PPO

## 2017-06-04 ENCOUNTER — Emergency Department (HOSPITAL_COMMUNITY)
Admission: EM | Admit: 2017-06-04 | Discharge: 2017-06-04 | Disposition: A | Discharge status: Home / Self Care | Payer: BC Managed Care – PPO | Attending: Emergency Medicine | Admitting: Emergency Medicine

## 2017-06-04 ENCOUNTER — Encounter (HOSPITAL_COMMUNITY): Payer: Self-pay | Admitting: *Deleted

## 2017-06-04 ENCOUNTER — Other Ambulatory Visit: Payer: Self-pay

## 2017-06-04 DIAGNOSIS — M1711 Unilateral primary osteoarthritis, right knee: Secondary | ICD-10-CM | POA: Diagnosis not present

## 2017-06-04 DIAGNOSIS — Z79899 Other long term (current) drug therapy: Secondary | ICD-10-CM | POA: Insufficient documentation

## 2017-06-04 DIAGNOSIS — M25561 Pain in right knee: Secondary | ICD-10-CM | POA: Diagnosis present

## 2017-06-04 MED ORDER — TRAMADOL HCL 50 MG PO TABS: 50.0000 mg | ORAL_TABLET | Freq: Four times a day (QID) | ORAL | 0 refills | Status: DC | PRN

## 2017-06-04 MED ORDER — ONDANSETRON 4 MG PO TBDP
4.0000 mg | ORAL_TABLET | Freq: Once | ORAL | Status: AC
  Administered 2017-06-04: 4 mg via ORAL
  Filled 2017-06-04: qty 1

## 2017-06-04 MED ORDER — IBUPROFEN 800 MG PO TABS: 800.0000 mg | ORAL_TABLET | Freq: Three times a day (TID) | ORAL | 0 refills | Status: DC

## 2017-06-04 MED ORDER — TRAMADOL HCL 50 MG PO TABS
50.0000 mg | ORAL_TABLET | Freq: Once | ORAL | Status: AC
  Administered 2017-06-04: 50 mg via ORAL
  Filled 2017-06-04: qty 1

## 2017-06-04 NOTE — ED Triage Notes (Signed)
Pt c/o right leg pain that starts in the knee radiates down to the foot and then back up to the right hip x 1 week. Denies injury. No swelling to right leg. Pain to lateral aspect of right knee with palpation.

## 2017-06-04 NOTE — Discharge Instructions (Addendum)
Your xray shows significant arthritis in your right knee which is probably the source of todays pain.  Use the medicine prescribed as needed - this will make you drowsy. Do not drive within 4 hours of taking this medicine.  Also, I recommend taking daily arthritis strength tylenol 650 mg twice daily.

## 2017-06-05 NOTE — ED Provider Notes (Signed)
[facility] EMERGENCY DEPARTMENT Provider Note   CSN: [id number] Arrival date & time: 06/04/17  1229     History   Chief Complaint Chief Complaint  Patient presents with  . Leg Pain    HPI Erica Graves is a 51 y.o. female with complaint of right knee pain, described as sharp x the past week with radiation to her mid tibia and her mid femur depending on activity, endorsing ambulation, especially going up steps and describes pushing on the gas pedal in her car sometimes triggers discomfort. She denies injury or overuse of the knee. She denies rash, skin changes, distal swelling or weakness.  She has a significant history of osteoarthritis with a left knee complete arthroplasty by Dr. Romeo Apple. She is taking ibuprofen 800 mg without pain relief.    HPI  Past Medical History:  Diagnosis Date  . Carpal tunnel syndrome, bilateral   . Chronic low back pain   . Constipation   . Counseling for estrogen replacement therapy 07/07/2014  . Depression with anxiety   . Dyspareunia 07/07/2014  . History of kidney stones   . Hot flashes 07/07/2014  . Menopause 07/07/2014  . Moody 07/07/2014  . Obesity   . Umbilical hernia     Patient Active Problem List   Diagnosis Date Noted  . S/P trigger finger release 01/24/17 02/06/2017  . Trigger finger of right thumb   . Essential hypertension, benign 11/18/2015  . Gastroesophageal reflux disease without esophagitis 05/06/2015  . Irritable bowel syndrome with constipation 05/06/2015  . Hot flashes 07/07/2014  . Dyspareunia 07/07/2014  . Moody 07/07/2014  . Menopause 07/07/2014  . Counseling for estrogen replacement therapy 07/07/2014  . Morbid obesity (HCC) 03/05/2014  . Carpal tunnel syndrome of right wrist 12/22/2013  . Vitamin D deficiency 10/14/2013  . Migraine headache without aura 11/24/2012  . Muscle contraction headache 11/24/2012  . CTS (carpal tunnel syndrome) 08/14/2012  . Stiffness of joint, not elsewhere classified, lower leg  04/22/2012  . Weakness of left leg 04/22/2012  . Difficulty walking 04/22/2012  . S/P total knee replacement 04/03/2012  . Arthritis of knee, degenerative 03/05/2012  . Osteoarthritis of left knee 12/12/2011  . S/P arthroscopy of left knee 06/13/2011  . Lateral meniscus tear 06/13/2011  . Stiffness of knee joint 06/05/2011  . Medial meniscus, posterior horn derangement 05/16/2011  . DEGENERATIVE JOINT DISEASE, KNEES, BILATERAL 12/19/2009  . 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 FOOT PAIN, LEFT 07/22/2007  . DEPRESSION/ANXIETY 12/27/2006  . SHOULDER PAIN, LEFT 12/27/2006  . OVERWEIGHT 11/12/2006  . CONSTIPATION 11/12/2006  . Lumbago 11/12/2006    Past Surgical History:  Procedure Laterality Date  . ABDOMINAL HYSTERECTOMY    . BACK SURGERY     removal of cyst subdermal  . CARPAL TUNNEL RELEASE Left 09/30/2014   Procedure: CARPAL TUNNEL RELEASE;  Surgeon: Vickki Hearing, MD;  Location: [facility];  Service: Orthopedics;  Laterality: Left;  . CARPAL TUNNEL RELEASE Right 09/28/2015   Procedure: CARPAL TUNNEL RELEASE;  Surgeon: Vickki Hearing, MD;  Location: [facility];  Service: Orthopedics;  Laterality: Right;  . COLONOSCOPY N/A 06/29/2013   Procedure: COLONOSCOPY;  Surgeon: West Bali, MD;  Location: [facility];  Service: Endoscopy;  Laterality: N/A;  1:30  . KNEE ARTHROSCOPY Right   . KNEE ARTHROSCOPY  05/21/2011   Procedure: ARTHROSCOPY KNEE;  Surgeon: Vickki Hearing, MD;  Location: [facility];  Service: Orthopedics;  Laterality: Left;  lateral menisectomy  . KNEE CLOSED REDUCTION Left 05/26/2012  Procedure: CLOSED MANIPULATION KNEE;  Surgeon: Vickki HearingStanley E Harrison, MD;  Location: [facility];  Service: Orthopedics;  Laterality: Left;  . left knee arthroscopy  08/14/2005 Dr. Romeo AppleHarrison torn meniscus  . LUMBAR SPINE SURGERY    . PARTIAL HYSTERECTOMY    . TOTAL KNEE ARTHROPLASTY  03/31/2012   Procedure: TOTAL KNEE ARTHROPLASTY;  Surgeon: Vickki HearingStanley E Harrison, MD;  Location: [facility];  Service:  Orthopedics;  Laterality: Left;  Left Total Knee Arthroplasty  . TRIGGER FINGER RELEASE Right 01/24/2017   Procedure: RELEASE TRIGGER FINGER/A-1 PULLEY right thumb;  Surgeon: Vickki HearingHarrison, Stanley E, MD;  Location: [facility];  Service: Orthopedics;  Laterality: Right;     OB History    Gravida  3   Para  3   Term      Preterm      AB      Living        SAB      TAB      Ectopic      Multiple      Live Births               Home Medications    Prior to Admission medications   Medication Sig Start Date End Date Taking? Authorizing Provider  ALPRAZolam Prudy Feeler(XANAX) 0.5 MG tablet TAKE 1 TABLET BY MOUTH EVERY NIGHT AT BEDTIME AS NEEDED FOR SLEEP 01/07/17   Campbell RichesHoskins, Carolyn C, NP  cholecalciferol (VITAMIN D) 1000 units tablet Take 1,000 Units by mouth daily.    [provider]  ibuprofen (ADVIL,MOTRIN) 800 MG tablet Take 1 tablet (800 mg total) by mouth 3 (three) times daily. 06/04/17   Burgess AmorIdol, Misao Fackrell, PA-C  meloxicam (MOBIC) 7.5 MG tablet One po BID prn pain 04/22/17   Campbell RichesHoskins, Carolyn C, NP  promethazine-dextromethorphan (PROMETHAZINE-DM) 6.25-15 MG/5ML syrup Take 5 mLs by mouth 4 (four) times daily as needed for cough. Patient not taking: Reported on 04/22/2017 03/12/17   Mathews RobinsonsMitchell, Jessica B, PA-C  sodium chloride (OCEAN) 0.65 % SOLN nasal spray Place 1 spray into both nostrils as needed for congestion. Patient not taking: Reported on 04/22/2017 03/12/17   Mathews RobinsonsMitchell, Jessica B, PA-C  traMADol (ULTRAM) 50 MG tablet Take 1 tablet (50 mg total) by mouth every 6 (six) hours as needed. 06/04/17   Burgess AmorIdol, Deakin Lacek, PA-C  traMADol-acetaminophen (ULTRACET) 37.5-325 MG tablet Take 1 tablet by mouth every 6 (six) hours as needed. Patient not taking: Reported on 03/12/2017 01/24/17   Vickki HearingHarrison, Stanley E, MD    Family History Family History  Problem Relation Age of Onset  . Heart disease Mother   . Hypertension Mother   . Colon cancer Neg Hx     Social History Social History   Tobacco Use    . Smoking status: Never Smoker  . Smokeless tobacco: Never Used  Substance Use Topics  . Alcohol use: Yes    Comment: occ  . Drug use: No     Allergies   Norco [hydrocodone-acetaminophen]   Review of Systems Review of Systems  Constitutional: Negative for fever.  Musculoskeletal: Positive for arthralgias. Negative for joint swelling and myalgias.  Neurological: Negative for weakness and numbness.     Physical Exam Updated Vital Signs BP 139/77 (BP Location: Right Arm)   Pulse 65   Temp 98.6 F (37 C) (Oral)   Resp 16   Ht 5\' 5"  (1.651 m)   Wt 90.7 kg (200 lb)   SpO2 100%   BMI 33.28 kg/m   Physical Exam  Constitutional: She appears  well-developed and well-nourished.  HENT:  Head: Atraumatic.  Neck: Normal range of motion.  Cardiovascular:  Pulses equal bilaterally  Musculoskeletal: She exhibits tenderness. She exhibits no edema or deformity.       Right knee: She exhibits no swelling, no effusion, no deformity, no erythema, normal alignment, no LCL laxity, normal patellar mobility, normal meniscus and no MCL laxity. Tenderness found. Medial joint line and lateral joint line tenderness noted. No MCL and no LCL tenderness noted.  Neurological: She is alert. She has normal strength. She displays normal reflexes. No sensory deficit.  Skin: Skin is warm and dry.  Psychiatric: She has a normal mood and affect.     ED Treatments / Results  Labs (all labs ordered are listed, but only abnormal results are displayed) Labs Reviewed - No data to display  EKG None  Radiology Dg Knee Complete 4 Views Right  Result Date: 06/04/2017 CLINICAL DATA:  Posterior knee pain.  No known injury. EXAM: RIGHT KNEE - COMPLETE 4+ VIEW COMPARISON:  03/31/2012. FINDINGS: Tricompartment degenerative change. Degenerative changes most prominent about the medial compartment. Mild lateral subluxation of the tibia noted. No acute bony or joint abnormality identified. IMPRESSION:  Tricompartment degenerative change, most prominent about the medial compartment. There is associated mild lateral tibial subluxation. Electronically Signed   By: Maisie Fus  Register   On: 06/04/2017 15:45    Procedures Procedures (including critical care time)  Medications Ordered in ED Medications  traMADol (ULTRAM) tablet 50 mg (50 mg Oral Given 06/04/17 1532)  ondansetron (ZOFRAN-ODT) disintegrating tablet 4 mg (4 mg Oral Given 06/04/17 1642)     Initial Impression / Assessment and Plan / ED Course  I have reviewed the triage vital signs and the nursing notes.  Pertinent labs & imaging results that were available during my care of the patient were reviewed by me and considered in my medical decision making (see chart for details).     Imaging reviewed and discussed with patient. Knee sleeve provided for discomfort. Discussed heat tx and the role of ibu and tylenol combined which can give improved arthritis relief.  Small quantity of tramadol prescribed for additional pain relief. She was given one here with development of nausea, zofran added.  Plan f/u with Dr. Romeo Apple and/or pcp prn.  Final Clinical Impressions(s) / ED Diagnoses   Final diagnoses:  Arthritis of right knee    ED Discharge Orders        Ordered    traMADol (ULTRAM) 50 MG tablet  Every 6 hours PRN     06/04/17 1558    ibuprofen (ADVIL,MOTRIN) 800 MG tablet  3 times daily     06/04/17 1601       Burgess Amor, PA-C 06/05/17 1507    Derwood Kaplan, MD 06/06/17 1540

## 2017-06-12 ENCOUNTER — Ambulatory Visit: Payer: BC Managed Care – PPO | Admitting: Orthopedic Surgery

## 2017-06-12 ENCOUNTER — Encounter: Payer: Self-pay | Admitting: Orthopedic Surgery

## 2017-06-12 VITALS — BP 147/91 | HR 80 | Ht 65.0 in | Wt 203.0 lb

## 2017-06-12 DIAGNOSIS — M7052 Other bursitis of knee, left knee: Secondary | ICD-10-CM | POA: Diagnosis not present

## 2017-06-12 NOTE — Progress Notes (Signed)
Progress Note   Patient ID: Erica HurdleJanice M Rabine, female   DOB: 06/27/1965, 52 y.o.   MRN: [id number]  Chief Complaint  Patient presents with  . Knee Pain    right for several months, worse in  past week     52 year old female presents for evaluation of her right knee.  A few days ago she had gradual onset 3 days right medial knee pain center to the ER for severe pain.  Pain was along the medial hamstrings associated with swelling decreased range of motion  She took some ibuprofen and rested for a few days and then things seem to get better    Review of Systems  Constitutional: Negative for fever.  Skin: Negative.    Current Meds  Medication Sig  . ALPRAZolam (XANAX) 0.5 MG tablet TAKE 1 TABLET BY MOUTH EVERY NIGHT AT BEDTIME AS NEEDED FOR SLEEP  . cholecalciferol (VITAMIN D) 1000 units tablet Take 1,000 Units by mouth daily.    Past Medical History:  Diagnosis Date  . Carpal tunnel syndrome, bilateral   . Chronic low back pain   . Constipation   . Counseling for estrogen replacement therapy 07/07/2014  . Depression with anxiety   . Dyspareunia 07/07/2014  . History of kidney stones   . Hot flashes 07/07/2014  . Menopause 07/07/2014  . Moody 07/07/2014  . Obesity   . Umbilical hernia      Allergies  Allergen Reactions  . Norco [Hydrocodone-Acetaminophen] Nausea And Vomiting    BP (!) 147/91   Pulse 80   Ht 5\' 5"  (1.651 m)   Wt 203 lb (92.1 kg)   BMI 33.78 kg/m    Physical Exam  Constitutional: She is oriented to person, place, and time. She appears well-developed and well-nourished.  Musculoskeletal:       Right knee: She exhibits no effusion.  Neurological: She is alert and oriented to person, place, and time.  Psychiatric: She has a normal mood and affect. Judgment normal.  Vitals reviewed.   Right Knee Exam   Muscle Strength  The patient has normal right knee strength.  Tenderness  The patient is experiencing tenderness in the medial hamstring.  Range  of Motion  Extension: normal  Flexion: normal   Other  Erythema: absent Sensation: normal Pulse: present Swelling: none Effusion: no effusion present       Medical decision-making  Imaging:  [facility] will read.  3 views right knee    Encounter Diagnosis  Name Primary?  . Pes anserinus bursitis of left knee Yes   Recommend ice ibuprofen rest when she gets an attack right now she is ready good shows chronic arthritis with joint effusion      Fuller CanadaStanley Khalidah Herbold, MD 06/12/2017 3:57 PM

## 2017-07-02 ENCOUNTER — Encounter: Payer: Self-pay | Admitting: Family Medicine

## 2017-07-02 ENCOUNTER — Ambulatory Visit: Payer: BC Managed Care – PPO | Admitting: Family Medicine

## 2017-07-02 VITALS — BP 136/96 | Ht 65.0 in | Wt 203.2 lb

## 2017-07-02 DIAGNOSIS — I10 Essential (primary) hypertension: Secondary | ICD-10-CM | POA: Diagnosis not present

## 2017-07-02 MED ORDER — AMLODIPINE BESYLATE 2.5 MG PO TABS: 2.5000 mg | ORAL_TABLET | Freq: Every day | ORAL | 3 refills | Status: DC

## 2017-07-02 NOTE — Progress Notes (Signed)
   Subjective:    Patient ID: Erica Graves, female    DOB: Feb 03, 1966, 52 y.o.   MRN: [id number]  HPI Patietn arrives with  Elevated blood pressure reading-currently not on any medication for her blood pressure. Patient with recent visit for DOT physical blood pressure was elevated she has had this problem for she was on medicine but then was not having to be on any further medicine then patient relates recently blood pressure has been elevated she denies any chest tightness pressure pain shortness of breath denies any edema in her legs she does try to watch how she eats she is unable to exercise because she takes care of her mother and works full-time she is a school bus driver in the afternoons    Review of Systems  Constitutional: Negative for activity change, fatigue and fever.  HENT: Negative for congestion and rhinorrhea.   Respiratory: Negative for cough, chest tightness and shortness of breath.   Cardiovascular: Negative for chest pain and leg swelling.  Gastrointestinal: Negative for abdominal pain, constipation and diarrhea.  Skin: Negative for color change.  Neurological: Negative for numbness and headaches.  Psychiatric/Behavioral: Negative for behavioral problems and confusion.       Objective:   Physical Exam  Constitutional: She appears well-developed and well-nourished. No distress.  HENT:  Head: Normocephalic and atraumatic.  Eyes: Right eye exhibits no discharge. Left eye exhibits no discharge.  Neck: No tracheal deviation present.  Cardiovascular: Normal rate, regular rhythm and normal heart sounds.  No murmur heard. Pulmonary/Chest: Effort normal and breath sounds normal. No respiratory distress. She has no wheezes. She has no rales.  Musculoskeletal: She exhibits no edema.  Lymphadenopathy:    She has no cervical adenopathy.  Neurological: She is alert. She exhibits normal muscle tone.  Skin: Skin is warm and dry. No erythema.  Psychiatric: Her behavior is  normal.  Vitals reviewed.         Assessment & Plan:  Blood pressure elevated Recommend low-dose amlodipine 2.5 mg daily Watch diet closely Minimize fall Try to lose weight Exercise regularly follow-up in 6 weeks to recheck sooner if any problems

## 2017-07-02 NOTE — Patient Instructions (Signed)
DASH Eating Plan DASH stands for "Dietary Approaches to Stop Hypertension." The DASH eating plan is a healthy eating plan that has been shown to reduce high blood pressure (hypertension). It may also reduce your risk for type 2 diabetes, heart disease, and stroke. The DASH eating plan may also help with weight loss. What are tips for following this plan? General guidelines  Avoid eating more than 2,300 mg (milligrams) of salt (sodium) a day. If you have hypertension, you may need to reduce your sodium intake to 1,500 mg a day.  Limit alcohol intake to no more than 1 drink a day for nonpregnant women and 2 drinks a day for men. One drink equals 12 oz of beer, 5 oz of wine, or 1 oz of hard liquor.  Work with your health care provider to maintain a healthy body weight or to lose weight. Ask what an ideal weight is for you.  Get at least 30 minutes of exercise that causes your heart to beat faster (aerobic exercise) most days of the week. Activities may include walking, swimming, or biking.  Work with your health care provider or diet and nutrition specialist (dietitian) to adjust your eating plan to your individual calorie needs. Reading food labels  Check food labels for the amount of sodium per serving. Choose foods with less than 5 percent of the Daily Value of sodium. Generally, foods with less than 300 mg of sodium per serving fit into this eating plan.  To find whole grains, look for the word "whole" as the first word in the ingredient list. Shopping  Buy products labeled as "low-sodium" or "no salt added."  Buy fresh foods. Avoid canned foods and premade or frozen meals. Cooking  Avoid adding salt when cooking. Use salt-free seasonings or herbs instead of table salt or sea salt. Check with your health care provider or pharmacist before using salt substitutes.  Do not fry foods. Cook foods using healthy methods such as baking, boiling, grilling, and broiling instead.  Cook with  heart-healthy oils, such as olive, canola, soybean, or sunflower oil. Meal planning   Eat a balanced diet that includes: ? 5 or more servings of fruits and vegetables each day. At each meal, try to fill half of your plate with fruits and vegetables. ? Up to 6-8 servings of whole grains each day. ? Less than 6 oz of lean meat, poultry, or fish each day. A 3-oz serving of meat is about the same size as a deck of cards. One egg equals 1 oz. ? 2 servings of low-fat dairy each day. ? A serving of nuts, seeds, or beans 5 times each week. ? Heart-healthy fats. Healthy fats called Omega-3 fatty acids are found in foods such as flaxseeds and coldwater fish, like sardines, salmon, and mackerel.  Limit how much you eat of the following: ? Canned or prepackaged foods. ? Food that is high in trans fat, such as fried foods. ? Food that is high in saturated fat, such as fatty meat. ? Sweets, desserts, sugary drinks, and other foods with added sugar. ? Full-fat dairy products.  Do not salt foods before eating.  Try to eat at least 2 vegetarian meals each week.  Eat more home-cooked food and less restaurant, buffet, and fast food.  When eating at a restaurant, ask that your food be prepared with less salt or no salt, if possible. What foods are recommended? The items listed may not be a complete list. Talk with your dietitian about what   dietary choices are best for you. Grains Whole-grain or whole-wheat bread. Whole-grain or whole-wheat pasta. Brown rice. Oatmeal. Quinoa. Bulgur. Whole-grain and low-sodium cereals. Pita bread. Low-fat, low-sodium crackers. Whole-wheat flour tortillas. Vegetables Fresh or frozen vegetables (raw, steamed, roasted, or grilled). Low-sodium or reduced-sodium tomato and vegetable juice. Low-sodium or reduced-sodium tomato sauce and tomato paste. Low-sodium or reduced-sodium canned vegetables. Fruits All fresh, dried, or frozen fruit. Canned fruit in natural juice (without  added sugar). Meat and other protein foods Skinless chicken or turkey. Ground chicken or turkey. Pork with fat trimmed off. Fish and seafood. Egg whites. Dried beans, peas, or lentils. Unsalted nuts, nut butters, and seeds. Unsalted canned beans. Lean cuts of beef with fat trimmed off. Low-sodium, lean deli meat. Dairy Low-fat (1%) or fat-free (skim) milk. Fat-free, low-fat, or reduced-fat cheeses. Nonfat, low-sodium ricotta or cottage cheese. Low-fat or nonfat yogurt. Low-fat, low-sodium cheese. Fats and oils Soft margarine without trans fats. Vegetable oil. Low-fat, reduced-fat, or light mayonnaise and salad dressings (reduced-sodium). Canola, safflower, olive, soybean, and sunflower oils. Avocado. Seasoning and other foods Herbs. Spices. Seasoning mixes without salt. Unsalted popcorn and pretzels. Fat-free sweets. What foods are not recommended? The items listed may not be a complete list. Talk with your dietitian about what dietary choices are best for you. Grains Baked goods made with fat, such as croissants, muffins, or some breads. Dry pasta or rice meal packs. Vegetables Creamed or fried vegetables. Vegetables in a cheese sauce. Regular canned vegetables (not low-sodium or reduced-sodium). Regular canned tomato sauce and paste (not low-sodium or reduced-sodium). Regular tomato and vegetable juice (not low-sodium or reduced-sodium). Pickles. Olives. Fruits Canned fruit in a light or heavy syrup. Fried fruit. Fruit in cream or butter sauce. Meat and other protein foods Fatty cuts of meat. Ribs. Fried meat. Bacon. Sausage. Bologna and other processed lunch meats. Salami. Fatback. Hotdogs. Bratwurst. Salted nuts and seeds. Canned beans with added salt. Canned or smoked fish. Whole eggs or egg yolks. Chicken or turkey with skin. Dairy Whole or 2% milk, cream, and half-and-half. Whole or full-fat cream cheese. Whole-fat or sweetened yogurt. Full-fat cheese. Nondairy creamers. Whipped toppings.  Processed cheese and cheese spreads. Fats and oils Butter. Stick margarine. Lard. Shortening. Ghee. Bacon fat. Tropical oils, such as coconut, palm kernel, or palm oil. Seasoning and other foods Salted popcorn and pretzels. Onion salt, garlic salt, seasoned salt, table salt, and sea salt. Worcestershire sauce. Tartar sauce. Barbecue sauce. Teriyaki sauce. Soy sauce, including reduced-sodium. Steak sauce. Canned and packaged gravies. Fish sauce. Oyster sauce. Cocktail sauce. Horseradish that you find on the shelf. Ketchup. Mustard. Meat flavorings and tenderizers. Bouillon cubes. Hot sauce and Tabasco sauce. Premade or packaged marinades. Premade or packaged taco seasonings. Relishes. Regular salad dressings. Where to find more information:  National Heart, Lung, and Blood Institute: www.nhlbi.nih.gov  American Heart Association: www.heart.org Summary  The DASH eating plan is a healthy eating plan that has been shown to reduce high blood pressure (hypertension). It may also reduce your risk for type 2 diabetes, heart disease, and stroke.  With the DASH eating plan, you should limit salt (sodium) intake to 2,300 mg a day. If you have hypertension, you may need to reduce your sodium intake to 1,500 mg a day.  When on the DASH eating plan, aim to eat more fresh fruits and vegetables, whole grains, lean proteins, low-fat dairy, and heart-healthy fats.  Work with your health care provider or diet and nutrition specialist (dietitian) to adjust your eating plan to your individual   calorie needs. This information is not intended to replace advice given to you by your health care provider. Make sure you discuss any questions you have with your health care provider. Document Released: 02/01/2011 Document Revised: 02/06/2016 Document Reviewed: 02/06/2016 Elsevier Interactive Patient Education  2018 Elsevier Inc.  

## 2017-07-10 ENCOUNTER — Encounter: Payer: BC Managed Care – PPO | Admitting: Nurse Practitioner

## 2017-07-23 ENCOUNTER — Encounter: Payer: Self-pay | Admitting: Family Medicine

## 2017-07-23 ENCOUNTER — Ambulatory Visit: Payer: BC Managed Care – PPO | Admitting: Family Medicine

## 2017-07-23 VITALS — BP 144/90 | Ht 65.0 in | Wt 203.4 lb

## 2017-07-23 DIAGNOSIS — I10 Essential (primary) hypertension: Secondary | ICD-10-CM | POA: Diagnosis not present

## 2017-07-23 MED ORDER — AMLODIPINE BESYLATE 5 MG PO TABS: 5.0000 mg | ORAL_TABLET | Freq: Every day | ORAL | 5 refills | Status: DC

## 2017-07-23 NOTE — Progress Notes (Signed)
   Subjective:    Patient ID: Erica Graves, female    DOB: 01-Sep-1965, 52 y.o.   MRN: [id number]  Hypertension  This is a new problem. Pertinent negatives include no chest pain. Treatments tried: started on norvasc 3 weeks ago.  She is tolerating the low-dose she p she relates that blood pressure seems to be fair but not as good as she would hope for she is trying to watch diet is trying to do some exercise and activity t states no concerns today.   Review of Systems  Constitutional: Negative for activity change, appetite change and fatigue.  HENT: Negative for congestion.   Respiratory: Negative for cough.   Cardiovascular: Negative for chest pain.  Gastrointestinal: Negative for abdominal pain.  Endocrine: Negative for polydipsia and polyphagia.  Skin: Negative for color change.  Neurological: Negative for weakness.  Psychiatric/Behavioral: Negative for confusion.       Objective:   Physical Exam  Constitutional: She appears well-nourished. No distress.  Cardiovascular: Normal rate, regular rhythm and normal heart sounds.  No murmur heard. Pulmonary/Chest: Effort normal and breath sounds normal. No respiratory distress.  Musculoskeletal: She exhibits no edema.  Lymphadenopathy:    She has no cervical adenopathy.  Neurological: She is alert. She exhibits normal muscle tone.  Psychiatric: Her behavior is normal.  Vitals reviewed.         Assessment & Plan:  HTN- low-salt diet Regular physical activity Increased dose of the medication amlodipine 5 mg daily Follow-up in the fall Check blood pressure outside the office send [place] readings somewhere in the next few weeks Follow-up sooner if any problems

## 2017-07-23 NOTE — Patient Instructions (Signed)
DASH Eating Plan DASH stands for "Dietary Approaches to Stop Hypertension." The DASH eating plan is a healthy eating plan that has been shown to reduce high blood pressure (hypertension). It may also reduce your risk for type 2 diabetes, heart disease, and stroke. The DASH eating plan may also help with weight loss. What are tips for following this plan? General guidelines  Avoid eating more than 2,300 mg (milligrams) of salt (sodium) a day. If you have hypertension, you may need to reduce your sodium intake to 1,500 mg a day.  Limit alcohol intake to no more than 1 drink a day for nonpregnant women and 2 drinks a day for men. One drink equals 12 oz of beer, 5 oz of wine, or 1 oz of hard liquor.  Work with your health care provider to maintain a healthy body weight or to lose weight. Ask what an ideal weight is for you.  Get at least 30 minutes of exercise that causes your heart to beat faster (aerobic exercise) most days of the week. Activities may include walking, swimming, or biking.  Work with your health care provider or diet and nutrition specialist (dietitian) to adjust your eating plan to your individual calorie needs. Reading food labels  Check food labels for the amount of sodium per serving. Choose foods with less than 5 percent of the Daily Value of sodium. Generally, foods with less than 300 mg of sodium per serving fit into this eating plan.  To find whole grains, look for the word "whole" as the first word in the ingredient list. Shopping  Buy products labeled as "low-sodium" or "no salt added."  Buy fresh foods. Avoid canned foods and premade or frozen meals. Cooking  Avoid adding salt when cooking. Use salt-free seasonings or herbs instead of table salt or sea salt. Check with your health care provider or pharmacist before using salt substitutes.  Do not fry foods. Cook foods using healthy methods such as baking, boiling, grilling, and broiling instead.  Cook with  heart-healthy oils, such as olive, canola, soybean, or sunflower oil. Meal planning   Eat a balanced diet that includes: ? 5 or more servings of fruits and vegetables each day. At each meal, try to fill half of your plate with fruits and vegetables. ? Up to 6-8 servings of whole grains each day. ? Less than 6 oz of lean meat, poultry, or fish each day. A 3-oz serving of meat is about the same size as a deck of cards. One egg equals 1 oz. ? 2 servings of low-fat dairy each day. ? A serving of nuts, seeds, or beans 5 times each week. ? Heart-healthy fats. Healthy fats called Omega-3 fatty acids are found in foods such as flaxseeds and coldwater fish, like sardines, salmon, and mackerel.  Limit how much you eat of the following: ? Canned or prepackaged foods. ? Food that is high in trans fat, such as fried foods. ? Food that is high in saturated fat, such as fatty meat. ? Sweets, desserts, sugary drinks, and other foods with added sugar. ? Full-fat dairy products.  Do not salt foods before eating.  Try to eat at least 2 vegetarian meals each week.  Eat more home-cooked food and less restaurant, buffet, and fast food.  When eating at a restaurant, ask that your food be prepared with less salt or no salt, if possible. What foods are recommended? The items listed may not be a complete list. Talk with your dietitian about what   dietary choices are best for you. Grains Whole-grain or whole-wheat bread. Whole-grain or whole-wheat pasta. Brown rice. Oatmeal. Quinoa. Bulgur. Whole-grain and low-sodium cereals. Pita bread. Low-fat, low-sodium crackers. Whole-wheat flour tortillas. Vegetables Fresh or frozen vegetables (raw, steamed, roasted, or grilled). Low-sodium or reduced-sodium tomato and vegetable juice. Low-sodium or reduced-sodium tomato sauce and tomato paste. Low-sodium or reduced-sodium canned vegetables. Fruits All fresh, dried, or frozen fruit. Canned fruit in natural juice (without  added sugar). Meat and other protein foods Skinless chicken or turkey. Ground chicken or turkey. Pork with fat trimmed off. Fish and seafood. Egg whites. Dried beans, peas, or lentils. Unsalted nuts, nut butters, and seeds. Unsalted canned beans. Lean cuts of beef with fat trimmed off. Low-sodium, lean deli meat. Dairy Low-fat (1%) or fat-free (skim) milk. Fat-free, low-fat, or reduced-fat cheeses. Nonfat, low-sodium ricotta or cottage cheese. Low-fat or nonfat yogurt. Low-fat, low-sodium cheese. Fats and oils Soft margarine without trans fats. Vegetable oil. Low-fat, reduced-fat, or light mayonnaise and salad dressings (reduced-sodium). Canola, safflower, olive, soybean, and sunflower oils. Avocado. Seasoning and other foods Herbs. Spices. Seasoning mixes without salt. Unsalted popcorn and pretzels. Fat-free sweets. What foods are not recommended? The items listed may not be a complete list. Talk with your dietitian about what dietary choices are best for you. Grains Baked goods made with fat, such as croissants, muffins, or some breads. Dry pasta or rice meal packs. Vegetables Creamed or fried vegetables. Vegetables in a cheese sauce. Regular canned vegetables (not low-sodium or reduced-sodium). Regular canned tomato sauce and paste (not low-sodium or reduced-sodium). Regular tomato and vegetable juice (not low-sodium or reduced-sodium). Pickles. Olives. Fruits Canned fruit in a light or heavy syrup. Fried fruit. Fruit in cream or butter sauce. Meat and other protein foods Fatty cuts of meat. Ribs. Fried meat. Bacon. Sausage. Bologna and other processed lunch meats. Salami. Fatback. Hotdogs. Bratwurst. Salted nuts and seeds. Canned beans with added salt. Canned or smoked fish. Whole eggs or egg yolks. Chicken or turkey with skin. Dairy Whole or 2% milk, cream, and half-and-half. Whole or full-fat cream cheese. Whole-fat or sweetened yogurt. Full-fat cheese. Nondairy creamers. Whipped toppings.  Processed cheese and cheese spreads. Fats and oils Butter. Stick margarine. Lard. Shortening. Ghee. Bacon fat. Tropical oils, such as coconut, palm kernel, or palm oil. Seasoning and other foods Salted popcorn and pretzels. Onion salt, garlic salt, seasoned salt, table salt, and sea salt. Worcestershire sauce. Tartar sauce. Barbecue sauce. Teriyaki sauce. Soy sauce, including reduced-sodium. Steak sauce. Canned and packaged gravies. Fish sauce. Oyster sauce. Cocktail sauce. Horseradish that you find on the shelf. Ketchup. Mustard. Meat flavorings and tenderizers. Bouillon cubes. Hot sauce and Tabasco sauce. Premade or packaged marinades. Premade or packaged taco seasonings. Relishes. Regular salad dressings. Where to find more information:  National Heart, Lung, and Blood Institute: www.nhlbi.nih.gov  American Heart Association: www.heart.org Summary  The DASH eating plan is a healthy eating plan that has been shown to reduce high blood pressure (hypertension). It may also reduce your risk for type 2 diabetes, heart disease, and stroke.  With the DASH eating plan, you should limit salt (sodium) intake to 2,300 mg a day. If you have hypertension, you may need to reduce your sodium intake to 1,500 mg a day.  When on the DASH eating plan, aim to eat more fresh fruits and vegetables, whole grains, lean proteins, low-fat dairy, and heart-healthy fats.  Work with your health care provider or diet and nutrition specialist (dietitian) to adjust your eating plan to your individual   calorie needs. This information is not intended to replace advice given to you by your health care provider. Make sure you discuss any questions you have with your health care provider. Document Released: 02/01/2011 Document Revised: 02/06/2016 Document Reviewed: 02/06/2016 Elsevier Interactive Patient Education  2018 Elsevier Inc.  

## 2017-07-29 ENCOUNTER — Other Ambulatory Visit: Payer: Self-pay | Admitting: Nurse Practitioner

## 2017-08-06 ENCOUNTER — Ambulatory Visit: Payer: Self-pay | Admitting: Orthopedic Surgery

## 2017-10-07 ENCOUNTER — Encounter: Payer: Self-pay | Admitting: Orthopedic Surgery

## 2017-10-07 ENCOUNTER — Ambulatory Visit: Payer: BC Managed Care – PPO | Admitting: Orthopedic Surgery

## 2017-10-07 ENCOUNTER — Ambulatory Visit (INDEPENDENT_AMBULATORY_CARE_PROVIDER_SITE_OTHER): Payer: BC Managed Care – PPO

## 2017-10-07 VITALS — BP 140/86 | HR 64 | Ht 65.0 in | Wt 194.0 lb

## 2017-10-07 DIAGNOSIS — Z96652 Presence of left artificial knee joint: Secondary | ICD-10-CM

## 2017-10-07 DIAGNOSIS — M76822 Posterior tibial tendinitis, left leg: Secondary | ICD-10-CM

## 2017-10-07 NOTE — Progress Notes (Signed)
Chief Complaint  Patient presents with  . Routine Post Op    Recheck on left knee replacement, DOS 03-31-12.   52 year old female status post left total knee this is yr #5.  She has no complaints x-ray today looks good.  Review of Systems  Constitutional: Negative for chills and fever.  Musculoskeletal:       She complains of plantar heel pain treated by podiatry for the last 2 months with no improvement after injections.  She complains of pain on the medial side of the posterior medial malleolus as well as the plantar aspect of the left heel.  The pain again is been present for 2 to 3 months its a dull aching pain running up the back of the tibia moderate in severity associated with some pain with tiptoe standing  Neurological: Negative for tremors.   Past Medical History:  Diagnosis Date  . Carpal tunnel syndrome, bilateral   . Chronic low back pain   . Constipation   . Counseling for estrogen replacement therapy 07/07/2014  . Depression with anxiety   . Dyspareunia 07/07/2014  . History of kidney stones   . Hot flashes 07/07/2014  . Menopause 07/07/2014  . Moody 07/07/2014  . Obesity   . Umbilical hernia    Physical Exam  Constitutional: She is oriented to person, place, and time. She appears well-developed and well-nourished.  Neurological: She is alert and oriented to person, place, and time.  Psychiatric: She has a normal mood and affect. Judgment normal.  Vitals reviewed.  Left knee normal incision normal alignment no tenderness, range of motion 120 degrees stability tests in extension flexion were normal strength and muscle tone excellent skin incision clean dry and intact.  Pulse and perfusion normal sensation normal left leg  Left foot and right foot in standing position mild pes planus is flexible subtalar joint is flexible on the left she has tenderness in the heel as well as the posterior tibial tendon as a positive tiptoe weakness test for weakness of the posterior tibial  tendon ankle joint is stable  X-ray of the left knee shows overall normal alignment implant is in acceptable position with no loosening after 5 years  Recommend Spenco orthotics for 6 weeks and then follow-up for further testing or imaging if needed  Continue with 1 year follow-up x-rays regarding total knee replacement

## 2017-11-20 ENCOUNTER — Encounter: Payer: Self-pay | Admitting: Orthopedic Surgery

## 2017-11-20 ENCOUNTER — Ambulatory Visit: Payer: BC Managed Care – PPO | Admitting: Orthopedic Surgery

## 2017-11-20 VITALS — BP 166/89 | HR 62 | Ht 65.0 in | Wt 199.0 lb

## 2017-11-20 DIAGNOSIS — M7662 Achilles tendinitis, left leg: Secondary | ICD-10-CM

## 2017-11-20 DIAGNOSIS — M76822 Posterior tibial tendinitis, left leg: Secondary | ICD-10-CM

## 2017-11-20 MED ORDER — NAPROXEN 500 MG PO TABS: 500.0000 mg | ORAL_TABLET | Freq: Two times a day (BID) | ORAL | 2 refills | Status: DC

## 2017-11-20 NOTE — Progress Notes (Signed)
Chief Complaint  Patient presents with  . Foot Pain    left has pain at night, at Achilles area   52 years old treating for posterior tibial tendinitis presents with new onset Achilles tendon pain without trauma pain is located in the posterior aspect of the left heel present for couple weeks dull aching pain moderate to severe associated with painful weightbearing decreased range of motion and painful dorsiflexion of the foot  Review of systems no numbness or tingling no fever weight loss excessive weight gain has been noted  Past Medical History:  Diagnosis Date  . Carpal tunnel syndrome, bilateral   . Chronic low back pain   . Constipation   . Counseling for estrogen replacement therapy 07/07/2014  . Depression with anxiety   . Dyspareunia 07/07/2014  . History of kidney stones   . Hot flashes 07/07/2014  . Menopause 07/07/2014  . Moody 07/07/2014  . Obesity   . Umbilical hernia    BP (!) 166/89   Pulse 62   Ht 5\' 5"  (1.651 m)   Wt 199 lb (90.3 kg)   BMI 33.12 kg/m   She is awake alert and oriented x3 mood and affect are normal she ambulates with a slight antalgic gait favoring the left lower extremity  Grooming hygiene normal without deformity  Tenderness and swelling in the Achilles tendon without nodularity painful dorsiflexion plantarflexion of the foot without weakness ankle is stable skin is warm dry and intact without erythema pulse and temperature are normal she has no sensory abnormalities  Encounter Diagnoses  Name Primary?  . Posterior tibial tendon dysfunction (PTTD) of left lower extremity   . Achilles tendinitis, left leg Yes    Commend CAM Walker and anti-inflammatories for 6 weeks and follow-up for reevaluation  Meds ordered this encounter  Medications  . naproxen (NAPROSYN) 500 MG tablet    Sig: Take 1 tablet (500 mg total) by mouth 2 (two) times daily with a meal.    Dispense:  60 tablet    Refill:  2

## 2017-11-25 ENCOUNTER — Encounter: Payer: Self-pay | Admitting: Orthopedic Surgery

## 2017-12-12 ENCOUNTER — Encounter: Payer: Self-pay | Admitting: Family Medicine

## 2017-12-12 ENCOUNTER — Ambulatory Visit: Payer: BC Managed Care – PPO | Admitting: Family Medicine

## 2017-12-12 VITALS — BP 130/96 | Ht 65.0 in | Wt 204.6 lb

## 2017-12-12 DIAGNOSIS — R519 Headache, unspecified: Secondary | ICD-10-CM

## 2017-12-12 DIAGNOSIS — I10 Essential (primary) hypertension: Secondary | ICD-10-CM

## 2017-12-12 DIAGNOSIS — R51 Headache: Secondary | ICD-10-CM | POA: Diagnosis not present

## 2017-12-12 MED ORDER — POTASSIUM CHLORIDE ER 10 MEQ PO TBCR: 10.0000 meq | EXTENDED_RELEASE_TABLET | Freq: Every day | ORAL | 2 refills | Status: DC

## 2017-12-12 MED ORDER — INDAPAMIDE 1.25 MG PO TABS: 1.2500 mg | ORAL_TABLET | Freq: Every day | ORAL | 2 refills | Status: DC

## 2017-12-12 NOTE — Progress Notes (Signed)
Subjective:    Patient ID: Erica Graves, female    DOB: [birth year], 52 y.o.   MRN: [id number]  Hypertension  This is a chronic problem. Associated symptoms include headaches and malaise/fatigue. Pertinent negatives include no chest pain or shortness of breath. Treatments tried: norvasc 5 mg. Compliance problems include exercise.    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   Amlodipine was increased to 5 mg back in May. Does not have home BP cuff to monitor.  Reports mid-frontal headache and occipital h/a, described as throbbing, intermittently some days over the last two months. Reports h/a worse than usual today and had BP checked at work 162/98. Reports dizziness described as unsteady and malaise with h/a. Normally doesn't take anything for her h/a, today took a goody powder with no relief. Reports has woken up with h/a pain at night. Reports compliance with medication, has been doing low-salt diet.   Review of Systems  Constitutional: Positive for fatigue and malaise/fatigue.  Eyes: Negative for photophobia and visual disturbance.  Respiratory: Negative for shortness of breath.   Cardiovascular: Negative for chest pain.  Gastrointestinal: Negative for nausea and vomiting.  Neurological: Positive for dizziness and headaches. Negative for weakness and numbness.        Objective:   Physical Exam  Constitutional: She is oriented to person, place, and time. She appears well-developed and well-nourished. No distress.  HENT:  Head: Normocephalic and atraumatic.  Tender to palpation over right temple  Eyes: Pupils are equal, round, and reactive to light. EOM are normal. Right eye exhibits no discharge. Left eye exhibits no discharge.  Neck: Neck supple.  Cardiovascular: Normal rate, regular rhythm and normal heart sounds.  No murmur heard. Pulmonary/Chest: Effort normal and breath sounds normal. No respiratory distress. She has no wheezes.  Neurological: She is  alert and oriented to person, place, and time. She displays a negative Romberg sign. Coordination normal.  Skin: Skin is warm and dry.  Psychiatric: She has a normal mood and affect.  Nursing note and vitals reviewed.     Assessment & Plan:  1. Essential hypertension Patient with elevated blood pressure readings in the office today.  Reports compliance with current medication.  States she limit salt in her diet, unable to get significant amount of exercise due to wearing a boot for her Achilles tendon.  Will start patient on a low dose of Lozol daily with the addition of a potassium supplement.  Recommend she keep track of her blood pressures and bring that information with her to her next appointment.  Will get a MET 7 prior to next appointment.  2. Acute nonintractable headache, unspecified headache type Headaches are most likely related to the elevated blood pressure.  Recommended Tylenol as needed for headache pain.  Avoid Goody powders.  Patient does not drink an excessive amount of caffeine.  Recommend she keep track of her headaches with a headache diary.  She will follow-up with [place] in the next 3 to 4 weeks to reevaluate her headaches and high blood pressure at that time.  She will notify us sooner than that if her symptoms worsen.  If at her next visit her headaches are persisting despite good blood pressure control will need further evaluation at that time.  - indapamide (LOZOL) 1.25 MG tablet; Take 1 tablet (1.25 mg total) by mouth daily. - potassium chloride (K-DUR) 10 MEQ tablet; Take 1 tablet (10 mEq total) by mouth daily.  Dr. Nicki Reaper was consulted on this case,  he examined the patient, and is in agreement with the above treatment plan.

## 2017-12-18 ENCOUNTER — Ambulatory Visit: Payer: BC Managed Care – PPO | Admitting: Family Medicine

## 2018-01-01 ENCOUNTER — Encounter: Payer: Self-pay | Admitting: Orthopedic Surgery

## 2018-01-01 ENCOUNTER — Ambulatory Visit: Payer: BC Managed Care – PPO | Admitting: Orthopedic Surgery

## 2018-01-01 VITALS — BP 141/83 | HR 72 | Ht 65.0 in | Wt 201.0 lb

## 2018-01-01 DIAGNOSIS — M7662 Achilles tendinitis, left leg: Secondary | ICD-10-CM

## 2018-01-01 MED ORDER — PREDNISONE 10 MG PO TABS: 10.0000 mg | ORAL_TABLET | Freq: Two times a day (BID) | ORAL | 1 refills | Status: DC

## 2018-01-01 NOTE — Patient Instructions (Signed)
Note for work please allow patient to sit as much as possible at work  Start physical therapy  Prednisone 10 mg twice a day for 10 days  Do not take naproxen during the time you are taking prednisone  Continue walking boot  Follow-up 7 weeks

## 2018-01-01 NOTE — Addendum Note (Signed)
Addended byCaffie Damme on: 01/01/2018 03:34 PM   Modules accepted: Orders

## 2018-01-01 NOTE — Progress Notes (Signed)
Chief Complaint  Patient presents with  . Foot Pain    left achilles     52 year old female with pain in her Achilles we put her in a boot put her on naproxen for 6 weeks no improvement  Review of systems no catching locking giving way no fever chills or skin rash  Exam BP (!) 141/83   Pulse 72   Ht 5\' 5"  (1.651 m)   Wt 201 lb (91.2 kg)   BMI 33.45 kg/m   well-developed well-nourished female grooming hygiene intact tenderness and swelling Achilles tendon full range of motion ankle stable neurovascular exam intact  Encounter Diagnosis  Name Primary?  . Achilles tendinitis, left leg Yes   Meds ordered this encounter  Medications  . predniSONE (DELTASONE) 10 MG tablet    Sig: Take 1 tablet (10 mg total) by mouth 2 (two) times daily with a meal.    Dispense:  20 tablet    Refill:  1    Note for work please allow patient to sit as much as possible at work  Start physical therapy  Prednisone 10 mg twice a day for 10 days  Do not take naproxen during the time you are taking prednisone  Continue walking boot  Follow-up 7 weeks

## 2018-01-14 ENCOUNTER — Ambulatory Visit: Payer: BC Managed Care – PPO | Admitting: Family Medicine

## 2018-01-14 ENCOUNTER — Encounter: Payer: Self-pay | Admitting: Family Medicine

## 2018-01-14 VITALS — BP 132/82 | Ht 65.0 in | Wt 204.4 lb

## 2018-01-14 DIAGNOSIS — I10 Essential (primary) hypertension: Secondary | ICD-10-CM

## 2018-01-14 DIAGNOSIS — Z23 Encounter for immunization: Secondary | ICD-10-CM | POA: Diagnosis not present

## 2018-01-14 MED ORDER — POTASSIUM CHLORIDE ER 10 MEQ PO TBCR: 10.0000 meq | EXTENDED_RELEASE_TABLET | Freq: Every day | ORAL | 5 refills | Status: DC

## 2018-01-14 MED ORDER — ALPRAZOLAM 0.5 MG PO TABS: 0.5000 mg | ORAL_TABLET | Freq: Every evening | ORAL | 5 refills | Status: DC | PRN

## 2018-01-14 MED ORDER — INDAPAMIDE 1.25 MG PO TABS: 1.2500 mg | ORAL_TABLET | Freq: Every day | ORAL | 5 refills | Status: DC

## 2018-01-14 MED ORDER — AMLODIPINE BESYLATE 5 MG PO TABS: 5.0000 mg | ORAL_TABLET | Freq: Every day | ORAL | 5 refills | Status: DC

## 2018-01-14 NOTE — Patient Instructions (Signed)
DASH Eating Plan DASH stands for "Dietary Approaches to Stop Hypertension." The DASH eating plan is a healthy eating plan that has been shown to reduce high blood pressure (hypertension). It may also reduce your risk for type 2 diabetes, heart disease, and stroke. The DASH eating plan may also help with weight loss. What are tips for following this plan? General guidelines  Avoid eating more than 2,300 mg (milligrams) of salt (sodium) a day. If you have hypertension, you may need to reduce your sodium intake to 1,500 mg a day.  Limit alcohol intake to no more than 1 drink a day for nonpregnant women and 2 drinks a day for men. One drink equals 12 oz of beer, 5 oz of wine, or 1 oz of hard liquor.  Work with your health care provider to maintain a healthy body weight or to lose weight. Ask what an ideal weight is for you.  Get at least 30 minutes of exercise that causes your heart to beat faster (aerobic exercise) most days of the week. Activities may include walking, swimming, or biking.  Work with your health care provider or diet and nutrition specialist (dietitian) to adjust your eating plan to your individual calorie needs. Reading food labels  Check food labels for the amount of sodium per serving. Choose foods with less than 5 percent of the Daily Value of sodium. Generally, foods with less than 300 mg of sodium per serving fit into this eating plan.  To find whole grains, look for the word "whole" as the first word in the ingredient list. Shopping  Buy products labeled as "low-sodium" or "no salt added."  Buy fresh foods. Avoid canned foods and premade or frozen meals. Cooking  Avoid adding salt when cooking. Use salt-free seasonings or herbs instead of table salt or sea salt. Check with your health care provider or pharmacist before using salt substitutes.  Do not fry foods. Cook foods using healthy methods such as baking, boiling, grilling, and broiling instead.  Cook with  heart-healthy oils, such as olive, canola, soybean, or sunflower oil. Meal planning   Eat a balanced diet that includes: ? 5 or more servings of fruits and vegetables each day. At each meal, try to fill half of your plate with fruits and vegetables. ? Up to 6-8 servings of whole grains each day. ? Less than 6 oz of lean meat, poultry, or fish each day. A 3-oz serving of meat is about the same size as a deck of cards. One egg equals 1 oz. ? 2 servings of low-fat dairy each day. ? A serving of nuts, seeds, or beans 5 times each week. ? Heart-healthy fats. Healthy fats called Omega-3 fatty acids are found in foods such as flaxseeds and coldwater fish, like sardines, salmon, and mackerel.  Limit how much you eat of the following: ? Canned or prepackaged foods. ? Food that is high in trans fat, such as fried foods. ? Food that is high in saturated fat, such as fatty meat. ? Sweets, desserts, sugary drinks, and other foods with added sugar. ? Full-fat dairy products.  Do not salt foods before eating.  Try to eat at least 2 vegetarian meals each week.  Eat more home-cooked food and less restaurant, buffet, and fast food.  When eating at a restaurant, ask that your food be prepared with less salt or no salt, if possible. What foods are recommended? The items listed may not be a complete list. Talk with your dietitian about what   dietary choices are best for you. Grains Whole-grain or whole-wheat bread. Whole-grain or whole-wheat pasta. Brown rice. Oatmeal. Quinoa. Bulgur. Whole-grain and low-sodium cereals. Pita bread. Low-fat, low-sodium crackers. Whole-wheat flour tortillas. Vegetables Fresh or frozen vegetables (raw, steamed, roasted, or grilled). Low-sodium or reduced-sodium tomato and vegetable juice. Low-sodium or reduced-sodium tomato sauce and tomato paste. Low-sodium or reduced-sodium canned vegetables. Fruits All fresh, dried, or frozen fruit. Canned fruit in natural juice (without  added sugar). Meat and other protein foods Skinless chicken or turkey. Ground chicken or turkey. Pork with fat trimmed off. Fish and seafood. Egg whites. Dried beans, peas, or lentils. Unsalted nuts, nut butters, and seeds. Unsalted canned beans. Lean cuts of beef with fat trimmed off. Low-sodium, lean deli meat. Dairy Low-fat (1%) or fat-free (skim) milk. Fat-free, low-fat, or reduced-fat cheeses. Nonfat, low-sodium ricotta or cottage cheese. Low-fat or nonfat yogurt. Low-fat, low-sodium cheese. Fats and oils Soft margarine without trans fats. Vegetable oil. Low-fat, reduced-fat, or light mayonnaise and salad dressings (reduced-sodium). Canola, safflower, olive, soybean, and sunflower oils. Avocado. Seasoning and other foods Herbs. Spices. Seasoning mixes without salt. Unsalted popcorn and pretzels. Fat-free sweets. What foods are not recommended? The items listed may not be a complete list. Talk with your dietitian about what dietary choices are best for you. Grains Baked goods made with fat, such as croissants, muffins, or some breads. Dry pasta or rice meal packs. Vegetables Creamed or fried vegetables. Vegetables in a cheese sauce. Regular canned vegetables (not low-sodium or reduced-sodium). Regular canned tomato sauce and paste (not low-sodium or reduced-sodium). Regular tomato and vegetable juice (not low-sodium or reduced-sodium). Pickles. Olives. Fruits Canned fruit in a light or heavy syrup. Fried fruit. Fruit in cream or butter sauce. Meat and other protein foods Fatty cuts of meat. Ribs. Fried meat. Bacon. Sausage. Bologna and other processed lunch meats. Salami. Fatback. Hotdogs. Bratwurst. Salted nuts and seeds. Canned beans with added salt. Canned or smoked fish. Whole eggs or egg yolks. Chicken or turkey with skin. Dairy Whole or 2% milk, cream, and half-and-half. Whole or full-fat cream cheese. Whole-fat or sweetened yogurt. Full-fat cheese. Nondairy creamers. Whipped toppings.  Processed cheese and cheese spreads. Fats and oils Butter. Stick margarine. Lard. Shortening. Ghee. Bacon fat. Tropical oils, such as coconut, palm kernel, or palm oil. Seasoning and other foods Salted popcorn and pretzels. Onion salt, garlic salt, seasoned salt, table salt, and sea salt. Worcestershire sauce. Tartar sauce. Barbecue sauce. Teriyaki sauce. Soy sauce, including reduced-sodium. Steak sauce. Canned and packaged gravies. Fish sauce. Oyster sauce. Cocktail sauce. Horseradish that you find on the shelf. Ketchup. Mustard. Meat flavorings and tenderizers. Bouillon cubes. Hot sauce and Tabasco sauce. Premade or packaged marinades. Premade or packaged taco seasonings. Relishes. Regular salad dressings. Where to find more information:  National Heart, Lung, and Blood Institute: www.nhlbi.nih.gov  American Heart Association: www.heart.org Summary  The DASH eating plan is a healthy eating plan that has been shown to reduce high blood pressure (hypertension). It may also reduce your risk for type 2 diabetes, heart disease, and stroke.  With the DASH eating plan, you should limit salt (sodium) intake to 2,300 mg a day. If you have hypertension, you may need to reduce your sodium intake to 1,500 mg a day.  When on the DASH eating plan, aim to eat more fresh fruits and vegetables, whole grains, lean proteins, low-fat dairy, and heart-healthy fats.  Work with your health care provider or diet and nutrition specialist (dietitian) to adjust your eating plan to your individual   calorie needs. This information is not intended to replace advice given to you by your health care provider. Make sure you discuss any questions you have with your health care provider. Document Released: 02/01/2011 Document Revised: 02/06/2016 Document Reviewed: 02/06/2016 Elsevier Interactive Patient Education  2018 Elsevier Inc.  

## 2018-01-14 NOTE — Addendum Note (Signed)
Addended by: Lilyan PuntLUKING, Gerhart Ruggieri A on: 01/14/2018 09:40 PM   Modules accepted: Level of Service

## 2018-01-14 NOTE — Progress Notes (Addendum)
   Subjective:    Patient ID: Erica Graves, female    DOB: [birth year], 52 y.o.   MRN: [id number]  HPI  Patient arrives for a follow up on recent elevated blood pressures. Patient states she is doing better since starting her new medication. No problems or concerns. Taking the medication Watching salt in the diet Exercising some Denies any chest pain shortness of breath or headaches PMH benign  We also talked at length about the importance of minimizing cholesterol in the diet to lower the risk of heart disease plus also doing lab work in the near future to check kidney function potassium and cholesterol Review of Systems  Constitutional: Negative for activity change, appetite change and fatigue.  HENT: Negative for congestion and rhinorrhea.   Respiratory: Negative for cough and shortness of breath.   Cardiovascular: Negative for chest pain and leg swelling.  Gastrointestinal: Negative for abdominal pain and diarrhea.  Endocrine: Negative for polydipsia and polyphagia.  Skin: Negative for color change.  Neurological: Negative for dizziness and weakness.  Psychiatric/Behavioral: Negative for behavioral problems and confusion.       Objective:   Physical Exam  Constitutional: She appears well-nourished. No distress.  HENT:  Head: Normocephalic and atraumatic.  Eyes: Right eye exhibits no discharge. Left eye exhibits no discharge.  Neck: No tracheal deviation present.  Cardiovascular: Normal rate, regular rhythm and normal heart sounds.  No murmur heard. Pulmonary/Chest: Effort normal and breath sounds normal. No respiratory distress.  Musculoskeletal: She exhibits no edema.  Lymphadenopathy:    She has no cervical adenopathy.  Neurological: She is alert. Coordination normal.  Skin: Skin is warm and dry.  Psychiatric: She has a normal mood and affect. Her behavior is normal.  Vitals reviewed.    Patient encouraged to get mammogram done     Assessment & Plan:  HTN-  Patient was seen today as part of a visit regarding hypertension. The importance of healthy diet and regular physical activity was discussed. The importance of compliance with medications discussed.  Ideal goal is to keep blood pressure low elevated levels certainly below 140/90 when possible.  The patient was counseled that keeping blood pressure under control lessen his risk of complications.  The importance of regular follow-ups was discussed with the patient.  Low-salt diet such as DASH recommended.  Regular physical activity was recommended as well.  Patient was advised to keep regular follow-ups. Follow-up in 6 months sooner if any problems Wellness exam recommended

## 2018-02-02 ENCOUNTER — Encounter: Payer: Self-pay | Admitting: Family Medicine

## 2018-02-02 LAB — LIPID PANEL
CHOL/HDL RATIO: 3.5 ratio (ref 0.0–4.4)
CHOLESTEROL TOTAL: 181 mg/dL (ref 100–199)
HDL: 51 mg/dL (ref >39)
LDL CALC: 114 mg/dL — AB (ref 0–99)
Triglycerides: 78 mg/dL (ref 0–149)
VLDL Cholesterol Cal: 16 mg/dL (ref 5–40)

## 2018-02-02 LAB — BASIC METABOLIC PANEL
BUN / CREAT RATIO: 19 (ref 9–23)
BUN: 15 mg/dL (ref 6–24)
CALCIUM: 9.3 mg/dL (ref 8.7–10.2)
CO2: 23 mmol/L (ref 20–29)
Chloride: 104 mmol/L (ref 96–106)
Creatinine, Ser: 0.77 mg/dL (ref 0.57–1.00)
GFR calc Af Amer: 101 mL/min/{1.73_m2} (ref >59)
GFR, EST NON AFRICAN AMERICAN: 88 mL/min/{1.73_m2} (ref >59)
Glucose: 89 mg/dL (ref 65–99)
Potassium: 4 mmol/L (ref 3.5–5.2)
Sodium: 140 mmol/L (ref 134–144)

## 2018-02-17 ENCOUNTER — Ambulatory Visit: Payer: BC Managed Care – PPO | Admitting: Orthopedic Surgery

## 2018-03-16 ENCOUNTER — Other Ambulatory Visit: Payer: Self-pay

## 2018-03-16 ENCOUNTER — Emergency Department (HOSPITAL_COMMUNITY)
Admission: EM | Admit: 2018-03-16 | Discharge: 2018-03-16 | Disposition: A | Discharge status: Home / Self Care | Payer: BC Managed Care – PPO | Attending: Emergency Medicine | Admitting: Emergency Medicine

## 2018-03-16 ENCOUNTER — Encounter (HOSPITAL_COMMUNITY): Payer: Self-pay | Admitting: Emergency Medicine

## 2018-03-16 ENCOUNTER — Emergency Department (HOSPITAL_COMMUNITY): Payer: BC Managed Care – PPO

## 2018-03-16 DIAGNOSIS — I10 Essential (primary) hypertension: Secondary | ICD-10-CM | POA: Insufficient documentation

## 2018-03-16 DIAGNOSIS — R079 Chest pain, unspecified: Secondary | ICD-10-CM | POA: Diagnosis present

## 2018-03-16 DIAGNOSIS — R0789 Other chest pain: Secondary | ICD-10-CM | POA: Diagnosis not present

## 2018-03-16 DIAGNOSIS — Z79899 Other long term (current) drug therapy: Secondary | ICD-10-CM | POA: Diagnosis not present

## 2018-03-16 HISTORY — DX: Essential (primary) hypertension: I10

## 2018-03-16 LAB — BASIC METABOLIC PANEL
Anion gap: 9 (ref 5–15)
BUN: 14 mg/dL (ref 6–20)
CO2: 26 mmol/L (ref 22–32)
Calcium: 9.3 mg/dL (ref 8.9–10.3)
Chloride: 104 mmol/L (ref 98–111)
Creatinine, Ser: 0.71 mg/dL (ref 0.44–1.00)
GFR calc Af Amer: >60 mL/min (ref >60)
GFR calc non Af Amer: >60 mL/min (ref >60)
Glucose, Bld: 92 mg/dL (ref 70–99)
Potassium: 3.4 mmol/L — ABNORMAL LOW (ref 3.5–5.1)
Sodium: 139 mmol/L (ref 135–145)

## 2018-03-16 LAB — CBC
HCT: 42.2 % (ref 36.0–46.0)
Hemoglobin: 13.6 g/dL (ref 12.0–15.0)
MCH: 29.7 pg (ref 26.0–34.0)
MCHC: 32.2 g/dL (ref 30.0–36.0)
MCV: 92.1 fL (ref 80.0–100.0)
Platelets: 234 10*3/uL (ref 150–400)
RBC: 4.58 MIL/uL (ref 3.87–5.11)
RDW: 11.9 % (ref 11.5–15.5)
WBC: 5.5 10*3/uL (ref 4.0–10.5)
nRBC: 0 % (ref 0.0–0.2)

## 2018-03-16 LAB — TROPONIN I: Troponin I: <0.03 ng/mL (ref <0.03)

## 2018-03-16 MED ORDER — IBUPROFEN 800 MG PO TABS
800.0000 mg | ORAL_TABLET | Freq: Once | ORAL | Status: AC
  Administered 2018-03-16: 800 mg via ORAL
  Filled 2018-03-16: qty 1

## 2018-03-16 MED ORDER — METHOCARBAMOL 500 MG PO TABS: 500.0000 mg | ORAL_TABLET | Freq: Two times a day (BID) | ORAL | 0 refills | Status: DC | PRN

## 2018-03-16 MED ORDER — IBUPROFEN 600 MG PO TABS: 600.0000 mg | ORAL_TABLET | Freq: Four times a day (QID) | ORAL | 0 refills | Status: DC | PRN

## 2018-03-16 NOTE — Discharge Instructions (Signed)
Your tests all look normal without any signs of internal abnormalities.  There is no pneumonia, no heart attack, your blood work has been normal and your chest x-ray looked beautiful.  Please take the following medications  Robaxin twice a day as needed for muscle spasms  Ibuprofen 3 times a day as needed up to 600 mg per dose for pain  Warm compresses to help increase blood flow to these muscles  Thank you for letting us take care of you today!  Please obtain all of your results from medical records or have your doctors office obtain the results - share them with your doctor - you should be seen at your doctors office in the next 2 days. Call today to arrange your follow up. Take the medications as prescribed. Please review all of the medicines and only take them if you do not have an allergy to them. Please be aware that if you are taking birth control pills, taking other prescriptions, ESPECIALLY ANTIBIOTICS may make the birth control ineffective - if this is the case, either do not engage in sexual activity or use alternative methods of birth control such as condoms until you have finished the medicine and your family doctor says it is OK to restart them. If you are on a blood thinner such as COUMADIN, be aware that any other medicine that you take may cause the coumadin to either work too much, or not enough - you should have your coumadin level rechecked in next 7 days if this is the case.  ?  It is also a possibility that you have an allergic reaction to any of the medicines that you have been prescribed - Everybody reacts differently to medications and while MOST people have no trouble with most medicines, you may have a reaction such as nausea, vomiting, rash, swelling, shortness of breath. If this is the case, please stop taking the medicine immediately and contact your physician.   If you were given a medication in the ED such as percocet, vicodin, or morphine, be aware that these medicines  are sedating and may change your ability to take care of yourself adequately for several hours after being given this medicines - you should not drive or take care of small children if you were given this medicine in the Emergency Department or if you have been prescribed these types of medicines. ?   You should return to the ER IMMEDIATELY if you develop severe or worsening symptoms.

## 2018-03-16 NOTE — ED Triage Notes (Signed)
Pt c/o sharp stabbing pain to mid chest radiating up right shoulder and neck constant since yesterday. States moving and breathing makes pain worse.

## 2018-03-16 NOTE — ED Provider Notes (Signed)
[facility] EMERGENCY DEPARTMENT Provider Note   CSN: [id number] Arrival date & time: 03/16/18  1530     History   Chief Complaint Chief Complaint  Patient presents with  . Chest Pain    HPI Erica Graves is a 53 y.o. female.  HPI  53 year old female, she has no prior history of tobacco use, diabetes, hypercholesterolemia or heart disease.  She does have some hypertension.  She currently is taking medications including Norvasc.  She presents with chest pain that started last night at approximately bedtime, has been persistent longer than 12 hours, is located in the mid to upper chest, radiates into her bilateral neck and seems to be worse with deep breathing and with moving her head from side to side.  It is worse with positional changes but not exertion and is not associated with coughing, fevers, swelling.  She has no risk for pulmonary embolism.  She denies all red flags.  She has not had any exertional symptoms in the past  Past Medical History:  Diagnosis Date  . Carpal tunnel syndrome, bilateral   . Chronic low back pain   . Constipation   . Counseling for estrogen replacement therapy 07/07/2014  . Depression with anxiety   . Dyspareunia 07/07/2014  . History of kidney stones   . Hot flashes 07/07/2014  . Hypertension   . Menopause 07/07/2014  . Moody 07/07/2014  . Obesity   . Umbilical hernia     Patient Active Problem List   Diagnosis Date Noted  . S/P trigger finger release 01/24/17 02/06/2017  . Trigger finger of right thumb   . Essential hypertension, benign 11/18/2015  . Gastroesophageal reflux disease without esophagitis 05/06/2015  . Irritable bowel syndrome with constipation 05/06/2015  . Hot flashes 07/07/2014  . Dyspareunia 07/07/2014  . Moody 07/07/2014  . Menopause 07/07/2014  . Counseling for estrogen replacement therapy 07/07/2014  . Morbid obesity (HCC) 03/05/2014  . Carpal tunnel syndrome of right wrist 12/22/2013  . Vitamin D deficiency  10/14/2013  . Migraine headache without aura 11/24/2012  . Muscle contraction headache 11/24/2012  . CTS (carpal tunnel syndrome) 08/14/2012  . Stiffness of joint, not elsewhere classified, lower leg 04/22/2012  . Weakness of left leg 04/22/2012  . Difficulty walking 04/22/2012  . S/P total knee replacement, left 03/31/12 04/03/2012  . Arthritis of knee, degenerative 03/05/2012  . Osteoarthritis of left knee 12/12/2011  . S/P arthroscopy of left knee 06/13/2011  . Lateral meniscus tear 06/13/2011  . Stiffness of knee joint 06/05/2011  . Medial meniscus, posterior horn derangement 05/16/2011  . DEGENERATIVE JOINT DISEASE, KNEES, BILATERAL 12/19/2009  . 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 FOOT PAIN, LEFT 07/22/2007  . DEPRESSION/ANXIETY 12/27/2006  . SHOULDER PAIN, LEFT 12/27/2006  . OVERWEIGHT 11/12/2006  . CONSTIPATION 11/12/2006  . Lumbago 11/12/2006    Past Surgical History:  Procedure Laterality Date  . ABDOMINAL HYSTERECTOMY    . BACK SURGERY     removal of cyst subdermal  . CARPAL TUNNEL RELEASE Left 09/30/2014   Procedure: CARPAL TUNNEL RELEASE;  Surgeon: Vickki HearingStanley E Harrison, MD;  Location: [facility];  Service: Orthopedics;  Laterality: Left;  . CARPAL TUNNEL RELEASE Right 09/28/2015   Procedure: CARPAL TUNNEL RELEASE;  Surgeon: Vickki HearingStanley E Harrison, MD;  Location: [facility];  Service: Orthopedics;  Laterality: Right;  . COLONOSCOPY N/A 06/29/2013   Procedure: COLONOSCOPY;  Surgeon: West BaliSandi L Fields, MD;  Location: [facility];  Service: Endoscopy;  Laterality: N/A;  1:30  . KNEE ARTHROSCOPY  Right   . KNEE ARTHROSCOPY  05/21/2011   Procedure: ARTHROSCOPY KNEE;  Surgeon: Vickki Hearing, MD;  Location: [facility];  Service: Orthopedics;  Laterality: Left;  lateral menisectomy  . KNEE CLOSED REDUCTION Left 05/26/2012   Procedure: CLOSED MANIPULATION KNEE;  Surgeon: Vickki Hearing, MD;  Location: [facility];  Service: Orthopedics;  Laterality: Left;  . left knee arthroscopy  08/14/2005 Dr.  Romeo Apple torn meniscus  . LUMBAR SPINE SURGERY    . PARTIAL HYSTERECTOMY    . TOTAL KNEE ARTHROPLASTY  03/31/2012   Procedure: TOTAL KNEE ARTHROPLASTY;  Surgeon: Vickki Hearing, MD;  Location: [facility];  Service: Orthopedics;  Laterality: Left;  Left Total Knee Arthroplasty  . TRIGGER FINGER RELEASE Right 01/24/2017   Procedure: RELEASE TRIGGER FINGER/A-1 PULLEY right thumb;  Surgeon: Vickki Hearing, MD;  Location: [facility];  Service: Orthopedics;  Laterality: Right;     OB History    Gravida  3   Para  3   Term      Preterm      AB      Living        SAB      TAB      Ectopic      Multiple      Live Births               Home Medications    Prior to Admission medications   Medication Sig Start Date End Date Taking? Authorizing Provider  amLODipine (NORVASC) 5 MG tablet Take 1 tablet (5 mg total) by mouth daily. 01/14/18  Yes Babs Sciara, MD  cholecalciferol (VITAMIN D) 1000 units tablet Take 1,000 Units by mouth daily.   Yes [provider]  indapamide (LOZOL) 1.25 MG tablet Take 1 tablet (1.25 mg total) by mouth daily. 01/14/18  Yes Babs Sciara, MD  ibuprofen (ADVIL,MOTRIN) 600 MG tablet Take 1 tablet (600 mg total) by mouth every 6 (six) hours as needed. 03/16/18   Eber Hong, MD  methocarbamol (ROBAXIN) 500 MG tablet Take 1 tablet (500 mg total) by mouth 2 (two) times daily as needed for muscle spasms. 03/16/18   Eber Hong, MD    Family History Family History  Problem Relation Age of Onset  . Heart disease Mother   . Hypertension Mother   . Colon cancer Neg Hx     Social History Social History   Tobacco Use  . Smoking status: Never Smoker  . Smokeless tobacco: Never Used  Substance Use Topics  . Alcohol use: Yes    Comment: occ  . Drug use: No     Allergies   Norco [hydrocodone-acetaminophen]   Review of Systems Review of Systems  All other systems reviewed and are negative.    Physical Exam Updated Vital  Signs BP 140/79 (BP Location: Right Arm)   Pulse 65   Temp 98 F (36.7 C) (Oral)   Resp 18   SpO2 99%   Physical Exam Vitals signs and nursing note reviewed.  Constitutional:      General: She is not in acute distress.    Appearance: She is well-developed.  HENT:     Head: Normocephalic and atraumatic.     Mouth/Throat:     Pharynx: No oropharyngeal exudate.  Eyes:     General: No scleral icterus.       Right eye: No discharge.        Left eye: No discharge.     Conjunctiva/sclera: Conjunctivae normal.  Pupils: Pupils are equal, round, and reactive to light.  Neck:     Musculoskeletal: Normal range of motion and neck supple.     Thyroid: No thyromegaly.     Vascular: No JVD.  Cardiovascular:     Rate and Rhythm: Normal rate and regular rhythm.     Heart sounds: Normal heart sounds. No murmur. No friction rub. No gallop.   Pulmonary:     Effort: Pulmonary effort is normal. No respiratory distress.     Breath sounds: Normal breath sounds. No wheezing or rales.  Chest:     Chest wall: Tenderness present.  Abdominal:     General: Bowel sounds are normal. There is no distension.     Palpations: Abdomen is soft. There is no mass.     Tenderness: There is no abdominal tenderness.  Musculoskeletal: Normal range of motion.        General: No tenderness.  Lymphadenopathy:     Cervical: No cervical adenopathy.  Skin:    General: Skin is warm and dry.     Findings: No erythema or rash.  Neurological:     Mental Status: She is alert.     Coordination: Coordination normal.  Psychiatric:        Behavior: Behavior normal.      ED Treatments / Results  Labs (all labs ordered are listed, but only abnormal results are displayed) Labs Reviewed  BASIC METABOLIC PANEL - Abnormal; Notable for the following components:      Result Value   Potassium 3.4 (*)    All other components within normal limits  TROPONIN I  CBC    EKG EKG Interpretation  Date/Time:  Sunday  March 16 2018 15:36:52 EST Ventricular Rate:  58 PR Interval:    QRS Duration: 101 QT Interval:  431 QTC Calculation: 424 R Axis:   21 Text Interpretation:  Sinus rhythm Low voltage, precordial leads Borderline T abnormalities, anterior leads since last tracing no significant change Q wavews in inferior leads seen on prior. Confirmed by Eber Hong ([id number]) on 03/16/2018 3:42:50 PM   Radiology Dg Chest 2 View  Result Date: 03/16/2018 CLINICAL DATA:  Chest pain for 2 days EXAM: CHEST - 2 VIEW COMPARISON:  03/12/2017 FINDINGS: Cardiac shadow is within normal limits. The lungs are well aerated bilaterally. Bibasilar atelectatic changes are seen without sizable effusion or focal infiltrate. No acute bony abnormality is noted. IMPRESSION: Mild bibasilar atelectasis. Electronically Signed   By: Alcide Clever M.D.   On: 03/16/2018 16:48    Procedures Procedures (including critical care time)  Medications Ordered in ED Medications  ibuprofen (ADVIL,MOTRIN) tablet 800 mg (800 mg Oral Given 03/16/18 1549)     Initial Impression / Assessment and Plan / ED Course  I have reviewed the triage vital signs and the nursing notes.  Pertinent labs & imaging results that were available during my care of the patient were reviewed by me and considered in my medical decision making (see chart for details).  Clinical Course as of Mar 16 1712  Wynelle Link Mar 16, 2018  1712 No findings of pneumothorax or cardiomegaly or abnormal mediastinum.  Blood counts metabolic panel and troponin are also unremarkable.  The patient will be prescribed an anti-inflammatory and a muscle relaxant and discharged home.  She appears stable otherwise   [BM]    Clinical Course User Index [BM] Eber Hong, MD    The EKG is unchanged from prior, she has some Q waves in the inferior leads but  this has been seen in the past.  She has no signs of ST elevation or depression, no arrhythmia.  At this time I think the patient has  reproducible pain to palpation on both the neck and the chest and has no signs of exertional symptoms.  With over 12 hours of symptoms a single troponin will be enough for a rule out.  Anti-inflammatories ordered, rule out pneumothorax.  No signs of pericarditis.  Final Clinical Impressions(s) / ED Diagnoses   Final diagnoses:  Chest wall pain    ED Discharge Orders         Ordered    ibuprofen (ADVIL,MOTRIN) 600 MG tablet  Every 6 hours PRN     03/16/18 1713    methocarbamol (ROBAXIN) 500 MG tablet  2 times daily PRN     03/16/18 1713           Eber Hong, MD 03/16/18 1714

## 2018-03-26 ENCOUNTER — Ambulatory Visit: Payer: BC Managed Care – PPO | Admitting: Family Medicine

## 2018-04-14 IMAGING — DX DG KNEE COMPLETE 4+V*R*
4 series · 4 of 4 positions shown · non-contrast
Comparison: 03/31/2012.

CLINICAL DATA: Posterior knee pain.  No known injury.

EXAM:
RIGHT KNEE - COMPLETE 4+ VIEW

[knee ap]
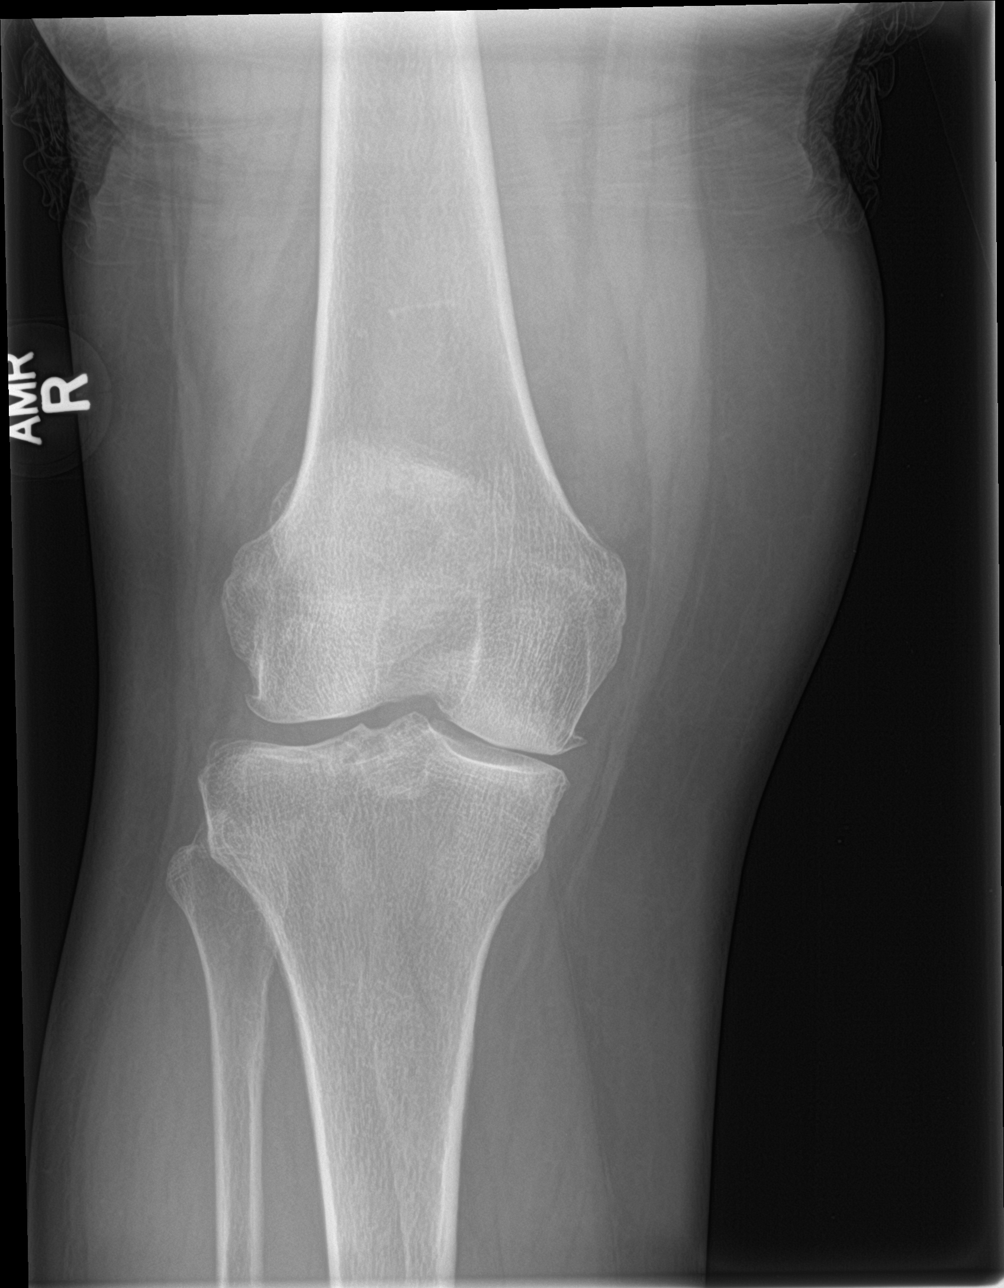

[tunnel]
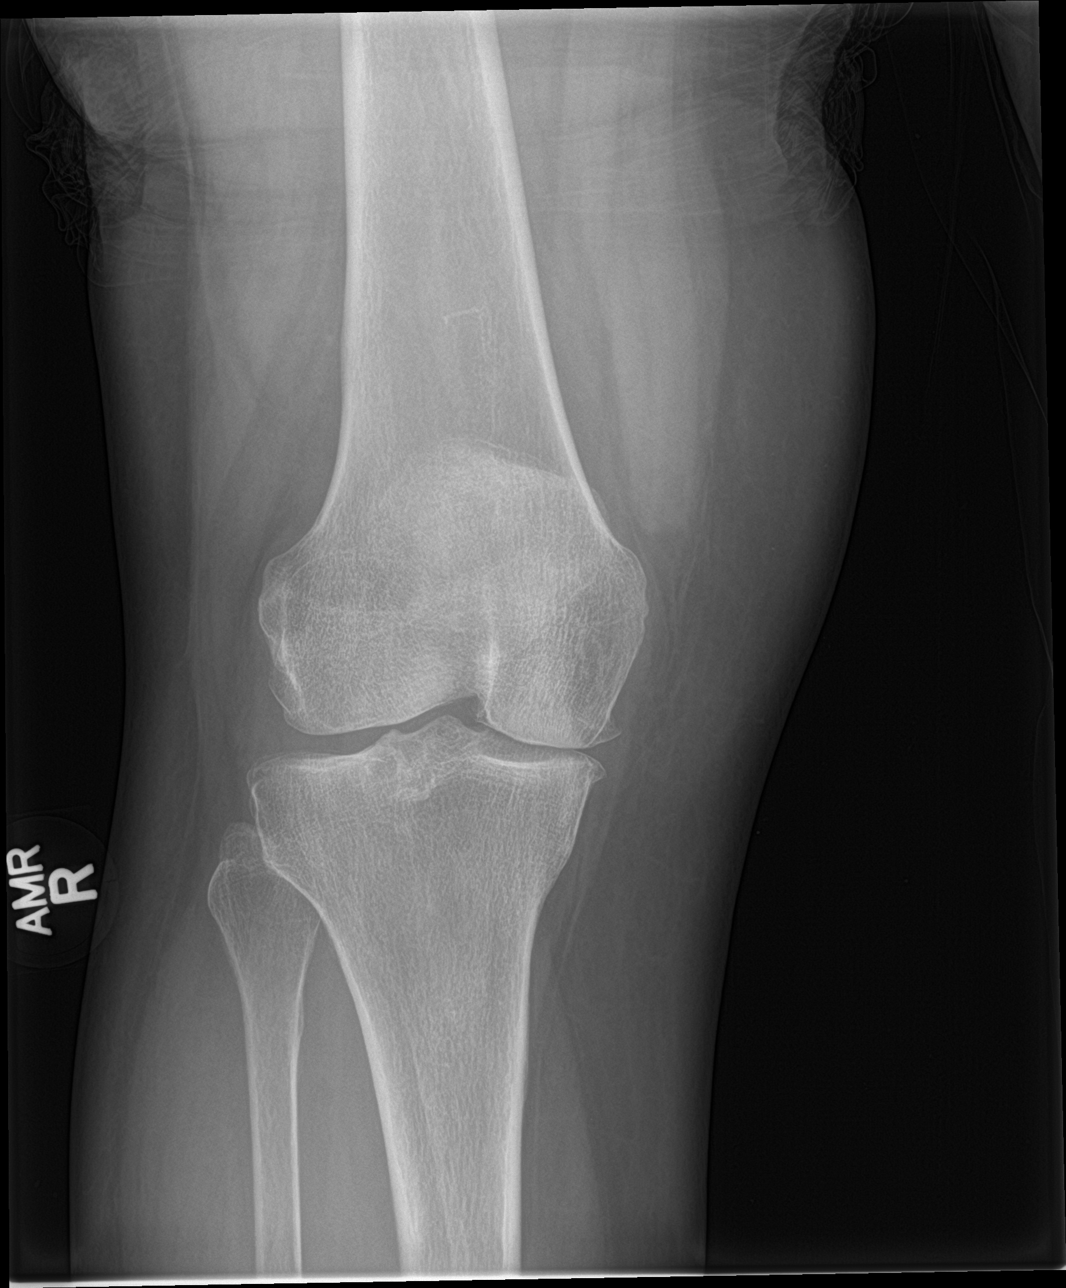

[knee lat]
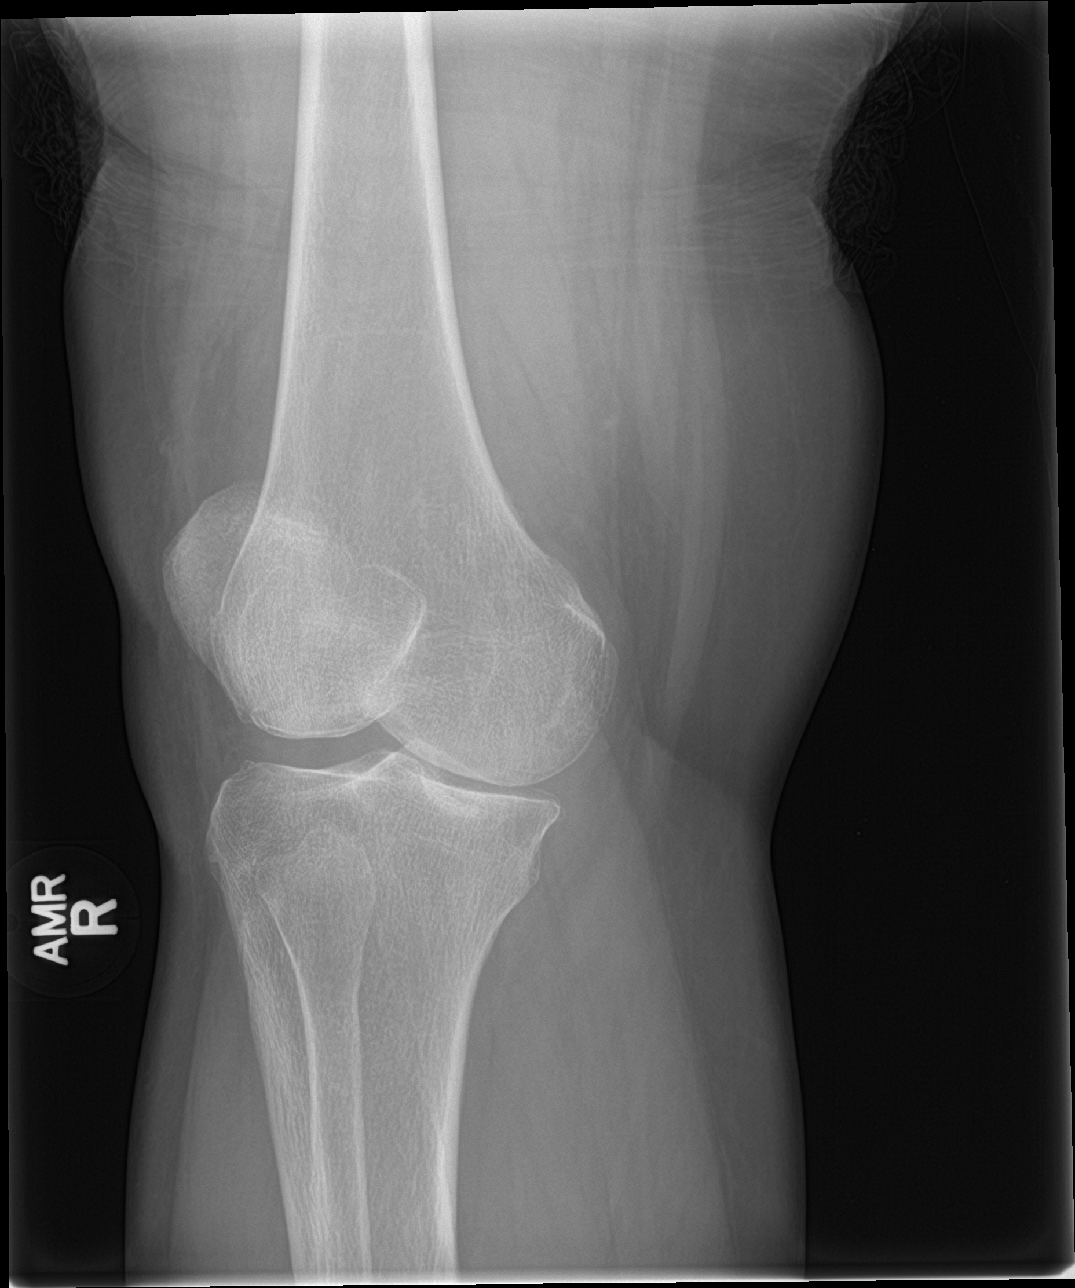

[knee sunrise]
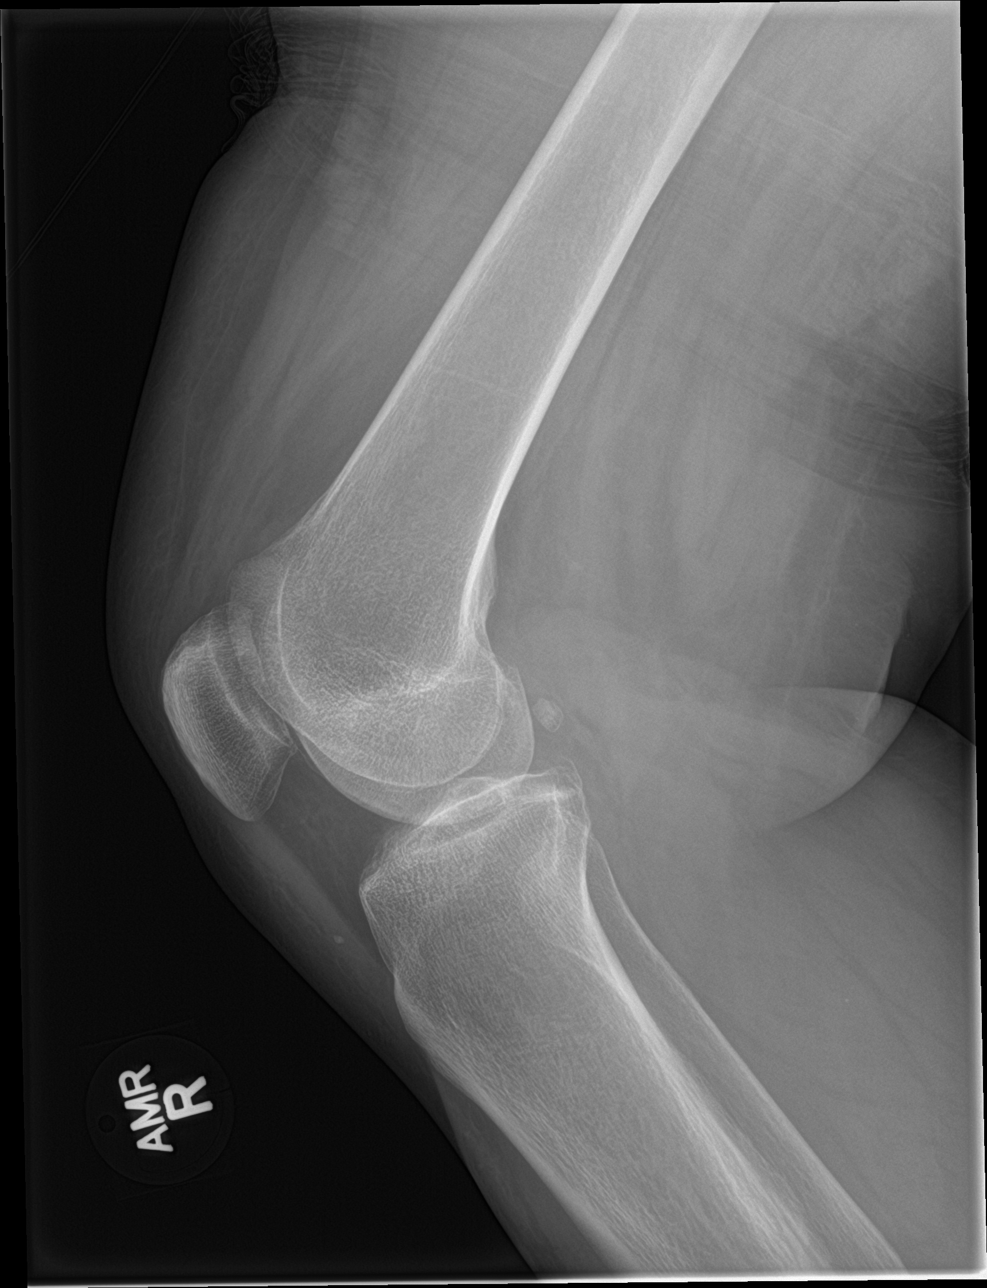

[4 of 4 positions shown; findings below may reference images not displayed]

FINDINGS: Tricompartment degenerative change. Degenerative changes most
prominent about the medial compartment. Mild lateral subluxation of
the tibia noted. No acute bony or joint abnormality identified.
IMPRESSION: Tricompartment degenerative change, most prominent about the medial
compartment. There is associated mild lateral tibial subluxation.

## 2018-07-16 ENCOUNTER — Other Ambulatory Visit: Payer: Self-pay

## 2018-07-16 ENCOUNTER — Ambulatory Visit (INDEPENDENT_AMBULATORY_CARE_PROVIDER_SITE_OTHER): Payer: BC Managed Care – PPO | Admitting: Family Medicine

## 2018-07-16 VITALS — BP 136/84

## 2018-07-16 DIAGNOSIS — G4700 Insomnia, unspecified: Secondary | ICD-10-CM | POA: Diagnosis not present

## 2018-07-16 DIAGNOSIS — I10 Essential (primary) hypertension: Secondary | ICD-10-CM

## 2018-07-16 MED ORDER — AMLODIPINE BESYLATE 5 MG PO TABS: 5.0000 mg | ORAL_TABLET | Freq: Every day | ORAL | 1 refills | Status: DC

## 2018-07-16 MED ORDER — LORAZEPAM 0.5 MG PO TABS: ORAL_TABLET | ORAL | 1 refills | Status: DC

## 2018-07-16 MED ORDER — INDAPAMIDE 1.25 MG PO TABS: 1.2500 mg | ORAL_TABLET | Freq: Every day | ORAL | 1 refills | Status: DC

## 2018-07-16 NOTE — Progress Notes (Signed)
   Subjective:    Patient ID: Erica Graves, female    DOB: 04-10-1965, 53 y.o.   MRN: [id number]  Hypertension  This is a chronic problem. The current episode started more than 1 year ago. Pertinent negatives include no chest pain, headaches or shortness of breath. Risk factors for coronary artery disease include post-menopausal state. Treatments tried: norvasc and lozol. There are no compliance problems.    Significant blood pressure issues.  Does take her medicine on a regular basis.  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  HENT: Negative for congestion and rhinorrhea.   Respiratory: Negative for cough, chest tightness and shortness of breath.   Cardiovascular: Negative for chest pain and leg swelling.  Gastrointestinal: Negative for abdominal pain and nausea.  Skin: Negative for color change.  Neurological: Negative for dizziness and headaches.  Psychiatric/Behavioral: Negative for agitation and behavioral problems.       Objective:   Physical Exam Vitals signs reviewed.  Constitutional:      General: She is not in acute distress.    Appearance: She is well-developed.  HENT:     Head: Normocephalic and atraumatic.  Eyes:     General:        Right eye: No discharge.        Left eye: No discharge.  Neck:     Trachea: No tracheal deviation.  Cardiovascular:     Rate and Rhythm: Normal rate and regular rhythm.     Heart sounds: Normal heart sounds. No murmur.  Pulmonary:     Effort: Pulmonary effort is normal. No respiratory distress.     Breath sounds: Normal breath sounds. No wheezing or rales.  Lymphadenopathy:     Cervical: No cervical adenopathy.   Skin:    General: Skin is warm and dry.     Findings: No erythema.  Neurological:     Mental Status: She is alert.     Motor: No abnormal muscle tone.  Psychiatric:        Behavior: Behavior normal.           Assessment & Plan:  Proper sleep hygiene Lorazepam half tablet 401 tablet at nighttime to help with sleep caution drowsiness do not use every night  HTN- Patient was seen today as part of a visit regarding hypertension. The importance of healthy diet and regular physical activity was discussed. The importance of compliance with medications discussed.  Ideal goal is to keep blood pressure low elevated levels certainly below 140/90 when possible.  The patient was counseled that keeping blood pressure under control lessen his risk of complications.  The importance of regular follow-ups was discussed with the patient.  Low-salt diet such as DASH recommended.  Regular physical activity was recommended as well.  Patient was advised to keep regular follow-ups. Decent control check metabolic 7 later this summer follow-up on a regular basis for female health checkup

## 2018-07-16 NOTE — Patient Instructions (Signed)

## 2018-07-27 ENCOUNTER — Other Ambulatory Visit: Payer: Self-pay | Admitting: Family Medicine

## 2018-07-28 ENCOUNTER — Other Ambulatory Visit: Payer: Self-pay

## 2018-07-28 ENCOUNTER — Ambulatory Visit (INDEPENDENT_AMBULATORY_CARE_PROVIDER_SITE_OTHER): Payer: BC Managed Care – PPO | Admitting: Family Medicine

## 2018-07-28 DIAGNOSIS — N3000 Acute cystitis without hematuria: Secondary | ICD-10-CM | POA: Diagnosis not present

## 2018-07-28 MED ORDER — CEPHALEXIN 500 MG PO CAPS: 500.0000 mg | ORAL_CAPSULE | Freq: Four times a day (QID) | ORAL | 0 refills | Status: DC

## 2018-07-28 NOTE — Progress Notes (Signed)
   Subjective:  AV  Patient ID: Erica Graves, female    DOB: 1965-06-18, 53 y.o.   MRN: [id number]  HPIburning with urination and low back pain. Started 4 days ago.  Patient with dysuria and urinary frequency lower abdominal pain lower back pain denies high fever chills sweats denies hematuria PMH benign Virtual Visit via Video Note  I connected with [name] on 07/28/18 at  3:00 PM EDT by a video enabled telemedicine application and verified that I am speaking with the correct person using two identifiers.  Location: Patient: home Provider: office   I discussed the limitations of evaluation and management by telemedicine and the availability of in person appointments. The patient expressed understanding and agreed to proceed.  History of Present Illness:    Observations/Objective:   Assessment and Plan:   Follow Up Instructions:    I discussed the assessment and treatment plan with the patient. The patient was provided an opportunity to ask questions and all were answered. The patient agreed with the plan and demonstrated an understanding of the instructions.   The patient was advised to call back or seek an in-person evaluation if the symptoms worsen or if the condition fails to improve as anticipated.  I provided 15 minutes of non-face-to-face time during this encounter.   [name] is been having some lower abdominal discomfort for the past 2 to 3 days with dysuria and urinary frequency some lower back pain but no high fevers no vomiting or diarrhea 3   Review of Systems  Constitutional: Negative for activity change, fatigue and fever.  HENT: Negative for congestion and rhinorrhea.   Respiratory: Negative for cough, chest tightness and shortness of breath.   Cardiovascular: Negative for chest pain and leg swelling.  Gastrointestinal: Negative for abdominal pain and nausea.  Skin: Negative for color change.  Neurological: Negative for dizziness and headaches.   Psychiatric/Behavioral: Negative for agitation and behavioral problems.       Objective:   Physical Exam T Patient had virtual visit Appears to be in no distress Atraumatic Neuro able to relate and oriented No apparent resp distress Color normal      Assessment & Plan:  UTI antibiotic prescribed warning signs discussed follow-up if problems

## 2018-10-08 ENCOUNTER — Ambulatory Visit: Payer: Self-pay | Admitting: Orthopedic Surgery

## 2018-10-27 ENCOUNTER — Ambulatory Visit: Payer: BC Managed Care – PPO

## 2018-10-27 ENCOUNTER — Other Ambulatory Visit: Payer: Self-pay

## 2018-10-27 ENCOUNTER — Encounter: Payer: Self-pay | Admitting: Orthopedic Surgery

## 2018-10-27 ENCOUNTER — Ambulatory Visit: Payer: BC Managed Care – PPO | Admitting: Orthopedic Surgery

## 2018-10-27 VITALS — BP 152/82 | HR 64 | Temp 97.2°F | Ht 65.0 in | Wt 208.0 lb

## 2018-10-27 DIAGNOSIS — Z96652 Presence of left artificial knee joint: Secondary | ICD-10-CM | POA: Diagnosis not present

## 2018-10-27 DIAGNOSIS — M5410 Radiculopathy, site unspecified: Secondary | ICD-10-CM | POA: Diagnosis not present

## 2018-10-27 MED ORDER — PREDNISONE 10 MG (48) PO TBPK: ORAL_TABLET | Freq: Every day | ORAL | 0 refills | Status: DC

## 2018-10-27 MED ORDER — METHOCARBAMOL 500 MG PO TABS: 500.0000 mg | ORAL_TABLET | Freq: Two times a day (BID) | ORAL | 0 refills | Status: DC | PRN

## 2018-10-27 NOTE — Progress Notes (Signed)
ANNUAL FOLLOW UP FOR  left TKA   Chief Complaint  Patient presents with  . Routine Post Op    left knee replacement 03/31/12    6 yrs out depuy sigma fb ps   HPI: The patient is here for the annual  follow-up x-ray for knee replacement. The patient is not complaining of pain weakness instability or stiffness in the repaired knee.   Review of Systems  Musculoskeletal: Positive for back pain.  Neurological: Positive for tingling.   Examination of the left KNEE  BP (!) 152/82   Pulse 64   Temp (!) 97.2 F (36.2 C)   Ht 5\' 5"  (1.651 m)   Wt 208 lb (94.3 kg)   BMI 34.61 kg/m   General the patient is normally groomed in no distress  Inspection shows : incision healed nicely without erythema, no tenderness no swelling  Range of motion total range of motion is 120  Stability the knee is stable anterior to posterior as well as medial to lateral  Strength quadriceps strength is normal  Skin no erythema around the skin incision  Neuro: normal sensation in the operative leg   Gait: normal expected gait without cane    Medical decision-making section  X-rays ordered, internal imaging shows (see full dictated report) stable implant with no signs of loosening  Diagnosis  Encounter Diagnosis  Name Primary?  . S/P total knee replacement, left 03/31/12 Yes   Plan follow-up 1 year repeat x-rays   Addendum the patient says she is having some radicular leg pain on the left side and some back pain so I gave her a Dosepak and Robaxin and told her she can have a standing order to get it and take it when necessary taking the Robaxin at night and then the Dosepak when she feels persistent numbness and tingling in her left leg

## 2018-11-28 ENCOUNTER — Other Ambulatory Visit (HOSPITAL_COMMUNITY): Payer: Self-pay | Admitting: Family Medicine

## 2018-11-28 DIAGNOSIS — Z1231 Encounter for screening mammogram for malignant neoplasm of breast: Secondary | ICD-10-CM

## 2018-12-04 ENCOUNTER — Ambulatory Visit (HOSPITAL_COMMUNITY)
Admission: RE | Admit: 2018-12-04 | Discharge: 2018-12-04 | Disposition: A | Discharge status: Home / Self Care | Payer: BC Managed Care – PPO | Source: Ambulatory Visit | Attending: Family Medicine | Admitting: Family Medicine

## 2018-12-04 ENCOUNTER — Other Ambulatory Visit: Payer: Self-pay

## 2018-12-04 DIAGNOSIS — Z1231 Encounter for screening mammogram for malignant neoplasm of breast: Secondary | ICD-10-CM | POA: Insufficient documentation

## 2018-12-08 ENCOUNTER — Ambulatory Visit (INDEPENDENT_AMBULATORY_CARE_PROVIDER_SITE_OTHER): Payer: BC Managed Care – PPO

## 2018-12-08 ENCOUNTER — Ambulatory Visit: Payer: BC Managed Care – PPO | Admitting: Podiatry

## 2018-12-08 ENCOUNTER — Encounter: Payer: Self-pay | Admitting: Podiatry

## 2018-12-08 ENCOUNTER — Other Ambulatory Visit: Payer: Self-pay

## 2018-12-08 VITALS — BP 150/93 | HR 76 | Resp 16

## 2018-12-08 DIAGNOSIS — B351 Tinea unguium: Secondary | ICD-10-CM

## 2018-12-08 DIAGNOSIS — M722 Plantar fascial fibromatosis: Secondary | ICD-10-CM

## 2018-12-08 MED ORDER — DICLOFENAC SODIUM 75 MG PO TBEC: 75.0000 mg | DELAYED_RELEASE_TABLET | Freq: Two times a day (BID) | ORAL | 2 refills | Status: DC

## 2018-12-08 NOTE — Progress Notes (Signed)
   Subjective:    Patient ID: Erica Graves, female    DOB: 1965/10/15, 53 y.o.   MRN: [id number]  HPI    Review of Systems  All other systems reviewed and are negative.      Objective:   Physical Exam        Assessment & Plan:

## 2018-12-08 NOTE — Patient Instructions (Signed)

## 2018-12-09 LAB — HEPATIC FUNCTION PANEL
AG Ratio: 1.5 (calc) (ref 1.0–2.5)
ALT: 18 U/L (ref 6–29)
AST: 24 U/L (ref 10–35)
Albumin: 4.5 g/dL (ref 3.6–5.1)
Alkaline phosphatase (APISO): 67 U/L (ref 37–153)
Bilirubin, Direct: 0.1 mg/dL (ref 0.0–0.2)
Globulin: 3 g/dL (calc) (ref 1.9–3.7)
Indirect Bilirubin: 0.2 mg/dL (calc) (ref 0.2–1.2)
Total Bilirubin: 0.3 mg/dL (ref 0.2–1.2)
Total Protein: 7.5 g/dL (ref 6.1–8.1)

## 2018-12-10 NOTE — Progress Notes (Signed)
Subjective:   Patient ID: Erica Graves, female   DOB: 53 y.o.   MRN: [id number]   HPI Patient presents stating that she is concerned about discoloration of 4 nails 2 on each foot and also she is had a lot of pain in the bottom of her left heel which is been present for around a month.  States that it made it hard for her to be active and it is worse when she gets up in the morning after periods of sitting.  Patient does not smoke likes to be active   Review of Systems  All other systems reviewed and are negative.       Objective:  Physical Exam Vitals signs and nursing note reviewed.  Constitutional:      Appearance: She is well-developed.  Pulmonary:     Effort: Pulmonary effort is normal.  Musculoskeletal: Normal range of motion.  Skin:    General: Skin is warm.  Neurological:     Mental Status: She is alert.     Neurovascular status found to be intact muscle strength was found to be adequate range of motion within normal limits.  Patient is found to have exquisite discomfort plantar aspect left heel at the insertional point of the tendon into the calcaneus with inflammation fluid buildup noted around the insertional point and patient is found to have good digital perfusion well oriented x3.  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  I went ahead and will start her on Lamisil we will get a get liver function studies and I educated her on this I went ahead today and I did sterile prep injected the plantar fascial 3 mg Kenalog 5 mg Xylocaine applied fascial brace gave instructions for stretching exercises shoe gear modifications and reappoint for [place] to  recheck  X-ray indicated small spur no indications of stress fracture arthritis

## 2018-12-22 ENCOUNTER — Ambulatory Visit: Payer: BC Managed Care – PPO | Admitting: Podiatry

## 2019-01-01 ENCOUNTER — Ambulatory Visit: Payer: BC Managed Care – PPO | Admitting: Podiatry

## 2019-02-02 ENCOUNTER — Ambulatory Visit: Payer: BC Managed Care – PPO

## 2019-02-02 ENCOUNTER — Ambulatory Visit: Payer: BC Managed Care – PPO | Admitting: Orthopedic Surgery

## 2019-02-02 ENCOUNTER — Other Ambulatory Visit: Payer: Self-pay

## 2019-02-02 VITALS — BP 156/97 | HR 70 | Temp 97.0°F | Ht 65.0 in | Wt 213.0 lb

## 2019-02-02 DIAGNOSIS — M1710 Unilateral primary osteoarthritis, unspecified knee: Secondary | ICD-10-CM

## 2019-02-02 DIAGNOSIS — M1711 Unilateral primary osteoarthritis, right knee: Secondary | ICD-10-CM

## 2019-02-02 DIAGNOSIS — M25561 Pain in right knee: Secondary | ICD-10-CM | POA: Diagnosis not present

## 2019-02-02 MED ORDER — MELOXICAM 7.5 MG PO TABS: 7.5000 mg | ORAL_TABLET | Freq: Every day | ORAL | 5 refills | Status: DC

## 2019-02-02 NOTE — Progress Notes (Signed)
Erica Graves  02/02/2019  Body mass index is 35.45 kg/m.   HISTORY SECTION :  Chief Complaint  Patient presents with  . Knee Pain    Right knee pain.   HPI The patient presents for evaluation of  (mild/moderate/severe/ ) MODERATE  pain, in the (right /left) right knee medial side for several months associated with weightbearing pain grinding popping catching  No prior treatment no trauma no injury Review of Systems  Constitutional:       She reported the review of systems is normal through questionnaire which was reviewed and include     has a past medical history of Carpal tunnel syndrome, bilateral, Chronic low back pain, Constipation, Counseling for estrogen replacement therapy (07/07/2014), Depression with anxiety, Dyspareunia (07/07/2014), History of kidney stones, Hot flashes (07/07/2014), Hypertension, Menopause (07/07/2014), Moody (07/07/2014), Obesity, and Umbilical hernia.   Past Surgical History:  Procedure Laterality Date  . ABDOMINAL HYSTERECTOMY    . BACK SURGERY     removal of cyst subdermal  . CARPAL TUNNEL RELEASE Left 09/30/2014   Procedure: CARPAL TUNNEL RELEASE;  Surgeon: Carole Civil, MD;  Location: [facility];  Service: Orthopedics;  Laterality: Left;  . CARPAL TUNNEL RELEASE Right 09/28/2015   Procedure: CARPAL TUNNEL RELEASE;  Surgeon: Carole Civil, MD;  Location: [facility];  Service: Orthopedics;  Laterality: Right;  . COLONOSCOPY N/A 06/29/2013   Procedure: COLONOSCOPY;  Surgeon: Danie Binder, MD;  [facility]: [facility];  Service: Endoscopy;  Laterality: N/A;  1:30  . KNEE ARTHROSCOPY Right   . KNEE ARTHROSCOPY  05/21/2011   Procedure: ARTHROSCOPY KNEE;  Surgeon: Carole Civil, MD;  Location: [facility];  Service: Orthopedics;  Laterality: Left;  lateral menisectomy  . KNEE CLOSED REDUCTION Left 05/26/2012   Procedure: CLOSED MANIPULATION KNEE;  Surgeon: Carole Civil, MD;  Location: [facility];  Service: Orthopedics;  Laterality: Left;  . left knee  arthroscopy  08/14/2005 Dr. Aline Brochure torn meniscus  . LUMBAR SPINE SURGERY    . PARTIAL HYSTERECTOMY    . TOTAL KNEE ARTHROPLASTY  03/31/2012   Procedure: TOTAL KNEE ARTHROPLASTY;  Surgeon: Carole Civil, MD;  Location: [facility];  Service: Orthopedics;  Laterality: Left;  Left Total Knee Arthroplasty  . TRIGGER FINGER RELEASE Right 01/24/2017   Procedure: RELEASE TRIGGER FINGER/A-1 PULLEY right thumb;  Surgeon: Carole Civil, MD;  Location: [facility];  Service: Orthopedics;  Laterality: Right;    Body mass index is 35.45 kg/m.   Allergies  Allergen Reactions  . Norco [Hydrocodone-Acetaminophen] Nausea And Vomiting     Current Outpatient Medications:  .  amLODipine (NORVASC) 5 MG tablet, TAKE 1 TABLET BY MOUTH EVERY DAY, Disp: 90 tablet, Rfl: 0 .  cholecalciferol (VITAMIN D) 1000 units tablet, Take 1,000 Units by mouth daily., Disp: , Rfl:  .  indapamide (LOZOL) 1.25 MG tablet, Take 1 tablet (1.25 mg total) by mouth daily., Disp: 90 tablet, Rfl: 1 .  diclofenac (VOLTAREN) 75 MG EC tablet, Take 1 tablet (75 mg total) by mouth 2 (two) times daily. (Patient not taking: Reported on 02/02/2019), Disp: 50 tablet, Rfl: 2 .  LORazepam (ATIVAN) 0.5 MG tablet, 1/2 to 1 tablet qhs prn sleep (Patient not taking: Reported on 02/02/2019), Disp: 30 tablet, Rfl: 1 .  methocarbamol (ROBAXIN) 500 MG tablet, Take 1 tablet (500 mg total) by mouth 2 (two) times daily as needed for muscle spasms. (Patient not taking: Reported on 02/02/2019), Disp: 20 tablet, Rfl: 0   PHYSICAL EXAM SECTION:  1) BP (!) 156/97   Pulse 70   Temp (!) 97 F (36.1 C)   Ht 5\' 5"  (1.651 m)   Wt 213 lb (96.6 kg)   BMI 35.45 kg/m   Body mass index is 35.45 kg/m. General appearance: Well-developed well-nourished no gross deformities  2) Cardiovascular normal pulse and perfusion in the LOWER  extremities normal color without edema  3) Neurologically deep tendon reflexes are equal and normal, no sensation loss or deficits no  pathologic reflexes  4) Psychological: Awake alert and oriented x3 mood and affect normal  5) Skin no lacerations or ulcerations no nodularity no palpable masses, no erythema or nodularity  6) Musculoskeletal:  RIGHT KNEE Tenderness medial joint line effusion none alignment normal Range of motion full Ligaments feel stable Muscle tone is normal strength in the quads is normal     MEDICAL DECISION SECTION:  Encounter Diagnosis  Name Primary?  . Right knee pain, unspecified chronicity Yes    Imaging Symmetric joint space narrowing with normal alignment there are some secondary bone changes in the patellofemoral joint  Plan:  (Rx., Inj., surg., Frx, MRI/CT, XR:2)  Meds ordered this encounter  Medications  . meloxicam (MOBIC) 7.5 MG tablet    Sig: Take 1 tablet (7.5 mg total) by mouth daily.    Dispense:  30 tablet    Refill:  5    F/U PRN   3:35 PM , MD  02/02/2019

## 2019-02-02 NOTE — Patient Instructions (Addendum)

## 2019-02-24 ENCOUNTER — Encounter: Payer: Self-pay | Admitting: Family Medicine

## 2019-02-24 LAB — BASIC METABOLIC PANEL
BUN/Creatinine Ratio: 21 (ref 9–23)
BUN: 16 mg/dL (ref 6–24)
CO2: 25 mmol/L (ref 20–29)
Calcium: 9.6 mg/dL (ref 8.7–10.2)
Chloride: 102 mmol/L (ref 96–106)
Creatinine, Ser: 0.77 mg/dL (ref 0.57–1.00)
GFR calc Af Amer: 101 mL/min/{1.73_m2} (ref >59)
GFR calc non Af Amer: 87 mL/min/{1.73_m2} (ref >59)
Glucose: 106 mg/dL — ABNORMAL HIGH (ref 65–99)
Potassium: 3.7 mmol/L (ref 3.5–5.2)
Sodium: 142 mmol/L (ref 134–144)

## 2019-03-16 ENCOUNTER — Ambulatory Visit (INDEPENDENT_AMBULATORY_CARE_PROVIDER_SITE_OTHER): Payer: BC Managed Care – PPO | Admitting: Family Medicine

## 2019-03-16 DIAGNOSIS — M25561 Pain in right knee: Secondary | ICD-10-CM

## 2019-03-16 DIAGNOSIS — M1710 Unilateral primary osteoarthritis, unspecified knee: Secondary | ICD-10-CM

## 2019-03-16 MED ORDER — MELOXICAM 15 MG PO TABS: 15.0000 mg | ORAL_TABLET | Freq: Every day | ORAL | 3 refills | Status: DC

## 2019-03-16 MED ORDER — INDAPAMIDE 1.25 MG PO TABS: 1.2500 mg | ORAL_TABLET | Freq: Every day | ORAL | 1 refills | Status: DC

## 2019-03-16 MED ORDER — AMLODIPINE BESYLATE 5 MG PO TABS: 5.0000 mg | ORAL_TABLET | Freq: Every day | ORAL | 1 refills | Status: DC

## 2019-03-16 NOTE — Progress Notes (Signed)
   Subjective:    Patient ID: Erica Graves, female    DOB: [birth year], 54 y.o.   MRN: [id number]  Shoulder Pain  The pain is present in the right shoulder, neck and right arm. This is a new problem. Episode onset: one week. There has been no history of extremity trauma. The quality of the pain is described as burning. Pertinent negatives include no fever. She has tried acetaminophen and NSAIDS for the symptoms.  Patient denies any injury.  Denies any weakness of her arms.  Has had carpal tunnel surgery in the past. Patient also has high blood pressure states is been under good control she takes her medicine on a regular basis minimizes salt in her diet Virtual Visit via Video Note  I connected with Erica Graves on 03/16/19 at  1:10 PM EST by a video enabled telemedicine application and verified that I am speaking with the correct person using two identifiers.  Location: Patient: home Provider: office   I discussed the limitations of evaluation and management by telemedicine and the availability of in person appointments. The patient expressed understanding and agreed to proceed.  History of Present Illness:    Observations/Objective:   Assessment and Plan:   Follow Up Instructions:    I discussed the assessment and treatment plan with the patient. The patient was provided an opportunity to ask questions and all were answered. The patient agreed with the plan and demonstrated an understanding of the instructions.   The patient was advised to call back or seek an in-person evaluation if the symptoms worsen or if the condition fails to improve as anticipated.  I provided 17 minutes of non-face-to-face time during this encounter.     Shoulder pain as per above  Review of Systems  Constitutional: Negative for activity change, fatigue and fever.  HENT: Negative for congestion and rhinorrhea.   Respiratory: Negative for cough, chest tightness and shortness of breath.     Cardiovascular: Negative for chest pain and leg swelling.  Gastrointestinal: Negative for abdominal pain and nausea.  Skin: Negative for color change.  Neurological: Negative for dizziness and headaches.  Psychiatric/Behavioral: Negative for agitation and behavioral problems.       Objective:   Physical Exam  Patient had virtual visit Appears to be in no distress Atraumatic Neuro able to relate and oriented No apparent resp distress Color normal       Assessment & Plan:  Right shoulder left arm discomfort some numbness Patient is already had carpal tunnel surgery in the past This could be an impingement in the right shoulder but could also be an impingement of the cervical spine Patient was encouraged to do exercises Meloxicam 15 mg daily over the next couple weeks Give [place] an update in 2 to 3 weeks if not doing better will need to come in for directed visit Hold off on any type of imaging of the shoulder and cervical spine at this point If progressive troubles or weakness follow-up immediately  HTN decent control currently continue current medications watch diet closely refills given lab work office visit later in the spring

## 2019-05-26 ENCOUNTER — Telehealth: Payer: Self-pay | Admitting: Adult Health

## 2019-05-26 NOTE — Telephone Encounter (Signed)

## 2019-05-27 ENCOUNTER — Ambulatory Visit: Payer: BC Managed Care – PPO | Admitting: Orthopedic Surgery

## 2019-05-27 ENCOUNTER — Encounter: Payer: Self-pay | Admitting: Orthopedic Surgery

## 2019-05-27 NOTE — Progress Notes (Deleted)
No chief complaint on file.   Encounter Diagnosis  Name Primary?  . Chronic pain of right knee Yes    Patient requests an injection  The patient has chronic pain in her right knee x-rays show degenerative arthritis patient does not want to have surgery at this time would like alternative treatment and injection is reasonable.  Procedure note right knee injection   verbal consent was obtained to inject right knee joint  Timeout was completed to confirm the site of injection  The medications used were 40 mg of Depo-Medrol and 1% lidocaine 3 cc  Anesthesia was provided by ethyl chloride and the skin was prepped with alcohol.  After cleaning the skin with alcohol a 20-gauge needle was used to inject the right knee joint. There were no complications. A sterile bandage was applied.

## 2019-05-28 ENCOUNTER — Other Ambulatory Visit: Payer: BC Managed Care – PPO | Admitting: Adult Health

## 2019-06-04 ENCOUNTER — Telehealth: Payer: Self-pay | Admitting: Adult Health

## 2019-06-04 NOTE — Telephone Encounter (Signed)

## 2019-06-05 ENCOUNTER — Other Ambulatory Visit: Payer: Self-pay

## 2019-06-05 ENCOUNTER — Ambulatory Visit (INDEPENDENT_AMBULATORY_CARE_PROVIDER_SITE_OTHER): Payer: BC Managed Care – PPO | Admitting: Adult Health

## 2019-06-05 ENCOUNTER — Encounter: Payer: Self-pay | Admitting: Adult Health

## 2019-06-05 VITALS — BP 123/78 | HR 66 | Ht 65.0 in | Wt 208.0 lb

## 2019-06-05 DIAGNOSIS — Z13220 Encounter for screening for lipoid disorders: Secondary | ICD-10-CM | POA: Insufficient documentation

## 2019-06-05 DIAGNOSIS — Z1212 Encounter for screening for malignant neoplasm of rectum: Secondary | ICD-10-CM

## 2019-06-05 DIAGNOSIS — Z01419 Encounter for gynecological examination (general) (routine) without abnormal findings: Secondary | ICD-10-CM | POA: Diagnosis not present

## 2019-06-05 DIAGNOSIS — N898 Other specified noninflammatory disorders of vagina: Secondary | ICD-10-CM

## 2019-06-05 DIAGNOSIS — Z90710 Acquired absence of both cervix and uterus: Secondary | ICD-10-CM

## 2019-06-05 DIAGNOSIS — Z1211 Encounter for screening for malignant neoplasm of colon: Secondary | ICD-10-CM

## 2019-06-05 LAB — HEMOCCULT GUIAC POC 1CARD (OFFICE): Fecal Occult Blood, POC: NEGATIVE

## 2019-06-05 NOTE — Progress Notes (Signed)
Patient ID: Erica Graves, female   DOB: Jun 09, 1965, 54 y.o.   MRN: [id number] History of Present Illness: Erica Graves is a 54 year old black female,married sp hysterectomy in 2003, in for well woman gyn exam. PCP is Dr Lilyan Punt.    Current Medications, Allergies, Past Medical History, Past Surgical History, Family History and Social History were reviewed in [employer] record.     Review of Systems: Patient denies any headaches, hearing loss, fatigue, blurred vision, shortness of breath, chest pain, abdominal pain, problems with bowel movements,)hx constipation) urination, or intercourse. No mood swings. Has pain in knees +vaginal dryness and some itching     Physical Exam:BP 123/78 (BP Location: Right Arm, Patient Position: Sitting, Cuff Size: Normal)   Pulse 66   Ht 5\' 5"  (1.651 m)   Wt 208 lb (94.3 kg)   BMI 34.61 kg/m  General:  Well developed, well nourished, no acute distress Skin:  Warm and dry Neck:  Midline trachea, normal thyroid, good ROM, no lymphadenopathy Lungs; Clear to auscultation bilaterally Breast:  No dominant palpable mass, retraction, or nipple discharge Cardiovascular: Regular rate and rhythm Abdomen:  Soft, non tender, no hepatosplenomegaly,has small umbilical hernia Pelvic:  External genitalia is normal in appearance,has healing hair bump right inner thigh.  The vagina is normal in appearance. Urethra has no lesions or masses. The cervix and uterus are absent.  No adnexal masses or tenderness noted.Bladder is non tender, no masses felt. Rectal: Good sphincter tone, no polyps, or hemorrhoids felt.  Hemoccult negative. Extremities/musculoskeletal:  No swelling or varicosities noted, no clubbing or cyanosis Psych:  No mood changes, alert and cooperative,seems happy AA is 1 PHQ 9 score is 2. Examination chaperoned by LPN  Impression and Plan: 1. Women's annual routine gynecological examination Do not use shower gel or  lotion in groin area Physical with PCP Labs with PCP Mammogram yearly  Follow up prn here   2. Screening for colorectal cancer Colonoscopy per GI  3. Vaginal dryness Try luvena for vaginal moisture and use astro glide with sex,put on self and partner Can use coconut oil too, if no allergy   4. Vaginal itching Try Faith Rogue

## 2019-07-12 ENCOUNTER — Ambulatory Visit
Admission: EM | Admit: 2019-07-12 | Discharge: 2019-07-12 | Disposition: A | Discharge status: Home / Self Care | Payer: BC Managed Care – PPO | Attending: Emergency Medicine | Admitting: Emergency Medicine

## 2019-07-12 DIAGNOSIS — H8111 Benign paroxysmal vertigo, right ear: Secondary | ICD-10-CM

## 2019-07-12 DIAGNOSIS — R42 Dizziness and giddiness: Secondary | ICD-10-CM

## 2019-07-12 DIAGNOSIS — H9311 Tinnitus, right ear: Secondary | ICD-10-CM

## 2019-07-12 MED ORDER — MECLIZINE HCL 25 MG PO TABS: 25.0000 mg | ORAL_TABLET | Freq: Three times a day (TID) | ORAL | 0 refills | Status: DC | PRN

## 2019-07-12 MED ORDER — PREDNISONE 10 MG (21) PO TBPK: ORAL_TABLET | Freq: Every day | ORAL | 0 refills | Status: DC

## 2019-07-12 NOTE — ED Provider Notes (Signed)
[facility]   [id number] 07/12/19 Arrival Time: 1219  CC: DIZZINESS  SUBJECTIVE:  Erica Graves is a 54 y.o. female who presents with complaint of dizziness and tinnitus RT ear that began 3 days ago.  Denies a precipitating event, trauma, or recent URI within the past month.  Describes the dizziness as "being on a boat." States that it is intermittent with episodes lasting 10 minutes.  Has tried advil without relief.  Denies aggravating factors, or association with sudden position changes.  Denies to previous symptoms in the past.  Denies fever, chills, nausea, vomiting, hearing changes, ear pain, chest pain, syncope, SOB, weakness, slurred speech, memory or emotional changes, facial drooping/ asymmetry, incoordination, numbness or tingling, abdominal pain, changes in bowel or bladder habits.    ROS: As per HPI.  All other pertinent ROS negative.    Past Medical History:  Diagnosis Date  . Carpal tunnel syndrome, bilateral   . Chronic low back pain   . Constipation   . Counseling for estrogen replacement therapy 07/07/2014  . Depression with anxiety   . Dyspareunia 07/07/2014  . History of kidney stones   . Hot flashes 07/07/2014  . Hypertension   . Menopause 07/07/2014  . Moody 07/07/2014  . Obesity   . Umbilical hernia    Past Surgical History:  Procedure Laterality Date  . ABDOMINAL HYSTERECTOMY    . BACK SURGERY     removal of cyst subdermal  . CARPAL TUNNEL RELEASE Left 09/30/2014   Procedure: CARPAL TUNNEL RELEASE;  Surgeon: Vickki Hearing, MD;  Location: [facility];  Service: Orthopedics;  Laterality: Left;  . CARPAL TUNNEL RELEASE Right 09/28/2015   Procedure: CARPAL TUNNEL RELEASE;  Surgeon: Vickki Hearing, MD;  Location: [facility];  Service: Orthopedics;  Laterality: Right;  . COLONOSCOPY N/A 06/29/2013   Procedure: COLONOSCOPY;  Surgeon: West Bali, MD;  Location: [facility];  Service: Endoscopy;  Laterality: N/A;  1:30  . KNEE ARTHROSCOPY Right   . KNEE  ARTHROSCOPY  05/21/2011   Procedure: ARTHROSCOPY KNEE;  Surgeon: Vickki Hearing, MD;  Location: [facility];  Service: Orthopedics;  Laterality: Left;  lateral menisectomy  . KNEE CLOSED REDUCTION Left 05/26/2012   Procedure: CLOSED MANIPULATION KNEE;  Surgeon: Vickki Hearing, MD;  Location: [facility];  Service: Orthopedics;  Laterality: Left;  . left knee arthroscopy  08/14/2005 Dr. Romeo Apple torn meniscus  . LUMBAR SPINE SURGERY    . PARTIAL HYSTERECTOMY    . TOTAL KNEE ARTHROPLASTY  03/31/2012   Procedure: TOTAL KNEE ARTHROPLASTY;  Surgeon: Vickki Hearing, MD;  Location: [facility];  Service: Orthopedics;  Laterality: Left;  Left Total Knee Arthroplasty  . TRIGGER FINGER RELEASE Right 01/24/2017   Procedure: RELEASE TRIGGER FINGER/A-1 PULLEY right thumb;  Surgeon: Vickki Hearing, MD;  Location: [facility];  Service: Orthopedics;  Laterality: Right;   Allergies  Allergen Reactions  . Norco [Hydrocodone-Acetaminophen] Nausea And Vomiting   No current facility-administered medications on file prior to encounter.   Current Outpatient Medications on File Prior to Encounter  Medication Sig Dispense Refill  . amLODipine (NORVASC) 5 MG tablet Take 1 tablet (5 mg total) by mouth daily. 90 tablet 1  . cholecalciferol (VITAMIN D) 1000 units tablet Take 1,000 Units by mouth daily.    . indapamide (LOZOL) 1.25 MG tablet Take 1 tablet (1.25 mg total) by mouth daily. 90 tablet 1   Social History   Socioeconomic History  . Marital status: Married  Spouse name: Not on file  . Number of children: 3  . Years of education: college  . Highest education level: Not on file  Occupational History  . 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 Smoking status: Never Smoker  . Smokeless tobacco: Never Used  Substance and Sexual Activity  . Alcohol use: Yes    Comment: occ  . Drug use:  No  . Sexual activity: Yes    Birth control/protection: Surgical    Comment: hyst  Other Topics Concern  . Not on file  Social History Narrative  . Not on file   Social Determinants of Health   Financial Resource Strain: Low Risk   . Difficulty of Paying Living Expenses: Not hard at all  Food Insecurity: No Food Insecurity  . Worried About Charity fundraiser in the Last Year: Never true  . Ran Out of Food in the Last Year: Never true  Transportation Needs: No Transportation Needs  . Lack of Transportation (Medical): No  . Lack of Transportation (Non-Medical): No  Physical Activity: Inactive  . Days of Exercise per Week: 1 day  . Minutes of Exercise per Session: 0 min  Stress: Stress Concern Present  . Feeling of Stress : To some extent  Social Connections: Not Isolated  . Frequency of Communication with Friends and Family: Twice a week  . Frequency of Social Gatherings with Friends and Family: Once a week  . Attends Religious Services: More than 4 times per year  . Active Member of Clubs or Organizations: Yes  . Attends Archivist Meetings: More than 4 times per year  . Marital Status: Married  Human resources officer Violence: Not At Risk  . Fear of Current or Ex-Partner: No  . Emotionally Abused: No  . Physically Abused: No  . Sexually Abused: No   Family History  Problem Relation Age of Onset  . Heart disease Mother   . Hypertension Mother   . Colon cancer Neg Hx     OBJECTIVE:  Vitals:   07/12/19 1233  BP: 126/82  Pulse: 64  Resp: 18  Temp: 98.2 F (36.8 C)  SpO2: 95%    General appearance: alert; no distress Eyes: PERRLA; EOMI; conjunctiva normal HENT: normocephalic; atraumatic; TMs normal, bubbles behind RT TM; nasal mucosa normal; oral mucosa normal Neck: supple with FROM Lungs: clear to auscultation bilaterally Heart: regular rate and rhythm Extremities: no cyanosis or edema; symmetrical with no gross deformities Skin: warm and  dry Neurologic: normal gait; strength and sensation intact about the upper and lower extremitis; CN 2-12 grossly intact; negative pronator drift; finger to nose without difficulty Psychological: alert and cooperative; normal mood and affect   ASSESSMENT & PLAN:  1. Tinnitus of right ear   2. Dizziness and giddiness   3. Benign paroxysmal positional vertigo of right ear     Meds ordered this encounter  Medications  . predniSONE (STERAPRED UNI-PAK 21 TAB) 10 MG (21) TBPK tablet    Sig: Take by mouth daily. Take 6 tabs by mouth daily  for 2 days, then 5 tabs for 2 days, then 4 tabs for 2 days, then 3 tabs for 2 days, 2 tabs for 2 days, then 1 tab by mouth daily for 2 days    Dispense:  42 tablet    Refill:  0    Order Specific  Question:   Supervising Provider    Answer:   Eustace Moore [[id number]]  . meclizine (ANTIVERT) 25 MG tablet    Sig: Take 1 tablet (25 mg total) by mouth 3 (three) times daily as needed for dizziness.    Dispense:  30 tablet    Refill:  0    Order Specific Question:   Supervising Provider    Answer:   Eustace Moore [[id number]]   Prednisone prescribed.  Take as directed and to completion Meclizine prescribed.  Take as directed for symptomatic relief.  This medication may make you drowsy so use with cautions while driving or operating heavy machinery. Follow up with PCP for recheck and to ensure symptoms are improving Return, go to the ER, or call 911 if you have any new or worsening symptoms fever, nausea, vomiting, weakness, slurred speech, facial droop, feeling like you are going to pass out, worsening symptoms despite treatment, etc...  Reviewed expectations re: course of current medical issues. Questions answered. Outlined signs and symptoms indicating need for more acute intervention. Patient verbalized understanding. After Visit Summary given.    Rennis Harding, PA-C 07/12/19 1326

## 2019-07-12 NOTE — ED Triage Notes (Signed)
Pt presents with c/o ringing in right ear that began about 3 days ago, also has some dizziness

## 2019-07-12 NOTE — Discharge Instructions (Signed)
Prednisone prescribed.  Take as directed and to completion Meclizine prescribed.  Take as directed for symptomatic relief.  This medication may make you drowsy so use with cautions while driving or operating heavy machinery. Follow up with PCP for recheck and to ensure symptoms are improving Return, go to the ER, or call 911 if you have any new or worsening symptoms fever, nausea, vomiting, weakness, slurred speech, facial droop, feeling like you are going to pass out, worsening symptoms despite treatment, etc..Marland Kitchen

## 2019-09-15 ENCOUNTER — Ambulatory Visit: Payer: BC Managed Care – PPO | Admitting: Orthopedic Surgery

## 2019-10-08 ENCOUNTER — Other Ambulatory Visit: Payer: Self-pay

## 2019-10-08 ENCOUNTER — Ambulatory Visit: Payer: BC Managed Care – PPO | Admitting: Orthopedic Surgery

## 2019-10-08 ENCOUNTER — Encounter: Payer: Self-pay | Admitting: Orthopedic Surgery

## 2019-10-08 VITALS — BP 165/84 | HR 60 | Ht 65.0 in | Wt 205.0 lb

## 2019-10-08 DIAGNOSIS — G8929 Other chronic pain: Secondary | ICD-10-CM | POA: Diagnosis not present

## 2019-10-08 DIAGNOSIS — M25561 Pain in right knee: Secondary | ICD-10-CM

## 2019-10-08 MED ORDER — MELOXICAM 7.5 MG PO TABS: 7.5000 mg | ORAL_TABLET | Freq: Two times a day (BID) | ORAL | 5 refills | Status: DC

## 2019-10-08 NOTE — Patient Instructions (Addendum)
Vitamin B 6 100mg  twice daily Wear splints at night You have received an injection of steroids into the joint. 15% of patients will have increased pain within the 24 hours postinjection.   This is transient and will go away.   We recommend that you use ice packs on the injection site for 20 minutes every 2 hours and extra strength Tylenol 2 tablets every 8 as needed until the pain resolves.  If you continue to have pain after taking the Tylenol and using the ice please call the office for further instructions.

## 2019-10-08 NOTE — Progress Notes (Signed)
BP (!) 165/84   Pulse 60   Ht 5\' 5"  (1.651 m)   Wt 205 lb (93 kg)   BMI 34.11 kg/m   Chief Complaint  Patient presents with  . Knee Pain    right knee pain    Status post left total knee replacement currently being seen for chronic right knee pain from osteoarthritis  She was placed on meloxicam and got some initial relief but says is not working as well now.  Complains of medial joint line pain swelling stiffness difficulty walking at times  Review of systems negative for chest pain or shortness of breath  BP (!) 165/84   Pulse 60   Ht 5\' 5"  (1.651 m)   Wt 205 lb (93 kg)   BMI 34.11 kg/m   She is awake alert and oriented x3 mood and affect are normal she is ambulatory without any assistive devices  The right knee is tender over the medial joint line there is no effusion the skin is intact and normal  Strength and stability are normal muscle tone is excellent  X-rays Last done on December 2020 showed osteoarthritis medial compartment  Recommend increase meloxicam to twice daily  Procedure note right knee injection   verbal consent was obtained to inject right knee joint  Timeout was completed to confirm the site of injection  The medications used were 40 mg of Depo-Medrol and 1% lidocaine 3 cc  Anesthesia was provided by ethyl chloride and the skin was prepped with alcohol.  After cleaning the skin with alcohol a 20-gauge needle was used to inject the right knee joint. There were no complications. A sterile bandage was applied.  Come back in 6 weeks if no improvement consider hyaluronic acid which was discussed as well   Encounter Diagnosis  Name Primary?  . Chronic pain of right knee Yes   Chr/inj

## 2019-10-09 ENCOUNTER — Telehealth: Payer: Self-pay | Admitting: Radiology

## 2019-10-09 ENCOUNTER — Other Ambulatory Visit: Payer: Self-pay | Admitting: Orthopedic Surgery

## 2019-10-09 MED ORDER — DICLOFENAC SODIUM 75 MG PO TBEC: 75.0000 mg | DELAYED_RELEASE_TABLET | Freq: Two times a day (BID) | ORAL | 2 refills | Status: DC

## 2019-10-09 NOTE — Telephone Encounter (Signed)
Insurance will not cover Meloxicam preffered drugs are Diclofenac and Etodolac

## 2019-10-13 ENCOUNTER — Ambulatory Visit: Payer: BC Managed Care – PPO | Admitting: Orthopedic Surgery

## 2019-10-28 ENCOUNTER — Ambulatory Visit: Payer: Self-pay | Admitting: Orthopedic Surgery

## 2019-11-19 ENCOUNTER — Encounter: Payer: Self-pay | Admitting: Orthopedic Surgery

## 2019-11-19 ENCOUNTER — Other Ambulatory Visit: Payer: Self-pay

## 2019-11-19 ENCOUNTER — Ambulatory Visit: Payer: BC Managed Care – PPO | Admitting: Orthopedic Surgery

## 2019-11-19 ENCOUNTER — Ambulatory Visit: Payer: BC Managed Care – PPO

## 2019-11-19 VITALS — BP 159/96 | HR 59 | Ht 65.0 in | Wt 204.0 lb

## 2019-11-19 DIAGNOSIS — M1710 Unilateral primary osteoarthritis, unspecified knee: Secondary | ICD-10-CM | POA: Diagnosis not present

## 2019-11-19 DIAGNOSIS — Z96652 Presence of left artificial knee joint: Secondary | ICD-10-CM | POA: Diagnosis not present

## 2019-11-19 NOTE — Progress Notes (Signed)
Chief Complaint  Patient presents with  . Knee Pain    left knee, is better, right knee is worse.    Left tka 2014  Encounter Diagnoses  Name Primary?  . Primary localized osteoarthritis of knee  right Yes  . 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

## 2019-11-19 NOTE — Patient Instructions (Signed)
meloxicam   Ok to take 2 on a bad day

## 2019-12-28 ENCOUNTER — Encounter: Payer: Self-pay | Admitting: Orthopedic Surgery

## 2019-12-28 ENCOUNTER — Ambulatory Visit: Payer: BC Managed Care – PPO

## 2019-12-28 ENCOUNTER — Other Ambulatory Visit: Payer: Self-pay

## 2019-12-28 ENCOUNTER — Ambulatory Visit: Payer: BC Managed Care – PPO | Admitting: Orthopedic Surgery

## 2019-12-28 VITALS — BP 147/85 | HR 65 | Ht 65.0 in | Wt 206.0 lb

## 2019-12-28 DIAGNOSIS — M25531 Pain in right wrist: Secondary | ICD-10-CM | POA: Diagnosis not present

## 2019-12-28 DIAGNOSIS — M654 Radial styloid tenosynovitis [de Quervain]: Secondary | ICD-10-CM

## 2019-12-28 NOTE — Progress Notes (Signed)
Chief Complaint  Patient presents with   Hand Pain    pain base of right  thumb     54 YO FEMALE 1 MO H/O RIGHT WRIST PAIN AND GIVING WAY   Review of Systems  Skin: Negative.   Neurological: Negative for sensory change.   Past Medical History:  Diagnosis Date   Carpal tunnel syndrome, bilateral    Chronic low back pain    Constipation    Counseling for estrogen replacement therapy 07/07/2014   Depression with anxiety    Dyspareunia 07/07/2014   History of kidney stones    Hot flashes 07/07/2014   Hypertension    Menopause 07/07/2014   [facility]   Obesity    Umbilical hernia    BP (!) 147/85    Pulse 65    Ht 5\' 5"  (1.651 m)    Wt 206 lb (93.4 kg)    BMI 34.28 kg/m   RIGHT THUMB : Tenderness over the first extensor compartment and somewhat over the [facility] joint.  Range of motion is normal but painful de Quervain's neurovascular Sam otherwise intact  X-ray was taken [facility] joint arthritis mild to moderate  Injection performed at the first extensor compartment  Procedure note injection RIGHT wrist for de Quervain's syndrome  The patient has consented for injection of THE RIGHT wrist for de Quervain's syndrome 40 mg of Depo-Medrol 1 mL, 3 mL 1% lidocaine. Patient gave verbal consent timeout to confirm site of injection  Sterile technique ethyl chloride used for skin prep  No complications

## 2019-12-28 NOTE — Patient Instructions (Addendum)
You have received an injection of steroids into the joint. 15% of patients will have increased pain within the 24 hours postinjection.   This is transient and will go away.   We recommend that you use ice packs on the injection site for 20 minutes every 2 hours and extra strength Tylenol 2 tablets every 8 as needed until the pain resolves.  If you continue to have pain after taking the Tylenol and using the ice please call the office for further instructions.   De Quervain's Tenosynovitis  De Quervain's tenosynovitis is a condition that causes inflammation of the tendon on the thumb side of the wrist. Tendons are cords of tissue that connect bones to muscles. The tendons in the hand pass through a tunnel called a sheath. A slippery layer of tissue (synovium) lets the tendons move smoothly in the sheath. With de Quervain's tenosynovitis, the sheath swells or thickens, causing friction and pain. The condition is also called de Quervain's disease and de Quervain's syndrome. It occurs most often in women who are 30-50 years old. What are the causes? The exact cause of this condition is not known. It may be associated with overuse of the hand and wrist. What increases the risk? You are more likely to develop this condition if you:  Use your hands far more than normal, especially if you repeat certain movements that involve twisting your hand or using a tight grip.  Are pregnant.  Are a middle-aged woman.  Have rheumatoid arthritis.  Have diabetes. What are the signs or symptoms? The main symptom of this condition is pain on the thumb side of the wrist. The pain may get worse when you grasp something or turn your wrist. Other symptoms may include:  Pain that extends up the forearm.  Swelling of your wrist and hand.  Trouble moving the thumb and wrist.  A sensation of snapping in the wrist.  A bump filled with fluid (cyst) in the area of the pain. How is this diagnosed? This  condition may be diagnosed based on:  Your symptoms and medical history.  A physical exam. During the exam, your health care provider may do a simple test (Finkelstein test) that involves pulling your thumb and wrist to see if this causes pain. You may also need to have an X-ray. How is this treated? Treatment for this condition may include:  Avoiding any activity that causes pain and swelling.  Taking medicines. Anti-inflammatory medicines and corticosteroid injections may be used to reduce inflammation and relieve pain.  Wearing a splint.  Having surgery. This may be needed if other treatments do not work. Once the pain and swelling has gone down:  Physical therapy. This includes stretching and strengthening exercises.  Occupational therapy. This includes adjusting how you move your wrist. Follow these instructions at home: If you have a splint:  Wear the splint as told by your health care provider. Remove it only as told by your health care provider.  Loosen the splint if your fingers tingle, become numb, or turn cold and blue.  Keep the splint clean.  If the splint is not waterproof: ? Do not let it get wet. ? Cover it with a watertight covering when you take a bath or a shower. Managing pain, stiffness, and swelling   Avoid movements and activities that cause pain and swelling in the wrist area.  If directed, put ice on the painful area. This may be helpful after doing activities that involve the sore wrist. ?   Put ice in a plastic bag. ? Place a towel between your skin and the bag. ? Leave the ice on for 20 minutes, 2-3 times a day.  Move your fingers often to avoid stiffness and to lessen swelling.  Raise (elevate) the injured area above the level of your heart while you are sitting or lying down. General instructions  Return to your normal activities as told by your health care provider. Ask your health care provider what activities are safe for you.  Take  over-the-counter and prescription medicines only as told by your health care provider.  Keep all follow-up visits as told by your health care provider. This is important. Contact a health care provider if:  Your pain medicine does not help.  Your pain gets worse.  You develop new symptoms. Summary  De Quervain's tenosynovitis is a condition that causes inflammation of the tendon on the thumb side of the wrist.  The condition occurs most often in women who are 30-50 years old.  The exact cause of this condition is not known. It may be associated with overuse of the hand and wrist.  Treatment starts with avoiding activity that causes pain or swelling in the wrist area. Other treatment may include wearing a splint and taking medicine. Sometimes, surgery is needed. This information is not intended to replace advice given to you by your health care provider. Make sure you discuss any questions you have with your health care provider. Document Revised: 08/15/2017 Document Reviewed: 01/21/2017 Elsevier Patient Education  2020 [employer]  

## 2020-02-08 ENCOUNTER — Ambulatory Visit: Payer: BC Managed Care – PPO | Admitting: Orthopedic Surgery

## 2020-02-29 ENCOUNTER — Ambulatory Visit: Payer: BC Managed Care – PPO | Admitting: Orthopedic Surgery

## 2020-03-10 ENCOUNTER — Ambulatory Visit: Payer: BC Managed Care – PPO | Admitting: Orthopedic Surgery

## 2020-03-10 ENCOUNTER — Encounter: Payer: Self-pay | Admitting: Orthopedic Surgery

## 2020-03-10 ENCOUNTER — Other Ambulatory Visit: Payer: Self-pay

## 2020-03-10 VITALS — BP 155/94 | HR 68 | Ht 65.0 in

## 2020-03-10 DIAGNOSIS — M654 Radial styloid tenosynovitis [de Quervain]: Secondary | ICD-10-CM

## 2020-03-10 DIAGNOSIS — M659 Synovitis and tenosynovitis, unspecified: Secondary | ICD-10-CM | POA: Diagnosis not present

## 2020-03-10 DIAGNOSIS — M7051 Other bursitis of knee, right knee: Secondary | ICD-10-CM

## 2020-03-10 MED ORDER — PREDNISONE 10 MG PO TABS: 10.0000 mg | ORAL_TABLET | Freq: Three times a day (TID) | ORAL | 0 refills | Status: DC

## 2020-03-10 NOTE — Patient Instructions (Signed)
Pes Anserine Bursitis  The pes anserine is an area on the inside of your knee, just below the joint, that is cushioned by a fluid-filled sac (bursa). Pes anserine bursitis is a condition that happens when the bursa gets swollen and irritated. The condition causes knee pain. What are the causes? This condition may be caused by:  Making the same movement over and over.  A direct hit (trauma) to the inside of the leg. What increases the risk? You are more likely to develop this condition if you:  Are a runner.  Play sports that involve a lot of running and quick side-to-side movements (cutting).  Are an athlete who plays contact sports.  Swim using an inward angle of the knee, such as with the breaststroke.  Have tight hamstring muscles.  Are a woman.  Are overweight.  Have flat feet.  Have diabetes or osteoarthritis. What are the signs or symptoms? Symptoms of this condition include:  Knee pain that gets better with rest and worse with activities like climbing stairs, walking, running, or getting in and out of a chair.  Swelling.  Warmth.  Tenderness when pressing at the inside of the lower leg, just below the knee. How is this diagnosed? This condition may be diagnosed based on:  Your symptoms.  Your medical history.  A physical exam. ? During your physical exam, your health care provider will press on the tendon attachment to see if you feel pain. ? Your health care provider may also check your hip and knee motion and strength.  Tests to check for swelling and fluid buildup in the bursa and to look at muscles, bones, and tendons. These tests might include: ? X-rays. ? MRI. ? Ultrasound. How is this treated? This condition may be treated by:  Resting your knee. You may be told to raise (elevate) your knee while resting.  Avoiding activities that cause pain.  Icing the inside of your knee.  Sleeping with a pillow between your knees. This will cushion your  injured knee.  Taking medicine by mouth (orally) to reduce pain and swelling or having medicine injected into your knee.  Doing strengthening and stretching exercises (physical therapy). If these treatments do not work or if the condition keeps coming back, you may need to have surgery to remove the bursa. Follow these instructions at home: Managing pain, stiffness, and swelling  If directed, put ice on the injured area. ? Put ice in a plastic bag. ? Place a towel between your skin and the bag. ? Leave the ice on for 20 minutes, 2-3 times a day.  Elevate the injured area above the level of your heart while you are sitting or lying down.   Activity  Return to your normal activities as told by your health care provider. Ask your health care provider what activities are safe for you.  Do exercises as told by your health care provider. General instructions  Take over-the-counter and prescription medicines only as told by your health care provider.  Sleep with a pillow between your knees.  Do not use any products that contain nicotine or tobacco, such as cigarettes, e-cigarettes, and chewing tobacco. These can delay healing. If you need help quitting, ask your health care provider.  If you are overweight, work with your health care provider and a dietitian to set a weight-loss goal that is healthy and reasonable for you.  Keep all follow-up visits as told by your health care provider. This is important. How is this   prevented?  When exercising, make sure that you: ? Warm up and stretch before being active. ? Cool down and stretch after being active. ? Give your body time to rest between periods of activity. ? Use equipment that fits you. ? Are safe and responsible while being active to avoid falls. ? Do at least 150 minutes of moderate-intensity exercise each week, such as brisk walking or water aerobics. ? Maintain physical fitness,  including:  Strength.  Flexibility.  Cardiovascular fitness.  Endurance. ? Maintain a healthy weight. Contact a health care provider if:  Your symptoms do not improve.  Your symptoms get worse. Summary  Pes anserine bursitis is a condition that happens when the fluid-filled sac (bursa) at the inside of your knee gets swollen and irritated. The condition causes knee pain.  Treatment for pes anserine bursitis may include resting your knee, icing the inside of your knee, sleeping with a pillow between your knees, taking medicine by mouth or by injection, and doing strengthening and stretching exercises (physical therapy).  Follow instructions for managing pain, stiffness, and swelling.  Take over-the-counter and prescription medicines only as told by your health care provider. This information is not intended to replace advice given to you by your health care provider. Make sure you discuss any questions you have with your health care provider. Document Revised: 06/05/2018 Document Reviewed: 07/24/2017 Elsevier Patient Education  [place].

## 2020-03-10 NOTE — Progress Notes (Signed)
Chief Complaint  Patient presents with  . Hand Pain    Erica Graves is 55 years old we are following her for de Quervain's syndrome of the right wrist she also has [facility] arthritis of the right [facility] joint and today complains of swelling of her left total knee and pain on the medial side of her right knee  Left total knee x-ray was done a few months back knee look great alignment was good no signs of loosening or osteolysis  Swelling seems to be intermittent seems to be associated with some anterior knee pain  Exam is unremarkable knee is stable arc of motion is 0-1 20 no warmth or joint effusion is noted  Right thumb pain has improved she still has a positive Finkelstein's test and tenderness over the first extensor compartment  Right knee tenderness over the pes bursa with swelling and pain to palpation remaining his knee exam is normal  Encounter Diagnoses  Name Primary?  . Synovitis of knee left Yes  . De Quervain's disease (radial styloid tenosynovitis)   . Pes anserinus bursitis of right knee     We injected the pes bursa of the right knee Procedure note right knee injection for bursitis   verbal consent was obtained to inject right knee PES BURSA  Timeout was completed to confirm the site of injection  The medications used were 40 mg of Depo-Medrol and 1% lidocaine 3 cc  Anesthesia was provided by ethyl chloride and the skin was prepped with alcohol.  After cleaning the skin with alcohol a 25-gauge needle was used to inject the right knee bursa.  There were no complications and a sterile bandage was applied    She declined any further treatment for the de Quervain's on the right wrist   I put her on a intermittent dose of steroids for synovitis of the left knee  Meds ordered this encounter  Medications  . predniSONE (DELTASONE) 10 MG tablet    Sig: Take 1 tablet (10 mg total) by mouth 3 (three) times daily.    Dispense:  60 tablet    Refill:  0

## 2020-04-07 ENCOUNTER — Ambulatory Visit (INDEPENDENT_AMBULATORY_CARE_PROVIDER_SITE_OTHER): Payer: BC Managed Care – PPO | Admitting: Family Medicine

## 2020-04-07 ENCOUNTER — Other Ambulatory Visit: Payer: Self-pay

## 2020-04-07 ENCOUNTER — Encounter: Payer: Self-pay | Admitting: Family Medicine

## 2020-04-07 VITALS — BP 126/82 | HR 93 | Temp 97.6°F | Ht 65.0 in | Wt 203.8 lb

## 2020-04-07 DIAGNOSIS — I10 Essential (primary) hypertension: Secondary | ICD-10-CM | POA: Diagnosis not present

## 2020-04-07 DIAGNOSIS — Z13220 Encounter for screening for lipoid disorders: Secondary | ICD-10-CM | POA: Diagnosis not present

## 2020-04-07 DIAGNOSIS — F411 Generalized anxiety disorder: Secondary | ICD-10-CM | POA: Insufficient documentation

## 2020-04-07 DIAGNOSIS — R454 Irritability and anger: Secondary | ICD-10-CM | POA: Insufficient documentation

## 2020-04-07 HISTORY — DX: Generalized anxiety disorder: F41.1

## 2020-04-07 MED ORDER — AMLODIPINE BESYLATE 5 MG PO TABS: 5.0000 mg | ORAL_TABLET | Freq: Every day | ORAL | 1 refills | Status: DC

## 2020-04-07 MED ORDER — ESCITALOPRAM OXALATE 5 MG PO TABS: 5.0000 mg | ORAL_TABLET | Freq: Every day | ORAL | 1 refills | Status: DC

## 2020-04-07 NOTE — Progress Notes (Addendum)
Patient ID: Erica Graves, female    DOB: 12/10/1965, 55 y.o.   MRN: [id number]   Chief Complaint  Patient presents with   Hypertension    Follow up- no problems or concerns per patient   Subjective:  CC: medication management for HTN  Presents today for medication management for hypertension.  Reports that she is taking her amlodipine as prescribed, tolerating it well no obvious side effects.  Has not had labs in a while, will get labs today.  Routine refills requested.  Reports that she occasionally takes NSAIDs, but not regularly.  Denies fever, chills, chest pain, shortness of breath.  Reports that she has had some dizziness which started last week that last 10 to 15 minutes.  No other associated symptoms.    Medical History Lysa has a past medical history of Carpal tunnel syndrome, bilateral, Chronic low back pain, Constipation, Counseling for estrogen replacement therapy (07/07/2014), Depression with anxiety, Dyspareunia (07/07/2014), GAD (generalized anxiety disorder) (04/07/2020), History of kidney stones, Hot flashes (07/07/2014), Hypertension, Menopause (07/07/2014), [place] (07/07/2014), Obesity, and Umbilical hernia.   Outpatient Encounter Medications as of 04/07/2020  Medication Sig   escitalopram (LEXAPRO) 5 MG tablet Take 1 tablet (5 mg total) by mouth daily.   amLODipine (NORVASC) 5 MG tablet Take 1 tablet (5 mg total) by mouth daily.   cholecalciferol (VITAMIN D) 1000 units tablet Take 1,000 Units by mouth daily.   diclofenac (VOLTAREN) 75 MG EC tablet Take 1 tablet (75 mg total) by mouth 2 (two) times daily with a meal. (Patient not taking: Reported on 04/07/2020)   indapamide (LOZOL) 1.25 MG tablet Take 1 tablet (1.25 mg total) by mouth daily.   meloxicam (MOBIC) 7.5 MG tablet Take 1 tablet (7.5 mg total) by mouth in the morning and at bedtime.   predniSONE (DELTASONE) 10 MG tablet Take 1 tablet (10 mg total) by mouth 3 (three) times daily. (Patient not taking:  Reported on 04/07/2020)   [DISCONTINUED] amLODipine (NORVASC) 5 MG tablet Take 1 tablet (5 mg total) by mouth daily.   No facility-administered encounter medications on file as of 04/07/2020.     Review of Systems  Constitutional: Negative for chills and fever.  HENT: Negative for congestion and ear pain.   Respiratory: Negative for cough, chest tightness and shortness of breath.   Cardiovascular: Negative for chest pain, palpitations and leg swelling.  Gastrointestinal: Negative for abdominal pain.  Neurological: Positive for dizziness.       Started last week, last 10-15 minutes and goes away.      Vitals BP 126/82    Pulse 93    Temp 97.6 F (36.4 C) (Oral)    Ht 5\' 5"  (1.651 m)    Wt 203 lb 12.8 oz (92.4 kg)    SpO2 96%    BMI 33.91 kg/m   Objective:   Physical Exam Vitals reviewed.  Constitutional:      Appearance: Normal appearance.  HENT:     Right Ear: Tympanic membrane normal.     Left Ear: Tympanic membrane normal.  Cardiovascular:     Rate and Rhythm: Normal rate and regular rhythm.     Heart sounds: Normal heart sounds.  Pulmonary:     Effort: Pulmonary effort is normal.     Breath sounds: Normal breath sounds.  Skin:    General: Skin is warm and dry.  Neurological:     General: No focal deficit present.     Mental Status: She is alert.  Psychiatric:  Behavior: Behavior normal.      Assessment and Plan   1. Essential hypertension, benign - Comprehensive Metabolic Panel (CMET) - amLODipine (NORVASC) 5 MG tablet; Take 1 tablet (5 mg total) by mouth daily.  Dispense: 90 tablet; Refill: 1  2. Screening for cholesterol level - Lipid Profile  3. GAD (generalized anxiety disorder) - TSH - escitalopram (LEXAPRO) 5 MG tablet; Take 1 tablet (5 mg total) by mouth daily.  Dispense: 30 tablet; Refill: 1     Hypertension Medication compliance: Takes daily as prescribed. Denies chest pain, shortness of breath, lower extremity edema, vision changes,  headaches. Reports dizziness in past week.  Pertinent lab work: will get today.  ASCVD 10- year risk score: n/a Monitoring: every six months Side effects: None Continue current medication regimen: continue with amlodipine 5 mg daily.   Generalized anxiety disorder:  Wish to discuss irritability, and anxiety.  Reports that she is under a lot of stress, works in [employer], her coworkers have noticed she has been irritable.  This is affecting her sleep.  Wishes to try a medication for her symptoms.  GAD-7: 18 PHQ-9: 12  Wishes to start on an SSRI medication, to manage irritability and anxiety.  Warnings discussed. Denies thoughts of self harm/suicidal thoughts.   Serotonin reuptake inhibitor (SSRI) anti-depressant medications:  Are used to treat depression, OCD, panic disorder, PTSD, and social anxiety.   Warning:  There is some evidence that when you start on this medication class it can cause an increased risk of suicide (thoughts of self-harm) in patients 24 years and younger. All patients regardless of age should be aware of this serious side effect from this drug class.   * Do not stop this medication abruptly. Alert your healthcare provider if you are going to stop this medication so you can be safely weaned off of this drug.  Agrees with plan of care discussed today. Understands warning signs to seek further care: chest pain, shortness of breath, any significant change in health.  Understands to follow-up in 1 month for anxiety, to assess effectiveness of medication.  Warnings discussed as listed above.  She understands.  Blood pressure well controlled.  Will get routine lab work done today due to history of hypertension due to irritability, will also check TSH.  Will notify once results are available.  We will also discuss in detail at upcoming follow-up in 1 month.    Dorena Bodo, NP 04/07/20

## 2020-04-07 NOTE — Patient Instructions (Signed)
  Serotonin reuptake inhibitor (SSRI) anti-depressant medications:  Are used to treat depression, OCD, panic disorder, PTSD, and social anxiety.   Warning:  There is some evidence that when you start on this medication class it can cause an increased risk of suicide (thoughts of self-harm) in patients 24 years and younger. All patients regardless of age should be aware of this serious side effect from this drug class.   * Do not stop this medication abruptly. Alert your healthcare provider if you are going to stop this medication so you can be safely weaned off of this drug.          Generalized Anxiety Disorder, Adult Generalized anxiety disorder (GAD) is a mental health condition. Unlike normal worries, anxiety related to GAD is not triggered by a specific event. These worries do not fade or get better with time. GAD interferes with relationships, work, and school. GAD symptoms can vary from mild to severe. People with severe GAD can have intense waves of anxiety with physical symptoms that are similar to panic attacks. What are the causes? The exact cause of GAD is not known, but the following are believed to have an impact:  Differences in natural brain chemicals.  Genes passed down from parents to children.  Differences in the way threats are perceived.  Development during childhood.  Personality. What increases the risk? The following factors may make you more likely to develop this condition:  Being female.  Having a family history of anxiety disorders.  Being very shy.  Experiencing very stressful life events, such as the death of a loved one.  Having a very stressful family environment. What are the signs or symptoms? People with GAD often worry excessively about many things in their lives, such as their health and family. Symptoms may also include:  Mental and emotional symptoms: ? Worrying excessively about natural disasters. ? Fear of being  late. ? Difficulty concentrating. ? Fears that others are judging your performance.  Physical symptoms: ? Fatigue. ? Headaches, muscle tension, muscle twitches, trembling, or feeling shaky. ? Feeling like your heart is pounding or beating very fast. ? Feeling out of breath or like you cannot take a deep breath. ? Having trouble falling asleep or staying asleep, or experiencing restlessness. ? Sweating. ? Nausea, diarrhea, or irritable bowel syndrome (IBS).  Behavioral symptoms: ? Experiencing erratic moods or irritability. ? Avoidance of new situations. ? Avoidance of people. ? Extreme difficulty making decisions. How is this diagnosed? This condition is diagnosed based on your symptoms and medical history. You will also have a physical exam. Your health care provider may perform tests to rule out other possible causes of your symptoms. To be diagnosed with GAD, a person must have anxiety that:  Is out of his or her control.  Affects several different aspects of his or her life, such as work and relationships.  Causes distress that makes him or her unable to take part in normal activities.  Includes at least three symptoms of GAD, such as restlessness, fatigue, trouble concentrating, irritability, muscle tension, or sleep problems. Before your health care provider can confirm a diagnosis of GAD, these symptoms must be present more days than they are not, and they must last for 6 months or longer. How is this treated? This condition may be treated with:  Medicine. Antidepressant medicine is usually prescribed for long-term daily control. Anti-anxiety medicines may be added in severe cases, especially when panic attacks occur.  Talk therapy (psychotherapy). Certain types   treating GAD by providing support, education, and guidance. Options include: ? Cognitive behavioral therapy (CBT). People learn coping skills and self-calming techniques to ease their physical  symptoms. They learn to identify unrealistic thoughts and behaviors and to replace them with more appropriate thoughts and behaviors. ? Acceptance and commitment therapy (ACT). This treatment teaches people how to be mindful as a way to cope with unwanted thoughts and feelings. ? Biofeedback. This process trains you to manage your body's response (physiological response) through breathing techniques and relaxation methods. You will work with a therapist while machines are used to monitor your physical symptoms.  Stress management techniques. These include yoga, meditation, and exercise. A mental health specialist can help determine which treatment is best for you. Some people see improvement with one type of therapy. However, other people require a combination of therapies.   Follow these instructions at home: Lifestyle  Maintain a consistent routine and schedule.  Anticipate stressful situations. Create a plan, and allow extra time to work with your plan.  Practice stress management or self-calming techniques that you have learned from your therapist or your health care provider. General instructions  Take over-the-counter and prescription medicines only as told by your health care provider.  Understand that you are likely to have setbacks. Accept this and be kind to yourself as you persist to take better care of yourself.  Recognize and accept your accomplishments, even if you judge them as small.  Keep all follow-up visits as told by your health care provider. This is important. Contact a health care provider if:  Your symptoms do not get better.  Your symptoms get worse.  You have signs of depression, such as: ? A persistently sad or irritable mood. ? Loss of enjoyment in activities that used to bring you joy. ? Change in weight or eating. ? Changes in sleeping habits. ? Avoiding friends or family members. ? Loss of energy for normal tasks. ? Feelings of guilt or  worthlessness. Get help right away if:  You have serious thoughts about hurting yourself or others. If you ever feel like you may hurt yourself or others, or have thoughts about taking your own life, get help right away. Go to your nearest emergency department or:  Call your local emergency services (911 in the [place]  Call a suicide crisis helpline, such as the National Suicide Prevention Lifeline at (514) 114-7986. This is open 24 hours a day in the [place]  Text the Crisis Text Line at (743)590-8957 (in the [place] Summary  Generalized anxiety disorder (GAD) is a mental health condition that involves worry that is not triggered by a specific event.  People with GAD often worry excessively about many things in their lives, such as their health and family.  GAD may cause symptoms such as restlessness, trouble concentrating, sleep problems, frequent sweating, nausea, diarrhea, headaches, and trembling or muscle twitching.  A mental health specialist can help determine which treatment is best for you. Some people see improvement with one type of therapy. However, other people require a combination of therapies. This information is not intended to replace advice given to you by your health care provider. Make sure you discuss any questions you have with your health care provider. Document Revised: 12/03/2018 Document Reviewed: 12/03/2018 Elsevier Patient Education  2021 [employer].

## 2020-05-03 LAB — COMPREHENSIVE METABOLIC PANEL
ALT: 16 IU/L (ref 0–32)
AST: 20 IU/L (ref 0–40)
Albumin/Globulin Ratio: 1.7 (ref 1.2–2.2)
Albumin: 4.7 g/dL (ref 3.8–4.9)
Alkaline Phosphatase: 68 IU/L (ref 44–121)
BUN/Creatinine Ratio: 25 — ABNORMAL HIGH (ref 9–23)
BUN: 19 mg/dL (ref 6–24)
Bilirubin Total: 0.2 mg/dL (ref 0.0–1.2)
CO2: 22 mmol/L (ref 20–29)
Calcium: 9.3 mg/dL (ref 8.7–10.2)
Chloride: 105 mmol/L (ref 96–106)
Creatinine, Ser: 0.77 mg/dL (ref 0.57–1.00)
Globulin, Total: 2.8 g/dL (ref 1.5–4.5)
Glucose: 88 mg/dL (ref 65–99)
Potassium: 4.2 mmol/L (ref 3.5–5.2)
Sodium: 141 mmol/L (ref 134–144)
Total Protein: 7.5 g/dL (ref 6.0–8.5)
eGFR: 90 mL/min/{1.73_m2} (ref >59)

## 2020-05-03 LAB — LIPID PANEL
Chol/HDL Ratio: 3.5 ratio (ref 0.0–4.4)
Cholesterol, Total: 163 mg/dL (ref 100–199)
HDL: 47 mg/dL (ref >39)
LDL Chol Calc (NIH): 97 mg/dL (ref 0–99)
Triglycerides: 104 mg/dL (ref 0–149)
VLDL Cholesterol Cal: 19 mg/dL (ref 5–40)

## 2020-05-03 LAB — TSH: TSH: 1.69 u[IU]/mL (ref 0.450–4.500)

## 2020-05-05 ENCOUNTER — Other Ambulatory Visit: Payer: Self-pay

## 2020-05-05 ENCOUNTER — Ambulatory Visit: Payer: BC Managed Care – PPO | Admitting: Family Medicine

## 2020-05-05 ENCOUNTER — Encounter: Payer: Self-pay | Admitting: Family Medicine

## 2020-05-05 DIAGNOSIS — F411 Generalized anxiety disorder: Secondary | ICD-10-CM | POA: Diagnosis not present

## 2020-05-05 MED ORDER — ESCITALOPRAM OXALATE 5 MG PO TABS: 5.0000 mg | ORAL_TABLET | Freq: Every day | ORAL | 0 refills | Status: DC

## 2020-05-05 NOTE — Patient Instructions (Signed)
Generalized Anxiety Disorder, Adult Generalized anxiety disorder (GAD) is a mental health condition. Unlike normal worries, anxiety related to GAD is not triggered by a specific event. These worries do not fade or get better with time. GAD interferes with relationships, work, and school. GAD symptoms can vary from mild to severe. People with severe GAD can have intense waves of anxiety with physical symptoms that are similar to panic attacks. What are the causes? The exact cause of GAD is not known, but the following are believed to have an impact:  Differences in natural brain chemicals.  Genes passed down from parents to children.  Differences in the way threats are perceived.  Development during childhood.  Personality. What increases the risk? The following factors may make you more likely to develop this condition:  Being female.  Having a family history of anxiety disorders.  Being very shy.  Experiencing very stressful life events, such as the death of a loved one.  Having a very stressful family environment. What are the signs or symptoms? People with GAD often worry excessively about many things in their lives, such as their health and family. Symptoms may also include:  Mental and emotional symptoms: ? Worrying excessively about natural disasters. ? Fear of being late. ? Difficulty concentrating. ? Fears that others are judging your performance.  Physical symptoms: ? Fatigue. ? Headaches, muscle tension, muscle twitches, trembling, or feeling shaky. ? Feeling like your heart is pounding or beating very fast. ? Feeling out of breath or like you cannot take a deep breath. ? Having trouble falling asleep or staying asleep, or experiencing restlessness. ? Sweating. ? Nausea, diarrhea, or irritable bowel syndrome (IBS).  Behavioral symptoms: ? Experiencing erratic moods or irritability. ? Avoidance of new situations. ? Avoidance of people. ? Extreme difficulty  making decisions. How is this diagnosed? This condition is diagnosed based on your symptoms and medical history. You will also have a physical exam. Your health care provider may perform tests to rule out other possible causes of your symptoms. To be diagnosed with GAD, a person must have anxiety that:  Is out of his or her control.  Affects several different aspects of his or her life, such as work and relationships.  Causes distress that makes him or her unable to take part in normal activities.  Includes at least three symptoms of GAD, such as restlessness, fatigue, trouble concentrating, irritability, muscle tension, or sleep problems. Before your health care provider can confirm a diagnosis of GAD, these symptoms must be present more days than they are not, and they must last for 6 months or longer. How is this treated? This condition may be treated with:  Medicine. Antidepressant medicine is usually prescribed for long-term daily control. Anti-anxiety medicines may be added in severe cases, especially when panic attacks occur.  Talk therapy (psychotherapy). Certain types of talk therapy can be helpful in treating GAD by providing support, education, and guidance. Options include: ? Cognitive behavioral therapy (CBT). People learn coping skills and self-calming techniques to ease their physical symptoms. They learn to identify unrealistic thoughts and behaviors and to replace them with more appropriate thoughts and behaviors. ? Acceptance and commitment therapy (ACT). This treatment teaches people how to be mindful as a way to cope with unwanted thoughts and feelings. ? Biofeedback. This process trains you to manage your body's response (physiological response) through breathing techniques and relaxation methods. You will work with a therapist while machines are used to monitor your physical symptoms.    Stress management techniques. These include yoga, meditation, and exercise. A mental  health specialist can help determine which treatment is best for you. Some people see improvement with one type of therapy. However, other people require a combination of therapies.   Follow these instructions at home: Lifestyle  Maintain a consistent routine and schedule.  Anticipate stressful situations. Create a plan, and allow extra time to work with your plan.  Practice stress management or self-calming techniques that you have learned from your therapist or your health care provider. General instructions  Take over-the-counter and prescription medicines only as told by your health care provider.  Understand that you are likely to have setbacks. Accept this and be kind to yourself as you persist to take better care of yourself.  Recognize and accept your accomplishments, even if you judge them as small.  Keep all follow-up visits as told by your health care provider. This is important. Contact a health care provider if:  Your symptoms do not get better.  Your symptoms get worse.  You have signs of depression, such as: ? A persistently sad or irritable mood. ? Loss of enjoyment in activities that used to bring you joy. ? Change in weight or eating. ? Changes in sleeping habits. ? Avoiding friends or family members. ? Loss of energy for normal tasks. ? Feelings of guilt or worthlessness. Get help right away if:  You have serious thoughts about hurting yourself or others. If you ever feel like you may hurt yourself or others, or have thoughts about taking your own life, get help right away. Go to your nearest emergency department or:  Call your local emergency services (911 in the [place]  Call a suicide crisis helpline, such as the National Suicide Prevention Lifeline at 1-800-273-8255. This is open 24 hours a day in the [place]  Text the Crisis Text Line at 741741 (in the [place] Summary  Generalized anxiety disorder (GAD) is a mental health condition that involves worry  that is not triggered by a specific event.  People with GAD often worry excessively about many things in their lives, such as their health and family.  GAD may cause symptoms such as restlessness, trouble concentrating, sleep problems, frequent sweating, nausea, diarrhea, headaches, and trembling or muscle twitching.  A mental health specialist can help determine which treatment is best for you. Some people see improvement with one type of therapy. However, other people require a combination of therapies. This information is not intended to replace advice given to you by your health care provider. Make sure you discuss any questions you have with your health care provider. Document Revised: 12/03/2018 Document Reviewed: 12/03/2018 Elsevier Patient Education  2021 [employer]  

## 2020-05-05 NOTE — Progress Notes (Signed)
Patient ID: Erica Graves, female    DOB: [birth year], 55 y.o.   MRN: [id number]   Chief Complaint  Patient presents with  . Anxiety    Follow up   Subjective:  CC: follow-up for anxiety Patient states she is feeling calmer  Presents today for medication management follow-up for anxiety.  Was initially seen on February 10, discussed her anxiety and stress level at that time, Lexapro started.  She reports that she is tolerating the medication well, she feels much calmer now and feels slower to get upset.  Also reports that her husband has seen significant improvement.  She denies fever, chills, chest pain, shortness of breath.  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    Medical History Erica Graves has a past medical history of Carpal tunnel syndrome, bilateral, Chronic low back pain, Constipation, Counseling for estrogen replacement therapy (07/07/2014), Depression with anxiety, Dyspareunia (07/07/2014), GAD (generalized anxiety disorder) (04/07/2020), History of kidney stones, Hot flashes (07/07/2014), Hypertension, Menopause (07/07/2014), Moody (07/07/2014), Obesity, and Umbilical hernia.   Outpatient Encounter Medications as of 05/05/2020  Medication Sig  . amLODipine (NORVASC) 5 MG tablet Take 1 tablet (5 mg total) by mouth daily.  . cholecalciferol (VITAMIN D) 1000 units tablet Take 1,000 Units by mouth daily.  Marland Kitchen escitalopram (LEXAPRO) 5 MG tablet Take 1 tablet (5 mg total) by mouth daily.  . indapamide (LOZOL) 1.25 MG tablet Take 1 tablet (1.25 mg total) by mouth daily.  . predniSONE (DELTASONE) 10 MG tablet Take 1 tablet (10 mg total) by mouth 3 (three) times daily. (Patient not taking: Reported on 04/07/2020)  . [DISCONTINUED] diclofenac (VOLTAREN) 75 MG EC tablet Take 1 tablet (75 mg total) by mouth 2 (two) times daily with a meal. (Patient not taking: Reported on 04/07/2020)  . [DISCONTINUED] escitalopram (LEXAPRO) 5 MG tablet Take 1 tablet  (5 mg total) by mouth daily.  . [DISCONTINUED] meloxicam (MOBIC) 7.5 MG tablet Take 1 tablet (7.5 mg total) by mouth in the morning and at bedtime.   No facility-administered encounter medications on file as of 05/05/2020.     Review of Systems  Constitutional: Positive for fatigue. Negative for chills and fever.       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   Respiratory: Negative for shortness of breath.   Cardiovascular: Negative for chest pain.  Gastrointestinal: Negative for abdominal pain.  Neurological: Negative for dizziness, light-headedness and headaches.  Psychiatric/Behavioral: Positive for sleep disturbance. Negative for self-injury.       Takes melatonin to help.      Vitals BP 126/82   Pulse 90   Temp (!) 97.3 F (36.3 C) (Oral)   Ht 5\' 5"  (1.651 m)   Wt 201 lb 9.6 oz (91.4 kg)   SpO2 97%   BMI 33.55 kg/m   Objective:   Physical Exam Vitals and nursing note reviewed.  Constitutional:      Appearance: Normal appearance.  Cardiovascular:     Rate and Rhythm: Normal rate and regular rhythm.     Heart sounds: Normal heart sounds.  Pulmonary:     Effort: Pulmonary effort is normal.     Breath sounds: Normal breath sounds.  Skin:    General: Skin is warm and dry.  Neurological:     General: No focal deficit present.     Mental Status: She is alert.  Psychiatric:        Behavior: Behavior normal.  Comments: GAD-7: 10. Mood improved.       Assessment and Plan   1. GAD (generalized anxiety disorder) - escitalopram (LEXAPRO) 5 MG tablet; Take 1 tablet (5 mg total) by mouth daily.  Dispense: 90 tablet; Refill: 0   GAD:  Compliant with medications, taking daily as prescribed.Reports tolerating well and feeling calmer and slower to get upset. Reports that husband has noticed a difference. Denies suicidal thoughts or ideation.  Will continue medication at current dose as prescribed.  Refill sent today for 3 months.  Agrees with plan of care discussed  today. Understands warning signs to seek further care: chest pain, shortness of breath, any significant change in health.  Understands to follow-up in 3 months for medication management for anxiety, sooner if needed.

## 2020-06-23 ENCOUNTER — Other Ambulatory Visit: Payer: Self-pay

## 2020-06-23 ENCOUNTER — Encounter: Payer: Self-pay | Admitting: Orthopedic Surgery

## 2020-06-23 ENCOUNTER — Ambulatory Visit: Payer: BC Managed Care – PPO | Admitting: Orthopedic Surgery

## 2020-06-23 VITALS — BP 144/91 | HR 70 | Ht 65.0 in

## 2020-06-23 DIAGNOSIS — M722 Plantar fascial fibromatosis: Secondary | ICD-10-CM

## 2020-06-23 DIAGNOSIS — M7051 Other bursitis of knee, right knee: Secondary | ICD-10-CM

## 2020-06-23 NOTE — Patient Instructions (Signed)
Use Aspercreme, Biofreeze or Voltaren gel over the counter 2-3 times daily make sure you rub it in well each time you use it.    Use ice every evening at least 30 minutes on the knee  For the heel get the heel cups at [facility]

## 2020-06-23 NOTE — Progress Notes (Signed)
Chief Complaint  Patient presents with  . Knee Pain    Right knee pain, patient is requesting an injection.     Erica Graves presents for pain in her right knee just below the joint just next to the tibial tubercle  No provocative factors  Examination Ambulates without assistive device and no antalgic gait Tenderness over the pes bursa at its the insertion of the tendons  Knee exam otherwise normal  Second complaint pain right heel Exam shows decreased dorsiflexion tightness of the heel cord and tenderness in the plantar aspect of the foot  Diagnosis #1 pes bursitis  Diagnosis #2 Planter fasciitis  Plan inject right knee pes bursa She gave consent for the injection we confirmed the site We injected Celestone 6 mg and lidocaine 1% 2 cc We used alcohol and ethyl chloride to prepare the knee for injection There were no complications  Tuli's heel cups ordered for Planter fasciitis  Follow-up as needed

## 2020-08-05 ENCOUNTER — Ambulatory Visit: Payer: BC Managed Care – PPO | Admitting: Family Medicine

## 2020-09-10 ENCOUNTER — Encounter: Payer: Self-pay | Admitting: Emergency Medicine

## 2020-09-10 ENCOUNTER — Ambulatory Visit
Admission: EM | Admit: 2020-09-10 | Discharge: 2020-09-10 | Disposition: A | Discharge status: Home / Self Care | Payer: BC Managed Care – PPO | Attending: Emergency Medicine | Admitting: Emergency Medicine

## 2020-09-10 ENCOUNTER — Other Ambulatory Visit: Payer: Self-pay

## 2020-09-10 DIAGNOSIS — M25531 Pain in right wrist: Secondary | ICD-10-CM | POA: Diagnosis not present

## 2020-09-10 DIAGNOSIS — M654 Radial styloid tenosynovitis [de Quervain]: Secondary | ICD-10-CM

## 2020-09-10 MED ORDER — METHYLPREDNISOLONE 4 MG PO TBPK: ORAL_TABLET | ORAL | 0 refills | Status: DC

## 2020-09-10 NOTE — ED Provider Notes (Signed)
[facility]   [id number] 09/10/20 Arrival Time: 1022  CC:RT wrist PAIN  SUBJECTIVE: History from: patient. Erica Graves is a 55 y.o. female complains of RT wrist pain x 1 month.  Denies a precipitating event or specific injury.  Localizes the pain to the base of thumb.  Describes the pain as intermittent.  Has tried OTC medications without relief.  Symptoms are made worse with wrist movement.  Denies similar symptoms in the past.  Denies fever, chills, erythema, ecchymosis, effusion, weakness, numbness and tingling.  ROS: As per HPI.  All other pertinent ROS negative.     Past Medical History:  Diagnosis Date   Carpal tunnel syndrome, bilateral    Chronic low back pain    Constipation    Counseling for estrogen replacement therapy 07/07/2014   Depression with anxiety    Dyspareunia 07/07/2014   GAD (generalized anxiety disorder) 04/07/2020   History of kidney stones    Hot flashes 07/07/2014   Hypertension    Menopause 07/07/2014   Moody 07/07/2014   Obesity    Umbilical hernia    Past Surgical History:  Procedure Laterality Date   ABDOMINAL HYSTERECTOMY     BACK SURGERY     removal of cyst subdermal   CARPAL TUNNEL RELEASE Left 09/30/2014   Procedure: CARPAL TUNNEL RELEASE;  Surgeon: Vickki Hearing, MD;  Location: [facility];  Service: Orthopedics;  Laterality: Left;   CARPAL TUNNEL RELEASE Right 09/28/2015   Procedure: CARPAL TUNNEL RELEASE;  Surgeon: Vickki Hearing, MD;  Location: [facility];  Service: Orthopedics;  Laterality: Right;   COLONOSCOPY N/A 06/29/2013   Procedure: COLONOSCOPY;  Surgeon: West Bali, MD;  Location: [facility];  Service: Endoscopy;  Laterality: N/A;  1:30   KNEE ARTHROSCOPY Right    KNEE ARTHROSCOPY  05/21/2011   Procedure: ARTHROSCOPY KNEE;  Surgeon: Vickki Hearing, MD;  Location: [facility];  Service: Orthopedics;  Laterality: Left;  lateral menisectomy   KNEE CLOSED REDUCTION Left 05/26/2012   Procedure: CLOSED MANIPULATION KNEE;   Surgeon: Vickki Hearing, MD;  Location: [facility];  Service: Orthopedics;  Laterality: Left;   left knee arthroscopy  08/14/2005 Dr. Romeo Apple torn meniscus   LUMBAR SPINE SURGERY     PARTIAL HYSTERECTOMY     TOTAL KNEE ARTHROPLASTY  03/31/2012   Procedure: TOTAL KNEE ARTHROPLASTY;  Surgeon: Vickki Hearing, MD;  Location: [facility];  Service: Orthopedics;  Laterality: Left;  Left Total Knee Arthroplasty   TRIGGER FINGER RELEASE Right 01/24/2017   Procedure: RELEASE TRIGGER FINGER/A-1 PULLEY right thumb;  Surgeon: Vickki Hearing, MD;  Location: [facility];  Service: Orthopedics;  Laterality: Right;   Allergies  Allergen Reactions   Norco [Hydrocodone-Acetaminophen] Nausea And Vomiting   No current facility-administered medications on file prior to encounter.   Current Outpatient Medications on File Prior to Encounter  Medication Sig Dispense Refill   amLODipine (NORVASC) 5 MG tablet Take 1 tablet (5 mg total) by mouth daily. 90 tablet 1   cholecalciferol (VITAMIN D) 1000 units tablet Take 1,000 Units by mouth daily.     escitalopram (LEXAPRO) 5 MG tablet Take 1 tablet (5 mg total) by mouth daily. 90 tablet 0   indapamide (LOZOL) 1.25 MG tablet Take 1 tablet (1.25 mg total) by mouth daily. 90 tablet 1   Social History   Socioeconomic History   Marital status: Married    Spouse name: Not on file   Number of children: 3   Years of  education: college   Highest education level: Not on file  Occupational History   Occupation: school Youth worker    Employer: [employer]    Comment: [place] [employer]    Employer: [employer]  Tobacco Use   Smoking status: Never   Smokeless tobacco: Never  Vaping Use   Vaping Use: Never used  Substance and Sexual Activity   Alcohol use: Yes    Comment: occ   Drug use: No   Sexual activity: Yes    Birth control/protection: Surgical    Comment: hyst  Other Topics Concern   Not on file  Social History Narrative    Not on file   Social Determinants of Health   Financial Resource Strain: Not on file  Food Insecurity: Not on file  Transportation Needs: Not on file  Physical Activity: Not on file  Stress: Not on file  Social Connections: Not on file  Intimate Partner Violence: Not on file   Family History  Problem Relation Age of Onset   Heart disease Mother    Hypertension Mother    Colon cancer Neg Hx     OBJECTIVE:  Vitals:   09/10/20 1028  BP: 140/90  Pulse: 62  Resp: 15  Temp: 98.4 F (36.9 C)  TempSrc: Oral  SpO2: 98%    General appearance: ALERT; in no acute distress.  Head: NCAT Lungs: Normal respiratory effort CV: Radial pulse 2+ Musculoskeletal: RT wrist Inspection: Skin warm, dry, clear and intact without obvious erythema, effusion, or ecchymosis.  Palpation: TTP over distal lateral wrist, and base of thumb ROM: LROM Strength: deferred + finkelstein's Skin: warm and dry Neurologic: Ambulates without difficulty; Sensation intact about the upper extremities Psychological: alert and cooperative; normal mood and affect  ASSESSMENT & PLAN:  1. Right wrist pain   2. Tendinitis, de Quervain's     Meds ordered this encounter  Medications   methylPREDNISolone (MEDROL DOSEPAK) 4 MG TBPK tablet    Sig: Use medrol dos pak as directed; 6 pills by mouth on day 1, 5 pills day 2, 4 pills day 3, 3 pills day 4, 2 pills day 5, 1 pill day 1 (4 mg pills)    Dispense:  21 each    Refill:  0    Order Specific Question:   Supervising Provider    Answer:   Eustace Moore [[id number]]    Continue conservative management of rest, ice, and elevation Medrol dose pak Follow up with orthopedist Return or go to the ER if you have any new or worsening symptoms (fever, chills, chest pain, redness, swelling, etc...)   Reviewed expectations re: course of current medical issues. Questions answered. Outlined signs and symptoms indicating need for more acute intervention. Patient  verbalized understanding. After Visit Summary given.     Rennis Harding, PA-C 09/10/20 1108

## 2020-09-10 NOTE — Discharge Instructions (Addendum)
Continue conservative management of rest, ice, and elevation Medrol dose pak Follow up with orthopedist Return or go to the ER if you have any new or worsening symptoms (fever, chills, chest pain, redness, swelling, etc...)

## 2020-09-10 NOTE — ED Triage Notes (Signed)
Patient c/o right wrist pain, no injury. Pain x 1 month just tenderness and limited rom. has appointment with ortho later in the month

## 2020-09-22 ENCOUNTER — Other Ambulatory Visit: Payer: Self-pay

## 2020-09-22 ENCOUNTER — Encounter: Payer: Self-pay | Admitting: Orthopedic Surgery

## 2020-09-22 ENCOUNTER — Ambulatory Visit: Payer: BC Managed Care – PPO | Admitting: Orthopedic Surgery

## 2020-09-22 VITALS — BP 145/77 | HR 77 | Ht 65.0 in | Wt 200.0 lb

## 2020-09-22 DIAGNOSIS — M19041 Primary osteoarthritis, right hand: Secondary | ICD-10-CM

## 2020-09-22 DIAGNOSIS — M654 Radial styloid tenosynovitis [de Quervain]: Secondary | ICD-10-CM | POA: Diagnosis not present

## 2020-09-22 DIAGNOSIS — M19049 Primary osteoarthritis, unspecified hand: Secondary | ICD-10-CM

## 2020-09-22 MED ORDER — PREDNISONE 10 MG PO TABS: 10.0000 mg | ORAL_TABLET | Freq: Every day | ORAL | 0 refills | Status: DC

## 2020-09-22 NOTE — Progress Notes (Signed)
Chief Complaint  Patient presents with   Hand Pain    Right thumb pain

## 2020-09-22 NOTE — Patient Instructions (Signed)
[facility] will call you with appointment. If you have not heard from them in 2-3 business days call them to schedule [phone]   

## 2020-09-22 NOTE — Progress Notes (Signed)
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    Dispense:  21 tablet    Refill:  0    Until she sees the hand specialist

## 2020-09-22 NOTE — Addendum Note (Signed)
Addended byCaffie Damme on: 09/22/2020 03:57 PM   Modules accepted: Orders

## 2020-10-07 DIAGNOSIS — M1811 Unilateral primary osteoarthritis of first carpometacarpal joint, right hand: Secondary | ICD-10-CM | POA: Insufficient documentation

## 2020-10-07 DIAGNOSIS — M654 Radial styloid tenosynovitis [de Quervain]: Secondary | ICD-10-CM | POA: Insufficient documentation

## 2020-11-17 ENCOUNTER — Ambulatory Visit: Payer: BC Managed Care – PPO | Admitting: Orthopedic Surgery

## 2020-12-08 ENCOUNTER — Ambulatory Visit: Payer: BC Managed Care – PPO | Admitting: Orthopedic Surgery

## 2020-12-08 ENCOUNTER — Ambulatory Visit: Payer: BC Managed Care – PPO

## 2020-12-08 ENCOUNTER — Other Ambulatory Visit: Payer: Self-pay

## 2020-12-08 VITALS — BP 161/80 | HR 63 | Ht 65.0 in | Wt 211.4 lb

## 2020-12-08 DIAGNOSIS — Z96652 Presence of left artificial knee joint: Secondary | ICD-10-CM

## 2020-12-08 DIAGNOSIS — M1712 Unilateral primary osteoarthritis, left knee: Secondary | ICD-10-CM | POA: Diagnosis not present

## 2020-12-08 NOTE — Progress Notes (Signed)
FOLLOW UP   Encounter Diagnosis  Name Primary?   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     Erica Graves is here for the 8-1/2-year follow-up on her left total knee.  She had a DePuy fixed-bearing posterior stabilized Sigma replacement  She is doing well with no complaints  Her range of motion is full extension to 115 degrees of flexion and I do not detect any instability in the knee  Her x-ray shows no loosening  Follow-up in a year and a half for the 10-year follow-up x-ray

## 2020-12-22 DIAGNOSIS — M65839 Other synovitis and tenosynovitis, unspecified forearm: Secondary | ICD-10-CM | POA: Insufficient documentation

## 2021-03-17 ENCOUNTER — Other Ambulatory Visit: Payer: Self-pay | Admitting: Nurse Practitioner

## 2021-03-17 ENCOUNTER — Other Ambulatory Visit: Payer: Self-pay

## 2021-03-17 ENCOUNTER — Encounter: Payer: Self-pay | Admitting: Nurse Practitioner

## 2021-03-17 ENCOUNTER — Ambulatory Visit: Payer: BC Managed Care – PPO | Admitting: Nurse Practitioner

## 2021-03-17 VITALS — BP 134/83 | HR 82 | Ht 65.0 in | Wt 212.8 lb

## 2021-03-17 DIAGNOSIS — K219 Gastro-esophageal reflux disease without esophagitis: Secondary | ICD-10-CM

## 2021-03-17 DIAGNOSIS — F411 Generalized anxiety disorder: Secondary | ICD-10-CM | POA: Diagnosis not present

## 2021-03-17 DIAGNOSIS — I10 Essential (primary) hypertension: Secondary | ICD-10-CM

## 2021-03-17 DIAGNOSIS — Z13220 Encounter for screening for lipoid disorders: Secondary | ICD-10-CM

## 2021-03-17 DIAGNOSIS — R5383 Other fatigue: Secondary | ICD-10-CM

## 2021-03-17 MED ORDER — PANTOPRAZOLE SODIUM 40 MG PO TBEC: 40.0000 mg | DELAYED_RELEASE_TABLET | Freq: Every day | ORAL | 1 refills | Status: DC

## 2021-03-17 MED ORDER — SERTRALINE HCL 50 MG PO TABS: ORAL_TABLET | ORAL | 0 refills | Status: DC

## 2021-03-17 NOTE — Patient Instructions (Signed)
Food Choices for Gastroesophageal Reflux Disease, Adult °When you have gastroesophageal reflux disease (GERD), the foods you eat and your eating habits are very important. Choosing the right foods can help ease the discomfort of GERD. Consider working with a dietitian to help you make healthy food choices. °What are tips for following this plan? °Reading food labels °Look for foods that are low in saturated fat. Foods that have less than 5% of daily value (DV) of fat and 0 g of trans fats may help with your symptoms. °Cooking °Cook foods using methods other than frying. This may include baking, steaming, grilling, or broiling. These are all methods that do not need a lot of fat for cooking. °To add flavor, try to use herbs that are low in spice and acidity. °Meal planning ° °Choose healthy foods that are low in fat, such as fruits, vegetables, whole grains, low-fat dairy products, lean meats, fish, and poultry. °Eat frequent, small meals instead of three large meals each day. Eat your meals slowly, in a relaxed setting. Avoid bending over or lying down until 2-3 hours after eating. °Limit high-fat foods such as fatty meats or fried foods. °Limit your intake of fatty foods, such as oils, butter, and shortening. °Avoid the following as told by your health care provider: °Foods that cause symptoms. These may be different for different people. Keep a food diary to keep track of foods that cause symptoms. °Alcohol. °Drinking large amounts of liquid with meals. °Eating meals during the 2-3 hours before bed. °Lifestyle °Maintain a healthy weight. Ask your health care provider what weight is healthy for you. If you need to lose weight, work with your health care provider to do so safely. °Exercise for at least 30 minutes on 5 or more days each week, or as told by your health care provider. °Avoid wearing clothes that fit tightly around your waist and chest. °Do not use any products that contain nicotine or tobacco. These  products include cigarettes, chewing tobacco, and vaping devices, such as e-cigarettes. If you need help quitting, ask your health care provider. °Sleep with the head of your bed raised. Use a wedge under the mattress or blocks under the bed frame to raise the head of the bed. °Chew sugar-free gum after mealtimes. °What foods should I eat? °Eat a healthy, well-balanced diet of fruits, vegetables, whole grains, low-fat dairy products, lean meats, fish, and poultry. Each person is different. Foods that may trigger symptoms in one person may not trigger any symptoms in another person. Work with your health care provider to identify foods that are safe for you. °The items listed above may not be a complete list of recommended foods and beverages. Contact a dietitian for more information. °What foods should I avoid? °Limiting some of these foods may help manage the symptoms of GERD. Everyone is different. Consult a dietitian or your health care provider to help you identify the exact foods to avoid, if any. °Fruits °Any fruits prepared with added fat. Any fruits that cause symptoms. For some people this may include citrus fruits, such as oranges, grapefruit, pineapple, and lemons. °Vegetables °Deep-fried vegetables. French fries. Any vegetables prepared with added fat. Any vegetables that cause symptoms. For some people, this may include tomatoes and tomato products, chili peppers, onions and garlic, and horseradish. °Grains °Pastries or quick breads with added fat. °Meats and other proteins °High-fat meats, such as fatty beef or pork, hot dogs, ribs, ham, sausage, salami, and bacon. Fried meat or protein, including fried   fish and fried chicken. Nuts and nut butters, in large amounts. °Dairy °Whole milk and chocolate milk. Sour cream. Cream. Ice cream. Cream cheese. Milkshakes. °Fats and oils °Butter. Margarine. Shortening. Ghee. °Beverages °Coffee and tea, with or without caffeine. Carbonated beverages. Sodas. Energy  drinks. Fruit juice made with acidic fruits, such as orange or grapefruit. Tomato juice. Alcoholic drinks. °Sweets and desserts °Chocolate and cocoa. Donuts. °Seasonings and condiments °Pepper. Peppermint and spearmint. Added salt. Any condiments, herbs, or seasonings that cause symptoms. For some people, this may include curry, hot sauce, or vinegar-based salad dressings. °The items listed above may not be a complete list of foods and beverages to avoid. Contact a dietitian for more information. °Questions to ask your health care provider °Diet and lifestyle changes are usually the first steps that are taken to manage symptoms of GERD. If diet and lifestyle changes do not improve your symptoms, talk with your health care provider about taking medicines. °Where to find more information °International Foundation for Gastrointestinal Disorders: aboutgerd.org °Summary °When you have gastroesophageal reflux disease (GERD), food and lifestyle choices may be very helpful in easing the discomfort of GERD. °Eat frequent, small meals instead of three large meals each day. Eat your meals slowly, in a relaxed setting. Avoid bending over or lying down until 2-3 hours after eating. °Limit high-fat foods such as fatty meats or fried foods. °This information is not intended to replace advice given to you by your health care provider. Make sure you discuss any questions you have with your health care provider. °Document Revised: 08/24/2019 Document Reviewed: 08/24/2019 °Elsevier Patient Education © 2022 Elsevier Inc. ° °

## 2021-03-17 NOTE — Progress Notes (Signed)
Subjective:    Patient ID: Erica Graves, female    DOB: 1966-02-02, 56 y.o.   MRN: [id number]  Hypertension This is a chronic problem. The current episode started more than 1 year ago. Pertinent negatives include no chest pain or shortness of breath.  Problem reports problems with fatigue Presents for recheck on her hypertension.  BP outside the office is running 130s/80s.  Patient decided to stop her indapamide and amlodipine for her blood pressure.  States she was not sure if she needs to continue taking this.  Overall diet is fairly good.  Limited activity but has a lot of stress in her life due to personal issues.  States her mother has early dementia and has been living with her for a while.  Her aunt will come and stay with her mother while she is at work during the day but otherwise all of her care falls on her 7 days a week.  Has limited support from family otherwise.  States her husband is very understanding. Also having major flareup of her acid reflux and heartburn.  Has noticed this more lately with the increased stress.  Some caffeine intake.  Denies tobacco alcohol use.  Denies any excessive NSAID use.  Some citrus in her diet.  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  No chest pain/ischemic type pain or unusual shortness of breath. Was started on Lexapro in March 2022 but states she stopped this because it made her feel slightly "loopy". Gets regular preventive health physicals with gynecology. Review of Systems  Respiratory:  Negative for chest tightness and shortness of breath.   Cardiovascular:  Negative for chest pain and leg swelling.  Gastrointestinal:  Positive for constipation. Negative for blood in stool.  Psychiatric/Behavioral:  The patient is nervous/anxious.       Objective:   Physical Exam NAD.  Alert, oriented.  Mildly anxious affect.  Fatigued in appearance.  Making good eye contact.  Speech clear.  Dressed appropriately.  Thoughts logical coherent and  relevant.  Thyroid nontender to palpation, no mass or goiter noted.  Lungs clear.  Heart regular rate and rhythm.  Carotids no bruits or thrills.  Abdomen soft nondistended with distinct localized epigastric area tenderness to deep palpation.  No obvious masses.  Today's Vitals   03/17/21 1546  BP: 134/83  Pulse: 82  Weight: 212 lb 12.8 oz (96.5 kg)  Height: 5\' 5"  (1.651 m)   Body mass index is 35.41 kg/m.  BP on recheck right arm sitting 146/78.      Assessment & Plan:   Problem List Items Addressed This Visit       Cardiovascular and Mediastinum   Essential hypertension, benign - Primary   Relevant Orders   Comprehensive metabolic panel   Lipid panel     Digestive   Gastroesophageal reflux disease without esophagitis   Relevant Medications   pantoprazole (PROTONIX) 40 MG tablet   Other Relevant Orders   CBC with Differential/Platelet     Other   GAD (generalized anxiety disorder)   Relevant Medications   sertraline (ZOLOFT) 50 MG tablet   Other Visit Diagnoses     Fatigue, unspecified type       Relevant Orders   CBC with Differential/Platelet   Comprehensive metabolic panel   TSH   Screening for lipid disorders       Relevant Orders   Lipid panel      Meds ordered this encounter  Medications   sertraline (ZOLOFT)  50 MG tablet    Sig: Take 1/2 tab po qd x 6 days then one po qd    Dispense:  30 tablet    Refill:  0    Order Specific Question:   Supervising Provider    Answer:   Lilyan Punt A [[id number]]   pantoprazole (PROTONIX) 40 MG tablet    Sig: Take 1 tablet (40 mg total) by mouth daily.    Dispense:  30 tablet    Refill:  1    Order Specific Question:   Supervising Provider    Answer:   Babs Sciara [phone]   Given written and verbal information on lifestyle factors specifically diet that affects her reflux symptoms.  Trial of pantoprazole over the next several weeks.  Patient to call back in 2 weeks if symptoms have not significantly improved,  otherwise follow-up in 1 month. Start Zoloft as directed with plans to slowly titrate if needed.  Reviewed potential adverse effects including the rare risk of suicidality.  Discontinue medication and contact office if any problems.  Recommend that she consider counseling.  Also look into home health services or respite for her mother.  Recommend that she talk to her mother's primary care provider. Patient has some amlodipine at home.  Recommend that she restart this as directed. Labs ordered. Return in about 1 month (around 04/17/2021) for virtual or phone is fine. Call back sooner if needed.

## 2021-03-18 ENCOUNTER — Encounter: Payer: Self-pay | Admitting: Nurse Practitioner

## 2021-03-26 ENCOUNTER — Other Ambulatory Visit: Payer: Self-pay

## 2021-03-26 ENCOUNTER — Ambulatory Visit
Admission: EM | Admit: 2021-03-26 | Discharge: 2021-03-26 | Disposition: A | Discharge status: Home / Self Care | Payer: BC Managed Care – PPO | Attending: Student | Admitting: Student

## 2021-03-26 DIAGNOSIS — S39012A Strain of muscle, fascia and tendon of lower back, initial encounter: Secondary | ICD-10-CM

## 2021-03-26 DIAGNOSIS — R110 Nausea: Secondary | ICD-10-CM | POA: Diagnosis not present

## 2021-03-26 DIAGNOSIS — K219 Gastro-esophageal reflux disease without esophagitis: Secondary | ICD-10-CM

## 2021-03-26 LAB — POCT URINALYSIS DIP (MANUAL ENTRY)
Bilirubin, UA: NEGATIVE
Blood, UA: NEGATIVE
Glucose, UA: NEGATIVE mg/dL
Ketones, POC UA: NEGATIVE mg/dL
Leukocytes, UA: NEGATIVE
Nitrite, UA: NEGATIVE
Protein Ur, POC: NEGATIVE mg/dL
Spec Grav, UA: >=1.03 — AB (ref 1.010–1.025)
Urobilinogen, UA: 1 E.U./dL
pH, UA: 5.5 (ref 5.0–8.0)

## 2021-03-26 MED ORDER — ONDANSETRON 8 MG PO TBDP: 8.0000 mg | ORAL_TABLET | Freq: Three times a day (TID) | ORAL | 0 refills | Status: DC | PRN

## 2021-03-26 MED ORDER — ONDANSETRON 4 MG PO TBDP
4.0000 mg | ORAL_TABLET | Freq: Once | ORAL | Status: AC
  Administered 2021-03-26: 4 mg via ORAL

## 2021-03-26 MED ORDER — TIZANIDINE HCL 2 MG PO TABS: 2.0000 mg | ORAL_TABLET | Freq: Three times a day (TID) | ORAL | 0 refills | Status: DC | PRN

## 2021-03-26 NOTE — ED Triage Notes (Signed)
C/O intermittent nausea.

## 2021-03-26 NOTE — ED Triage Notes (Signed)
Patient states yesterday she had pain in her mid to lower left side of her back.  Denies Meds  Denies injury

## 2021-03-26 NOTE — Discharge Instructions (Addendum)
-  Take the Zofran (ondansetron) up to 3 times daily for nausea and vomiting. Dissolve one pill under your tongue or between your teeth and your cheek. -Start the muscle relaxer-Zanaflex (tizanidine), up to 3 times daily for muscle spasms and pain.  This can make you drowsy, so take at bedtime or when you do not need to drive or operate machinery. -Heating pad  -Drink coffee only after eating -Limit ibuprofen/NSAIDs, spicy foods, alcohol, etc.

## 2021-03-26 NOTE — ED Provider Notes (Signed)
[facility]    CSN: [id number] Arrival date & time: 03/26/21  1140      History   Chief Complaint Chief Complaint  Patient presents with   Back Pain    Left side back pain and vomiting    HPI Erica Graves is a 56 y.o. female presenting with back and pain and nausea. History GERD. Denies history back issues.  -Describes L lower lumbar pain x1 day, worse with movement. Minimal pain at rest. Denies changes to routine, but does regularly do heavy lifting at work. Hasn't tried interventions at home yet. Denies pain shooting down legs, denies numbness in arms/legs, denies weakness in arms/legs, denies saddle anesthesia, denies bowel/bladder incontinence, denies urinary retention, denies constipation. Denies hematuria, dysuria, frequency, urgency, n/v/d/abd pain, fevers/chills, abdnormal vaginal discharge.  -Nausea without vomiting x12 hours. Denies abd pain, diarrhea. Attributes this to her reflux, takes protonix daily. Nausea occurred on an empty stomach.  HPI  Past Medical History:  Diagnosis Date   Carpal tunnel syndrome, bilateral    Chronic low back pain    Constipation    Counseling for estrogen replacement therapy 07/07/2014   Depression with anxiety    Dyspareunia 07/07/2014   GAD (generalized anxiety disorder) 04/07/2020   History of kidney stones    Hot flashes 07/07/2014   Hypertension    Menopause 07/07/2014   Moody 07/07/2014   Obesity    Umbilical hernia     Patient Active Problem List   Diagnosis Date Noted   Arthritis of carpometacarpal ([place]) joint of right thumb 10/07/2020   Radial styloid tenosynovitis (de quervain) 10/07/2020   Irritable mood 04/07/2020   GAD (generalized anxiety disorder) 04/07/2020   Vaginal itching 06/05/2019   Vaginal dryness 06/05/2019   Screening for colorectal cancer 06/05/2019   Screening for cholesterol level 06/05/2019   S/P trigger finger release 01/24/17 02/06/2017   Trigger finger of right thumb    Essential  hypertension, benign 11/18/2015   Gastroesophageal reflux disease without esophagitis 05/06/2015   Irritable bowel syndrome with constipation 05/06/2015   Hot flashes 07/07/2014   Dyspareunia 07/07/2014   Moody 07/07/2014   Menopause 07/07/2014   Counseling for estrogen replacement therapy 07/07/2014   Morbid obesity ([place]) 03/05/2014   Carpal tunnel syndrome of right wrist 12/22/2013   Vitamin D deficiency 10/14/2013   Migraine headache without aura 11/24/2012   Muscle contraction headache 11/24/2012   CTS (carpal tunnel syndrome) 08/14/2012   Stiffness of joint, not elsewhere classified, lower leg 04/22/2012   Weakness of left leg 04/22/2012   Difficulty walking 04/22/2012   S/P total knee replacement, left 03/31/12 04/03/2012   Arthritis of knee, degenerative 03/05/2012   Osteoarthritis of left knee 12/12/2011   S/P arthroscopy of left knee 06/13/2011   Lateral meniscus tear 06/13/2011   Stiffness of knee joint 06/05/2011   Medial meniscus, posterior horn derangement 05/16/2011   DEGENERATIVE JOINT DISEASE, KNEES, BILATERAL 12/19/2009   PATELLO-FEMORAL SYNDROME 12/19/2009   FOOT PAIN, LEFT 07/22/2007   DEPRESSION/ANXIETY 12/27/2006   SHOULDER PAIN, LEFT 12/27/2006   OVERWEIGHT 11/12/2006   CONSTIPATION 11/12/2006   Lumbago 11/12/2006    Past Surgical History:  Procedure Laterality Date   ABDOMINAL HYSTERECTOMY     BACK SURGERY     removal of cyst subdermal   CARPAL TUNNEL RELEASE Left 09/30/2014   Procedure: CARPAL TUNNEL RELEASE;  Surgeon: Carole Civil, MD;  Location: [facility];  Service: Orthopedics;  Laterality: Left;   CARPAL TUNNEL RELEASE Right 09/28/2015  Procedure: CARPAL TUNNEL RELEASE;  Surgeon: Carole Civil, MD;  Location: [facility];  Service: Orthopedics;  Laterality: Right;   COLONOSCOPY N/A 06/29/2013   Procedure: COLONOSCOPY;  Surgeon: Danie Binder, MD;  Location: [facility];  Service: Endoscopy;  Laterality: N/A;  1:30   KNEE ARTHROSCOPY Right     KNEE ARTHROSCOPY  05/21/2011   Procedure: ARTHROSCOPY KNEE;  Surgeon: Carole Civil, MD;  Location: [facility];  Service: Orthopedics;  Laterality: Left;  lateral menisectomy   KNEE CLOSED REDUCTION Left 05/26/2012   Procedure: CLOSED MANIPULATION KNEE;  Surgeon: Carole Civil, MD;  Location: [facility];  Service: Orthopedics;  Laterality: Left;   left knee arthroscopy  08/14/2005 Dr. Aline Brochure torn meniscus   LUMBAR SPINE SURGERY     PARTIAL HYSTERECTOMY     TOTAL KNEE ARTHROPLASTY  03/31/2012   Procedure: TOTAL KNEE ARTHROPLASTY;  Surgeon: Carole Civil, MD;  Location: [facility];  Service: Orthopedics;  Laterality: Left;  Left Total Knee Arthroplasty   TRIGGER FINGER RELEASE Right 01/24/2017   Procedure: RELEASE TRIGGER FINGER/A-1 PULLEY right thumb;  Surgeon: Carole Civil, MD;  Location: [facility];  Service: Orthopedics;  Laterality: Right;    OB History     Gravida  3   Para  3   Term      Preterm      AB      Living         SAB      IAB      Ectopic      Multiple      Live Births               Home Medications    Prior to Admission medications   Medication Sig Start Date End Date Taking? Authorizing Provider  ondansetron (ZOFRAN-ODT) 8 MG disintegrating tablet Take 1 tablet (8 mg total) by mouth every 8 (eight) hours as needed for nausea or vomiting. 03/26/21  Yes Hazel Sams, PA-C  tiZANidine (ZANAFLEX) 2 MG tablet Take 1 tablet (2 mg total) by mouth every 8 (eight) hours as needed for muscle spasms. 03/26/21  Yes Hazel Sams, PA-C  amLODipine (NORVASC) 5 MG tablet Take 1 tablet (5 mg total) by mouth daily. 04/07/20   Chalmers Guest, NP  cholecalciferol (VITAMIN D) 1000 units tablet Take 1,000 Units by mouth daily.    [provider]  pantoprazole (PROTONIX) 40 MG tablet TAKE 1 TABLET(40 MG) BY MOUTH DAILY 03/20/21   Kathyrn Drown, MD  sertraline (ZOLOFT) 50 MG tablet Take 1/2 tab po qd x 6 days then one po qd 03/17/21   Nilda Simmer,  NP    Family History Family History  Problem Relation Age of Onset   Heart disease Mother    Hypertension Mother    Colon cancer Neg Hx     Social History Social History   Tobacco Use   Smoking status: Never   Smokeless tobacco: Never  Vaping Use   Vaping Use: Never used  Substance Use Topics   Alcohol use: Yes    Comment: occ   Drug use: No     Allergies   Norco [hydrocodone-acetaminophen]   Review of Systems Review of Systems  Constitutional:  Negative for appetite change, chills, diaphoresis, fever and unexpected weight change.  HENT:  Negative for congestion, ear pain, sinus pressure, sinus pain, sneezing, sore throat and trouble swallowing.   Respiratory:  Negative for cough, chest tightness and shortness of  breath.   Cardiovascular:  Negative for chest pain.  Gastrointestinal:  Positive for abdominal pain and nausea. Negative for abdominal distention, anal bleeding, blood in stool, constipation, diarrhea, rectal pain and vomiting.  Genitourinary:  Negative for dysuria, flank pain, frequency and urgency.  Musculoskeletal:  Positive for back pain. Negative for myalgias.  Neurological:  Negative for dizziness, light-headedness and headaches.    Physical Exam Triage Vital Signs ED Triage Vitals  Enc Vitals Group     BP 03/26/21 1309 140/84     Pulse Rate 03/26/21 1309 62     Resp 03/26/21 1309 16     Temp 03/26/21 1309 98.3 F (36.8 C)     Temp Source 03/26/21 1309 Oral     SpO2 03/26/21 1309 96 %     Weight --      Height --      Head Circumference --      Peak Flow --      Pain Score 03/26/21 1306 9     Pain Loc --      Pain Edu? --      Excl. in GC? --    No data found.  Updated Vital Signs BP 140/84 (BP Location: Right Arm)    Pulse 62    Temp 98.3 F (36.8 C) (Oral)    Resp 16    SpO2 96%   Visual Acuity Right Eye Distance:   Left Eye Distance:   Bilateral Distance:    Right Eye Near:   Left Eye Near:    Bilateral Near:     Physical  Exam Vitals reviewed.  Constitutional:      General: She is not in acute distress.    Appearance: Normal appearance. She is not ill-appearing.  HENT:     Head: Normocephalic and atraumatic.  Cardiovascular:     Rate and Rhythm: Normal rate and regular rhythm.     Heart sounds: Normal heart sounds.  Pulmonary:     Effort: Pulmonary effort is normal.     Breath sounds: Normal breath sounds and air entry.  Abdominal:     Tenderness: There is abdominal tenderness in the epigastric area. There is no right CVA tenderness, left CVA tenderness, guarding or rebound.     Comments: Epigastric pain to palpation without guarding or rebound.  Comfortable throughout exam  Musculoskeletal:     Cervical back: Normal range of motion. No swelling, deformity, signs of trauma, rigidity, spasms, tenderness, bony tenderness or crepitus. No pain with movement.     Thoracic back: No swelling, deformity, signs of trauma, spasms, tenderness or bony tenderness. Normal range of motion. No scoliosis.     Lumbar back: Spasms and tenderness present. No swelling, deformity, signs of trauma or bony tenderness. Normal range of motion. Negative right straight leg raise test and negative left straight leg raise test. No scoliosis.     Comments: Left-sided lumbar paraspinous muscle tenderness to palpation.  Pain elicited with standing. No midline spinous tenderness, deformity, stepoff.  Absolutely no other injury, deformity, tenderness, ecchymosis, abrasion.  Neurological:     General: No focal deficit present.     Mental Status: She is alert.     Cranial Nerves: No cranial nerve deficit.  Psychiatric:        Mood and Affect: Mood normal.        Behavior: Behavior normal.        Thought Content: Thought content normal.        Judgment: Judgment normal.  UC Treatments / Results  Labs (all labs ordered are listed, but only abnormal results are displayed) Labs Reviewed  POCT URINALYSIS DIP (MANUAL ENTRY) -  Abnormal; Notable for the following components:      Result Value   Spec Grav, UA >=1.030 (*)    All other components within normal limits    EKG   Radiology No results found.  Procedures Procedures (including critical care time)  Medications Ordered in UC Medications  ondansetron (ZOFRAN-ODT) disintegrating tablet 4 mg (4 mg Oral Given 03/26/21 1337)    Initial Impression / Assessment and Plan / UC Course  I have reviewed the triage vital signs and the nursing notes.  Pertinent labs & imaging results that were available during my care of the patient were reviewed by me and considered in my medical decision making (see chart for details).     This patient is a very pleasant 56 y.o. year old female presenting with lumbar strain and GERD.  Zofran administered during visit with resolution of nausea.  No urinary symptoms.  UA wnl, did not send culture.  Attribute nausea to GERD, continue Protonix.  Zofran sent.  Stop drinking coffee on an empty stomach.  Zanaflex sent for lumbar strain.  ED return precautions discussed. Patient verbalizes understanding and agreement.    Final Clinical Impressions(s) / UC Diagnoses   Final diagnoses:  Strain of lumbar region, initial encounter  Gastroesophageal reflux disease without esophagitis  Nausea without vomiting     Discharge Instructions      -Take the Zofran (ondansetron) up to 3 times daily for nausea and vomiting. Dissolve one pill under your tongue or between your teeth and your cheek. -Start the muscle relaxer-Zanaflex (tizanidine), up to 3 times daily for muscle spasms and pain.  This can make you drowsy, so take at bedtime or when you do not need to drive or operate machinery. -Heating pad  -Drink coffee only after eating -Limit ibuprofen/NSAIDs, spicy foods, alcohol, etc.       ED Prescriptions     Medication Sig Dispense Auth. Provider   ondansetron (ZOFRAN-ODT) 8 MG disintegrating tablet Take 1 tablet (8 mg  total) by mouth every 8 (eight) hours as needed for nausea or vomiting. 20 tablet Hazel Sams, PA-C   tiZANidine (ZANAFLEX) 2 MG tablet Take 1 tablet (2 mg total) by mouth every 8 (eight) hours as needed for muscle spasms. 21 tablet Hazel Sams, PA-C      PDMP not reviewed this encounter.   Hazel Sams, PA-C 03/26/21 1419

## 2021-04-21 ENCOUNTER — Ambulatory Visit (INDEPENDENT_AMBULATORY_CARE_PROVIDER_SITE_OTHER): Payer: BC Managed Care – PPO | Admitting: Nurse Practitioner

## 2021-04-21 ENCOUNTER — Other Ambulatory Visit: Payer: Self-pay

## 2021-04-21 DIAGNOSIS — K219 Gastro-esophageal reflux disease without esophagitis: Secondary | ICD-10-CM | POA: Diagnosis not present

## 2021-04-21 DIAGNOSIS — F341 Dysthymic disorder: Secondary | ICD-10-CM

## 2021-04-21 DIAGNOSIS — K581 Irritable bowel syndrome with constipation: Secondary | ICD-10-CM

## 2021-04-21 MED ORDER — SERTRALINE HCL 50 MG PO TABS: ORAL_TABLET | ORAL | 0 refills | Status: DC

## 2021-04-21 NOTE — Progress Notes (Signed)
° °  Subjective:    Patient ID: Erica Graves, female    DOB: 1965-07-18, 56 y.o.   MRN: [id number]  HPI Follow up anxiety and GERD   Review of Systems     Objective:   Physical Exam        Assessment & Plan:

## 2021-04-23 ENCOUNTER — Encounter: Payer: Self-pay | Admitting: Nurse Practitioner

## 2021-04-23 NOTE — Progress Notes (Signed)
Patient ID: Erica Graves, female   DOB: 1966/02/09, 56 y.o.   MRN: [id number] Virtual Visit via Telephone Note  I connected with Junie Panning on 04/23/21 at  3:40 PM EST by telephone and verified that I am speaking with the correct person using two identifiers.  Location: Patient: home Provider: office   I discussed the limitations, risks, security and privacy concerns of performing an evaluation and management service by telephone and the availability of in person appointments. I also discussed with the patient that there may be a patient responsible charge related to this service. The patient expressed understanding and agreed to proceed.   History of Present Illness: Presents for recheck on her GERD and depression/anxiety symptoms.  See previous note.  Reflux symptoms are completely controlled with use of pantoprazole.  States she is doing very well on her current dose of sertraline.  Denies any adverse effects at this time.  Defers increasing the medication at this point.  Denies suicidal thoughts or ideation. Has minimal abdominal cramping.  No nausea or vomiting.  Has had some issues with her IBS constipation.  No bloody stools.  Last colonoscopy was in 2015.   Observations/Objective: Today's visit was via telephone Physical exam was not possible for this visit Alert, oriented.  Speech clear.  Calm affect.  Assessment and Plan: Problem List Items Addressed This Visit       Digestive   Gastroesophageal reflux disease without esophagitis   Irritable bowel syndrome with constipation     Other   DEPRESSION/ANXIETY - Primary   Relevant Medications   sertraline (ZOLOFT) 50 MG tablet   Meds ordered this encounter  Medications   sertraline (ZOLOFT) 50 MG tablet    Sig: Take one tab po qd    Dispense:  90 tablet    Refill:  0    Order Specific Question:   Supervising Provider    Answer:   Sallee Lange A [[id number]]     Follow Up Instructions: Continue current medications as  directed.  Recommend OTC MiraLAX as directed for constipation.  Recommend daily use and if stools are improved, cut back on dosing.  Call back if no improvement. Continue Zoloft at current dose, call back before next visit if she wishes to increase the dosage.   I discussed the assessment and treatment plan with the patient. The patient was provided an opportunity to ask questions and all were answered. The patient agreed with the plan and demonstrated an understanding of the instructions.   The patient was advised to call back or seek an in-person evaluation if the symptoms worsen or if the condition fails to improve as anticipated.  I provided 15 minutes of non-face-to-face time during this encounter.   Nilda Simmer, NP

## 2021-05-04 ENCOUNTER — Other Ambulatory Visit (HOSPITAL_COMMUNITY): Payer: Self-pay | Admitting: Family Medicine

## 2021-05-04 DIAGNOSIS — Z1231 Encounter for screening mammogram for malignant neoplasm of breast: Secondary | ICD-10-CM

## 2021-05-10 ENCOUNTER — Other Ambulatory Visit: Payer: Self-pay

## 2021-05-10 ENCOUNTER — Ambulatory Visit (HOSPITAL_COMMUNITY)
Admission: RE | Admit: 2021-05-10 | Discharge: 2021-05-10 | Disposition: A | Discharge status: Home / Self Care | Payer: BC Managed Care – PPO | Source: Ambulatory Visit | Attending: Family Medicine | Admitting: Family Medicine

## 2021-05-10 DIAGNOSIS — Z1231 Encounter for screening mammogram for malignant neoplasm of breast: Secondary | ICD-10-CM | POA: Insufficient documentation

## 2021-05-15 ENCOUNTER — Other Ambulatory Visit (HOSPITAL_COMMUNITY): Payer: Self-pay | Admitting: Family Medicine

## 2021-05-15 ENCOUNTER — Other Ambulatory Visit (HOSPITAL_COMMUNITY): Payer: Self-pay | Admitting: General Practice

## 2021-05-15 DIAGNOSIS — R928 Other abnormal and inconclusive findings on diagnostic imaging of breast: Secondary | ICD-10-CM

## 2021-06-02 LAB — CBC WITH DIFFERENTIAL/PLATELET
Basophils Absolute: 0 10*3/uL (ref 0.0–0.2)
Basos: 1 %
EOS (ABSOLUTE): 0.2 10*3/uL (ref 0.0–0.4)
Eos: 4 %
Hematocrit: 43.3 % (ref 34.0–46.6)
Hemoglobin: 14.4 g/dL (ref 11.1–15.9)
Immature Grans (Abs): 0 10*3/uL (ref 0.0–0.1)
Immature Granulocytes: 0 %
Lymphocytes Absolute: 1.9 10*3/uL (ref 0.7–3.1)
Lymphs: 49 %
MCH: 29.3 pg (ref 26.6–33.0)
MCHC: 33.3 g/dL (ref 31.5–35.7)
MCV: 88 fL (ref 79–97)
Monocytes Absolute: 0.4 10*3/uL (ref 0.1–0.9)
Monocytes: 10 %
Neutrophils Absolute: 1.4 10*3/uL (ref 1.4–7.0)
Neutrophils: 36 %
Platelets: 201 10*3/uL (ref 150–450)
RBC: 4.91 x10E6/uL (ref 3.77–5.28)
RDW: 12 % (ref 11.7–15.4)
WBC: 3.9 10*3/uL (ref 3.4–10.8)

## 2021-06-02 LAB — TSH: TSH: 1.3 u[IU]/mL (ref 0.450–4.500)

## 2021-06-02 LAB — LIPID PANEL
Chol/HDL Ratio: 4 ratio (ref 0.0–4.4)
Cholesterol, Total: 177 mg/dL (ref 100–199)
HDL: 44 mg/dL (ref >39)
LDL Chol Calc (NIH): 115 mg/dL — ABNORMAL HIGH (ref 0–99)
Triglycerides: 96 mg/dL (ref 0–149)
VLDL Cholesterol Cal: 18 mg/dL (ref 5–40)

## 2021-06-13 ENCOUNTER — Ambulatory Visit (HOSPITAL_COMMUNITY)
Admission: RE | Admit: 2021-06-13 | Discharge: 2021-06-13 | Disposition: A | Discharge status: Home / Self Care | Payer: BC Managed Care – PPO | Source: Ambulatory Visit | Attending: Family Medicine | Admitting: Family Medicine

## 2021-06-13 DIAGNOSIS — R928 Other abnormal and inconclusive findings on diagnostic imaging of breast: Secondary | ICD-10-CM | POA: Diagnosis present

## 2021-06-26 ENCOUNTER — Other Ambulatory Visit: Payer: Self-pay

## 2021-06-26 DIAGNOSIS — I10 Essential (primary) hypertension: Secondary | ICD-10-CM

## 2021-06-26 MED ORDER — AMLODIPINE BESYLATE 5 MG PO TABS: 5.0000 mg | ORAL_TABLET | Freq: Every day | ORAL | 0 refills | Status: DC

## 2021-07-04 ENCOUNTER — Ambulatory Visit
Admission: EM | Admit: 2021-07-04 | Discharge: 2021-07-04 | Disposition: A | Discharge status: Home / Self Care | Payer: BC Managed Care – PPO | Attending: Nurse Practitioner | Admitting: Nurse Practitioner

## 2021-07-04 ENCOUNTER — Ambulatory Visit (INDEPENDENT_AMBULATORY_CARE_PROVIDER_SITE_OTHER): Payer: BC Managed Care – PPO

## 2021-07-04 ENCOUNTER — Encounter: Payer: Self-pay | Admitting: Emergency Medicine

## 2021-07-04 DIAGNOSIS — M25531 Pain in right wrist: Secondary | ICD-10-CM | POA: Diagnosis not present

## 2021-07-04 DIAGNOSIS — M19031 Primary osteoarthritis, right wrist: Secondary | ICD-10-CM

## 2021-07-04 MED ORDER — MELOXICAM 5 MG PO CAPS: 5.0000 mg | ORAL_CAPSULE | Freq: Every day | ORAL | 0 refills | Status: DC

## 2021-07-04 MED ORDER — PREDNISONE 20 MG PO TABS: 40.0000 mg | ORAL_TABLET | Freq: Every day | ORAL | 0 refills | Status: AC

## 2021-07-04 NOTE — ED Provider Notes (Signed)
[facility]    CSN: [id number] Arrival date & time: 07/04/21  1559      History   Chief Complaint No chief complaint on file.   HPI Erica Graves is a 56 y.o. female.   The patient is a 56 year old female who presents with complaints of right wrist pain.  States symptoms have been present for the past week.  She denies any known injury or trauma.  She reports pain is in the entire wrist joint.  She also has pain and swelling the eights into the forearm.  Pain is aggravated by certain movement and lifting.  She denies numbness or tingling to the right wrist and forearm.  She reports that she has been using ibuprofen for her pain.  The history is provided by the patient.   Past Medical History:  Diagnosis Date   Carpal tunnel syndrome, bilateral    Chronic low back pain    Constipation    Counseling for estrogen replacement therapy 07/07/2014   Depression with anxiety    Dyspareunia 07/07/2014   GAD (generalized anxiety disorder) 04/07/2020   History of kidney stones    Hot flashes 07/07/2014   Hypertension    Menopause 07/07/2014   Moody 07/07/2014   Obesity    Umbilical hernia     Patient Active Problem List   Diagnosis Date Noted   Arthritis of carpometacarpal (CMC) joint of right thumb 10/07/2020   Radial styloid tenosynovitis (de quervain) 10/07/2020   Irritable mood 04/07/2020   GAD (generalized anxiety disorder) 04/07/2020   Vaginal itching 06/05/2019   Vaginal dryness 06/05/2019   Screening for colorectal cancer 06/05/2019   Screening for cholesterol level 06/05/2019   S/P trigger finger release 01/24/17 02/06/2017   Trigger finger of right thumb    Essential hypertension, benign 11/18/2015   Gastroesophageal reflux disease without esophagitis 05/06/2015   Irritable bowel syndrome with constipation 05/06/2015   Hot flashes 07/07/2014   Dyspareunia 07/07/2014   Moody 07/07/2014   Menopause 07/07/2014   Counseling for estrogen replacement therapy  07/07/2014   Morbid obesity (HCC) 03/05/2014   Carpal tunnel syndrome of right wrist 12/22/2013   Vitamin D deficiency 10/14/2013   Migraine headache without aura 11/24/2012   Muscle contraction headache 11/24/2012   CTS (carpal tunnel syndrome) 08/14/2012   Stiffness of joint, not elsewhere classified, lower leg 04/22/2012   Weakness of left leg 04/22/2012   Difficulty walking 04/22/2012   S/P total knee replacement, left 03/31/12 04/03/2012   Arthritis of knee, degenerative 03/05/2012   Osteoarthritis of left knee 12/12/2011   S/P arthroscopy of left knee 06/13/2011   Lateral meniscus tear 06/13/2011   Stiffness of knee joint 06/05/2011   Medial meniscus, posterior horn derangement 05/16/2011   DEGENERATIVE JOINT DISEASE, KNEES, BILATERAL 12/19/2009   PATELLO-FEMORAL SYNDROME 12/19/2009   FOOT PAIN, LEFT 07/22/2007   DEPRESSION/ANXIETY 12/27/2006   SHOULDER PAIN, LEFT 12/27/2006   OVERWEIGHT 11/12/2006   CONSTIPATION 11/12/2006   Lumbago 11/12/2006    Past Surgical History:  Procedure Laterality Date   ABDOMINAL HYSTERECTOMY     BACK SURGERY     removal of cyst subdermal   CARPAL TUNNEL RELEASE Left 09/30/2014   Procedure: CARPAL TUNNEL RELEASE;  Surgeon: Vickki Hearing, MD;  Location: [facility];  Service: Orthopedics;  Laterality: Left;   CARPAL TUNNEL RELEASE Right 09/28/2015   Procedure: CARPAL TUNNEL RELEASE;  Surgeon: Vickki Hearing, MD;  Location: [facility];  Service: Orthopedics;  Laterality: Right;   COLONOSCOPY N/A  06/29/2013   Procedure: COLONOSCOPY;  Surgeon: West Bali, MD;  [facility]: [facility];  Service: Endoscopy;  Laterality: N/A;  1:30   KNEE ARTHROSCOPY Right    KNEE ARTHROSCOPY  05/21/2011   Procedure: ARTHROSCOPY KNEE;  Surgeon: Vickki Hearing, MD;  Location: [facility];  Service: Orthopedics;  Laterality: Left;  lateral menisectomy   KNEE CLOSED REDUCTION Left 05/26/2012   Procedure: CLOSED MANIPULATION KNEE;  Surgeon: Vickki Hearing, MD;  Location:  [facility];  Service: Orthopedics;  Laterality: Left;   left knee arthroscopy  08/14/2005 Dr. Romeo Apple torn meniscus   LUMBAR SPINE SURGERY     PARTIAL HYSTERECTOMY     TOTAL KNEE ARTHROPLASTY  03/31/2012   Procedure: TOTAL KNEE ARTHROPLASTY;  Surgeon: Vickki Hearing, MD;  Location: [facility];  Service: Orthopedics;  Laterality: Left;  Left Total Knee Arthroplasty   TRIGGER FINGER RELEASE Right 01/24/2017   Procedure: RELEASE TRIGGER FINGER/A-1 PULLEY right thumb;  Surgeon: Vickki Hearing, MD;  Location: [facility];  Service: Orthopedics;  Laterality: Right;    OB History     Gravida  3   Para  3   Term      Preterm      AB      Living         SAB      IAB      Ectopic      Multiple      Live Births               Home Medications    Prior to Admission medications   Medication Sig Start Date End Date Taking? Authorizing Provider  Meloxicam 5 MG CAPS Take 5 mg by mouth daily. 07/04/21  Yes Leath-Warren, Sadie Haber, NP  predniSONE (DELTASONE) 20 MG tablet Take 2 tablets (40 mg total) by mouth daily with breakfast for 5 days. 07/04/21 07/09/21 Yes Leath-Warren, Sadie Haber, NP  amLODipine (NORVASC) 5 MG tablet Take 1 tablet (5 mg total) by mouth daily. 06/26/21   Babs Sciara, MD  cholecalciferol (VITAMIN D) 1000 units tablet Take 1,000 Units by mouth daily.    [provider]  ondansetron (ZOFRAN-ODT) 8 MG disintegrating tablet Take 1 tablet (8 mg total) by mouth every 8 (eight) hours as needed for nausea or vomiting. 03/26/21   Rhys Martini, PA-C  pantoprazole (PROTONIX) 40 MG tablet TAKE 1 TABLET(40 MG) BY MOUTH DAILY 03/20/21   Babs Sciara, MD  sertraline (ZOLOFT) 50 MG tablet Take one tab po qd 04/21/21   Campbell Riches, NP  tiZANidine (ZANAFLEX) 2 MG tablet Take 1 tablet (2 mg total) by mouth every 8 (eight) hours as needed for muscle spasms. 03/26/21   Rhys Martini, PA-C    Family History Family History  Problem Relation Age of Onset   Heart  disease Mother    Hypertension Mother    Colon cancer Neg Hx     Social History Social History   Tobacco Use   Smoking status: Never   Smokeless tobacco: Never  Vaping Use   Vaping Use: Never used  Substance Use Topics   Alcohol use: Yes    Comment: occ   Drug use: No     Allergies   Norco [hydrocodone-acetaminophen]   Review of Systems Review of Systems  Constitutional: Negative.   Musculoskeletal:        Right wrist pain  Skin: Negative.   Psychiatric/Behavioral: Negative.      Physical Exam  Triage Vital Signs ED Triage Vitals  Enc Vitals Group     BP 07/04/21 1641 (!) 160/83     Pulse Rate 07/04/21 1641 68     Resp 07/04/21 1641 18     Temp 07/04/21 1641 98.6 F (37 C)     Temp Source 07/04/21 1641 Oral     SpO2 07/04/21 1641 94 %     Weight --      Height --      Head Circumference --      Peak Flow --      Pain Score 07/04/21 1643 8     Pain Loc --      Pain Edu? --      Excl. in GC? --    No data found.  Updated Vital Signs BP (!) 160/83 (BP Location: Left Arm)   Pulse 68   Temp 98.6 F (37 C) (Oral)   Resp 18   SpO2 94%   Visual Acuity Right Eye Distance:   Left Eye Distance:   Bilateral Distance:    Right Eye Near:   Left Eye Near:    Bilateral Near:     Physical Exam Vitals and nursing note reviewed.  Constitutional:      Appearance: Normal appearance.  Musculoskeletal:     Right wrist: Swelling and tenderness present. Decreased range of motion.     Right hand: Normal.  Skin:    General: Skin is warm and dry.     Capillary Refill: Capillary refill takes less than 2 seconds.  Neurological:     General: No focal deficit present.     Mental Status: She is alert and oriented to person, place, and time.  Psychiatric:        Mood and Affect: Mood normal.        Behavior: Behavior normal.     UC Treatments / Results  Labs (all labs ordered are listed, but only abnormal results are displayed) Labs Reviewed - No data to  display  EKG   Radiology DG Wrist Complete Right  Result Date: 07/04/2021 CLINICAL DATA:  Right-sided wrist pain EXAM: RIGHT WRIST - COMPLETE 3+ VIEW COMPARISON:  12/28/2019 FINDINGS: No fracture or malalignment. Moderate degenerative changes at the first [facility] joint. Soft tissues are unremarkable IMPRESSION: Arthritis at the first [facility] joint Electronically Signed   By: Jasmine Pang M.D.   On: 07/04/2021 16:53    Procedures Procedures (including critical care time)  Medications Ordered in UC Medications - No data to display  Initial Impression / Assessment and Plan / UC Course  I have reviewed the triage vital signs and the nursing notes.  Pertinent labs & imaging results that were available during my care of the patient were reviewed by me and considered in my medical decision making (see chart for details).  The patient is a 56 year old female who presents for right wrist pain.  Patient denies any known injury or trauma.  Symptoms have been worsening over the past week since her symptoms started.  On exam, she has moderate tenderness to the right wrist in all planes.  She also has some swelling in the ulnar aspect of the right forearm.  X-rays are negative for any fracture or dislocation.  Symptoms are consistent with arthritis, which is indicated on her x-ray at the first [facility] joint.  We will start the patient on prednisone for 5 days, then switch her to meloxicam.  Patient was advised that this most likely is a chronic condition  and she will need to follow-up with her PCP for further evaluation and continued treatment.  A wrist brace has been provided for the patient in the interim.  Patient advised to use this with strenuous activity.  Patient can also attempt the use of ice to see if this helps with her symptoms. Final Clinical Impressions(s) / UC Diagnoses   Final diagnoses:  Arthritis of right wrist     Discharge Instructions      Your x-rays are negative for fracture today.   Symptoms are consistent with arthritis of the wrist. As discussed, I am prescribing prednisone to see if this helps with any inflammation of your symptoms.  Take this medication with Tylenol only.  When she complete the prednisone, you can start using the meloxicam.  Take medication daily.  Take the medication with food and water.   May apply ice to the right wrist as needed for pain or swelling. Use the brace to help with any strenuous activity or heavy lifting. If symptoms do not improve, recommend follow-up with PCP or orthopedics for further evaluation.     ED Prescriptions     Medication Sig Dispense Auth. Provider   predniSONE (DELTASONE) 20 MG tablet Take 2 tablets (40 mg total) by mouth daily with breakfast for 5 days. 10 tablet Leath-Warren, Sadie Haber, NP   Meloxicam 5 MG CAPS Take 5 mg by mouth daily. 30 capsule Leath-Warren, Sadie Haber, NP      PDMP not reviewed this encounter.   Abran Cantor, NP 07/04/21 1718

## 2021-07-04 NOTE — ED Triage Notes (Signed)
Right wrist pain since yesterday.  Difficulty picking up things and writing.  No known injury ?

## 2021-07-04 NOTE — Discharge Instructions (Addendum)
Your x-rays are negative for fracture today.  Symptoms are consistent with arthritis of the wrist. ?As discussed, I am prescribing prednisone to see if this helps with any inflammation of your symptoms.  Take this medication with Tylenol only.  When she complete the prednisone, you can start using the meloxicam.  Take medication daily.  Take the medication with food and water.   ?May apply ice to the right wrist as needed for pain or swelling. ?Use the brace to help with any strenuous activity or heavy lifting. ?If symptoms do not improve, recommend follow-up with PCP or orthopedics for further evaluation. ?

## 2021-07-06 ENCOUNTER — Ambulatory Visit: Payer: BC Managed Care – PPO | Admitting: Orthopedic Surgery

## 2021-09-11 ENCOUNTER — Ambulatory Visit: Payer: BC Managed Care – PPO | Admitting: Orthopedic Surgery

## 2021-09-11 ENCOUNTER — Encounter: Payer: Self-pay | Admitting: Orthopedic Surgery

## 2021-09-11 DIAGNOSIS — M25561 Pain in right knee: Secondary | ICD-10-CM | POA: Diagnosis not present

## 2021-09-11 DIAGNOSIS — M5431 Sciatica, right side: Secondary | ICD-10-CM

## 2021-09-11 DIAGNOSIS — G8929 Other chronic pain: Secondary | ICD-10-CM

## 2021-09-11 MED ORDER — GABAPENTIN 300 MG PO CAPS: 300.0000 mg | ORAL_CAPSULE | Freq: Every day | ORAL | 1 refills | Status: DC

## 2021-09-11 MED ORDER — METHYLPREDNISOLONE ACETATE 40 MG/ML IJ SUSP
40.0000 mg | Freq: Once | INTRAMUSCULAR | Status: AC
Start: 2021-09-11 — End: 2021-09-11
  Administered 2021-09-11: 40 mg via INTRA_ARTICULAR

## 2021-09-11 MED ORDER — PREDNISONE 10 MG (48) PO TBPK: ORAL_TABLET | Freq: Every day | ORAL | 0 refills | Status: DC

## 2021-09-11 NOTE — Patient Instructions (Signed)

## 2021-09-11 NOTE — Progress Notes (Signed)
Chief Complaint  Patient presents with   Knee Pain    Right/ painful injection needed if possible    Chronic right knee pain worse patient complains of pain medial side right knee some lateral pain  New complaint pain right leg pain radiates down the lateral side of the right leg to the dorsum of the foot  On clinical exam this pain is palpable from the top of the foot along the lateral compartment musculature off the side of the leg into the lower back and hip consistent with sciatica  Recommend injection for the chronic right knee pain  Recommend Neurontin and prednisone for the sciatica  Follow-up in as-needed basis  Encounter Diagnoses  Name Primary?   Sciatica, right side Yes   Chronic pain of right knee    Procedure note right knee injection   verbal consent was obtained to inject right knee joint  Timeout was completed to confirm the site of injection  The medications used were depomedrol 40 mg and 1% lidocaine 3 cc Anesthesia was provided by ethyl chloride and the skin was prepped with alcohol.  After cleaning the skin with alcohol a 20-gauge needle was used to inject the right knee joint. There were no complications. A sterile bandage was applied.

## 2021-10-11 ENCOUNTER — Other Ambulatory Visit: Payer: Self-pay | Admitting: Nurse Practitioner

## 2021-11-10 ENCOUNTER — Ambulatory Visit (INDEPENDENT_AMBULATORY_CARE_PROVIDER_SITE_OTHER): Payer: BC Managed Care – PPO | Admitting: Nurse Practitioner

## 2021-11-10 VITALS — BP 136/86 | Ht 65.0 in | Wt 209.0 lb

## 2021-11-10 DIAGNOSIS — K5904 Chronic idiopathic constipation: Secondary | ICD-10-CM

## 2021-11-10 DIAGNOSIS — I10 Essential (primary) hypertension: Secondary | ICD-10-CM | POA: Diagnosis not present

## 2021-11-10 DIAGNOSIS — K219 Gastro-esophageal reflux disease without esophagitis: Secondary | ICD-10-CM | POA: Diagnosis not present

## 2021-11-10 DIAGNOSIS — Z23 Encounter for immunization: Secondary | ICD-10-CM

## 2021-11-10 DIAGNOSIS — F411 Generalized anxiety disorder: Secondary | ICD-10-CM

## 2021-11-10 MED ORDER — SERTRALINE HCL 50 MG PO TABS: ORAL_TABLET | ORAL | 0 refills | Status: DC

## 2021-11-10 MED ORDER — LINACLOTIDE 145 MCG PO CAPS: 145.0000 ug | ORAL_CAPSULE | Freq: Every day | ORAL | 2 refills | Status: DC

## 2021-11-10 MED ORDER — AMLODIPINE BESYLATE 5 MG PO TABS: 5.0000 mg | ORAL_TABLET | Freq: Every day | ORAL | 0 refills | Status: DC

## 2021-11-10 NOTE — Progress Notes (Unsigned)
   Subjective:    Patient ID: Erica Graves, female    DOB: 05-Nov-1965, 56 y.o.   MRN: [id number]  Hypertension This is a chronic problem. The current episode started more than 1 year ago. Risk factors for coronary artery disease include post-menopausal state. Treatments tried: norvasc.   Patient reports feeling tired and fatigued   Review of Systems     Objective:   Physical Exam        Assessment & Plan:

## 2021-11-10 NOTE — Patient Instructions (Signed)

## 2021-11-11 ENCOUNTER — Encounter: Payer: Self-pay | Admitting: Nurse Practitioner

## 2021-11-14 ENCOUNTER — Encounter: Payer: Self-pay | Admitting: *Deleted

## 2021-11-22 NOTE — Progress Notes (Signed)
A user error has taken place: encounter opened in error, closed for administrative reasons.

## 2021-12-04 ENCOUNTER — Ambulatory Visit: Payer: BC Managed Care – PPO | Admitting: Gastroenterology

## 2021-12-11 ENCOUNTER — Encounter: Payer: Self-pay | Admitting: *Deleted

## 2021-12-11 ENCOUNTER — Encounter: Payer: Self-pay | Admitting: Gastroenterology

## 2021-12-11 ENCOUNTER — Ambulatory Visit: Payer: BC Managed Care – PPO | Admitting: Gastroenterology

## 2021-12-11 VITALS — BP 148/84 | HR 74 | Temp 98.1°F | Ht 65.0 in | Wt 207.2 lb

## 2021-12-11 DIAGNOSIS — R1013 Epigastric pain: Secondary | ICD-10-CM | POA: Diagnosis not present

## 2021-12-11 DIAGNOSIS — K219 Gastro-esophageal reflux disease without esophagitis: Secondary | ICD-10-CM

## 2021-12-11 DIAGNOSIS — K5904 Chronic idiopathic constipation: Secondary | ICD-10-CM

## 2021-12-11 MED ORDER — LUBIPROSTONE 24 MCG PO CAPS: 24.0000 ug | ORAL_CAPSULE | Freq: Two times a day (BID) | ORAL | 5 refills | Status: DC

## 2021-12-11 MED ORDER — PANTOPRAZOLE SODIUM 40 MG PO TBEC: 40.0000 mg | DELAYED_RELEASE_TABLET | Freq: Every day | ORAL | 3 refills | Status: DC

## 2021-12-11 NOTE — H&P (View-Only) (Signed)
GI Office Note    Referring Provider: Pearson Forster NP Primary Care Physician:  Kathyrn Drown, MD  Primary Gastroenterologist: formerly Dr. Oneida Alar  Chief Complaint   Chief Complaint  Patient presents with   Gastroesophageal Reflux   Constipation     History of Present Illness   Erica Graves is a 56 y.o. female presenting today at the request of Pearson Forster NP for further evaluation of GERD, epigastric pain, chronic constipation.   Patient complains of chronic constipation. BMs daily, but tiny and not productive. Sometimes takes bisacodyl but not regularly. Has tried miralax in the past, worked okay but she did not take regularly. Never got the Linzess prescribed by PCP recently. She is not sure if there was a coverage issue. No melena, heartburn.   Over the past several months she has had heartburn. On medications for a few months. Taking pantoprazole 40mg  daily. No dysphagia.  If skips dose then symptoms return. Denies abdominal pain. Weight stable. She was on ASA powders up until a couple of months ago due to knee pain. Now on mobic as needed. Also takes ibuprofen as well as needed.    Colonoscopy May 2015: 1.Normal mucosa in the terminal ileum 2. Mild diverticulosis in the transverse colon, descending colon, and sigmoid colon 3. The LEFT colon IS SLIGHTLY redundant 4. Small internal hemorrhoids  Medications   Current Outpatient Medications  Medication Sig Dispense Refill   amLODipine (NORVASC) 5 MG tablet Take 1 tablet (5 mg total) by mouth daily. 90 tablet 0   Meloxicam 5 MG CAPS Take 5 mg by mouth daily. 30 capsule 0   ondansetron (ZOFRAN-ODT) 8 MG disintegrating tablet Take 1 tablet (8 mg total) by mouth every 8 (eight) hours as needed for nausea or vomiting. 20 tablet 0   pantoprazole (PROTONIX) 40 MG tablet TAKE 1 TABLET(40 MG) BY MOUTH DAILY 90 tablet 0   sertraline (ZOLOFT) 50 MG tablet Take one tab po qd 90 tablet 0   No current  facility-administered medications for this visit.    Allergies   Allergies as of 12/11/2021 - Review Complete 12/11/2021  Allergen Reaction Noted   Norco [hydrocodone-acetaminophen] Nausea And Vomiting 09/30/2014    Past Medical History   Past Medical History:  Diagnosis Date   Carpal tunnel syndrome, bilateral    Chronic low back pain    Constipation    Counseling for estrogen replacement therapy 07/07/2014   Depression with anxiety    Dyspareunia 07/07/2014   GAD (generalized anxiety disorder) 04/07/2020   History of kidney stones    Hot flashes 07/07/2014   Hypertension    Menopause 07/07/2014   Moody 07/07/2014   Obesity    Umbilical hernia     Past Surgical History   Past Surgical History:  Procedure Laterality Date   ABDOMINAL HYSTERECTOMY     BACK SURGERY     removal of cyst subdermal   CARPAL TUNNEL RELEASE Left 09/30/2014   Procedure: CARPAL TUNNEL RELEASE;  Surgeon: Carole Civil, MD;  Location: [facility];  Service: Orthopedics;  Laterality: Left;   CARPAL TUNNEL RELEASE Right 09/28/2015   Procedure: CARPAL TUNNEL RELEASE;  Surgeon: Carole Civil, MD;  Location: [facility];  Service: Orthopedics;  Laterality: Right;   COLONOSCOPY N/A 06/29/2013   Procedure: COLONOSCOPY;  Surgeon: Danie Binder, MD;  [facility];  Service: Endoscopy;  Laterality: N/A;  1:30   KNEE ARTHROSCOPY Right    KNEE ARTHROSCOPY  05/21/2011  Procedure: ARTHROSCOPY KNEE;  Surgeon: Stanley E Harrison, MD;  Location: [facility];  Service: Orthopedics;  Laterality: Left;  lateral menisectomy   KNEE CLOSED REDUCTION Left 05/26/2012   Procedure: CLOSED MANIPULATION KNEE;  Surgeon: Stanley E Harrison, MD;  Location: [facility];  Service: Orthopedics;  Laterality: Left;   left knee arthroscopy  08/14/2005 Dr. Harrison torn meniscus   LUMBAR SPINE SURGERY     PARTIAL HYSTERECTOMY     TOTAL KNEE ARTHROPLASTY  03/31/2012   Procedure: TOTAL KNEE ARTHROPLASTY;  Surgeon: Stanley E Harrison, MD;  Location:  [facility];  Service: Orthopedics;  Laterality: Left;  Left Total Knee Arthroplasty   TRIGGER FINGER RELEASE Right 01/24/2017   Procedure: RELEASE TRIGGER FINGER/A-1 PULLEY right thumb;  Surgeon: Harrison, Stanley E, MD;  Location: [facility];  Service: Orthopedics;  Laterality: Right;    Past Family History   Family History  Problem Relation Age of Onset   Heart disease Mother    Hypertension Mother    Colon cancer Neg Hx     Past Social History   Social History   Socioeconomic History   Marital status: Married    Spouse name: Not on file   Number of children: 3   Years of education: college   Highest education level: Not on file  Occupational History   Occupation: school cafeteria manager    Employer: [employer]    Comment: [place]    Employer: [employer]  Tobacco Use   Smoking status: Never   Smokeless tobacco: Never  Vaping Use   Vaping Use: Never used  Substance and Sexual Activity   Alcohol use: Yes    Comment: occ   Drug use: No   Sexual activity: Yes    Birth control/protection: Surgical    Comment: hyst  Other Topics Concern   Not on file  Social History Narrative   Not on file   Social Determinants of Health   Financial Resource Strain: Low Risk  (06/05/2019)   Overall Financial Resource Strain (CARDIA)    Difficulty of Paying Living Expenses: Not hard at all  Food Insecurity: No Food Insecurity (06/05/2019)   Hunger Vital Sign    Worried About Running Out of Food in the Last Year: Never true    Ran Out of Food in the Last Year: Never true  Transportation Needs: No Transportation Needs (06/05/2019)   PRAPARE - Transportation    Lack of Transportation (Medical): No    Lack of Transportation (Non-Medical): No  Physical Activity: Inactive (06/05/2019)   Exercise Vital Sign    Days of Exercise per Week: 1 day    Minutes of Exercise per Session: 0 min  Stress: Stress Concern Present (06/05/2019)   [employer] - Occupational Stress Questionnaire    Feeling of Stress : To some extent  Social Connections: Socially Integrated (06/05/2019)   Social Connection and Isolation Panel [NHANES]    Frequency of Communication with Friends and Family: Twice a week    Frequency of Social Gatherings with Friends and Family: Once a week    Attends Religious Services: More than 4 times per year    Active Member of Clubs or Organizations: Yes    Attends Club or Organization Meetings: More than 4 times per year    Marital Status: Married  Intimate Partner Violence: Not At Risk (06/05/2019)   Humiliation, Afraid, Rape, and Kick questionnaire    Fear of Current or Ex-Partner: No      Emotionally Abused: No    Physically Abused: No    Sexually Abused: No    Review of Systems   General: Negative for anorexia, weight loss, fever, chills, fatigue, weakness. Eyes: Negative for vision changes.  ENT: Negative for hoarseness, difficulty swallowing , nasal congestion. CV: Negative for chest pain, angina, palpitations, dyspnea on exertion, peripheral edema.  Respiratory: Negative for dyspnea at rest, dyspnea on exertion, cough, sputum, wheezing.  GI: See history of present illness. GU:  Negative for dysuria, hematuria, urinary incontinence, urinary frequency, nocturnal urination.  MS: Negative for joint pain, low back pain.  Derm: Negative for rash or itching.  Neuro: Negative for weakness, abnormal sensation, seizure, frequent headaches, memory loss,  confusion.  Psych: Negative for anxiety, depression, suicidal ideation, hallucinations.  Endo: Negative for unusual weight change.  Heme: Negative for bruising or bleeding. Allergy: Negative for rash or hives.  Physical Exam   BP (!) 148/84 (BP Location: Left Arm, Patient Position: Sitting, Cuff Size: Large)   Pulse 74   Temp 98.1 F (36.7 C) (Oral)   Ht 5' 5" (1.651 m)   Wt 207 lb 3.2 oz (94 kg)   SpO2 96%   BMI 34.48 kg/m    General:  Well-nourished, well-developed in no acute distress.  Head: Normocephalic, atraumatic.   Eyes: Conjunctiva pink, no icterus. Mouth: Oropharyngeal mucosa moist and pink , no lesions erythema or exudate. Neck: Supple without thyromegaly, masses, or lymphadenopathy.  Lungs: Clear to auscultation bilaterally.  Heart: Regular rate and rhythm, no murmurs rubs or gallops.  Abdomen: Bowel sounds are normal, nondistended, no hepatosplenomegaly or masses,  no abdominal bruits or hernia, no rebound or guarding. Mild epigastric tenderness.  Rectal: not performed Extremities: No lower extremity edema. No clubbing or deformities.  Neuro: Alert and oriented x 4 , grossly normal neurologically.  Skin: Warm and dry, no rash or jaundice.   Psych: Alert and cooperative, normal mood and affect.  Labs   Lab Results  Component Value Date   TSH 1.300 06/01/2021   Lab Results  Component Value Date   CREATININE 0.77 05/02/2020   BUN 19 05/02/2020   NA 141 05/02/2020   K 4.2 05/02/2020   CL 105 05/02/2020   CO2 22 05/02/2020   Lab Results  Component Value Date   WBC 3.9 06/01/2021   HGB 14.4 06/01/2021   HCT 43.3 06/01/2021   MCV 88 06/01/2021   PLT 201 06/01/2021   Lab Results  Component Value Date   ALT 16 05/02/2020   AST 20 05/02/2020   ALKPHOS 68 05/02/2020   BILITOT 0.2 05/02/2020    Imaging Studies   No results found.  Assessment   Chronic constipation: drinking approximately 60 ounces of fluid daily. Trying to get fiber (vegetables) in her diet. Has taken various OTC laxatives but nothing long term. Recommend taking regular regimen. Next colonoscopy due in 2025 unless alarm symptoms develop.   GERD: fairly recent onset of reflux symptoms this year. She also has epigastric tenderness on exam. Significant for ASA powder use, quit two months ago. Ongoing NSAID use. Recommend EGD for further evaluation of abdominal pain.     PLAN   Continue pantoprazole 40mg daily before  breakfast. Start amitiza 24mcg once to twice daily for constipatoin.  EGD with Dr. Carver. ASA 2.  I have discussed the risks, alternatives, benefits with regards to but not limited to the risk of reaction to medication, bleeding, infection, perforation and the patient is agreeable to proceed. Written consent to   be obtained.    Laureen Ochs. Bobby Rumpf, [place], [facility]

## 2021-12-11 NOTE — Patient Instructions (Addendum)
Continue pantoprazole 40mg  daily before breakfast. I have sent in new RX with refills. Start amitiza (lubiprostone) 37mcg once or twice daily for constipation. RX sent to your pharmacy. Looks like Linzess was not on your formularly but generic amitiza should be. Let me know if you have any issues obtaining medication. Upper endoscopy with Dr. Abbey Chatters.

## 2021-12-11 NOTE — Progress Notes (Signed)
GI Office Note    Referring Provider: Pearson Forster NP Primary Care Physician:  Kathyrn Drown, MD  Primary Gastroenterologist: formerly Dr. Oneida Alar  Chief Complaint   Chief Complaint  Patient presents with   Gastroesophageal Reflux   Constipation     History of Present Illness   Erica Graves is a 56 y.o. female presenting today at the request of Pearson Forster NP for further evaluation of GERD, epigastric pain, chronic constipation.   Patient complains of chronic constipation. BMs daily, but tiny and not productive. Sometimes takes bisacodyl but not regularly. Has tried miralax in the past, worked okay but she did not take regularly. Never got the Linzess prescribed by PCP recently. She is not sure if there was a coverage issue. No melena, heartburn.   Over the past several months she has had heartburn. On medications for a few months. Taking pantoprazole 40mg  daily. No dysphagia.  If skips dose then symptoms return. Denies abdominal pain. Weight stable. She was on ASA powders up until a couple of months ago due to knee pain. Now on mobic as needed. Also takes ibuprofen as well as needed.    Colonoscopy May 2015: 1.Normal mucosa in the terminal ileum 2. Mild diverticulosis in the transverse colon, descending colon, and sigmoid colon 3. The LEFT colon IS SLIGHTLY redundant 4. Small internal hemorrhoids  Medications   Current Outpatient Medications  Medication Sig Dispense Refill   amLODipine (NORVASC) 5 MG tablet Take 1 tablet (5 mg total) by mouth daily. 90 tablet 0   Meloxicam 5 MG CAPS Take 5 mg by mouth daily. 30 capsule 0   ondansetron (ZOFRAN-ODT) 8 MG disintegrating tablet Take 1 tablet (8 mg total) by mouth every 8 (eight) hours as needed for nausea or vomiting. 20 tablet 0   pantoprazole (PROTONIX) 40 MG tablet TAKE 1 TABLET(40 MG) BY MOUTH DAILY 90 tablet 0   sertraline (ZOLOFT) 50 MG tablet Take one tab po qd 90 tablet 0   No current  facility-administered medications for this visit.    Allergies   Allergies as of 12/11/2021 - Review Complete 12/11/2021  Allergen Reaction Noted   Norco [hydrocodone-acetaminophen] Nausea And Vomiting 09/30/2014    Past Medical History   Past Medical History:  Diagnosis Date   Carpal tunnel syndrome, bilateral    Chronic low back pain    Constipation    Counseling for estrogen replacement therapy 07/07/2014   Depression with anxiety    Dyspareunia 07/07/2014   GAD (generalized anxiety disorder) 04/07/2020   History of kidney stones    Hot flashes 07/07/2014   Hypertension    Menopause 07/07/2014   Moody 07/07/2014   Obesity    Umbilical hernia     Past Surgical History   Past Surgical History:  Procedure Laterality Date   ABDOMINAL HYSTERECTOMY     BACK SURGERY     removal of cyst subdermal   CARPAL TUNNEL RELEASE Left 09/30/2014   Procedure: CARPAL TUNNEL RELEASE;  Surgeon: Carole Civil, MD;  Location: [facility];  Service: Orthopedics;  Laterality: Left;   CARPAL TUNNEL RELEASE Right 09/28/2015   Procedure: CARPAL TUNNEL RELEASE;  Surgeon: Carole Civil, MD;  Location: [facility];  Service: Orthopedics;  Laterality: Right;   COLONOSCOPY N/A 06/29/2013   Procedure: COLONOSCOPY;  Surgeon: Danie Binder, MD;  Location: [facility];  Service: Endoscopy;  Laterality: N/A;  1:30   KNEE ARTHROSCOPY Right    KNEE ARTHROSCOPY  05/21/2011  Procedure: ARTHROSCOPY KNEE;  Surgeon: Vickki Hearing, MD;  Location: [facility];  Service: Orthopedics;  Laterality: Left;  lateral menisectomy   KNEE CLOSED REDUCTION Left 05/26/2012   Procedure: CLOSED MANIPULATION KNEE;  Surgeon: Vickki Hearing, MD;  Location: [facility];  Service: Orthopedics;  Laterality: Left;   left knee arthroscopy  08/14/2005 Dr. Romeo Apple torn meniscus   LUMBAR SPINE SURGERY     PARTIAL HYSTERECTOMY     TOTAL KNEE ARTHROPLASTY  03/31/2012   Procedure: TOTAL KNEE ARTHROPLASTY;  Surgeon: Vickki Hearing, MD;  Location:  [facility];  Service: Orthopedics;  Laterality: Left;  Left Total Knee Arthroplasty   TRIGGER FINGER RELEASE Right 01/24/2017   Procedure: RELEASE TRIGGER FINGER/A-1 PULLEY right thumb;  Surgeon: Vickki Hearing, MD;  Location: [facility];  Service: Orthopedics;  Laterality: Right;    Past Family History   Family History  Problem Relation Age of Onset   Heart disease Mother    Hypertension Mother    Colon cancer Neg Hx     Past Social History   Social History   Socioeconomic History   Marital status: Married    Spouse name: Not on file   Number of children: 3   Years of education: college   Highest education level: Not on file  Occupational History   Occupation: school Youth worker    Employer: [employer]    Comment: [place]    Employer: [employer]  Tobacco Use   Smoking status: Never   Smokeless tobacco: Never  Vaping Use   Vaping Use: Never used  Substance and Sexual Activity   Alcohol use: Yes    Comment: occ   Drug use: No   Sexual activity: Yes    Birth control/protection: Surgical    Comment: hyst  Other Topics Concern   Not on file  Social History Narrative   Not on file   Social Determinants of Health   Financial Resource Strain: Low Risk  (06/05/2019)   Overall Financial Resource Strain (CARDIA)    Difficulty of Paying Living Expenses: Not hard at all  Food Insecurity: No Food Insecurity (06/05/2019)   Hunger Vital Sign    Worried About Running Out of Food in the Last Year: Never true    Ran Out of Food in the Last Year: Never true  Transportation Needs: No Transportation Needs (06/05/2019)   PRAPARE - Administrator, Civil Service (Medical): No    Lack of Transportation (Non-Medical): No  Physical Activity: Inactive (06/05/2019)   Exercise Vital Sign    Days of Exercise per Week: 1 day    Minutes of Exercise per Session: 0 min  Stress: Stress Concern Present (06/05/2019)   [employer] of  Occupational Health - Occupational Stress Questionnaire    Feeling of Stress : To some extent  Social Connections: Socially Integrated (06/05/2019)   Social Connection and Isolation Panel [NHANES]    Frequency of Communication with Friends and Family: Twice a week    Frequency of Social Gatherings with Friends and Family: Once a week    Attends Religious Services: More than 4 times per year    Active Member of [employer] or Organizations: Yes    Attends Banker Meetings: More than 4 times per year    Marital Status: Married  Catering manager Violence: Not At Risk (06/05/2019)   Humiliation, Afraid, Rape, and Kick questionnaire    Fear of Current or Ex-Partner: No  Emotionally Abused: No    Physically Abused: No    Sexually Abused: No    Review of Systems   General: Negative for anorexia, weight loss, fever, chills, fatigue, weakness. Eyes: Negative for vision changes.  ENT: Negative for hoarseness, difficulty swallowing , nasal congestion. CV: Negative for chest pain, angina, palpitations, dyspnea on exertion, peripheral edema.  Respiratory: Negative for dyspnea at rest, dyspnea on exertion, cough, sputum, wheezing.  GI: See history of present illness. GU:  Negative for dysuria, hematuria, urinary incontinence, urinary frequency, nocturnal urination.  MS: Negative for joint pain, low back pain.  Derm: Negative for rash or itching.  Neuro: Negative for weakness, abnormal sensation, seizure, frequent headaches, memory loss,  confusion.  Psych: Negative for anxiety, depression, suicidal ideation, hallucinations.  Endo: Negative for unusual weight change.  Heme: Negative for bruising or bleeding. Allergy: Negative for rash or hives.  Physical Exam   BP (!) 148/84 (BP Location: Left Arm, Patient Position: Sitting, Cuff Size: Large)   Pulse 74   Temp 98.1 F (36.7 C) (Oral)   Ht 5\' 5"  (1.651 m)   Wt 207 lb 3.2 oz (94 kg)   SpO2 96%   BMI 34.48 kg/m    General:  Well-nourished, well-developed in no acute distress.  Head: Normocephalic, atraumatic.   Eyes: Conjunctiva pink, no icterus. Mouth: Oropharyngeal mucosa moist and pink , no lesions erythema or exudate. Neck: Supple without thyromegaly, masses, or lymphadenopathy.  Lungs: Clear to auscultation bilaterally.  Heart: Regular rate and rhythm, no murmurs rubs or gallops.  Abdomen: Bowel sounds are normal, nondistended, no hepatosplenomegaly or masses,  no abdominal bruits or hernia, no rebound or guarding. Mild epigastric tenderness.  Rectal: not performed Extremities: No lower extremity edema. No clubbing or deformities.  Neuro: Alert and oriented x 4 , grossly normal neurologically.  Skin: Warm and dry, no rash or jaundice.   Psych: Alert and cooperative, normal mood and affect.  Labs   Lab Results  Component Value Date   TSH 1.300 06/01/2021   Lab Results  Component Value Date   CREATININE 0.77 05/02/2020   BUN 19 05/02/2020   NA 141 05/02/2020   K 4.2 05/02/2020   CL 105 05/02/2020   CO2 22 05/02/2020   Lab Results  Component Value Date   WBC 3.9 06/01/2021   HGB 14.4 06/01/2021   HCT 43.3 06/01/2021   MCV 88 06/01/2021   PLT 201 06/01/2021   Lab Results  Component Value Date   ALT 16 05/02/2020   AST 20 05/02/2020   ALKPHOS 68 05/02/2020   BILITOT 0.2 05/02/2020    Imaging Studies   No results found.  Assessment   Chronic constipation: drinking approximately 60 ounces of fluid daily. Trying to get fiber (vegetables) in her diet. Has taken various OTC laxatives but nothing long term. Recommend taking regular regimen. Next colonoscopy due in 2025 unless alarm symptoms develop.   GERD: fairly recent onset of reflux symptoms this year. She also has epigastric tenderness on exam. Significant for ASA powder use, quit two months ago. Ongoing NSAID use. Recommend EGD for further evaluation of abdominal pain.     PLAN   Continue pantoprazole 40mg  daily before  breakfast. Start amitiza 2026 once to twice daily for constipatoin.  EGD with Dr. . ASA 2.  I have discussed the risks, alternatives, benefits with regards to but not limited to the risk of reaction to medication, bleeding, infection, perforation and the patient is agreeable to proceed. Written consent to  be obtained.    Laureen Ochs. Bobby Rumpf, [place], [facility]

## 2021-12-25 ENCOUNTER — Encounter: Payer: Self-pay | Admitting: *Deleted

## 2022-01-04 ENCOUNTER — Ambulatory Visit (HOSPITAL_COMMUNITY): Payer: BC Managed Care – PPO | Admitting: Anesthesiology

## 2022-01-04 ENCOUNTER — Encounter (HOSPITAL_COMMUNITY): Payer: Self-pay

## 2022-01-04 ENCOUNTER — Ambulatory Visit (HOSPITAL_COMMUNITY)
Admission: RE | Admit: 2022-01-04 | Discharge: 2022-01-04 | Disposition: A | Discharge status: Home / Self Care | Payer: BC Managed Care – PPO | Source: Ambulatory Visit | Attending: Internal Medicine | Admitting: Internal Medicine

## 2022-01-04 ENCOUNTER — Other Ambulatory Visit: Payer: Self-pay

## 2022-01-04 ENCOUNTER — Encounter (HOSPITAL_COMMUNITY)
Admission: RE | Disposition: A | Discharge status: Home / Self Care | Payer: Self-pay | Source: Ambulatory Visit | Attending: Internal Medicine

## 2022-01-04 DIAGNOSIS — R1013 Epigastric pain: Secondary | ICD-10-CM | POA: Insufficient documentation

## 2022-01-04 DIAGNOSIS — K449 Diaphragmatic hernia without obstruction or gangrene: Secondary | ICD-10-CM | POA: Diagnosis not present

## 2022-01-04 DIAGNOSIS — K222 Esophageal obstruction: Secondary | ICD-10-CM | POA: Diagnosis not present

## 2022-01-04 DIAGNOSIS — I10 Essential (primary) hypertension: Secondary | ICD-10-CM | POA: Insufficient documentation

## 2022-01-04 DIAGNOSIS — K219 Gastro-esophageal reflux disease without esophagitis: Secondary | ICD-10-CM | POA: Diagnosis not present

## 2022-01-04 DIAGNOSIS — M1990 Unspecified osteoarthritis, unspecified site: Secondary | ICD-10-CM | POA: Insufficient documentation

## 2022-01-04 DIAGNOSIS — R519 Headache, unspecified: Secondary | ICD-10-CM | POA: Insufficient documentation

## 2022-01-04 DIAGNOSIS — F32A Depression, unspecified: Secondary | ICD-10-CM | POA: Diagnosis not present

## 2022-01-04 DIAGNOSIS — F419 Anxiety disorder, unspecified: Secondary | ICD-10-CM | POA: Diagnosis not present

## 2022-01-04 HISTORY — PX: ESOPHAGOGASTRODUODENOSCOPY (EGD) WITH PROPOFOL: SHX5813

## 2022-01-04 SURGERY — ESOPHAGOGASTRODUODENOSCOPY (EGD) WITH PROPOFOL
Anesthesia: General

## 2022-01-04 MED ORDER — LACTATED RINGERS IV SOLN: INTRAVENOUS | Status: DC

## 2022-01-04 MED ORDER — PROPOFOL 10 MG/ML IV BOLUS
INTRAVENOUS | Status: DC | PRN
  Administered 2022-01-04 (×3): 40 mg via INTRAVENOUS

## 2022-01-04 NOTE — Discharge Instructions (Addendum)
EGD Discharge instructions Please read the instructions outlined below and refer to this sheet in the next few weeks. These discharge instructions provide you with general information on caring for yourself after you leave the hospital. Your doctor may also give you specific instructions. While your treatment has been planned according to the most current medical practices available, unavoidable complications occasionally occur. If you have any problems or questions after discharge, please call your doctor. ACTIVITY You may resume your regular activity but move at a slower pace for the next 24 hours.  Take frequent rest periods for the next 24 hours.  Walking will help expel (get rid of) the air and reduce the bloated feeling in your abdomen.  No driving for 24 hours (because of the anesthesia (medicine) used during the test).  You may shower.  Do not sign any important legal documents or operate any machinery for 24 hours (because of the anesthesia used during the test).  NUTRITION Drink plenty of fluids.  You may resume your normal diet.  Begin with a light meal and progress to your normal diet.  Avoid alcoholic beverages for 24 hours or as instructed by your caregiver.  MEDICATIONS You may resume your normal medications unless your caregiver tells you otherwise.  WHAT YOU CAN EXPECT TODAY You may experience abdominal discomfort such as a feeling of fullness or "gas" pains.  FOLLOW-UP Your doctor will discuss the results of your test with you.  SEEK IMMEDIATE MEDICAL ATTENTION IF ANY OF THE FOLLOWING OCCUR: Excessive nausea (feeling sick to your stomach) and/or vomiting.  Severe abdominal pain and distention (swelling).  Trouble swallowing.  Temperature over 101 F (37.8 C).  Rectal bleeding or vomiting of blood.    Your upper endoscopy revealed a small hiatal hernia and a mild Schatzki's ring.  Your stomach and small bowel appeared normal.  Overall everything looked good today.   Continue on pantoprazole daily.  Try to limit your NSAID use as best as you can.  Follow-up with GI in 3 months.  I hope you have a great rest of your week!  Hennie Duos. Marletta Lor, D.O. Gastroenterology and Hepatology [facility]

## 2022-01-04 NOTE — Anesthesia Postprocedure Evaluation (Signed)
Anesthesia Post Note  Patient: Erica Graves  Procedure(s) Performed: ESOPHAGOGASTRODUODENOSCOPY (EGD) WITH PROPOFOL  Patient location during evaluation: Phase II Anesthesia Type: General Level of consciousness: awake and alert and oriented Pain management: pain level controlled Vital Signs Assessment: post-procedure vital signs reviewed and stable Respiratory status: spontaneous breathing, nonlabored ventilation and respiratory function stable Cardiovascular status: blood pressure returned to baseline and stable Postop Assessment: no apparent nausea or vomiting Anesthetic complications: no  No notable events documented.   Last Vitals:  Vitals:   01/04/22 1018 01/04/22 1209  BP: (!) 153/99 113/67  Pulse: 67 69  Resp: 18 (!) 22  Temp: 37.2 C 36.7 C  SpO2: 99% 95%    Last Pain:  Vitals:   01/04/22 1209  TempSrc: Oral  PainSc: 0-No pain                 Siren Porrata C Janet Decesare

## 2022-01-04 NOTE — Interval H&P Note (Signed)
History and Physical Interval Note:  01/04/2022 10:41 AM  Erica Graves  has presented today for surgery, with the diagnosis of epigastric pain, gerd.  The various methods of treatment have been discussed with the patient and family. After consideration of risks, benefits and other options for treatment, the patient has consented to  Procedure(s) with comments: ESOPHAGOGASTRODUODENOSCOPY (EGD) WITH PROPOFOL (N/A) - 12:30pm, asa 2 as a surgical intervention.  The patient's history has been reviewed, patient examined, no change in status, stable for surgery.  I have reviewed the patient's chart and labs.  Questions were answered to the patient's satisfaction.     Lanelle Bal

## 2022-01-04 NOTE — Op Note (Signed)
[facility] Patient Name: Erica Graves Procedure Date: 01/04/2022 11:08 AM MRN: [id number] Date of Birth: 01-07-1966 Attending MD: Elon Alas. Abbey Chatters , [place], [id number] CSN: [id number] Age: 56 Admit Type: Outpatient Procedure:                Upper GI endoscopy Indications:              Epigastric abdominal pain, Heartburn Providers:                Elon Alas. Abbey Chatters, DO, Janeece Riggers, RN, Everardo Pacific Referring MD:              Medicines:                See the Anesthesia note for documentation of the                            administered medications Complications:            No immediate complications. Estimated Blood Loss:     Estimated blood loss: none. Procedure:                Pre-Anesthesia Assessment:                           - The anesthesia plan was to use monitored                            anesthesia care (MAC).                           After obtaining informed consent, the endoscope was                            passed under direct vision. Throughout the                            procedure, the patient's blood pressure, pulse, and                            oxygen saturations were monitored continuously. The                            [id number]-[id number] ([id number]) scope was introduced through the                            mouth, and advanced to the second part of duodenum.                            The upper GI endoscopy was accomplished without                            difficulty. The patient tolerated the procedure                            well. Scope In: 12:02:30 PM  Scope Out: 12:05:18 PM Total Procedure Duration: 0 hours 2 minutes 48 seconds  Findings:      A mild Schatzki ring was found in the lower third of the esophagus.      A small hiatal hernia was present.      The entire examined stomach was normal.      The duodenal bulb, first portion of the duodenum and second portion of       the duodenum were normal. Impression:                - Mild Schatzki ring.                           - Small hiatal hernia.                           - Normal stomach.                           - Normal duodenal bulb, first portion of the                            duodenum and second portion of the duodenum.                           - No specimens collected. Moderate Sedation:      Per Anesthesia Care Recommendation:           - Patient has a contact number available for                            emergencies. The signs and symptoms of potential                            delayed complications were discussed with the                            patient. Return to normal activities tomorrow.                            Written discharge instructions were provided to the                            patient.                           - Resume previous diet.                           - Continue present medications.                           - Use Protonix (pantoprazole) 40 mg PO daily.                           - Return to GI clinic in 3 months. Procedure Code(s):        --- Professional ---  39532, Esophagogastroduodenoscopy, flexible,                            transoral; diagnostic, including collection of                            specimen(s) by brushing or washing, when performed                            (separate procedure) Diagnosis Code(s):        --- Professional ---                           K22.2, Esophageal obstruction                           K44.9, Diaphragmatic hernia without obstruction or                            gangrene                           R10.13, Epigastric pain                           R12, Heartburn CPT copyright 2022 American Medical Association. All rights reserved. The codes documented in this report are preliminary and upon coder review may  be revised to meet current compliance requirements. Elon Alas. Abbey Chatters, DO [place] Abbey Chatters, DO 01/04/2022 12:08:30 PM This  report has been signed electronically. Number of Addenda: 0

## 2022-01-04 NOTE — Anesthesia Preprocedure Evaluation (Addendum)
Anesthesia Evaluation  Patient identified by MRN, date of birth, ID band Patient awake    Reviewed: Allergy & Precautions, H&P , NPO status , Patient's Chart, lab work & pertinent test results  Airway Mallampati: II  TM Distance: >3 FB Neck ROM: Full    Dental  (+) Dental Advisory Given, Missing   Pulmonary neg pulmonary ROS   Pulmonary exam normal breath sounds clear to auscultation       Cardiovascular Exercise Tolerance: Good hypertension, Pt. on medications Normal cardiovascular exam Rhythm:Regular Rate:Normal     Neuro/Psych  Headaches PSYCHIATRIC DISORDERS Anxiety Depression     Neuromuscular disease    GI/Hepatic Neg liver ROS,GERD  Medicated and Controlled,,  Endo/Other  negative endocrine ROS    Renal/GU negative Renal ROS  negative genitourinary   Musculoskeletal  (+) Arthritis , Osteoarthritis,    Abdominal   Peds negative pediatric ROS (+)  Hematology negative hematology ROS (+)   Anesthesia Other Findings   Reproductive/Obstetrics negative OB ROS                             Anesthesia Physical Anesthesia Plan  ASA: 2  Anesthesia Plan: General   Post-op Pain Management: Minimal or no pain anticipated   Induction: Intravenous  PONV Risk Score and Plan: Propofol infusion  Airway Management Planned: Nasal Cannula and Natural Airway  Additional Equipment:   Intra-op Plan:   Post-operative Plan:   Informed Consent: I have reviewed the patients History and Physical, chart, labs and discussed the procedure including the risks, benefits and alternatives for the proposed anesthesia with the patient or authorized representative who has indicated his/her understanding and acceptance.     Dental advisory given  Plan Discussed with: CRNA and Surgeon  Anesthesia Plan Comments:        Anesthesia Quick Evaluation

## 2022-01-04 NOTE — Transfer of Care (Signed)
Immediate Anesthesia Transfer of Care Note  Patient: Erica Graves  Procedure(s) Performed: ESOPHAGOGASTRODUODENOSCOPY (EGD) WITH PROPOFOL  Patient Location: Short Stay  Anesthesia Type:General  Level of Consciousness: awake, alert , oriented, and patient cooperative  Airway & Oxygen Therapy: Patient Spontanous Breathing  Post-op Assessment: Report given to RN, Post -op Vital signs reviewed and stable, and Patient moving all extremities X 4  Post vital signs: Reviewed and stable  Last Vitals:  Vitals Value Taken Time  BP    Temp 36.7 C 01/04/22 1209  Pulse 69 01/04/22 1209  Resp 22 01/04/22 1209  SpO2 95 % 01/04/22 1209    Last Pain:  Vitals:   01/04/22 1209  TempSrc: Oral  PainSc:       Patients Stated Pain Goal: 7 (01/04/22 1008)  Complications: No notable events documented.

## 2022-01-10 ENCOUNTER — Encounter (HOSPITAL_COMMUNITY): Payer: Self-pay | Admitting: Internal Medicine

## 2022-02-23 ENCOUNTER — Other Ambulatory Visit: Payer: Self-pay | Admitting: *Deleted

## 2022-02-23 MED ORDER — SERTRALINE HCL 50 MG PO TABS: ORAL_TABLET | ORAL | 0 refills | Status: DC

## 2022-04-02 ENCOUNTER — Ambulatory Visit
Admission: EM | Admit: 2022-04-02 | Discharge: 2022-04-02 | Disposition: A | Discharge status: Home / Self Care | Payer: BC Managed Care – PPO | Attending: Nurse Practitioner | Admitting: Nurse Practitioner

## 2022-04-02 DIAGNOSIS — J069 Acute upper respiratory infection, unspecified: Secondary | ICD-10-CM | POA: Insufficient documentation

## 2022-04-02 DIAGNOSIS — M545 Low back pain, unspecified: Secondary | ICD-10-CM | POA: Insufficient documentation

## 2022-04-02 DIAGNOSIS — Z1152 Encounter for screening for COVID-19: Secondary | ICD-10-CM | POA: Insufficient documentation

## 2022-04-02 LAB — POCT URINALYSIS DIP (MANUAL ENTRY)
Bilirubin, UA: NEGATIVE
Blood, UA: NEGATIVE
Glucose, UA: NEGATIVE mg/dL
Ketones, POC UA: NEGATIVE mg/dL
Nitrite, UA: NEGATIVE
Spec Grav, UA: 1.025 (ref 1.010–1.025)
Urobilinogen, UA: 0.2 E.U./dL
pH, UA: 6 (ref 5.0–8.0)

## 2022-04-02 MED ORDER — BENZONATATE 100 MG PO CAPS: 100.0000 mg | ORAL_CAPSULE | Freq: Three times a day (TID) | ORAL | 0 refills | Status: DC

## 2022-04-02 MED ORDER — TIZANIDINE HCL 4 MG PO TABS: 4.0000 mg | ORAL_TABLET | Freq: Three times a day (TID) | ORAL | 0 refills | Status: DC | PRN

## 2022-04-02 MED ORDER — NAPROXEN 500 MG PO TABS: 500.0000 mg | ORAL_TABLET | Freq: Two times a day (BID) | ORAL | 0 refills | Status: DC

## 2022-04-02 NOTE — ED Triage Notes (Signed)
Cough, congestion, headache that started Friday. Back pain on left lower side that started last week. Taking mucinex with no relief of symptoms.

## 2022-04-02 NOTE — ED Provider Notes (Signed)
[facility]    CSN: [id number] Arrival date & time: 04/02/22  1503      History   Chief Complaint Chief Complaint  Patient presents with   Back Pain   Cough    HPI Erica Graves is a 57 y.o. female.   Patient presents today for 3-day history of bodyaches, chills, dry cough, nasal congestion and runny nose, sneezing, sore throat that has not improved, headache that is also not improved, loss of taste, and fatigue.  She denies shortness of breath, chest pain, chest congestion, ear pain or pressure, abdominal pain, nausea/vomiting, diarrhea, and decreased appetite.  No known sick contacts.  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  Has taken Mucinex for symptoms which has not helped very much.  Reports she took an at home COVID test 2 days ago that was negative.  Patient also concerned about back pain that started suddenly 3 days ago.  Denies recent trauma, accident, fall, or injury to the back.  Reports the pain is severe and describes the pain as "annoying."  Reports the pain does not radiate to the down the leg or to the buttocks.  Has not tried anything for the pain so far.   Denies numbness or tingling, saddle anesthesia, bowel/bladder incontinence, fevers, nausea/vomiting, and dysuria/urinary frequency.   Reports she does lift heavy objects while at work.      Past Medical History:  Diagnosis Date   Carpal tunnel syndrome, bilateral    Chronic low back pain    Constipation    Counseling for estrogen replacement therapy 07/07/2014   Depression with anxiety    Dyspareunia 07/07/2014   GAD (generalized anxiety disorder) 04/07/2020   History of kidney stones    Hot flashes 07/07/2014   Hypertension    Menopause 07/07/2014   Moody 07/07/2014   Obesity    Umbilical hernia     Patient Active Problem List   Diagnosis Date Noted   Abdominal pain, epigastric 12/11/2021   Chronic idiopathic constipation 11/10/2021   Arthritis of carpometacarpal (CMC)  joint of right thumb 10/07/2020   Radial styloid tenosynovitis (de quervain) 10/07/2020   Irritable mood 04/07/2020   GAD (generalized anxiety disorder) 04/07/2020   Vaginal itching 06/05/2019   Vaginal dryness 06/05/2019   Screening for colorectal cancer 06/05/2019   Screening for cholesterol level 06/05/2019   S/P trigger finger release 01/24/17 02/06/2017   Trigger finger of right thumb    Essential hypertension, benign 11/18/2015   Gastroesophageal reflux disease without esophagitis 05/06/2015   Hot flashes 07/07/2014   Dyspareunia 07/07/2014   Moody 07/07/2014   Menopause 07/07/2014   Counseling for estrogen replacement therapy 07/07/2014   Morbid obesity (HCC) 03/05/2014   Carpal tunnel syndrome of right wrist 12/22/2013   Vitamin D deficiency 10/14/2013   Migraine headache without aura 11/24/2012   Muscle contraction headache 11/24/2012   CTS (carpal tunnel syndrome) 08/14/2012   Stiffness of joint, not elsewhere classified, lower leg 04/22/2012   Weakness of left leg 04/22/2012   Difficulty walking 04/22/2012   S/P total knee replacement, left 03/31/12 04/03/2012   Arthritis of knee, degenerative 03/05/2012   Osteoarthritis of left knee 12/12/2011   S/P arthroscopy of left knee 06/13/2011   Lateral meniscus tear 06/13/2011   Stiffness of knee joint 06/05/2011   Medial meniscus, posterior horn derangement 05/16/2011   DEGENERATIVE JOINT DISEASE, KNEES, BILATERAL 12/19/2009   PATELLO-FEMORAL SYNDROME 12/19/2009   FOOT PAIN, LEFT 07/22/2007   DEPRESSION/ANXIETY  12/27/2006   SHOULDER PAIN, LEFT 12/27/2006   OVERWEIGHT 11/12/2006   CONSTIPATION 11/12/2006   Lumbago 11/12/2006    Past Surgical History:  Procedure Laterality Date   ABDOMINAL HYSTERECTOMY     BACK SURGERY     removal of cyst subdermal   CARPAL TUNNEL RELEASE Left 09/30/2014   Procedure: CARPAL TUNNEL RELEASE;  Surgeon: Carole Civil, MD;  Location: [facility];  Service: Orthopedics;  Laterality: Left;    CARPAL TUNNEL RELEASE Right 09/28/2015   Procedure: CARPAL TUNNEL RELEASE;  Surgeon: Carole Civil, MD;  Location: [facility];  Service: Orthopedics;  Laterality: Right;   COLONOSCOPY N/A 06/29/2013   Procedure: COLONOSCOPY;  Surgeon: Danie Binder, MD;  Location: [facility];  Service: Endoscopy;  Laterality: N/A;  1:30   ESOPHAGOGASTRODUODENOSCOPY (EGD) WITH PROPOFOL N/A 01/04/2022   Procedure: ESOPHAGOGASTRODUODENOSCOPY (EGD) WITH PROPOFOL;  Surgeon: Eloise Harman, DO;  Location: [facility];  Service: Endoscopy;  Laterality: N/A;  12:30pm, asa 2   KNEE ARTHROSCOPY Right    KNEE ARTHROSCOPY  05/21/2011   Procedure: ARTHROSCOPY KNEE;  Surgeon: Carole Civil, MD;  Location: [facility];  Service: Orthopedics;  Laterality: Left;  lateral menisectomy   KNEE CLOSED REDUCTION Left 05/26/2012   Procedure: CLOSED MANIPULATION KNEE;  Surgeon: Carole Civil, MD;  Location: [facility];  Service: Orthopedics;  Laterality: Left;   left knee arthroscopy  08/14/2005 Dr. Aline Brochure torn meniscus   LUMBAR SPINE SURGERY     PARTIAL HYSTERECTOMY     TOTAL KNEE ARTHROPLASTY  03/31/2012   Procedure: TOTAL KNEE ARTHROPLASTY;  Surgeon: Carole Civil, MD;  Location: [facility];  Service: Orthopedics;  Laterality: Left;  Left Total Knee Arthroplasty   TRIGGER FINGER RELEASE Right 01/24/2017   Procedure: RELEASE TRIGGER FINGER/A-1 PULLEY right thumb;  Surgeon: Carole Civil, MD;  Location: [facility];  Service: Orthopedics;  Laterality: Right;    OB History     Gravida  3   Para  3   Term      Preterm      AB      Living         SAB      IAB      Ectopic      Multiple      Live Births               Home Medications    Prior to Admission medications   Medication Sig Start Date End Date Taking? Authorizing Provider  amLODipine (NORVASC) 5 MG tablet Take 1 tablet (5 mg total) by mouth daily. 11/10/21  Yes Nilda Simmer, NP  benzonatate (TESSALON) 100 MG capsule Take 1 capsule  (100 mg total) by mouth every 8 (eight) hours. 04/02/22  Yes Eulogio Bear, NP  cholecalciferol (VITAMIN D3) 25 MCG (1000 UNIT) tablet Take 1,000 Units by mouth daily.   Yes [provider]  naproxen (NAPROSYN) 500 MG tablet Take 1 tablet (500 mg total) by mouth 2 (two) times daily with a meal. 04/02/22  Yes Noemi Chapel A, NP  tiZANidine (ZANAFLEX) 4 MG tablet Take 1 tablet (4 mg total) by mouth every 8 (eight) hours as needed for muscle spasms. Do not take with alcohol or while driving or operating heavy machinery.  May cause drowsiness. 04/02/22  Yes Noemi Chapel A, NP  indapamide (LOZOL) 1.25 MG tablet Take 1.25 mg by mouth daily.    [provider]  Meloxicam 5 MG CAPS Take 5 mg  by mouth daily. Patient taking differently: Take 5 mg by mouth daily as needed (pain). 07/04/21   Leath-Warren, Alda Lea, NP  sertraline (ZOLOFT) 50 MG tablet Take one tab po qd 02/23/22   Nilda Simmer, NP    Family History Family History  Problem Relation Age of Onset   Heart disease Mother    Hypertension Mother    Colon cancer Neg Hx     Social History Social History   Tobacco Use   Smoking status: Never   Smokeless tobacco: Never  Vaping Use   Vaping Use: Never used  Substance Use Topics   Alcohol use: Yes    Comment: occ   Drug use: No     Allergies   Norco [hydrocodone-acetaminophen]   Review of Systems Review of Systems Per HPI  Physical Exam Triage Vital Signs ED Triage Vitals  Enc Vitals Group     BP 04/02/22 1539 (!) 164/101     Pulse Rate 04/02/22 1539 67     Resp 04/02/22 1539 17     Temp 04/02/22 1539 98.8 F (37.1 C)     Temp Source 04/02/22 1539 Oral     SpO2 04/02/22 1539 96 %     Weight --      Height --      Head Circumference --      Peak Flow --      Pain Score 04/02/22 1540 8     Pain Loc --      Pain Edu? --      Excl. in [place]? --    No data found.  Updated Vital Signs BP (!) 153/93 (BP Location: Right Arm)   Pulse 67    Temp 98.8 F (37.1 C) (Oral)   Resp 17   SpO2 96%   Visual Acuity Right Eye Distance:   Left Eye Distance:   Bilateral Distance:    Right Eye Near:   Left Eye Near:    Bilateral Near:     Physical Exam Vitals and nursing note reviewed.  Constitutional:      General: She is not in acute distress.    Appearance: Normal appearance. She is not ill-appearing or toxic-appearing.  HENT:     Head: Normocephalic and atraumatic.     Right Ear: Tympanic membrane, ear canal and external ear normal.     Left Ear: Tympanic membrane, ear canal and external ear normal.     Nose: Congestion and rhinorrhea present.     Mouth/Throat:     Mouth: Mucous membranes are moist.     Pharynx: Oropharynx is clear. Posterior oropharyngeal erythema present. No oropharyngeal exudate.  Eyes:     General: No scleral icterus.    Extraocular Movements: Extraocular movements intact.  Cardiovascular:     Rate and Rhythm: Normal rate and regular rhythm.  Pulmonary:     Effort: Pulmonary effort is normal. No respiratory distress.     Breath sounds: Normal breath sounds. No wheezing, rhonchi or rales.  Abdominal:     General: Abdomen is flat. Bowel sounds are normal. There is no distension.     Palpations: Abdomen is soft.     Tenderness: There is no abdominal tenderness. There is no guarding.  Musculoskeletal:     Cervical back: Normal range of motion and neck supple.       Back:     Comments: Exquisitely tender to palpation in approximately area marked; no bruising, redness, swelling, or obvious deformity.  Lymphadenopathy:  Cervical: No cervical adenopathy.  Skin:    General: Skin is warm and dry.     Coloration: Skin is not jaundiced or pale.     Findings: No erythema or rash.  Neurological:     Mental Status: She is alert and oriented to person, place, and time.  Psychiatric:        Behavior: Behavior is cooperative.      UC Treatments / Results  Labs (all labs ordered are listed, but  only abnormal results are displayed) Labs Reviewed  POCT URINALYSIS DIP (MANUAL ENTRY) - Abnormal; Notable for the following components:      Result Value   Protein Ur, POC trace (*)    Leukocytes, UA Small (1+) (*)    All other components within normal limits  SARS CORONAVIRUS 2 (TAT 6-24 HRS)    EKG   Radiology No results found.  Procedures Procedures (including critical care time)  Medications Ordered in UC Medications - No data to display  Initial Impression / Assessment and Plan / UC Course  I have reviewed the triage vital signs and the nursing notes.  Pertinent labs & imaging results that were available during my care of the patient were reviewed by me and considered in my medical decision making (see chart for details).   Patient is well-appearing, afebrile, not tachycardic, not tachypneic, oxygenating well on room air.  She is mildly hypertensive today in urgent care, likely secondary to acute pain.  1. Encounter for screening for COVID-19 2.Viral URI with cough Suspect viral etiology COVID-19 testing obtained Supportive care discussed with patient Start Tessalon Perles as needed for dry cough ER and return precautions also discussed Note given for work  3. Acute left-sided low back pain without sciatica Suspect musculoskeletal cause given examination, urinalysis is negative for blood and no urinary symptoms today No red flags in history or on examination today Treat with NSAIDs, muscle relaxant, rest, ice/heat ER and return precautions discussed with patient  The patient was given the opportunity to ask questions.  All questions answered to their satisfaction.  The patient is in agreement to this plan.    Final Clinical Impressions(s) / UC Diagnoses   Final diagnoses:  Encounter for screening for COVID-19  Viral URI with cough  Acute left-sided low back pain without sciatica     Discharge Instructions      You have a viral upper respiratory  infection.  Symptoms should improve over the next week to 10 days.  If you develop chest pain or shortness of breath, go to the emergency room.  We have tested you today for COVID-19.  You will see the results in Mychart and we will call you with positive results.  Please stay home and isolate until you are aware of the results.    Some things that can make you feel better are: - Increased rest - Increasing fluid with water/sugar free electrolytes - Acetaminophen and ibuprofen as needed for fever/pain - Salt water gargling, chloraseptic spray and throat lozenges for sore throat - OTC guaifenesin (Mucinex) 600 mg twice daily for congestion - Saline sinus flushes or a neti pot for congestion - Humidifying the air -Tessalon Perles every 8 hours as needed for dry cough  The urine sample today does not show any signs of a bladder infection or kidney stone.  I suspect the pain in your back is caused by strained muscles in your back.  You can take naproxen twice daily as needed to help with the  inflammation.  You can also take the tizanidine which is a muscle relaxant to help with pain.     ED Prescriptions     Medication Sig Dispense Auth. Provider   benzonatate (TESSALON) 100 MG capsule Take 1 capsule (100 mg total) by mouth every 8 (eight) hours. 21 capsule Cathlean Marseilles A, NP   naproxen (NAPROSYN) 500 MG tablet Take 1 tablet (500 mg total) by mouth 2 (two) times daily with a meal. 30 tablet Cathlean Marseilles A, NP   tiZANidine (ZANAFLEX) 4 MG tablet Take 1 tablet (4 mg total) by mouth every 8 (eight) hours as needed for muscle spasms. Do not take with alcohol or while driving or operating heavy machinery.  May cause drowsiness. 30 tablet Valentino Nose, NP      PDMP not reviewed this encounter.   Valentino Nose, NP 04/02/22 [phone]

## 2022-04-02 NOTE — Discharge Instructions (Addendum)
You have a viral upper respiratory infection.  Symptoms should improve over the next week to 10 days.  If you develop chest pain or shortness of breath, go to the emergency room.  We have tested you today for COVID-19.  You will see the results in Mychart and we will call you with positive results.  Please stay home and isolate until you are aware of the results.    Some things that can make you feel better are: - Increased rest - Increasing fluid with water/sugar free electrolytes - Acetaminophen and ibuprofen as needed for fever/pain - Salt water gargling, chloraseptic spray and throat lozenges for sore throat - OTC guaifenesin (Mucinex) 600 mg twice daily for congestion - Saline sinus flushes or a neti pot for congestion - Humidifying the air -Tessalon Perles every 8 hours as needed for dry cough  The urine sample today does not show any signs of a bladder infection or kidney stone.  I suspect the pain in your back is caused by strained muscles in your back.  You can take naproxen twice daily as needed to help with the inflammation.  You can also take the tizanidine which is a muscle relaxant to help with pain.

## 2022-04-03 LAB — SARS CORONAVIRUS 2 (TAT 6-24 HRS): SARS Coronavirus 2: POSITIVE — AB

## 2022-04-06 ENCOUNTER — Ambulatory Visit: Payer: BC Managed Care – PPO | Admitting: Gastroenterology

## 2022-04-08 ENCOUNTER — Other Ambulatory Visit: Payer: Self-pay

## 2022-04-08 ENCOUNTER — Encounter (HOSPITAL_COMMUNITY): Payer: Self-pay

## 2022-04-08 ENCOUNTER — Emergency Department (HOSPITAL_COMMUNITY)
Admission: EM | Admit: 2022-04-08 | Discharge: 2022-04-08 | Disposition: A | Discharge status: Home / Self Care | Payer: BC Managed Care – PPO | Attending: Emergency Medicine | Admitting: Emergency Medicine

## 2022-04-08 ENCOUNTER — Emergency Department (HOSPITAL_COMMUNITY): Payer: BC Managed Care – PPO

## 2022-04-08 DIAGNOSIS — M546 Pain in thoracic spine: Secondary | ICD-10-CM | POA: Diagnosis not present

## 2022-04-08 DIAGNOSIS — I10 Essential (primary) hypertension: Secondary | ICD-10-CM | POA: Diagnosis not present

## 2022-04-08 DIAGNOSIS — R109 Unspecified abdominal pain: Secondary | ICD-10-CM | POA: Diagnosis present

## 2022-04-08 DIAGNOSIS — Z79899 Other long term (current) drug therapy: Secondary | ICD-10-CM | POA: Insufficient documentation

## 2022-04-08 DIAGNOSIS — R110 Nausea: Secondary | ICD-10-CM | POA: Diagnosis not present

## 2022-04-08 LAB — BASIC METABOLIC PANEL
Anion gap: 9 (ref 5–15)
BUN: 22 mg/dL — ABNORMAL HIGH (ref 6–20)
CO2: 24 mmol/L (ref 22–32)
Calcium: 9.1 mg/dL (ref 8.9–10.3)
Chloride: 107 mmol/L (ref 98–111)
Creatinine, Ser: 0.84 mg/dL (ref 0.44–1.00)
GFR, Estimated: >60 mL/min (ref >60)
Glucose, Bld: 116 mg/dL — ABNORMAL HIGH (ref 70–99)
Potassium: 4 mmol/L (ref 3.5–5.1)
Sodium: 140 mmol/L (ref 135–145)

## 2022-04-08 LAB — CBC
HCT: 38.9 % (ref 36.0–46.0)
Hemoglobin: 13.2 g/dL (ref 12.0–15.0)
MCH: 30.1 pg (ref 26.0–34.0)
MCHC: 33.9 g/dL (ref 30.0–36.0)
MCV: 88.6 fL (ref 80.0–100.0)
Platelets: 214 10*3/uL (ref 150–400)
RBC: 4.39 MIL/uL (ref 3.87–5.11)
RDW: 12 % (ref 11.5–15.5)
WBC: 6.2 10*3/uL (ref 4.0–10.5)
nRBC: 0 % (ref 0.0–0.2)

## 2022-04-08 LAB — URINALYSIS, ROUTINE W REFLEX MICROSCOPIC
Bilirubin Urine: NEGATIVE
Glucose, UA: NEGATIVE mg/dL
Hgb urine dipstick: NEGATIVE
Ketones, ur: NEGATIVE mg/dL
Nitrite: NEGATIVE
Protein, ur: NEGATIVE mg/dL
Specific Gravity, Urine: 1.024 (ref 1.005–1.030)
pH: 6 (ref 5.0–8.0)

## 2022-04-08 MED ORDER — ONDANSETRON HCL 4 MG/2ML IJ SOLN
4.0000 mg | Freq: Once | INTRAMUSCULAR | Status: AC
  Administered 2022-04-08: 4 mg via INTRAVENOUS
  Filled 2022-04-08: qty 2

## 2022-04-08 MED ORDER — FENTANYL CITRATE PF 50 MCG/ML IJ SOSY
50.0000 ug | PREFILLED_SYRINGE | Freq: Once | INTRAMUSCULAR | Status: AC
  Administered 2022-04-08: 50 ug via INTRAVENOUS
  Filled 2022-04-08: qty 1

## 2022-04-08 NOTE — ED Notes (Signed)
Patient transported to CT 

## 2022-04-08 NOTE — Discharge Instructions (Addendum)
Thank you for allowing me to be part of your care today.  Your workup was overall reassuring.  You did not have any kidney stones and no evidence of a UTI.  Your pain is likely musculoskeletal.  Continue to use your muscle relaxant as prescribed.  I also recommend taking 800 mg ibuprofen every 6-8 hours as needed for pain.  You may alternate this with Tylenol.  You may use heat or ice on your back, whichever feels better for you.   I recommend following up with your primary care provider should you continue to have symptoms.

## 2022-04-08 NOTE — ED Triage Notes (Signed)
Pt arrived from home via POV w c/o left flank pain that started last Monday that gradually gotten worse. 10/10 at present. Denies any other symptoms

## 2022-04-08 NOTE — ED Provider Notes (Signed)
[facility] Provider Note   CSN: [id number] Arrival date & time: 04/08/22  2052     History  Chief Complaint  Patient presents with   Flank Pain    Erica Graves is a 57 y.o. female presents to the ED from home complaining of left flank pain that started last Monday that has progressively worsened.  Patient was seen at urgent care for her back pain last week and was given NSAIDs and muscle relaxant without relief.  She states the pain now travels from her back, to her left flank, and into her abdomen on the left side.  She also has associated nausea.  Denies fever, hematuria, dysuria, difficulty urinating, vomiting, diarrhea, constipation.         Home Medications Prior to Admission medications   Medication Sig Start Date End Date Taking? Authorizing Provider  amLODipine (NORVASC) 5 MG tablet Take 1 tablet (5 mg total) by mouth daily. 11/10/21   Nilda Simmer, NP  benzonatate (TESSALON) 100 MG capsule Take 1 capsule (100 mg total) by mouth every 8 (eight) hours. 04/02/22   Eulogio Bear, NP  cholecalciferol (VITAMIN D3) 25 MCG (1000 UNIT) tablet Take 1,000 Units by mouth daily.    [provider]  indapamide (LOZOL) 1.25 MG tablet Take 1.25 mg by mouth daily.    [provider]  Meloxicam 5 MG CAPS Take 5 mg by mouth daily. Patient taking differently: Take 5 mg by mouth daily as needed (pain). 07/04/21   Leath-Warren, Alda Lea, NP  naproxen (NAPROSYN) 500 MG tablet Take 1 tablet (500 mg total) by mouth 2 (two) times daily with a meal. 04/02/22   Eulogio Bear, NP  sertraline (ZOLOFT) 50 MG tablet Take one tab po qd 02/23/22   Nilda Simmer, NP  tiZANidine (ZANAFLEX) 4 MG tablet Take 1 tablet (4 mg total) by mouth every 8 (eight) hours as needed for muscle spasms. Do not take with alcohol or while driving or operating heavy machinery.  May cause drowsiness. 04/02/22   Eulogio Bear, NP       Allergies    Norco [hydrocodone-acetaminophen]    Review of Systems   Review of Systems  Constitutional:  Negative for fever.  Gastrointestinal:  Positive for abdominal pain (left side) and nausea. Negative for constipation, diarrhea and vomiting.  Genitourinary:  Positive for flank pain. Negative for difficulty urinating, dysuria and frequency.    Physical Exam Updated Vital Signs BP (!) 175/86 (BP Location: Right Arm)   Pulse 64   Temp 98.2 F (36.8 C) (Oral)   Resp 15   Ht 5' 5"$  (1.651 m)   Wt 93.9 kg   SpO2 99%   BMI 34.45 kg/m  Physical Exam Vitals and nursing note reviewed.  Constitutional:      General: She is not in acute distress.    Appearance: She is not ill-appearing.  HENT:     Mouth/Throat:     Mouth: Mucous membranes are moist.     Pharynx: Oropharynx is clear.  Cardiovascular:     Rate and Rhythm: Normal rate and regular rhythm.     Pulses: Normal pulses.     Heart sounds: Normal heart sounds.  Pulmonary:     Effort: Pulmonary effort is normal. No respiratory distress.     Breath sounds: Normal breath sounds and air entry.  Abdominal:     General: Abdomen is flat. Bowel sounds are normal. There is no distension.  Palpations: Abdomen is soft.     Tenderness: There is abdominal tenderness in the left upper quadrant and left lower quadrant. There is left CVA tenderness. There is no right CVA tenderness.  Skin:    General: Skin is warm and dry.     Capillary Refill: Capillary refill takes less than 2 seconds.  Neurological:     Mental Status: She is alert. Mental status is at baseline.  Psychiatric:        Mood and Affect: Mood normal.        Behavior: Behavior normal.     ED Results / Procedures / Treatments   Labs (all labs ordered are listed, but only abnormal results are displayed) Labs Reviewed  URINALYSIS, ROUTINE W REFLEX MICROSCOPIC - Abnormal; Notable for the following components:      Result Value   Leukocytes,Ua TRACE (*)     Bacteria, UA RARE (*)    All other components within normal limits  BASIC METABOLIC PANEL - Abnormal; Notable for the following components:   Glucose, Bld 116 (*)    BUN 22 (*)    All other components within normal limits  CBC    EKG None  Radiology CT Renal Stone Study  Result Date: 04/08/2022 CLINICAL DATA:  Abdominal/flank pain, stone suspected EXAM: CT ABDOMEN AND PELVIS WITHOUT CONTRAST TECHNIQUE: Multidetector CT imaging of the abdomen and pelvis was performed following the standard protocol without IV contrast. RADIATION DOSE REDUCTION: This exam was performed according to the departmental dose-optimization program which includes automated exposure control, adjustment of the mA and/or kV according to patient size and/or use of iterative reconstruction technique. COMPARISON:  None Available. FINDINGS: Lower chest: No acute abnormality. Hepatobiliary: No focal liver abnormality. Centrally gaseous gallstone noted within the gallbladder lumen. No gallbladder wall thickening or pericholecystic fluid. No biliary dilatation. Pancreas: No focal lesion. Normal pancreatic contour. No surrounding inflammatory changes. No main pancreatic ductal dilatation. Spleen: Normal in size without focal abnormality. Adrenals/Urinary Tract: No adrenal nodule bilaterally. No nephrolithiasis and no hydronephrosis. No definite contour-deforming renal mass. No ureterolithiasis or hydroureter. The urinary bladder is unremarkable. Stomach/Bowel: Stomach is within normal limits. No evidence of bowel wall thickening or dilatation. Colonic diverticulosis. Appendix appears normal. Vascular/Lymphatic: Phleboliths noted along the right ovarian vein. No abdominal aorta or iliac aneurysm. No abdominal, pelvic, or inguinal lymphadenopathy. Reproductive: Status post hysterectomy. No adnexal masses. Other: No intraperitoneal free fluid. No intraperitoneal free gas. No organized fluid collection. Musculoskeletal: Small fat containing  umbilical hernia with an abdominal defect of 1 cm. No suspicious lytic or blastic osseous lesions. No acute displaced fracture. Grade 1 anterolisthesis of L4 on L5. IMPRESSION: 1. Cholelithiasis with no CT evidence of acute cholecystitis. 2. Colonic diverticulosis with no acute diverticulitis. Electronically Signed   By: Iven Finn M.D.   On: 04/08/2022 21:54    Procedures Procedures    Medications Ordered in ED Medications  ondansetron [facility]) injection 4 mg (4 mg Intravenous Given 04/08/22 2137)  fentaNYL (SUBLIMAZE) injection 50 mcg (50 mcg Intravenous Given 04/08/22 2138)    ED Course/ Medical Decision Making/ A&P                             Medical Decision Making Amount and/or Complexity of Data Reviewed Labs: ordered. Radiology: ordered.  Risk Prescription drug management.   This patient presents to the ED with chief complaint(s) of left flank pain that radiates into her left abdomen with pertinent past medical  history of obesity, HTN, kidney stones.  The complaint involves an extensive differential diagnosis and also carries with it a high risk of complications and morbidity.    The differential diagnosis includes nephrolithiasis, ureterolithiasis, pyelonephritis, acute cystitis, musculoskeletal strain, acute on chronic back pain    The initial plan is to obtain baseline labs and CT renal stone study   Additional history obtained: Additional history obtained from  none Records reviewed  Urgent Care notes from 04/02/22  Initial Assessment:   On exam, patient is overall well appearing and is not in acute distress.  Heart rate is normal in the 60s with regular rhythm.  Lungs are clear to auscultation bilaterally.  She has severe L CVA tenderness.  She also has tenderness with palpation to LUQ and LLQ.  Abdomen is soft, and all other quadrants are unremarkable.  She does have increased back pain with twisting and movement.  Normal gait and 5/5 strength in bilateral lower  extremities.    Independent ECG/labs interpretation:  The following labs were independently interpreted:  CBC without leukocytosis or anemia.  Metabolic panel without electrolyte disturbances.  Renal function is within normal.  She does have a mildly elevated BUN and glucose.  UA with trace leukocytes and rare bacteria, not likely a UTI in the setting of no urinary symptoms.    Independent visualization and interpretation of imaging: I independently visualized the following imaging with scope of interpretation limited to determining acute life threatening conditions related to emergency care: CT renal stone study, which revealed no evidence of kidney stones.  I agree with radiologist interpretation.   Treatment and Reassessment: Patient was treated with fentanyl and Zofran for pain and nausea with improvement in her symptoms.  Suspect patient's symptoms are likely musculoskeletal in nature as she does have worsening pain with movement and does perform heavy lifting tasks at work.  She has been taking prescribed muscle relaxant with some relief of symptoms.  Discussed use of ibuprofen and Tylenol for pain and inflammation.  Recommended patient follow-up with her primary care provider if she continues to have pain.   Disposition:   The patient has been appropriately medically screened and/or stabilized in the ED. I have low suspicion for any other emergent medical condition which would require further screening, evaluation or treatment in the ED or require inpatient management. At time of discharge the patient is hemodynamically stable and in no acute distress. I have discussed work-up results and diagnosis with patient and answered all questions. Patient is agreeable with discharge plan. We discussed strict return precautions for returning to the emergency department and they verbalized understanding.            Final Clinical Impression(s) / ED Diagnoses Final diagnoses:  Acute left-sided  thoracic back pain  Left lateral abdominal pain    Rx / DC Orders ED Discharge Orders     None         Pat Kocher, PA 04/08/22 2300    Isla Pence, MD 04/08/22 2303

## 2022-04-09 ENCOUNTER — Telehealth: Payer: Self-pay

## 2022-04-09 NOTE — Transitions of Care (Post Inpatient/ED Visit) (Unsigned)
   04/09/2022  Name: [name] MRN: [id number] DOB: Aug 18, 1965  Today's TOC FU Call Status: Today's TOC FU Call Status:: Unsuccessul Call (1st Attempt)  Attempted to reach the patient regarding the most recent Inpatient/ED visit.  Follow Up Plan: Additional outreach attempts will be made to reach the patient to complete the Transitions of Care (Post Inpatient/ED visit) call.   Signature Juanda Crumble, [place] Direct Dial [phone]

## 2022-04-10 NOTE — Transitions of Care (Post Inpatient/ED Visit) (Signed)
   04/10/2022  Name: Erica Graves MRN: [id number] DOB: 11/30/1965  Today's TOC FU Call Status: Today's TOC FU Call Status:: Unsuccessful Call (2nd Attempt) Unsuccessful Call (2nd Attempt) Date: 04/10/22  Attempted to reach the patient regarding the most recent Inpatient/ED visit.  Follow Up Plan: No further outreach attempts will be made at this time. We have been unable to contact the patient.  Signature Juanda Crumble, [place] Direct Dial [phone]

## 2022-04-12 ENCOUNTER — Ambulatory Visit: Payer: BC Managed Care – PPO | Admitting: Orthopedic Surgery

## 2022-04-12 ENCOUNTER — Ambulatory Visit (INDEPENDENT_AMBULATORY_CARE_PROVIDER_SITE_OTHER): Payer: BC Managed Care – PPO

## 2022-04-12 ENCOUNTER — Encounter: Payer: Self-pay | Admitting: Orthopedic Surgery

## 2022-04-12 DIAGNOSIS — Z96652 Presence of left artificial knee joint: Secondary | ICD-10-CM

## 2022-04-12 DIAGNOSIS — M1711 Unilateral primary osteoarthritis, right knee: Secondary | ICD-10-CM | POA: Diagnosis not present

## 2022-04-12 DIAGNOSIS — M1712 Unilateral primary osteoarthritis, left knee: Secondary | ICD-10-CM

## 2022-04-12 NOTE — Patient Instructions (Signed)
We will check which brand of Hyaluronic acid injections (there are several) your insurance covers. We will call you with price and schedule with you if insurance approves and you are okay with the out of pocket costs. If for any reason they will not cover or the out of pocket costs are high, we will discuss with you and let you know other options available.

## 2022-04-12 NOTE — Progress Notes (Signed)
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  Pain is located in the medial side of the joint associated with swelling decreased range of motion and nocturnal symptoms.  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  This is not helped her pain  She is a candidate for HA injection and we will follow-up with her if we can get that approved if not then we will have to try repeat injection and change her medication.

## 2022-04-19 ENCOUNTER — Telehealth: Payer: Self-pay | Admitting: Orthopedic Surgery

## 2022-04-19 NOTE — Telephone Encounter (Signed)
The patient called, stated that we are supposed to be checking on gel injections for her and she would like to know the status of that.  [phone]

## 2022-04-19 NOTE — Telephone Encounter (Signed)
We will check which brand of Hyaluronic acid injections (there are several) your insurance covers. We will call you with price and schedule with you if insurance approves and you are okay with the out of pocket costs. If for any reason they will not cover or the out of pocket costs are high, we will discuss with you and let you know other options available.      This takes a while, we will let her know  I called her to tell her

## 2022-04-23 IMAGING — MG MM DIGITAL DIAGNOSTIC UNILAT*L* W/ TOMO W/ CAD
4 series · 4 of 12 positions shown · non-contrast
Comparison: c: The breast tissue is heterogeneously dense, which
may obscure small masses.

ACR Breast Density Category previous exams.

CLINICAL DATA: Screening recall for possible left breast asymmetry.

EXAM:
DIGITAL DIAGNOSTIC UNILATERAL LEFT MAMMOGRAM WITH TOMOSYNTHESIS AND
CAD
TECHNIQUE: Left digital diagnostic mammography and breast tomosynthesis was
performed. The images were evaluated with computer-aided detection.

[L MLO synth-2D]
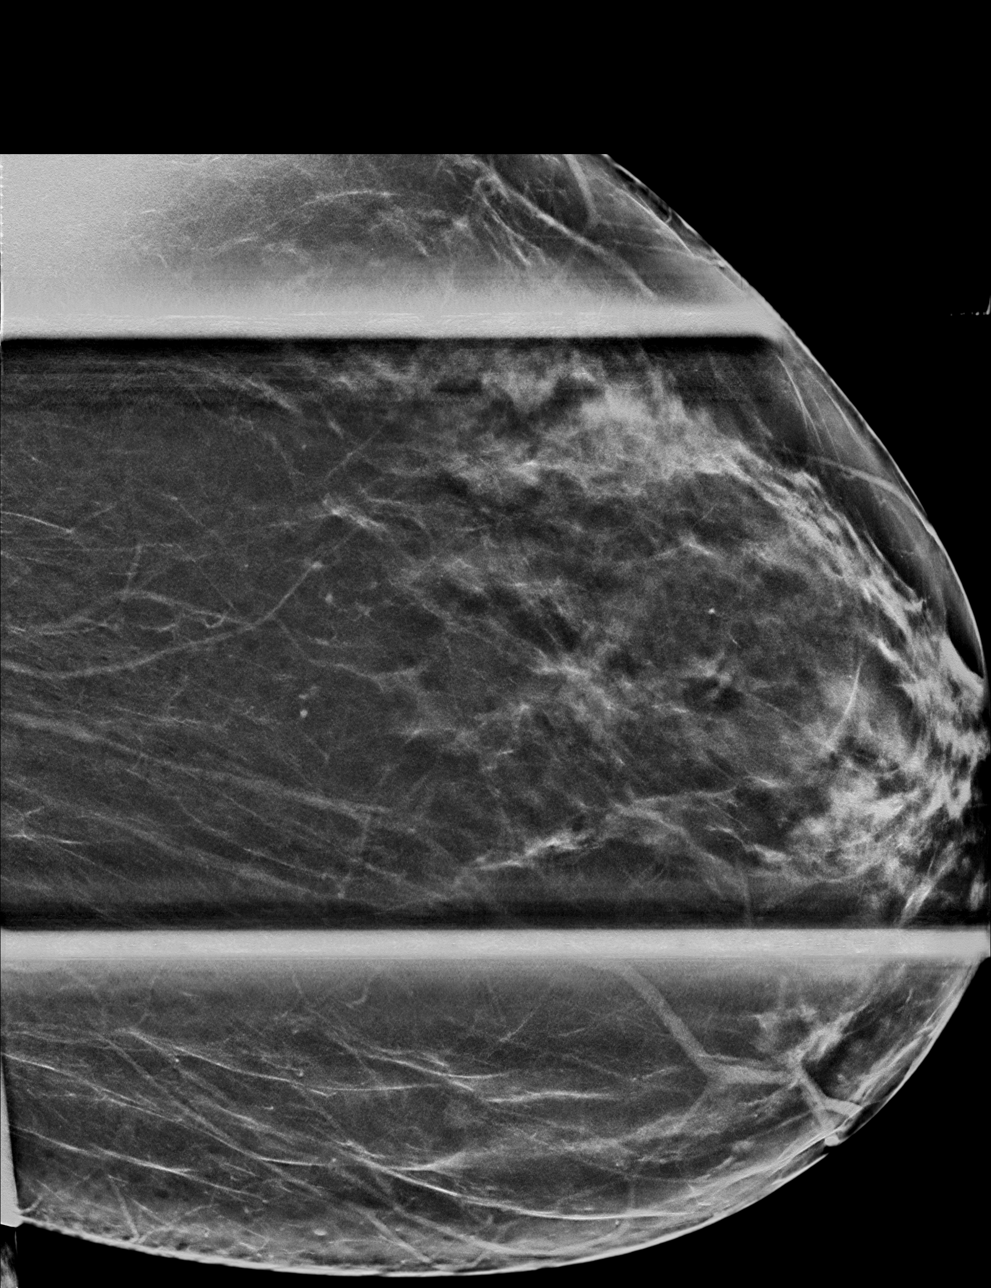

[L CC synth-2D]
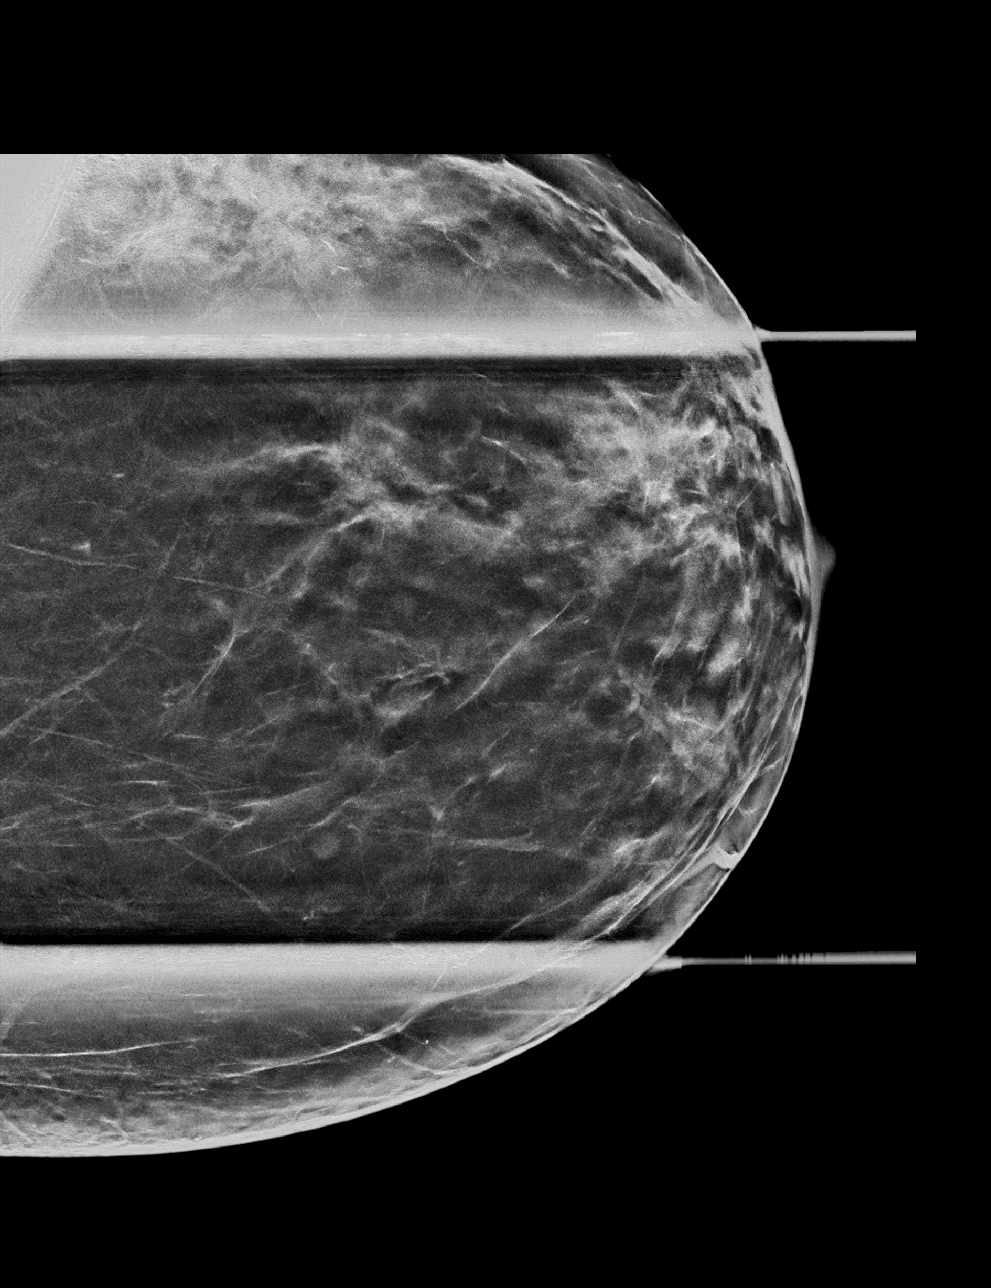

[L CC tomo · tomo slice 33/66.0]
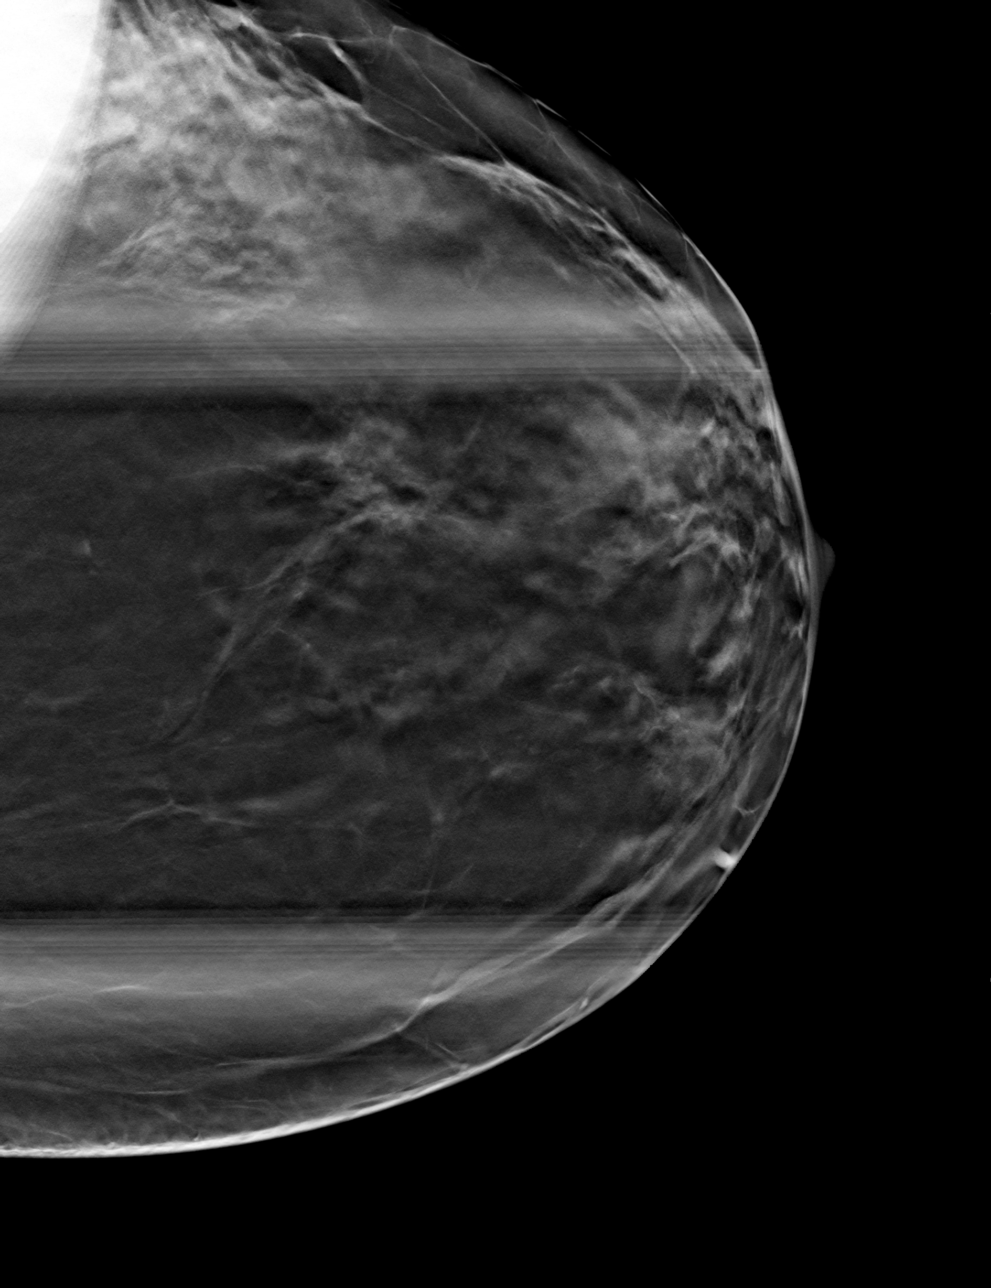

[L MLO tomo · tomo slice 37/72.0]
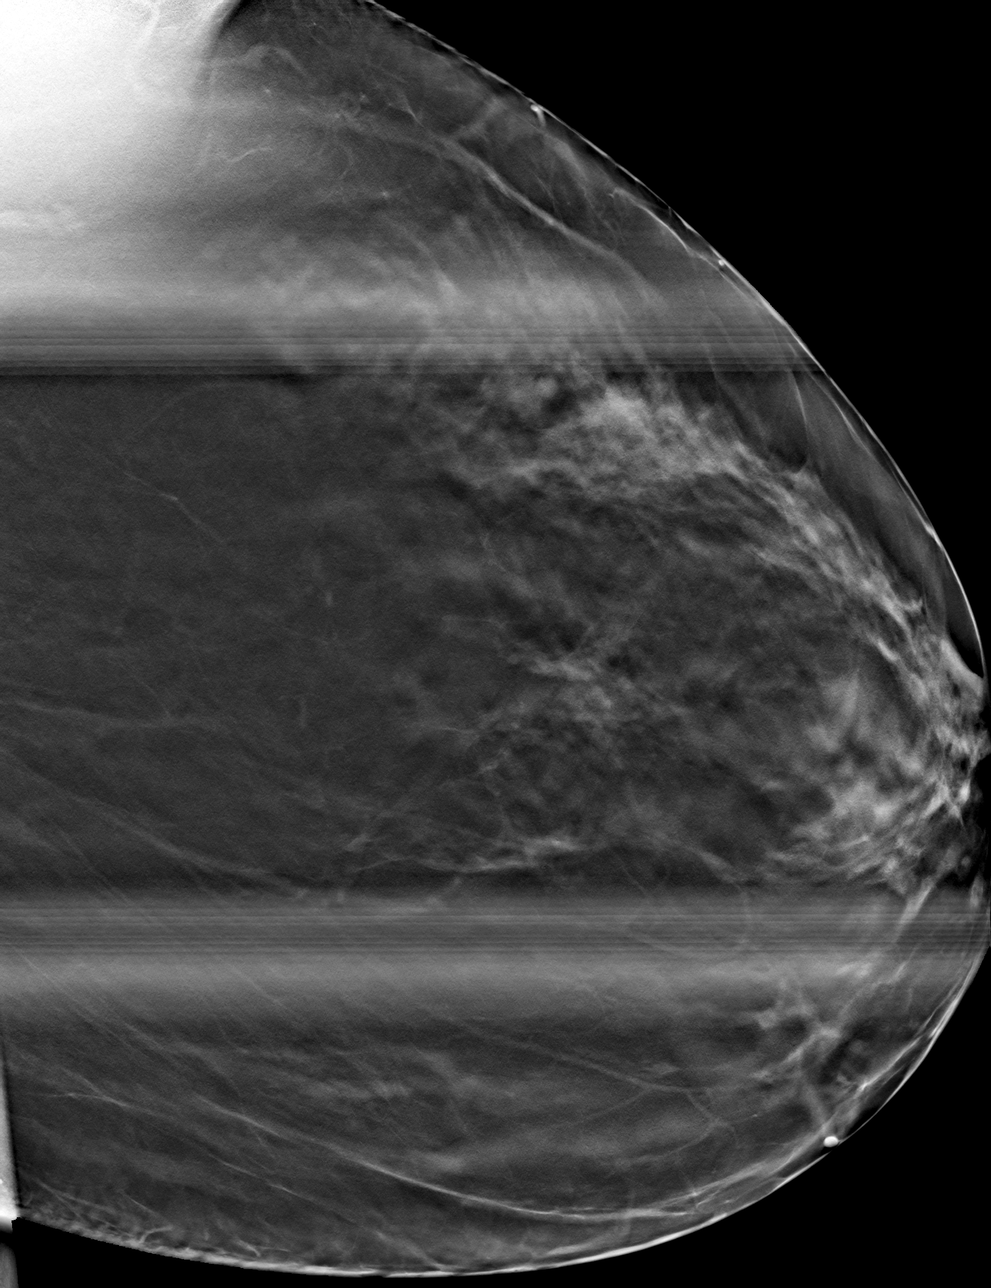

[4 of 12 positions shown; findings below may reference images not displayed]

FINDINGS: Spot compression tomograms were performed of the left breast. The
initially questioned possible left breast asymmetry resolves on the
additional imaging with findings compatible with an area of
overlapping fibroglandular tissue. There is no mammographic evidence
of malignancy in the left breast.
IMPRESSION: No mammographic evidence of malignancy in the left breast.

RECOMMENDATION:
Screening mammogram in one year.(Code:[id number])

I have discussed the findings and recommendations with the patient.
If applicable, a reminder letter will be sent to the patient
regarding the next appointment.

BI-RADS CATEGORY  1: Negative.

## 2022-04-24 NOTE — Progress Notes (Deleted)
GI Office Note    Referring Provider: Kathyrn Drown, MD Primary Care Physician:  Kathyrn Drown, MD  Primary Gastroenterologist: Elon Alas. Abbey Chatters, DO   Chief Complaint   No chief complaint on file.   History of Present Illness   Erica Graves is a 57 y.o. female presenting today for follow-up.  Seen in October 2023 for GERD, epigastric pain, chronic constipation.   EGD November 2023: -Mild Schatzki ring -Small hiatal hernia -Normal stomach -Normal duodenal bulb first portion of the duodenum and second portion of duodenum   Colonoscopy May 2015: 1.Normal mucosa in the terminal ileum 2. Mild diverticulosis in the transverse colon, descending colon, and sigmoid colon 3. The LEFT colon IS SLIGHTLY redundant 4. Small internal hemorrhoids  Medications   Current Outpatient Medications  Medication Sig Dispense Refill   amLODipine (NORVASC) 5 MG tablet Take 1 tablet (5 mg total) by mouth daily. 90 tablet 0   benzonatate (TESSALON) 100 MG capsule Take 1 capsule (100 mg total) by mouth every 8 (eight) hours. 21 capsule 0   cholecalciferol (VITAMIN D3) 25 MCG (1000 UNIT) tablet Take 1,000 Units by mouth daily.     indapamide (LOZOL) 1.25 MG tablet Take 1.25 mg by mouth daily.     Meloxicam 5 MG CAPS Take 5 mg by mouth daily. (Patient taking differently: Take 5 mg by mouth daily as needed (pain).) 30 capsule 0   naproxen (NAPROSYN) 500 MG tablet Take 1 tablet (500 mg total) by mouth 2 (two) times daily with a meal. 30 tablet 0   sertraline (ZOLOFT) 50 MG tablet Take one tab po qd 90 tablet 0   tiZANidine (ZANAFLEX) 4 MG tablet Take 1 tablet (4 mg total) by mouth every 8 (eight) hours as needed for muscle spasms. Do not take with alcohol or while driving or operating heavy machinery.  May cause drowsiness. 30 tablet 0   No current facility-administered medications for this visit.    Allergies   Allergies as of 04/25/2022 - Review Complete 04/12/2022  Allergen Reaction  Noted   Norco [hydrocodone-acetaminophen] Nausea And Vomiting 09/30/2014     Past Medical History   Past Medical History:  Diagnosis Date   Carpal tunnel syndrome, bilateral    Chronic low back pain    Constipation    Counseling for estrogen replacement therapy 07/07/2014   Depression with anxiety    Dyspareunia 07/07/2014   GAD (generalized anxiety disorder) 04/07/2020   History of kidney stones    Hot flashes 07/07/2014   Hypertension    Menopause 07/07/2014   Moody 07/07/2014   Obesity    Umbilical hernia     Past Surgical History   Past Surgical History:  Procedure Laterality Date   ABDOMINAL HYSTERECTOMY     BACK SURGERY     removal of cyst subdermal   CARPAL TUNNEL RELEASE Left 09/30/2014   Procedure: CARPAL TUNNEL RELEASE;  Surgeon: Carole Civil, MD;  Location: [facility];  Service: Orthopedics;  Laterality: Left;   CARPAL TUNNEL RELEASE Right 09/28/2015   Procedure: CARPAL TUNNEL RELEASE;  Surgeon: Carole Civil, MD;  Location: [facility];  Service: Orthopedics;  Laterality: Right;   COLONOSCOPY N/A 06/29/2013   Procedure: COLONOSCOPY;  Surgeon: Danie Binder, MD;  Location: [facility];  Service: Endoscopy;  Laterality: N/A;  1:30   ESOPHAGOGASTRODUODENOSCOPY (EGD) WITH PROPOFOL N/A 01/04/2022   Procedure: ESOPHAGOGASTRODUODENOSCOPY (EGD) WITH PROPOFOL;  Surgeon: Eloise Harman, DO;  Location: [facility];  Service: Endoscopy;  Laterality: N/A;  12:30pm, asa 2   KNEE ARTHROSCOPY Right    KNEE ARTHROSCOPY  05/21/2011   Procedure: ARTHROSCOPY KNEE;  Surgeon: Carole Civil, MD;  Location: [facility];  Service: Orthopedics;  Laterality: Left;  lateral menisectomy   KNEE CLOSED REDUCTION Left 05/26/2012   Procedure: CLOSED MANIPULATION KNEE;  Surgeon: Carole Civil, MD;  Location: [facility];  Service: Orthopedics;  Laterality: Left;   left knee arthroscopy  08/14/2005 Dr. Aline Brochure torn meniscus   LUMBAR SPINE SURGERY     PARTIAL HYSTERECTOMY     TOTAL KNEE  ARTHROPLASTY  03/31/2012   Procedure: TOTAL KNEE ARTHROPLASTY;  Surgeon: Carole Civil, MD;  Location: [facility];  Service: Orthopedics;  Laterality: Left;  Left Total Knee Arthroplasty   TRIGGER FINGER RELEASE Right 01/24/2017   Procedure: RELEASE TRIGGER FINGER/A-1 PULLEY right thumb;  Surgeon: Carole Civil, MD;  Location: [facility];  Service: Orthopedics;  Laterality: Right;    Past Family History   Family History  Problem Relation Age of Onset   Heart disease Mother    Hypertension Mother    Colon cancer Neg Hx     Past Social History   Social History   Socioeconomic History   Marital status: Married    Spouse name: Not on file   Number of children: 3   Years of education: college   Highest education level: Not on file  Occupational History   Occupation: school Consulting civil engineer    Employer: [place]    Employer: [employer]  Tobacco Use   Smoking status: Never   Smokeless tobacco: Never  Vaping Use   Vaping Use: Never used  Substance and Sexual Activity   Alcohol use: Yes    Comment: occ   Drug use: No   Sexual activity: Yes    Birth control/protection: Surgical    Comment: hyst  Other Topics Concern   Not on file  Social History Narrative   Not on file   Social Determinants of Health   Financial Resource Strain: Low Risk  (06/05/2019)   Overall Financial Resource Strain (CARDIA)    Difficulty of Paying Living Expenses: Not hard at all  Food Insecurity: No Food Insecurity (06/05/2019)   Hunger Vital Sign    Worried About Running Out of Food in the Last Year: Never true    Ran Out of Food in the Last Year: Never true  Transportation Needs: No Transportation Needs (06/05/2019)   PRAPARE - Hydrologist (Medical): No    Lack of Transportation (Non-Medical): No  Physical Activity: Inactive (06/05/2019)   Exercise Vital Sign    Days of Exercise per Week: 1 day    Minutes  of Exercise per Session: 0 min  Stress: Stress Concern Present (06/05/2019)   [place]    Feeling of Stress : To some extent  Social Connections: Socially Integrated (06/05/2019)   Social Connection and Isolation Panel [NHANES]    Frequency of Communication with Friends and Family: Twice a week    Frequency of Social Gatherings with Friends and Family: Once a week    Attends Religious Services: More than 4 times per year    Active Member of [employer] or Organizations: Yes    Attends Music therapist: More than 4 times per year    Marital Status: Married  Human resources officer Violence:  Not At Risk (06/05/2019)   Humiliation, Afraid, Rape, and Kick questionnaire    Fear of Current or Ex-Partner: No    Emotionally Abused: No    Physically Abused: No    Sexually Abused: No    Review of Systems   General: Negative for anorexia, weight loss, fever, chills, fatigue, weakness. ENT: Negative for hoarseness, difficulty swallowing , nasal congestion. CV: Negative for chest pain, angina, palpitations, dyspnea on exertion, peripheral edema.  Respiratory: Negative for dyspnea at rest, dyspnea on exertion, cough, sputum, wheezing.  GI: See history of present illness. GU:  Negative for dysuria, hematuria, urinary incontinence, urinary frequency, nocturnal urination.  Endo: Negative for unusual weight change.     Physical Exam   There were no vitals taken for this visit.   General: Well-nourished, well-developed in no acute distress.  Eyes: No icterus. Mouth: Oropharyngeal mucosa moist and pink , no lesions erythema or exudate. Lungs: Clear to auscultation bilaterally.  Heart: Regular rate and rhythm, no murmurs rubs or gallops.  Abdomen: Bowel sounds are normal, nontender, nondistended, no hepatosplenomegaly or masses,  no abdominal bruits or hernia , no rebound or guarding.  Rectal: ***  Extremities: No lower extremity  edema. No clubbing or deformities. Neuro: Alert and oriented x 4   Skin: Warm and dry, no jaundice.   Psych: Alert and cooperative, normal mood and affect.  Labs   *** Imaging Studies   DG Knee AP/LAT W/Sunrise Left  Result Date: 04/13/2022 Imaging left total knee at 10 years postop Normal alignment without loosening Impression stable left total knee at 10 years   CT Renal Stone Study  Result Date: 04/08/2022 CLINICAL DATA:  Abdominal/flank pain, stone suspected EXAM: CT ABDOMEN AND PELVIS WITHOUT CONTRAST TECHNIQUE: Multidetector CT imaging of the abdomen and pelvis was performed following the standard protocol without IV contrast. RADIATION DOSE REDUCTION: This exam was performed according to the departmental dose-optimization program which includes automated exposure control, adjustment of the mA and/or kV according to patient size and/or use of iterative reconstruction technique. COMPARISON:  None Available. FINDINGS: Lower chest: No acute abnormality. Hepatobiliary: No focal liver abnormality. Centrally gaseous gallstone noted within the gallbladder lumen. No gallbladder wall thickening or pericholecystic fluid. No biliary dilatation. Pancreas: No focal lesion. Normal pancreatic contour. No surrounding inflammatory changes. No main pancreatic ductal dilatation. Spleen: Normal in size without focal abnormality. Adrenals/Urinary Tract: No adrenal nodule bilaterally. No nephrolithiasis and no hydronephrosis. No definite contour-deforming renal mass. No ureterolithiasis or hydroureter. The urinary bladder is unremarkable. Stomach/Bowel: Stomach is within normal limits. No evidence of bowel wall thickening or dilatation. Colonic diverticulosis. Appendix appears normal. Vascular/Lymphatic: Phleboliths noted along the right ovarian vein. No abdominal aorta or iliac aneurysm. No abdominal, pelvic, or inguinal lymphadenopathy. Reproductive: Status post hysterectomy. No adnexal masses. Other: No  intraperitoneal free fluid. No intraperitoneal free gas. No organized fluid collection. Musculoskeletal: Small fat containing umbilical hernia with an abdominal defect of 1 cm. No suspicious lytic or blastic osseous lesions. No acute displaced fracture. Grade 1 anterolisthesis of L4 on L5. IMPRESSION: 1. Cholelithiasis with no CT evidence of acute cholecystitis. 2. Colonic diverticulosis with no acute diverticulitis. Electronically Signed   By: Iven Finn M.D.   On: 04/08/2022 21:54    Assessment       PLAN   ***   Laureen Ochs. Bobby Rumpf, [place], [facility]

## 2022-04-25 ENCOUNTER — Ambulatory Visit: Payer: BC Managed Care – PPO | Admitting: Gastroenterology

## 2022-04-25 ENCOUNTER — Encounter: Payer: Self-pay | Admitting: Gastroenterology

## 2022-04-26 ENCOUNTER — Encounter: Payer: Self-pay | Admitting: Radiology

## 2022-04-30 ENCOUNTER — Telehealth: Payer: Self-pay

## 2022-04-30 NOTE — Telephone Encounter (Signed)
Responded to case on site

## 2022-04-30 NOTE — Telephone Encounter (Signed)
Got a voicemail message from Shara Blazing with [employer] about case# [id number] [place] Wants confirmation about buy and bill for OrthoVisc.  Stated that you could give her a call at [phone]  ext# [phone] or update thru the portal.

## 2022-05-31 ENCOUNTER — Ambulatory Visit: Payer: BC Managed Care – PPO | Admitting: Orthopedic Surgery

## 2022-05-31 ENCOUNTER — Encounter: Payer: Self-pay | Admitting: Orthopedic Surgery

## 2022-05-31 DIAGNOSIS — M1711 Unilateral primary osteoarthritis, right knee: Secondary | ICD-10-CM

## 2022-05-31 NOTE — Progress Notes (Addendum)
The patient has consented to and requested hyaluronic acid injection in the form of  Orthovisc   Encounter Diagnosis  Name Primary?   Unilateral primary osteoarthritis, right knee Yes     The right knee is prepped with alcohol and ethyl chloride  The injection is performed with a 21-gauge needle, via inferolateral approach  No complications were noted  Appropriate instructions post injection were given  Return 1 week

## 2022-06-07 ENCOUNTER — Ambulatory Visit: Payer: BC Managed Care – PPO | Admitting: Orthopedic Surgery

## 2022-06-07 DIAGNOSIS — M1711 Unilateral primary osteoarthritis, right knee: Secondary | ICD-10-CM | POA: Diagnosis not present

## 2022-06-07 MED ORDER — HYALURONAN 30 MG/2ML IX SOSY
30.0000 mg | PREFILLED_SYRINGE | Freq: Once | INTRA_ARTICULAR | Status: AC
  Administered 2022-06-07: 30 mg via INTRA_ARTICULAR

## 2022-06-07 NOTE — Progress Notes (Signed)
Chief Complaint  Patient presents with   Injections    2nd orthovisc right knee    Orthovisc   The patient has consented to and requested hyaluronic acid injection in the form of  orthovisc   The right  knee is prepped with alcohol and ethyl chloride  The injection is performed with a 21-gauge needle, via inferolateral approach  No complications were noted  Appropriate instructions post injection were given

## 2022-06-14 ENCOUNTER — Ambulatory Visit: Payer: BC Managed Care – PPO | Admitting: Orthopedic Surgery

## 2022-06-14 ENCOUNTER — Encounter: Payer: Self-pay | Admitting: Orthopedic Surgery

## 2022-06-14 DIAGNOSIS — M1711 Unilateral primary osteoarthritis, right knee: Secondary | ICD-10-CM

## 2022-06-14 MED ORDER — HYALURONAN 30 MG/2ML IX SOSY
30.0000 mg | PREFILLED_SYRINGE | Freq: Once | INTRA_ARTICULAR | Status: AC
  Administered 2022-06-14: 30 mg via INTRA_ARTICULAR

## 2022-06-14 NOTE — Addendum Note (Signed)
Addended byCaffie Damme on: 06/14/2022 02:52 PM   Modules accepted: Orders

## 2022-06-14 NOTE — Progress Notes (Signed)
Chief Complaint  Patient presents with   Injections    Right knee orthovisc #3   The patient has consented to and requested hyaluronic acid injection in the form of  ORTHOVISC   Encounter Diagnosis  Name Primary?   Unilateral primary osteoarthritis, right knee Yes    The RIGHT  knee is prepped with alcohol and ethyl chloride  The injection is performed with a 21-gauge needle, via inferolateral approach  No complications were noted  Appropriate instructions post injection were given

## 2022-07-06 ENCOUNTER — Ambulatory Visit: Payer: BC Managed Care – PPO | Admitting: Nurse Practitioner

## 2022-07-06 ENCOUNTER — Encounter: Payer: Self-pay | Admitting: Nurse Practitioner

## 2022-07-06 VITALS — BP 137/84 | HR 67 | Temp 97.7°F | Ht 65.0 in | Wt 205.0 lb

## 2022-07-06 DIAGNOSIS — F341 Dysthymic disorder: Secondary | ICD-10-CM

## 2022-07-06 DIAGNOSIS — I10 Essential (primary) hypertension: Secondary | ICD-10-CM | POA: Diagnosis not present

## 2022-07-06 DIAGNOSIS — Z636 Dependent relative needing care at home: Secondary | ICD-10-CM | POA: Diagnosis not present

## 2022-07-06 MED ORDER — CLONAZEPAM 0.5 MG PO TABS: ORAL_TABLET | ORAL | 0 refills | Status: DC

## 2022-07-06 MED ORDER — ESCITALOPRAM OXALATE 10 MG PO TABS: 10.0000 mg | ORAL_TABLET | Freq: Every day | ORAL | 0 refills | Status: DC

## 2022-07-06 MED ORDER — AMLODIPINE BESYLATE 5 MG PO TABS: 5.0000 mg | ORAL_TABLET | Freq: Every day | ORAL | 1 refills | Status: AC

## 2022-07-06 MED ORDER — INDAPAMIDE 1.25 MG PO TABS: 1.2500 mg | ORAL_TABLET | Freq: Every day | ORAL | 1 refills | Status: DC

## 2022-07-06 NOTE — Patient Instructions (Signed)
Tdap vaccine Shingles vaccine

## 2022-07-08 ENCOUNTER — Encounter: Payer: Self-pay | Admitting: Nurse Practitioner

## 2022-07-08 NOTE — Progress Notes (Signed)
Subjective:    Patient ID: Erica Graves, female    DOB: 1965-03-21, 57 y.o.   MRN: [id number]  HPI Presents for recheck on her blood pressure and to discuss her stress/anxiety levels.  Had a BP of 180/110 about a week ago.  Had a headache at that time but no other symptoms.  She is taking care of of her elderly mother who has some confusion/dementia.  She is the main caregiver.  Has an aunt that comes over during the day so she can go to work.  The day of the elevated blood pressure there was a confrontation between her aunt and her mother where the aunt told her she felt like she could not help anymore.  Patient became very upset and stressed out.  Since then things have worked out and patient's BP at home 5 days ago was 134/70.  Tried sertraline but experienced headaches.   Review of Systems  Constitutional:  Positive for fatigue.  Respiratory:  Negative for cough, chest tightness and shortness of breath.   Cardiovascular:  Negative for chest pain and leg swelling.  Psychiatric/Behavioral:  Positive for sleep disturbance. Negative for self-injury and suicidal ideas. The patient is nervous/anxious.       07/06/2022    3:53 PM  Depression screen PHQ 2/9  Decreased Interest 0  Down, Depressed, Hopeless 1  PHQ - 2 Score 1  Altered sleeping 2  Tired, decreased energy 1  Change in appetite 0  Feeling bad or failure about yourself  0  Trouble concentrating 0  Moving slowly or fidgety/restless 0  Suicidal thoughts 0  PHQ-9 Score 4  Difficult doing work/chores Not difficult at all      07/06/2022    3:53 PM 04/21/2021    2:31 PM 05/05/2020    2:31 PM 04/07/2020    2:38 PM  GAD 7 : Generalized Anxiety Score  Nervous, Anxious, on Edge 0 0 1 3  Control/stop worrying 0 1 2 3   Worry too much - different things 0 1 2 3   Trouble relaxing 1 1 1 3   Restless 2 0 1 2  Easily annoyed or irritable 0 1 2 3   Afraid - awful might happen 0 0 1 1  Total GAD 7 Score 3 4 10 18   Anxiety Difficulty  Not difficult at all Not difficult at all           Objective:   Physical Exam NAD.  Alert, oriented.  Fatigued in appearance.  Calm affect.  Making good eye contact.  Speech clear.  Dressed appropriately for the weather.  Thoughts logical coherent and relevant.  Lungs clear.  Heart regular rate rhythm.  Lower extremities no edema. Today's Vitals   07/06/22 1554  BP: 137/84  Pulse: 67  Temp: 97.7 F (36.5 C)  SpO2: 98%  Weight: 205 lb (93 kg)  Height: 5\' 5"  (1.651 m)   Body mass index is 34.11 kg/m.        Assessment & Plan:   Problem List Items Addressed This Visit       Cardiovascular and Mediastinum   Essential hypertension, benign - Primary   Relevant Medications   amLODipine (NORVASC) 5 MG tablet   indapamide (LOZOL) 1.25 MG tablet     Other   Caregiver stress   DEPRESSION/ANXIETY   Relevant Medications   escitalopram (LEXAPRO) 10 MG tablet   Meds ordered this encounter  Medications   amLODipine (NORVASC) 5 MG tablet    Sig: Take 1  tablet (5 mg total) by mouth daily.    Dispense:  90 tablet    Refill:  1    Order Specific Question:   Supervising Provider    Answer:   Lilyan Punt A [[id number]]   indapamide (LOZOL) 1.25 MG tablet    Sig: Take 1 tablet (1.25 mg total) by mouth daily.    Dispense:  90 tablet    Refill:  1    Order Specific Question:   Supervising Provider    Answer:   Lilyan Punt A [[id number]]   escitalopram (LEXAPRO) 10 MG tablet    Sig: Take 1 tablet (10 mg total) by mouth daily.    Dispense:  30 tablet    Refill:  0    Order Specific Question:   Supervising Provider    Answer:   Lilyan Punt A [[id number]]   clonazePAM (KLONOPIN) 0.5 MG tablet    Sig: Take 1/2-1 tab po BID prn extreme anxiety    Dispense:  20 tablet    Refill:  0    Order Specific Question:   Supervising Provider    Answer:   Lilyan Punt A [[id number]]   Continue current antihypertensives as directed.  Discussed the role of stress on her general health especially blood  pressure.  Encourage patient to begin to make plans for her mother's care in case she can no longer handle this. Start escitalopram as directed.  Reviewed potential adverse effects.  Discontinue medication and contact office if any problems.  In addition patient given a small prescription for low-dose clonazepam, take 1/2-1 tab p.o. twice daily extreme anxiety especially elevated blood pressure. Return in about 1 month (around 08/06/2022). Call back sooner if needed.

## 2022-07-27 ENCOUNTER — Other Ambulatory Visit (HOSPITAL_COMMUNITY): Payer: Self-pay | Admitting: Family Medicine

## 2022-07-27 DIAGNOSIS — Z1231 Encounter for screening mammogram for malignant neoplasm of breast: Secondary | ICD-10-CM

## 2022-08-06 ENCOUNTER — Ambulatory Visit (HOSPITAL_COMMUNITY)
Admission: RE | Admit: 2022-08-06 | Discharge: 2022-08-06 | Disposition: A | Discharge status: Home / Self Care | Payer: BC Managed Care – PPO | Source: Ambulatory Visit | Attending: Family Medicine | Admitting: Family Medicine

## 2022-08-06 ENCOUNTER — Encounter (HOSPITAL_COMMUNITY): Payer: Self-pay

## 2022-08-06 DIAGNOSIS — Z1231 Encounter for screening mammogram for malignant neoplasm of breast: Secondary | ICD-10-CM | POA: Diagnosis present

## 2022-08-10 ENCOUNTER — Telehealth: Payer: BC Managed Care – PPO | Admitting: Nurse Practitioner

## 2022-08-10 ENCOUNTER — Encounter: Payer: Self-pay | Admitting: Nurse Practitioner

## 2022-08-10 DIAGNOSIS — Z636 Dependent relative needing care at home: Secondary | ICD-10-CM

## 2022-08-10 DIAGNOSIS — F411 Generalized anxiety disorder: Secondary | ICD-10-CM | POA: Diagnosis not present

## 2022-08-10 DIAGNOSIS — Z1329 Encounter for screening for other suspected endocrine disorder: Secondary | ICD-10-CM

## 2022-08-10 DIAGNOSIS — I10 Essential (primary) hypertension: Secondary | ICD-10-CM | POA: Diagnosis not present

## 2022-08-10 MED ORDER — ESCITALOPRAM OXALATE 10 MG PO TABS: 10.0000 mg | ORAL_TABLET | Freq: Every day | ORAL | 1 refills | Status: DC

## 2022-08-10 NOTE — Progress Notes (Signed)
   Subjective:    Patient ID: Erica Graves, female    DOB: [birth year], 57 y.o.   MRN: [id number]  Virtual Visit via Video Note  I connected with Erica Graves on 08/10/22 at 11:20 AM EDT by a video enabled telemedicine application and verified that I am speaking with the correct person using two identifiers.  Location: Patient: work Provider: office   I discussed the limitations of evaluation and management by telemedicine and the availability of in person appointments. The patient expressed understanding and agreed to proceed.  History of Present Illness: Presents for recheck on her anxiety.  Doing well on Lexapro 10 mg daily.  Denies any significant side effects.  Has not had to take her Klonopin.  Has not experienced any excessive anxiety.  States she feels much calmer.  Would like to continue medication at current dose.  Also note her BP outside of the office is 120/70.   Observations/Objective: NAD.  Alert, oriented.  Making good eye contact.  Calm cheerful affect.  Speech clear.  Thoughts logical coherent and relevant.  Assessment and Plan: Problem List Items Addressed This Visit       Cardiovascular and Mediastinum   Essential hypertension, benign   Relevant Orders   Comprehensive metabolic panel   Lipid panel     Other   Caregiver stress   GAD (generalized anxiety disorder) - Primary   Other Visit Diagnoses     Screening for thyroid disorder       Relevant Orders   TSH      Meds ordered this encounter  Medications   escitalopram (LEXAPRO) 10 MG tablet    Sig: Take 1 tablet (10 mg total) by mouth daily.    Dispense:  90 tablet    Refill:  1    Order Specific Question:   Supervising Provider    Answer:   Lilyan Punt A [[id number]]   Routine labs ordered. Continue Lexapro at same dose.  Refills given.     Follow Up Instructions: Return in about 6 months (around 02/09/2023).    I discussed the assessment and treatment plan with the patient. The patient  was provided an opportunity to ask questions and all were answered. The patient agreed with the plan and demonstrated an understanding of the instructions.   The patient was advised to call back or seek an in-person evaluation if the symptoms worsen or if the condition fails to improve as anticipated.  I provided 15 minutes of non-face-to-face time during this encounter.   Campbell Riches, NP

## 2022-08-31 ENCOUNTER — Ambulatory Visit: Payer: BC Managed Care – PPO | Admitting: Orthopedic Surgery

## 2022-08-31 ENCOUNTER — Encounter: Payer: Self-pay | Admitting: Orthopedic Surgery

## 2022-08-31 VITALS — BP 149/93 | HR 73 | Ht 65.0 in | Wt 198.0 lb

## 2022-08-31 DIAGNOSIS — M1711 Unilateral primary osteoarthritis, right knee: Secondary | ICD-10-CM | POA: Diagnosis not present

## 2022-08-31 DIAGNOSIS — Z01818 Encounter for other preprocedural examination: Secondary | ICD-10-CM | POA: Diagnosis not present

## 2022-08-31 MED ORDER — BUPIVACAINE-MELOXICAM ER 400-12 MG/14ML IJ SOLN: 400.0000 mg | Freq: Once | INTRAMUSCULAR | Status: AC

## 2022-08-31 NOTE — Patient Instructions (Signed)
Your surgery will be at [facility] by Dr Harrison  plan to be in hospital overnight. The hospital will contact you with a preoperative appointment to discuss Anesthesia.  Please arrive on time or 15 minutes early for the preoperative appointment, they have a very tight schedule if you are late or do not come in your surgery will be cancelled.  The phone number for the preop area is [phone]. Please bring your medications with you for the appointment. They will tell you the arrival time for surgery and medication instructions when you have your preoperative evaluation. Do not wear nail polish the day of your surgery and if you take Phentermine you need to stop this medication ONE WEEK prior to your surgery. f you take Invokana, Farxiga, Jardiance, or Steglatro) - Hold 72 hours before the procedure.  If you take Ozempic,  Bydureon or Trulicity do not take for 8 days before your surgery. If you take Victoza, Rybelsis, Saxenda or Adlyxi stop 24 hours before the procedure. Please arrive at the hospital 2 hours before procedure if scheduled at 9:30 or later in the day or at the time the nurse tells you at your preoperative visit.   If you have my chart do not use the time given in my chart use the time given to you by the nurse during your preoperative visit.   Your surgery  time may change. Please be available for phone calls the day of your surgery and the day before. The Short Stay department may need to discuss changes about your surgery time. Not reaching the you could lead to procedure delays and possible cancellation.  You must have a ride home and someone to stay with you for 24 to 48 hours. The person taking you home will receive and sign for the your discharge instructions.  Please be prepared to give your support person's name and telephone number to [facility]. Dr Harrison will need that name and phone number post procedure.   You will also get a call from a representative of [employer], they have a machine that you will use in the first few weeks after surgery. It is called a CPM.   You will have home physical therapy for 2 weeks after surgery, the home health agency will call you before or just following the surgery to set up visits. [facility] is the agency we normally use, unless you request another agency.   You will get a call also from outpatient therapy for therapy starting when the home therapy is done.  If you have questions or need to Reschedule the surgery, call the office ask for Antinette Keough.    

## 2022-08-31 NOTE — Progress Notes (Signed)
Chief Complaint  Patient presents with   Knee Pain    Right/ painful at work and also painful at night    57 yo female history of osteoarthritis right knee with progressively worsening pain  She has had anti-inflammatories, cortisone injections and hyaluronic acid injection which have not failed to relieve her pain she also tried activity modification home exercises she is kept her weight reasonable level.  She is in end-stage now with night pain that wakes her up she is ready to proceed with right total knee arthroplasty  She had a left total knee arthroplasty approximately 10 years ago and did well  Review of systems currently negative for chest pain shortness of breath numbness tingling GI discomfort urinary symptoms.  No trouble with anesthesia no evidence or history of easy bruising or bleeding  Problem list, medical hx, medications and allergies reviewed   Current Outpatient Medications  Medication Instructions   amLODipine (NORVASC) 5 mg, Oral, Daily   cholecalciferol (VITAMIN D3) 1,000 Units, Oral, Daily   clonazePAM (KLONOPIN) 0.5 MG tablet Take 1/2-1 tab po BID prn extreme anxiety   escitalopram (LEXAPRO) 10 mg, Oral, Daily   indapamide (LOZOL) 1.25 mg, Oral, Daily   naproxen (NAPROSYN) 500 mg, Oral, 2 times daily with meals   pantoprazole (PROTONIX) 40 mg, Oral, Daily    Allergies  Allergen Reactions   Norco [Hydrocodone-Acetaminophen] Nausea And Vomiting    BP (!) 149/93   Pulse 73   Ht 5\' 5"  (1.651 m)   Wt 198 lb (89.8 kg)   BMI 32.95 kg/m   Physical Exam  General normally built grooming and hygiene  Mental awake alert and oriented x 3 mood and affect normal  Neuro cranial nerves intact sensation normal reflexes normal  Vascular good pulse perfusion no edema normal color capillary refill  Musculoskeletal  Full extension and approximate 120 degrees of knee flexion with no instability sagittal or coronal plane medial joint line tenderness with small  effusion and skin envelope is normal   Imaging most recent images showed osteoarthritis with alignment still in valgus medial compartment with narrowing and bone spurs bone quality good  Encounter Diagnosis  Name Primary?   Unilateral primary osteoarthritis, right knee Yes   The procedure has been fully reviewed with the patient; The risks and benefits of surgery have been discussed and explained and understood. Alternative treatment has also been reviewed, questions were encouraged and answered. The postoperative plan is also been reviewed.  Right total knee arthroplasty

## 2022-09-04 ENCOUNTER — Encounter (HOSPITAL_COMMUNITY)
Admission: RE | Admit: 2022-09-04 | Discharge: 2022-09-04 | Disposition: A | Discharge status: Home / Self Care | Payer: BC Managed Care – PPO | Source: Ambulatory Visit | Attending: Orthopedic Surgery | Admitting: Orthopedic Surgery

## 2022-09-04 ENCOUNTER — Encounter (HOSPITAL_COMMUNITY): Payer: Self-pay

## 2022-09-04 VITALS — BP 149/88 | HR 73 | Temp 97.8°F | Resp 18 | Ht 65.0 in | Wt 198.0 lb

## 2022-09-04 DIAGNOSIS — M1711 Unilateral primary osteoarthritis, right knee: Secondary | ICD-10-CM | POA: Diagnosis not present

## 2022-09-04 DIAGNOSIS — Z01818 Encounter for other preprocedural examination: Secondary | ICD-10-CM | POA: Diagnosis not present

## 2022-09-04 DIAGNOSIS — I10 Essential (primary) hypertension: Secondary | ICD-10-CM | POA: Diagnosis not present

## 2022-09-04 LAB — CBC WITH DIFFERENTIAL/PLATELET
Abs Immature Granulocytes: 0 10*3/uL (ref 0.00–0.07)
Basophils Absolute: 0 10*3/uL (ref 0.0–0.1)
Basophils Relative: 1 %
Eosinophils Absolute: 0.1 10*3/uL (ref 0.0–0.5)
Eosinophils Relative: 3 %
HCT: 38.3 % (ref 36.0–46.0)
Hemoglobin: 12.5 g/dL (ref 12.0–15.0)
Immature Granulocytes: 0 %
Lymphocytes Relative: 56 %
Lymphs Abs: 2.8 10*3/uL (ref 0.7–4.0)
MCH: 29.1 pg (ref 26.0–34.0)
MCHC: 32.6 g/dL (ref 30.0–36.0)
MCV: 89.1 fL (ref 80.0–100.0)
Monocytes Absolute: 0.4 10*3/uL (ref 0.1–1.0)
Monocytes Relative: 8 %
Neutro Abs: 1.6 10*3/uL — ABNORMAL LOW (ref 1.7–7.7)
Neutrophils Relative %: 32 %
Platelets: 196 10*3/uL (ref 150–400)
RBC: 4.3 MIL/uL (ref 3.87–5.11)
RDW: 12.5 % (ref 11.5–15.5)
WBC: 4.9 10*3/uL (ref 4.0–10.5)
nRBC: 0 % (ref 0.0–0.2)

## 2022-09-04 LAB — BASIC METABOLIC PANEL
Anion gap: 7 (ref 5–15)
BUN: 19 mg/dL (ref 6–20)
CO2: 24 mmol/L (ref 22–32)
Calcium: 8.9 mg/dL (ref 8.9–10.3)
Chloride: 107 mmol/L (ref 98–111)
Creatinine, Ser: 0.77 mg/dL (ref 0.44–1.00)
GFR, Estimated: >60 mL/min (ref >60)
Glucose, Bld: 108 mg/dL — ABNORMAL HIGH (ref 70–99)
Potassium: 3.6 mmol/L (ref 3.5–5.1)
Sodium: 138 mmol/L (ref 135–145)

## 2022-09-04 LAB — SURGICAL PCR SCREEN
MRSA, PCR: NEGATIVE
Staphylococcus aureus: NEGATIVE

## 2022-09-04 LAB — PREPARE RBC (CROSSMATCH)

## 2022-09-04 NOTE — Patient Instructions (Signed)
Erica Graves  09/04/2022     @PREFPERIOPPHARMACY @   Your procedure is scheduled on  09/11/2022.   Report to [facility] at  0600  A.M.   Call this number if you have problems the morning of surgery:  [phone]  If you experience any cold or flu symptoms such as cough, fever, chills, shortness of breath, etc. between now and your scheduled surgery, please notify us at the above number.   Remember:  Do not eat or drink after midnight.      Take these medicines the morning of surgery with A SIP OF WATER                          amlodipine, pantoprazole.     Do not wear jewelry, make-up or nail polish, including gel polish,  artificial nails, or any other type of covering on natural nails (fingers and  toes).  Do not wear lotions, powders, or perfumes, or deodorant.  Do not shave 48 hours prior to surgery.  Men may shave face and neck.  Do not bring valuables to the hospital.  [facility] is not responsible for any belongings or valuables.  Contacts, dentures or bridgework may not be worn into surgery.  Leave your suitcase in the car.  After surgery it may be brought to your room.  For patients admitted to the hospital, discharge time will be determined by your treatment team.    Special instructions:   DO NOT smoke tobacco or vape for 24 hours before your procedure.  Please read over the following fact sheets that you were given. Pain Booklet, Coughing and Deep Breathing, Blood Transfusion Information, Lab Information, Total Joint Packet, Surgical Site Infection Prevention, Anesthesia Post-op Instructions, and Care and Recovery After Surgery      Total Knee Replacement, Care After This sheet gives you information about how to care for yourself after your procedure. Your health care provider may also give you more specific instructions. If you have problems or questions, contact your health care provider. What can I expect after the procedure? After the  procedure, it is common to have: Redness, pain, and swelling at the incision area. Stiffness. Discomfort. A small amount of blood or clear fluid coming from your incision. Follow these instructions at home: Medicines Take over-the-counter and prescription medicines only as told by your health care provider. If you were prescribed a blood thinner (anticoagulant), take it as told by your health care provider. Ask your health care provider if the medicine prescribed to you: Requires you to avoid driving or using machinery. Can cause constipation. You may need to take these actions to prevent or treat constipation: Drink enough fluid to keep your urine pale yellow. Take over-the-counter or prescription medicines. Eat foods that are high in fiber, such as beans, whole grains, and fresh fruits and vegetables. Limit foods that are high in fat and processed sugars, such as fried or sweet foods. Incision care  Follow instructions from your health care provider about how to take care of your incision. Make sure you: Wash your hands with soap and water for at least 20 seconds before and after you change your bandage (dressing). If soap and water are not available, use hand sanitizer. Change your dressing as told by your health care provider. Leave stitches (sutures), staples, skin glue, or adhesive strips in place. These skin closures may need to stay in place for 2 weeks  or longer. If adhesive strip edges start to loosen and curl up, you may trim the loose edges. Do not remove adhesive strips completely unless your health care provider tells you to do that. Do not take baths, swim, or use a hot tub until your health care provider approves. Check your incision area every day for signs of infection. Check for: More redness, swelling, or pain. More fluid or blood. Warmth. Pus or a bad smell. Activity Rest as told by your health care provider. Avoid sitting for a long time without moving. Get up to  take short walks every 1-2 hours. This is important to improve blood flow and breathing. Ask for help if you feel weak or unsteady. Follow instructions from your health care provider about using a walker, crutches, or a cane. You may use your legs to support (bear) your body weight as told by your health care provider. Follow instructions about how much weight you may safely support on your affected leg (weight-bearing restrictions). A physical therapist may show you how to get out of a bed and chair and how to go up and down stairs. You will first do this with a walker, crutches, or a cane and then without any of these devices. Once you are able to walk without a limp, you may stop using a walker, crutches, or a cane. Do exercises as told by your health care provider or physical therapist. Avoid high-impact activities, including running, jumping rope, and doing jumping jacks. Do not play contact sports until your health care provider approves. Return to your normal activities as told by your health care provider. Ask your health care provider what activities are safe for you. Managing pain, stiffness, and swelling  If directed, put ice on your knee. To do this: Put ice in a plastic bag or use the icing device (cold flow pad) that you were given. Follow instructions from your health care provider about how to use the icing device. Place a towel between your skin and the bag or between your skin and the icing device. Leave the ice on for 20 minutes, 2-3 times a day. Remove the ice if your skin turns bright red. This is very important. If you cannot feel pain, heat, or cold, you have a greater risk of damage to the area. Move your toes often to reduce stiffness and swelling. Raise (elevate) your leg above the level of your heart while you are sitting or lying down. Use several pillows to keep your leg straight. Do not put a pillow just under the knee. If the knee is bent for a long time, this may  lead to stiffness. Wear elastic knee support as told by your health care provider. Safety  To help prevent falls, keep floors clear of objects you may trip over. Place items that you may need within easy reach. Wear an apron or tool belt with pockets for carrying objects. This leaves your hands free to help with your balance. Ask your health care provider when it is safe to drive. General instructions Wear compression stockings as told by your health care provider. These stockings help to prevent blood clots and reduce swelling in your legs. Continue with breathing exercises. This helps prevent lung infection. Do not use any products that contain nicotine or tobacco. These products include cigarettes, chewing tobacco, and vaping devices, such as e-cigarettes. These can delay healing after surgery. If you need help quitting, ask your health care provider. Tell your health care provider if you  plan to have dental work. Also: Tell your dentist about your joint replacement. Ask your health care provider if there are any special instructions you need to follow before having dental care and routine cleanings. Keep all follow-up visits. This is important. Contact a health care provider if: You have a fever or chills. You have a cough or feel short of breath. Your medicine is not controlling your pain. You have any of these signs of infection: More redness, swelling, or pain around your incision. More fluid or blood coming from your incision. Warmth coming from your incision. Pus or a bad smell coming from your incision. You fall. Get help right away if: You have severe pain. You have trouble breathing. You have chest pain. You have redness, swelling, pain, or warmth in your calf or leg. Your incision breaks open after sutures or staples are removed. These symptoms may represent a serious problem that is an emergency. Do not wait to see if the symptoms will go away. Get medical help right  away. Call your local emergency services (911 in the [place] Do not drive yourself to the hospital. Summary After the procedure, it is common to have pain and swelling at the incision area, a small amount of blood or fluid coming from your incision, and stiffness. Follow instructions from your health care provider about how to take care of your incision. Use crutches, a walker, or a cane as told by your health care provider. This information is not intended to replace advice given to you by your health care provider. Make sure you discuss any questions you have with your health care provider. Document Revised: 08/04/2019 Document Reviewed: 08/04/2019 Elsevier Patient Education  2024 [employer] General Anesthesia, Adult, Care After The following information offers guidance on how to care for yourself after your procedure. Your health care provider may also give you more specific instructions. If you have problems or questions, contact your health care provider. What can I expect after the procedure? After the procedure, it is common for people to: Have pain or discomfort at the IV site. Have nausea or vomiting. Have a sore throat or hoarseness. Have trouble concentrating. Feel cold or chills. Feel weak, sleepy, or tired (fatigue). Have soreness and body aches. These can affect parts of the body that were not involved in surgery. Follow these instructions at home: For the time period you were told by your health care provider:  Rest. Do not participate in activities where you could fall or become injured. Do not drive or use machinery. Do not drink alcohol. Do not take sleeping pills or medicines that cause drowsiness. Do not make important decisions or sign legal documents. Do not take care of children on your own. General instructions Drink enough fluid to keep your urine pale yellow. If you have sleep apnea, surgery and certain medicines can increase your risk for breathing  problems. Follow instructions from your health care provider about wearing your sleep device: Anytime you are sleeping, including during daytime naps. While taking prescription pain medicines, sleeping medicines, or medicines that make you drowsy. Return to your normal activities as told by your health care provider. Ask your health care provider what activities are safe for you. Take over-the-counter and prescription medicines only as told by your health care provider. Do not use any products that contain nicotine or tobacco. These products include cigarettes, chewing tobacco, and vaping devices, such as e-cigarettes. These can delay incision healing after surgery. If you need help quitting, ask  your health care provider. Contact a health care provider if: You have nausea or vomiting that does not get better with medicine. You vomit every time you eat or drink. You have pain that does not get better with medicine. You cannot urinate or have bloody urine. You develop a skin rash. You have a fever. Get help right away if: You have trouble breathing. You have chest pain. You vomit blood. These symptoms may be an emergency. Get help right away. Call 911. Do not wait to see if the symptoms will go away. Do not drive yourself to the hospital. Summary After the procedure, it is common to have a sore throat, hoarseness, nausea, vomiting, or to feel weak, sleepy, or fatigue. For the time period you were told by your health care provider, do not drive or use machinery. Get help right away if you have difficulty breathing, have chest pain, or vomit blood. These symptoms may be an emergency. This information is not intended to replace advice given to you by your health care provider. Make sure you discuss any questions you have with your health care provider. Document Revised: 05/12/2021 Document Reviewed: 05/12/2021 Elsevier Patient Education  2024 [employer].

## 2022-09-05 NOTE — H&P (Signed)
Admission history and physical for right total knee arthroplasty  Diagnosis primary osteoarthritis right knee  Plan right total knee arthroplasty    Chief Complaint  Patient presents with   Knee Pain      Right/ painful at work and also painful at night     57 yo female history of osteoarthritis right knee with progressively worsening pain   She has had anti-inflammatories, cortisone injections and hyaluronic acid injection which have not failed to relieve her pain she also tried activity modification home exercises she is kept her weight reasonable level.  She is in end-stage now with night pain that wakes her up she is ready to proceed with right total knee arthroplasty   She had a left total knee arthroplasty approximately 10 years ago and did well   Review of systems currently negative for chest pain shortness of breath numbness tingling GI discomfort urinary symptoms.  No trouble with anesthesia no evidence or history of easy bruising or bleeding   Problem list, medical hx, medications and allergies reviewed        Current Outpatient Medications  Medication Instructions   amLODipine (NORVASC) 5 mg, Oral, Daily   cholecalciferol (VITAMIN D3) 1,000 Units, Oral, Daily   clonazePAM (KLONOPIN) 0.5 MG tablet Take 1/2-1 tab po BID prn extreme anxiety   escitalopram (LEXAPRO) 10 mg, Oral, Daily   indapamide (LOZOL) 1.25 mg, Oral, Daily   naproxen (NAPROSYN) 500 mg, Oral, 2 times daily with meals   pantoprazole (PROTONIX) 40 mg, Oral, Daily          Allergies  Allergen Reactions   Norco [Hydrocodone-Acetaminophen] Nausea And Vomiting    Past Medical History:  Diagnosis Date   Carpal tunnel syndrome, bilateral    Chronic low back pain    Constipation    Counseling for estrogen replacement therapy 07/07/2014   Depression with anxiety    Dyspareunia 07/07/2014   GAD (generalized anxiety disorder) 04/07/2020   History of kidney stones    Hot flashes 07/07/2014   Hypertension     Menopause 07/07/2014   Moody 07/07/2014   Obesity    Umbilical hernia    Past Surgical History:  Procedure Laterality Date   ABDOMINAL HYSTERECTOMY     BACK SURGERY     removal of cyst subdermal   CARPAL TUNNEL RELEASE Left 09/30/2014   Procedure: CARPAL TUNNEL RELEASE;  Surgeon: Vickki Hearing, MD;  Location: [facility];  Service: Orthopedics;  Laterality: Left;   CARPAL TUNNEL RELEASE Right 09/28/2015   Procedure: CARPAL TUNNEL RELEASE;  Surgeon: Vickki Hearing, MD;  Location: [facility];  Service: Orthopedics;  Laterality: Right;   COLONOSCOPY N/A 06/29/2013   Procedure: COLONOSCOPY;  Surgeon: West Bali, MD;  Location: [facility];  Service: Endoscopy;  Laterality: N/A;  1:30   ESOPHAGOGASTRODUODENOSCOPY (EGD) WITH PROPOFOL N/A 01/04/2022   Procedure: ESOPHAGOGASTRODUODENOSCOPY (EGD) WITH PROPOFOL;  Surgeon: Lanelle Bal, DO;  Location: [facility];  Service: Endoscopy;  Laterality: N/A;  12:30pm, asa 2   KNEE ARTHROSCOPY Right    KNEE ARTHROSCOPY  05/21/2011   Procedure: ARTHROSCOPY KNEE;  Surgeon: Vickki Hearing, MD;  Location: [facility];  Service: Orthopedics;  Laterality: Left;  lateral menisectomy   KNEE CLOSED REDUCTION Left 05/26/2012   Procedure: CLOSED MANIPULATION KNEE;  Surgeon: Vickki Hearing, MD;  Location: [facility];  Service: Orthopedics;  Laterality: Left;   left knee arthroscopy  08/14/2005 Dr. Romeo Apple torn meniscus   LUMBAR SPINE SURGERY  PARTIAL HYSTERECTOMY     TOTAL KNEE ARTHROPLASTY  03/31/2012   Procedure: TOTAL KNEE ARTHROPLASTY;  Surgeon: Vickki Hearing, MD;  Location: [facility];  Service: Orthopedics;  Laterality: Left;  Left Total Knee Arthroplasty   TRIGGER FINGER RELEASE Right 01/24/2017   Procedure: RELEASE TRIGGER FINGER/A-1 PULLEY right thumb;  Surgeon: Vickki Hearing, MD;  Location: [facility];  Service: Orthopedics;  Laterality: Right;   Family History  Problem Relation Age of Onset   Heart disease Mother    Hypertension Mother     Colon cancer Neg Hx    Social History   Tobacco Use   Smoking status: Never   Smokeless tobacco: Never  Vaping Use   Vaping Use: Never used  Substance Use Topics   Alcohol use: Yes    Comment: occ   Drug use: No    Allergies  Allergen Reactions   Norco [Hydrocodone-Acetaminophen] Nausea And Vomiting      BP (!) 149/93   Pulse 73   Ht 5\' 5"  (1.651 m)   Wt 198 lb (89.8 kg)   BMI 32.95 kg/m    Physical Exam   General normally built grooming and hygiene   Mental awake alert and oriented x 3 mood and affect normal   Neuro cranial nerves intact sensation normal reflexes normal   Vascular good pulse perfusion no edema normal color capillary refill   Musculoskeletal   Full extension and approximate 120 degrees of knee flexion with no instability sagittal or coronal plane medial joint line tenderness with small effusion and skin envelope is normal     Imaging most recent images showed osteoarthritis with alignment still in valgus medial compartment with narrowing and bone spurs bone quality good       Encounter Diagnosis  Name Primary?   Unilateral primary osteoarthritis, right knee Yes    The procedure has been fully reviewed with the patient; The risks and benefits of surgery have been discussed and explained and understood. Alternative treatment has also been reviewed, questions were encouraged and answered. The postoperative plan is also been reviewed.   Right total knee arthroplasty

## 2022-09-11 ENCOUNTER — Ambulatory Visit (HOSPITAL_COMMUNITY): Payer: BC Managed Care – PPO | Admitting: Anesthesiology

## 2022-09-11 ENCOUNTER — Ambulatory Visit (HOSPITAL_COMMUNITY): Payer: BC Managed Care – PPO

## 2022-09-11 ENCOUNTER — Other Ambulatory Visit: Payer: Self-pay

## 2022-09-11 ENCOUNTER — Encounter (HOSPITAL_COMMUNITY): Payer: Self-pay | Admitting: Orthopedic Surgery

## 2022-09-11 ENCOUNTER — Encounter (HOSPITAL_COMMUNITY)
Admission: RE | Disposition: A | Discharge status: Home Health | Payer: Self-pay | Source: Home / Self Care | Attending: Orthopedic Surgery

## 2022-09-11 ENCOUNTER — Observation Stay (HOSPITAL_COMMUNITY)
Admission: RE | Admit: 2022-09-11 | Discharge: 2022-09-12 | Disposition: A | Discharge status: Home Health | Payer: BC Managed Care – PPO | Attending: Orthopedic Surgery | Admitting: Orthopedic Surgery

## 2022-09-11 DIAGNOSIS — Z79899 Other long term (current) drug therapy: Secondary | ICD-10-CM | POA: Diagnosis not present

## 2022-09-11 DIAGNOSIS — Z96652 Presence of left artificial knee joint: Secondary | ICD-10-CM | POA: Diagnosis not present

## 2022-09-11 DIAGNOSIS — I10 Essential (primary) hypertension: Secondary | ICD-10-CM | POA: Insufficient documentation

## 2022-09-11 DIAGNOSIS — M1711 Unilateral primary osteoarthritis, right knee: Secondary | ICD-10-CM | POA: Diagnosis not present

## 2022-09-11 DIAGNOSIS — Z9889 Other specified postprocedural states: Secondary | ICD-10-CM

## 2022-09-11 HISTORY — PX: TOTAL KNEE ARTHROPLASTY: SHX125

## 2022-09-11 SURGERY — ARTHROPLASTY, KNEE, TOTAL
Anesthesia: Spinal | Site: Knee | Laterality: Right

## 2022-09-11 MED ORDER — HYDROMORPHONE HCL 1 MG/ML IJ SOLN: 0.2500 mg | INTRAMUSCULAR | Status: DC | PRN

## 2022-09-11 MED ORDER — PROPOFOL 500 MG/50ML IV EMUL
INTRAVENOUS | Status: DC | PRN
  Administered 2022-09-11: 125 ug/kg/min via INTRAVENOUS

## 2022-09-11 MED ORDER — CEFAZOLIN SODIUM-DEXTROSE 2-4 GM/100ML-% IV SOLN
2.0000 g | Freq: Four times a day (QID) | INTRAVENOUS | Status: AC
  Administered 2022-09-11 (×2): 2 g via INTRAVENOUS
  Filled 2022-09-11 (×2): qty 100

## 2022-09-11 MED ORDER — OXYCODONE HCL 5 MG PO TABS
5.0000 mg | ORAL_TABLET | ORAL | Status: DC
  Administered 2022-09-11 – 2022-09-12 (×8): 5 mg via ORAL
  Filled 2022-09-11 (×7): qty 1

## 2022-09-11 MED ORDER — OXYCODONE HCL 5 MG PO TABS
5.0000 mg | ORAL_TABLET | ORAL | Status: DC | PRN
  Filled 2022-09-11: qty 1

## 2022-09-11 MED ORDER — METHOCARBAMOL 500 MG PO TABS: 500.0000 mg | ORAL_TABLET | Freq: Four times a day (QID) | ORAL | Status: DC | PRN

## 2022-09-11 MED ORDER — GLYCOPYRROLATE PF 0.2 MG/ML IJ SOSY
PREFILLED_SYRINGE | INTRAMUSCULAR | Status: AC
  Filled 2022-09-11: qty 1

## 2022-09-11 MED ORDER — ASPIRIN 81 MG PO CHEW
81.0000 mg | CHEWABLE_TABLET | Freq: Two times a day (BID) | ORAL | Status: DC
  Administered 2022-09-11 – 2022-09-12 (×2): 81 mg via ORAL
  Filled 2022-09-11 (×2): qty 1

## 2022-09-11 MED ORDER — MIDAZOLAM HCL 2 MG/2ML IJ SOLN
2.0000 mg | Freq: Once | INTRAMUSCULAR | Status: AC
  Administered 2022-09-11: 2 mg via INTRAVENOUS
  Filled 2022-09-11: qty 2

## 2022-09-11 MED ORDER — LIDOCAINE HCL (PF) 1 % IJ SOLN
INTRAMUSCULAR | Status: AC
  Filled 2022-09-11: qty 30

## 2022-09-11 MED ORDER — ONDANSETRON HCL 4 MG PO TABS
4.0000 mg | ORAL_TABLET | Freq: Four times a day (QID) | ORAL | Status: DC | PRN
  Administered 2022-09-12 (×2): 4 mg via ORAL
  Filled 2022-09-11 (×2): qty 1

## 2022-09-11 MED ORDER — METHOCARBAMOL 1000 MG/10ML IJ SOLN: 500.0000 mg | Freq: Four times a day (QID) | INTRAVENOUS | Status: DC | PRN

## 2022-09-11 MED ORDER — METHOCARBAMOL 1000 MG/10ML IJ SOLN
500.0000 mg | Freq: Four times a day (QID) | INTRAVENOUS | Status: DC
  Administered 2022-09-11 – 2022-09-12 (×4): 500 mg via INTRAVENOUS
  Filled 2022-09-11 (×2): qty 5

## 2022-09-11 MED ORDER — CHLORHEXIDINE GLUCONATE 0.12 % MT SOLN
OROMUCOSAL | Status: AC
  Administered 2022-09-11: 15 mL via OROMUCOSAL
  Filled 2022-09-11: qty 15

## 2022-09-11 MED ORDER — DEXAMETHASONE SODIUM PHOSPHATE 10 MG/ML IJ SOLN
INTRAMUSCULAR | Status: AC
  Filled 2022-09-11: qty 1

## 2022-09-11 MED ORDER — ROPIVACAINE HCL 5 MG/ML IJ SOLN
INTRAMUSCULAR | Status: DC | PRN
  Administered 2022-09-11: 28 mL via PERINEURAL

## 2022-09-11 MED ORDER — CEFAZOLIN SODIUM-DEXTROSE 2-4 GM/100ML-% IV SOLN
2.0000 g | INTRAVENOUS | Status: AC
  Administered 2022-09-11: 2 g via INTRAVENOUS
  Filled 2022-09-11: qty 100

## 2022-09-11 MED ORDER — FENTANYL CITRATE PF 50 MCG/ML IJ SOSY
50.0000 ug | PREFILLED_SYRINGE | Freq: Once | INTRAMUSCULAR | Status: AC
  Administered 2022-09-11: 50 ug via INTRAVENOUS
  Filled 2022-09-11: qty 1

## 2022-09-11 MED ORDER — LIDOCAINE HCL (PF) 1 % IJ SOLN
INTRAMUSCULAR | Status: DC | PRN
  Administered 2022-09-11: 4 mL

## 2022-09-11 MED ORDER — BUPIVACAINE-MELOXICAM ER 200-6 MG/7ML IJ SOLN
INTRAMUSCULAR | Status: AC
  Filled 2022-09-11: qty 2

## 2022-09-11 MED ORDER — METOCLOPRAMIDE HCL 10 MG PO TABS: 5.0000 mg | ORAL_TABLET | Freq: Three times a day (TID) | ORAL | Status: DC | PRN

## 2022-09-11 MED ORDER — PREGABALIN 50 MG PO CAPS
50.0000 mg | ORAL_CAPSULE | Freq: Three times a day (TID) | ORAL | Status: DC
  Administered 2022-09-11 – 2022-09-12 (×5): 50 mg via ORAL
  Filled 2022-09-11 (×5): qty 1

## 2022-09-11 MED ORDER — POLYETHYLENE GLYCOL 3350 17 G PO PACK: 17.0000 g | PACK | Freq: Every day | ORAL | Status: DC | PRN

## 2022-09-11 MED ORDER — PNEUMOCOCCAL 20-VAL CONJ VACC 0.5 ML IM SUSY: 0.5000 mL | PREFILLED_SYRINGE | INTRAMUSCULAR | Status: DC

## 2022-09-11 MED ORDER — 0.9 % SODIUM CHLORIDE (POUR BTL) OPTIME
TOPICAL | Status: DC | PRN
  Administered 2022-09-11: 1000 mL

## 2022-09-11 MED ORDER — DOCUSATE SODIUM 100 MG PO CAPS
100.0000 mg | ORAL_CAPSULE | Freq: Two times a day (BID) | ORAL | Status: DC
  Administered 2022-09-11 – 2022-09-12 (×2): 100 mg via ORAL
  Filled 2022-09-11 (×2): qty 1

## 2022-09-11 MED ORDER — CELECOXIB 100 MG PO CAPS
200.0000 mg | ORAL_CAPSULE | Freq: Every day | ORAL | Status: DC
  Filled 2022-09-11 (×2): qty 2

## 2022-09-11 MED ORDER — POVIDONE-IODINE 10 % EX SWAB
2.0000 | Freq: Once | CUTANEOUS | Status: AC
  Administered 2022-09-11: 2 via TOPICAL

## 2022-09-11 MED ORDER — BUPIVACAINE IN DEXTROSE 0.75-8.25 % IT SOLN
INTRATHECAL | Status: DC | PRN
  Administered 2022-09-11: 1.8 mL via INTRATHECAL

## 2022-09-11 MED ORDER — MEPERIDINE HCL 50 MG/ML IJ SOLN: 6.2500 mg | INTRAMUSCULAR | Status: DC | PRN

## 2022-09-11 MED ORDER — PHENYLEPHRINE 80 MCG/ML (10ML) SYRINGE FOR IV PUSH (FOR BLOOD PRESSURE SUPPORT)
PREFILLED_SYRINGE | INTRAVENOUS | Status: AC
  Filled 2022-09-11: qty 10

## 2022-09-11 MED ORDER — CELECOXIB 100 MG PO CAPS
200.0000 mg | ORAL_CAPSULE | Freq: Two times a day (BID) | ORAL | Status: DC
  Administered 2022-09-11 – 2022-09-12 (×3): 200 mg via ORAL
  Filled 2022-09-11 (×3): qty 2

## 2022-09-11 MED ORDER — TRAMADOL HCL 50 MG PO TABS
50.0000 mg | ORAL_TABLET | Freq: Four times a day (QID) | ORAL | Status: DC
  Administered 2022-09-11 – 2022-09-12 (×4): 50 mg via ORAL
  Filled 2022-09-11 (×4): qty 1

## 2022-09-11 MED ORDER — DEXAMETHASONE SODIUM PHOSPHATE 4 MG/ML IJ SOLN
INTRAMUSCULAR | Status: DC | PRN
  Administered 2022-09-11: 8 mg via PERINEURAL

## 2022-09-11 MED ORDER — MIDAZOLAM HCL 2 MG/2ML IJ SOLN
INTRAMUSCULAR | Status: AC
  Filled 2022-09-11: qty 2

## 2022-09-11 MED ORDER — PROPOFOL 500 MG/50ML IV EMUL
INTRAVENOUS | Status: AC
  Filled 2022-09-11: qty 50

## 2022-09-11 MED ORDER — ONDANSETRON HCL 4 MG/2ML IJ SOLN
4.0000 mg | Freq: Four times a day (QID) | INTRAMUSCULAR | Status: DC
  Administered 2022-09-11 – 2022-09-12 (×3): 4 mg via INTRAVENOUS
  Filled 2022-09-11 (×2): qty 2

## 2022-09-11 MED ORDER — TRANEXAMIC ACID-NACL 1000-0.7 MG/100ML-% IV SOLN
1000.0000 mg | INTRAVENOUS | Status: AC
  Administered 2022-09-11: 1000 mg via INTRAVENOUS
  Filled 2022-09-11: qty 100

## 2022-09-11 MED ORDER — BUPIVACAINE-MELOXICAM ER 200-6 MG/7ML IJ SOLN
INTRAMUSCULAR | Status: DC | PRN
  Administered 2022-09-11: 400 mg

## 2022-09-11 MED ORDER — TRANEXAMIC ACID-NACL 1000-0.7 MG/100ML-% IV SOLN
1000.0000 mg | Freq: Once | INTRAVENOUS | Status: AC
  Administered 2022-09-11: 1000 mg via INTRAVENOUS
  Filled 2022-09-11: qty 100

## 2022-09-11 MED ORDER — PROPOFOL 500 MG/50ML IV EMUL
INTRAVENOUS | Status: AC
  Filled 2022-09-11: qty 100

## 2022-09-11 MED ORDER — PANTOPRAZOLE SODIUM 40 MG PO TBEC
40.0000 mg | DELAYED_RELEASE_TABLET | Freq: Every day | ORAL | Status: DC
  Administered 2022-09-11 – 2022-09-12 (×2): 40 mg via ORAL
  Filled 2022-09-11 (×2): qty 1

## 2022-09-11 MED ORDER — ADULT MULTIVITAMIN W/MINERALS CH
1.0000 | ORAL_TABLET | Freq: Every day | ORAL | Status: DC
  Administered 2022-09-11 – 2022-09-12 (×2): 1 via ORAL
  Filled 2022-09-11 (×2): qty 1

## 2022-09-11 MED ORDER — KETAMINE HCL 50 MG/5ML IJ SOSY
PREFILLED_SYRINGE | INTRAMUSCULAR | Status: AC
  Filled 2022-09-11: qty 5

## 2022-09-11 MED ORDER — DEXAMETHASONE SODIUM PHOSPHATE 10 MG/ML IJ SOLN
10.0000 mg | Freq: Once | INTRAMUSCULAR | Status: AC
  Administered 2022-09-12: 10 mg via INTRAVENOUS
  Filled 2022-09-11: qty 1

## 2022-09-11 MED ORDER — ALUM & MAG HYDROXIDE-SIMETH 200-200-20 MG/5ML PO SUSP: 30.0000 mL | ORAL | Status: DC | PRN

## 2022-09-11 MED ORDER — AMLODIPINE BESYLATE 5 MG PO TABS
5.0000 mg | ORAL_TABLET | Freq: Every day | ORAL | Status: DC
  Administered 2022-09-11 – 2022-09-12 (×2): 5 mg via ORAL
  Filled 2022-09-11 (×2): qty 1

## 2022-09-11 MED ORDER — GLYCOPYRROLATE PF 0.2 MG/ML IJ SOSY
PREFILLED_SYRINGE | INTRAMUSCULAR | Status: DC | PRN
  Administered 2022-09-11: .2 mg via INTRAVENOUS

## 2022-09-11 MED ORDER — ACETAMINOPHEN 325 MG PO TABS: 325.0000 mg | ORAL_TABLET | Freq: Four times a day (QID) | ORAL | Status: DC | PRN

## 2022-09-11 MED ORDER — PHENOL 1.4 % MT LIQD: 1.0000 | OROMUCOSAL | Status: DC | PRN

## 2022-09-11 MED ORDER — METOCLOPRAMIDE HCL 5 MG/ML IJ SOLN
5.0000 mg | Freq: Three times a day (TID) | INTRAMUSCULAR | Status: DC | PRN
  Administered 2022-09-11 – 2022-09-12 (×2): 5 mg via INTRAVENOUS
  Filled 2022-09-11 (×2): qty 2

## 2022-09-11 MED ORDER — ORAL CARE MOUTH RINSE: 15.0000 mL | Freq: Once | OROMUCOSAL | Status: AC

## 2022-09-11 MED ORDER — ROPIVACAINE HCL 5 MG/ML IJ SOLN
INTRAMUSCULAR | Status: AC
  Filled 2022-09-11: qty 30

## 2022-09-11 MED ORDER — SODIUM CHLORIDE 0.9 % IR SOLN
Status: DC | PRN
  Administered 2022-09-11: 3000 mL

## 2022-09-11 MED ORDER — PHENYLEPHRINE HCL-NACL 20-0.9 MG/250ML-% IV SOLN
INTRAVENOUS | Status: DC | PRN
  Administered 2022-09-11: 20 ug/min via INTRAVENOUS

## 2022-09-11 MED ORDER — HYDROMORPHONE HCL 1 MG/ML IJ SOLN: 0.5000 mg | INTRAMUSCULAR | Status: DC | PRN

## 2022-09-11 MED ORDER — VITAMIN D 25 MCG (1000 UNIT) PO TABS
1000.0000 [IU] | ORAL_TABLET | Freq: Every day | ORAL | Status: DC
  Administered 2022-09-11 – 2022-09-12 (×2): 1000 [IU] via ORAL
  Filled 2022-09-11 (×2): qty 1

## 2022-09-11 MED ORDER — CHLORHEXIDINE GLUCONATE 0.12 % MT SOLN: 15.0000 mL | Freq: Once | OROMUCOSAL | Status: AC

## 2022-09-11 MED ORDER — MENTHOL 3 MG MT LOZG: 1.0000 | LOZENGE | OROMUCOSAL | Status: DC | PRN

## 2022-09-11 MED ORDER — LACTATED RINGERS IV SOLN: INTRAVENOUS | Status: DC

## 2022-09-11 MED ORDER — ONDANSETRON HCL 4 MG/2ML IJ SOLN
4.0000 mg | Freq: Four times a day (QID) | INTRAMUSCULAR | Status: DC | PRN
  Filled 2022-09-11: qty 2

## 2022-09-11 MED ORDER — MIDAZOLAM HCL 5 MG/5ML IJ SOLN
INTRAMUSCULAR | Status: DC | PRN
  Administered 2022-09-11 (×2): 1 mg via INTRAVENOUS

## 2022-09-11 MED ORDER — KETAMINE HCL 10 MG/ML IJ SOLN
INTRAMUSCULAR | Status: DC | PRN
  Administered 2022-09-11 (×3): 10 mg via INTRAVENOUS
  Administered 2022-09-11: 20 mg via INTRAVENOUS

## 2022-09-11 MED ORDER — ONDANSETRON HCL 4 MG/2ML IJ SOLN
INTRAMUSCULAR | Status: DC | PRN
  Administered 2022-09-11: 8 mg via INTRAVENOUS

## 2022-09-11 MED ORDER — DEXAMETHASONE SODIUM PHOSPHATE 4 MG/ML IJ SOLN
INTRAMUSCULAR | Status: AC
  Filled 2022-09-11: qty 2

## 2022-09-11 MED ORDER — ONDANSETRON HCL 4 MG/2ML IJ SOLN: 4.0000 mg | Freq: Once | INTRAMUSCULAR | Status: DC | PRN

## 2022-09-11 MED ORDER — PHENYLEPHRINE HCL (PRESSORS) 10 MG/ML IV SOLN
INTRAVENOUS | Status: DC | PRN
  Administered 2022-09-11: 80 ug via INTRAVENOUS

## 2022-09-11 MED ORDER — SODIUM CHLORIDE 0.9 % IV SOLN: INTRAVENOUS | Status: DC

## 2022-09-11 MED ORDER — ONDANSETRON HCL 4 MG/2ML IJ SOLN
INTRAMUSCULAR | Status: AC
  Filled 2022-09-11: qty 4

## 2022-09-11 SURGICAL SUPPLY — 62 items
ATTUNE PSFEM RTSZ5 NARCEM KNEE (Femur) IMPLANT
BANDAGE ESMARK 6X9 LF (GAUZE/BANDAGES/DRESSINGS) ×2 IMPLANT
BASEPLATE TIB CMT FB PCKT SZ4 (Stem) IMPLANT
BLADE SAGITTAL 25.0X1.27X90 (BLADE) ×2 IMPLANT
BLADE SAW SGTL 11.0X1.19X90.0M (BLADE) ×2 IMPLANT
BLADE SURG SZ10 CARB STEEL (BLADE) ×2 IMPLANT
BNDG CMPR 9X6 STRL LF SNTH (GAUZE/BANDAGES/DRESSINGS) ×1
BNDG ESMARK 6X9 LF (GAUZE/BANDAGES/DRESSINGS) ×1
BSPLAT TIB 4 CMNT FX BRNG STRL (Stem) ×1 IMPLANT
CEMENT HV SMART SET (Cement) ×4 IMPLANT
CLOTH BEACON ORANGE TIMEOUT ST (SAFETY) ×2 IMPLANT
COOLER ICEMAN CLASSIC (MISCELLANEOUS) ×2 IMPLANT
COVER LIGHT HANDLE STERIS (MISCELLANEOUS) ×4 IMPLANT
CUFF TOURN SGL QUICK 34 (TOURNIQUET CUFF) ×1
CUFF TRNQT CYL 34X4.125X (TOURNIQUET CUFF) ×2 IMPLANT
DRAPE BACK TABLE (DRAPES) ×2 IMPLANT
DRAPE EXTREMITY T 121X128X90 (DISPOSABLE) ×2 IMPLANT
DRESSING AQUACEL AG ADV 3.5X12 (MISCELLANEOUS) ×2 IMPLANT
DRSG AQUACEL AG ADV 3.5X12 (MISCELLANEOUS) ×1
DURAPREP 26ML APPLICATOR (WOUND CARE) ×4 IMPLANT
ELECT REM PT RETURN 9FT ADLT (ELECTROSURGICAL) ×1
ELECTRODE REM PT RTRN 9FT ADLT (ELECTROSURGICAL) ×2 IMPLANT
GLOVE BIO SURGEON STRL SZ7 (GLOVE) IMPLANT
GLOVE BIOGEL PI IND STRL 6.5 (GLOVE) IMPLANT
GLOVE BIOGEL PI IND STRL 7.0 (GLOVE) ×6 IMPLANT
GLOVE BIOGEL PI IND STRL 8.5 (GLOVE) ×2 IMPLANT
GLOVE ECLIPSE 6.5 STRL STRAW (GLOVE) IMPLANT
GLOVE ECLIPSE 7.0 STRL STRAW (GLOVE) IMPLANT
GLOVE SKINSENSE STRL SZ8.0 LF (GLOVE) ×2 IMPLANT
GOWN STRL REUS W/TWL LRG LVL3 (GOWN DISPOSABLE) ×6 IMPLANT
GOWN STRL REUS W/TWL XL LVL3 (GOWN DISPOSABLE) ×2 IMPLANT
HANDPIECE INTERPULSE COAX TIP (DISPOSABLE) ×1
HOOD W/PEELAWAY (MISCELLANEOUS) ×8 IMPLANT
INSERT TIB ATTUNE FB SZ5X6 (Insert) IMPLANT
INST SET MAJOR BONE (KITS) ×2 IMPLANT
IV NS IRRIG 3000ML ARTHROMATIC (IV SOLUTION) ×2 IMPLANT
KIT BLADEGUARD II DBL (SET/KITS/TRAYS/PACK) ×2 IMPLANT
KIT TURNOVER KIT A (KITS) ×2 IMPLANT
MANIFOLD NEPTUNE II (INSTRUMENTS) ×2 IMPLANT
MARKER SKIN DUAL TIP RULER LAB (MISCELLANEOUS) ×2 IMPLANT
NS IRRIG 1000ML POUR BTL (IV SOLUTION) ×2 IMPLANT
PACK TOTAL JOINT (CUSTOM PROCEDURE TRAY) ×2 IMPLANT
PAD ARMBOARD 7.5X6 YLW CONV (MISCELLANEOUS) ×2 IMPLANT
PAD COLD SHLDR SM WRAP-ON (PAD) ×2 IMPLANT
PATELLA MEDIAL ATTUN 35MM KNEE (Knees) IMPLANT
PILLOW KNEE EXTENSION 0 DEG (MISCELLANEOUS) ×2 IMPLANT
PIN/DRILL PACK ORTHO 1/8X3.0 (PIN) ×2 IMPLANT
POSITIONER HEAD 8X9X4 ADT (SOFTGOODS) ×2 IMPLANT
SAW OSC TIP CART 19.5X105X1.3 (SAW) ×2 IMPLANT
SET BASIN LINEN APH (SET/KITS/TRAYS/PACK) ×2 IMPLANT
SET HNDPC FAN SPRY TIP SCT (DISPOSABLE) ×2 IMPLANT
SOLUTION IRRIG SURGIPHOR (IV SOLUTION) ×2 IMPLANT
STAPLER VISISTAT 35W (STAPLE) ×2 IMPLANT
SUT BRALON NAB BRD #1 30IN (SUTURE) ×2 IMPLANT
SUT MNCRL 0 VIOLET CTX 36 (SUTURE) ×2 IMPLANT
SUT MON AB 0 CT1 (SUTURE) ×2 IMPLANT
SYR BULB IRRIG 60ML STRL (SYRINGE) ×2 IMPLANT
TOWEL OR 17X26 4PK STRL BLUE (TOWEL DISPOSABLE) ×2 IMPLANT
TOWER CARTRIDGE SMART MIX (DISPOSABLE) ×2 IMPLANT
TRAY FOLEY MTR SLVR 16FR STAT (SET/KITS/TRAYS/PACK) ×2 IMPLANT
WATER STERILE IRR 1000ML POUR (IV SOLUTION) ×4 IMPLANT
YANKAUER SUCT 12FT TUBE ARGYLE (SUCTIONS) ×2 IMPLANT

## 2022-09-11 NOTE — Anesthesia Procedure Notes (Addendum)
Spinal  Patient location during procedure: OR Start time: 09/11/2022 7:40 AM End time: 09/11/2022 7:45 AM Reason for block: surgical anesthesia Staffing Performed: resident/CRNA  Anesthesiologist: Molli Barrows, MD Resident/CRNA: Jeanette Caprice, CRNA Performed by: Jeanette Caprice, CRNA Authorized by: Molli Barrows, MD   Preanesthetic Checklist Completed: patient identified, IV checked, site marked, risks and benefits discussed, surgical consent, monitors and equipment checked, pre-op evaluation and timeout performed Spinal Block Patient position: sitting Prep: ChloraPrep Patient monitoring: heart rate, cardiac monitor, continuous pulse ox and blood pressure Approach: midline Location: L2-3 Injection technique: single-shot Needle Needle type: Pencan  Needle gauge: 25 G Needle length: 10 cm Assessment Sensory level: T8 Events: CSF return Additional Notes Unable to pass spinal needle @ L3-L4.  Easy placement without resistance @ L2-L3.  Clear CSF returned.  + CSF swirl in syringe.  1.8 mL 0.75% heavy marcaine injected slowly.

## 2022-09-11 NOTE — Brief Op Note (Signed)
09/11/2022  10:02 AM  PATIENT:  [name]  57 y.o. female  PRE-OPERATIVE DIAGNOSIS:  Right knee osteoarthritis  POST-OPERATIVE DIAGNOSIS:  Right knee osteoarthritis  PROCEDURE:  Procedure(s): TOTAL KNEE ARTHROPLASTY (Right)  SURGEON:  Surgeons and Role:    Vickki Hearing, MD - Primary  PHYSICIAN ASSISTANT:   ASSISTANTS: cynthia and angela   ANESTHESIA:   spinal and aphenous n block  EBL:  5 mL   BLOOD ADMINISTERED:none  DRAINS: none   LOCAL MEDICATIONS USED:  OTHER zinrelef  SPECIMEN:  No Specimen  DISPOSITION OF SPECIMEN:  N/A  COUNTS:  YES  TOURNIQUET:   Total Tourniquet Time Documented: Thigh (Right) - 74 minutes Total: Thigh (Right) - 74 minutes   DICTATION: .Reubin Milan Dictation  PLAN OF CARE: Admit for overnight observation  PATIENT DISPOSITION:  PACU - hemodynamically stable.   Delay start of Pharmacological VTE agent (>24hrs) due to surgical blood loss or risk of bleeding: yes

## 2022-09-11 NOTE — Evaluation (Signed)
Physical Therapy Evaluation Patient Details Name: Erica Graves MRN: [id number] DOB: 25-Feb-1966 Today's Date: 09/11/2022   RIGHT KNEE ROM:  0 - 88 degrees AMBULATION DISTANCE: 3 feet using RW with Min assist   History of Present Illness  EVIN CHIRCO is a 57 y/o female, s/p Right TKA on 09/11/22, with the diagnosis of Right knee osteoarthritis.  Clinical Impression  Patient presents with c/o severe nausea and later dizziness when standing and limited to a few steps at bedside before having to sit due and declined to attempt sitting in chair or walking in room due to her symptoms.  Patient instructed in and give HEP with good carryover demonstrated and encouraged to get up with nursing staff to chair to eat dinner if she feels better.  Patient will benefit from continued skilled physical therapy in hospital and recommended venue below to increase strength, balance, endurance for safe ADLs and gait.       Assistance Recommended at Discharge Set up Supervision/Assistance  If plan is discharge home, recommend the following:  Can travel by private vehicle  A little help with walking and/or transfers;A little help with bathing/dressing/bathroom;Help with stairs or ramp for entrance;Assistance with cooking/housework        Equipment Recommendations Rolling walker (2 wheels)  Recommendations for Other Services       Functional Status Assessment Patient has had a recent decline in their functional status and demonstrates the ability to make significant improvements in function in a reasonable and predictable amount of time.     Precautions / Restrictions Precautions Precautions: Fall Restrictions Weight Bearing Restrictions: Yes RLE Weight Bearing: Weight bearing as tolerated      Mobility  Bed Mobility Overal bed mobility: Needs Assistance Bed Mobility: Supine to Sit     Supine to sit: Min guard     General bed mobility comments: increased time, labored movement     Transfers Overall transfer level: Needs assistance   Transfers: Sit to/from Stand Sit to Stand: Min assist           General transfer comment: increased time, labored movement    Ambulation/Gait Ambulation/Gait assistance: Min assist Gait Distance (Feet): 3 Feet Assistive device: Rolling walker (2 wheels) Gait Pattern/deviations: Decreased step length - left, Decreased stride length, Decreased stance time - right, Antalgic Gait velocity: slow     General Gait Details: limited to a few steps forward/backwards at bedside before having to sit due to c/o nausea and dizziness  Stairs            Wheelchair Mobility     Tilt Bed    Modified Rankin (Stroke Patients Only)       Balance Overall balance assessment: Needs assistance Sitting-balance support: Feet supported, No upper extremity supported Sitting balance-Leahy Scale: Fair Sitting balance - Comments: fair/good seated at EOB   Standing balance support: During functional activity, Bilateral upper extremity supported Standing balance-Leahy Scale: Fair Standing balance comment: using RW                             Pertinent Vitals/Pain Pain Assessment Pain Assessment: Faces Faces Pain Scale: Hurts a little bit Pain Location: right knee Pain Descriptors / Indicators: Sore Pain Intervention(s): Limited activity within patient's tolerance, Monitored during session, Repositioned    Home Living Family/patient expects to be discharged to:: Private residence Living Arrangements: Spouse/significant other Available Help at Discharge: Family;Available 24 hours/day Type of Home: Mobile home Home Access: Stairs  to enter Entrance Stairs-Rails: Right;Left;Can reach both Entrance Stairs-Number of Steps: 4   Home Layout: One level Home Equipment: BSC/3in1;Grab bars - tub/shower      Prior Function Prior Level of Function : Independent/Modified Independent;Driving             Mobility  Comments: Designer, jewellery AD, drives, shops ADLs Comments: Independent     Hand Dominance   Dominant Hand: Right    Extremity/Trunk Assessment   Upper Extremity Assessment Upper Extremity Assessment: Overall WFL for tasks assessed    Lower Extremity Assessment Lower Extremity Assessment: Generalized weakness;RLE deficits/detail RLE Deficits / Details: grossly -4/5 RLE Sensation: WNL RLE Coordination: WNL    Cervical / Trunk Assessment Cervical / Trunk Assessment: Normal  Communication   Communication: No difficulties  Cognition Arousal/Alertness: Awake/alert Behavior During Therapy: WFL for tasks assessed/performed Overall Cognitive Status: Within Functional Limits for tasks assessed                                          General Comments      Exercises Total Joint Exercises Ankle Circles/Pumps: Supine, 10 reps, Strengthening, Right, AROM Quad Sets: AROM, Strengthening, Right, 10 reps, Supine Short Arc Quad: AROM, Strengthening, Right, 10 reps, Supine Heel Slides: AROM, Strengthening, Right, 10 reps, Supine Goniometric ROM: Right knee ROM: 0 - 88 degrees   Assessment/Plan    PT Assessment Patient needs continued PT services  PT Problem List Decreased strength;Decreased range of motion;Decreased activity tolerance;Decreased balance;Decreased mobility       PT Treatment Interventions DME instruction;Gait training;Stair training;Functional mobility training;Therapeutic activities;Therapeutic exercise;Patient/family education;Balance training    PT Goals (Current goals can be found in the Care Plan section)  Acute Rehab PT Goals Patient Stated Goal: return home with family to assist PT Goal Formulation: With patient/family Time For Goal Achievement: 09/13/22 Potential to Achieve Goals: Good    Frequency BID     Co-evaluation               AM-PAC PT "6 Clicks" Mobility  Outcome Measure Help needed turning from your back  to your side while in a flat bed without using bedrails?: None Help needed moving from lying on your back to sitting on the side of a flat bed without using bedrails?: A Little Help needed moving to and from a bed to a chair (including a wheelchair)?: A Little Help needed standing up from a chair using your arms (e.g., wheelchair or bedside chair)?: A Little Help needed to walk in hospital room?: A Lot Help needed climbing 3-5 steps with a railing? : A Lot 6 Click Score: 17    End of Session   Activity Tolerance: Patient tolerated treatment well;Other (comment) (limited due to c/o nausea, dizziness) Patient left: in bed;with call bell/phone within reach;with family/visitor present Nurse Communication: Mobility status PT Visit Diagnosis: Unsteadiness on feet (R26.81);Other abnormalities of gait and mobility (R26.89);Muscle weakness (generalized) (M62.81)    Time: 6578-4696 PT Time Calculation (min) (ACUTE ONLY): 25 min   Charges:   PT Evaluation $PT Eval Moderate Complexity: 1 Mod PT Treatments $Therapeutic Activity: 23-37 mins PT General Charges $$ ACUTE PT VISIT: 1 Visit         3:45 PM, 09/11/22 Ocie Bob, MPT Physical Therapist with [facility] [phone] office [phone] mobile phone

## 2022-09-11 NOTE — Anesthesia Postprocedure Evaluation (Signed)
Anesthesia Post Note  Patient: Erica Graves  Procedure(s) Performed: TOTAL KNEE ARTHROPLASTY (Right: Knee)  Patient location during evaluation: PACU Anesthesia Type: Spinal Level of consciousness: awake and alert and oriented Pain management: pain level controlled Vital Signs Assessment: post-procedure vital signs reviewed and stable Respiratory status: spontaneous breathing, nonlabored ventilation and respiratory function stable Cardiovascular status: blood pressure returned to baseline and stable Postop Assessment: no apparent nausea or vomiting Anesthetic complications: no  No notable events documented.   Last Vitals:  Vitals:   09/11/22 1130 09/11/22 1154  BP: (!) 140/82 (!) 138/97  Pulse: (!) 57 68  Resp: 11   Temp:  (!) 36.4 C  SpO2: 100% 100%    Last Pain:  Vitals:   09/11/22 1154  TempSrc: Oral  PainSc:                  Jock Mahon C Nakeisha Greenhouse

## 2022-09-11 NOTE — Interval H&P Note (Signed)
History and Physical Interval Note:  09/11/2022 7:19 AM  Erica Graves  has presented today for surgery, with the diagnosis of Right knee osteoarthritis.  The various methods of treatment have been discussed with the patient and family. After consideration of risks, benefits and other options for treatment, the patient has consented to  Procedure(s): TOTAL KNEE ARTHROPLASTY (Right) as a surgical intervention.  The patient's history has been reviewed, patient examined, no change in status, stable for surgery.  I have reviewed the patient's chart and labs.  Questions were answered to the patient's satisfaction.     Fuller Canada

## 2022-09-11 NOTE — Op Note (Signed)
Dictation for total knee replacement  09/11/2022  10:24 AM  Orthopaedic Surgery Operative Note (CSN: [id number])  Erica Graves  Aug 18, 1965 Date of Surgery: 09/11/2022   Diagnoses:  Right knee osteoarthritis- primary   Procedure: Right Total knee arthroplasty   Operative Finding Severe OA grade 4 MFC with attenuation of the medial meniscus and  Grade 3 PATELLA wear and  Normal lateral compartment   Acl and pcl normal    Post-Op Diagnosis: Same Surgeons:Primary: Vickki Hearing, MD Assistants: Aram Beecham and angela Location: [facility] OR ROOM 4 Anesthesia: spinal plus knee block plus zinrelef  Antibiotics: Ancef 2 g Tourniquet time:  Total Tourniquet Time Documented: Thigh (Right) - 74 minutes Total: Thigh (Right) - 74 minutes  Estimated Blood Loss: none Complications: None Specimens: None   Implants: Implant Name Type Inv. Item Serial No. Manufacturer Lot No. LRB No. Used Action  CEMENT HV SMART SET - [id number] Cement CEMENT HV SMART SET  [employer]PU[employer] [id number] Right 2 Implanted  INSERT TIB ATTUNE FB SZ5X6 - [id number] Insert INSERT TIB ATTUNE FB SZ5X6  DEPUY ORTHOPA[employer] [id number] Right 1 Implanted  PATELLA MEDIAL ATTUN KNEE - [id number] Knees PATELLA MEDIAL ATTUN KNEE  DEPUY ORTHOPAEDICS [id number] Right 1 Implanted  ATTUNE PSFEM RTSZ5 NARCEM KNEE - [id number] Femur ATTUNE PSFEM RTSZ5 NARCEM KNEE  DEPUY ORTHOPAED[employer] [id number] Right 1 Implanted  BSPLAT TIB 4 CMNT FX BRNG STRL - [id number] Stem BSPLAT TIB 4 CMNT FX [employer]mond D[employer] [id number] Right 1 Implanted     Indications for Surgery:   Erica Graves is a 57 y.o. female who presented with disabling knee pain after failing a trial of nonoperative care.  Benefits and risks of operative and nonoperative management were discussed prior to surgery with patient/guardian(s) and informed consent form was completed.  While all risks cannot be anticipated, specific risks including infection, need for  additional surgery, stiffness, postop pain, infection, implant removal, loosening, infection requiring amputation, deep vein thrombosis, pulmonary embolus were discussed.   Procedure:   The patient was identified properly. Informed consent was obtained and the surgical site was marked. The patient was taken to the OR where general anesthesia was induced.  The patient was positioned supine on the operating table.  The right knee was prepped and draped in the usual sterile fashion.  Timeout was performed before the beginning of the case.  Tourniquet was used for the above duration.   Details of surgery: The patient was identified by 2 approved identification mechanisms. The operative extremity was evaluated and found to be acceptable for surgical treatment today. The chart was reviewed. The surgical site was confirmed and marked. The patient had a saphenous nerve block preoperatively  The patient was taken to the operating room and given appropriate antibiotic 2 g of Ancef. This is consistent with the SCIP protocol.  The patient was given the following anesthetic: Spinal anesthetic plus saphenous nerve block plus ZINRELEF IntraOp  The patient was then placed supine on the operating table. A Foley catheter was inserted. The operative extremity was prepped and draped sterilely from the toes to the groin.  Timeout was executed confirming the patient's name, surgical site, antibiotic administration, x-rays available, and implants available.  The operative limb,  was exsanguinated with a six-inch Esmarch and the tourniquet was inflated to 300 mmHg.  A straight midline incision was made over the right KNEE and taken down to the extensor mechanism. A medial arthrotomy was performed. The patella was everted and the patellofemoral soft  tissue was released, along with the patellar fat pad.  The anterior cruciate ligament and PCL were resected.  The anterior horns of the lateral and medial meniscus were  resected. The medial soft tissue sleeve was elevated to the mid coronal plane.  A three-eighths inch drill bit was used to enter the femoral canal which was decompressed with suction and irrigation until clear.   The distal femoral cutting guide was set for 9 mm distal resection,  5valgus alignment, for a right knee. The distal femur was resected and checked for flatness.  The attune sizing femoral guide was placed and the femur was preliminarily sized to a size 5.   The external alignment guide for the tibial resection was then applied to the distal and proximal tibia and set for anatomic slope along with 9 MM resection  from the high lateral side.   Rotational alignment was set using the malleolus, the tibial tubercle and the tibial spines.  The proximal tibia was resected along with  residual menisci. The tibia was sized using a base plate to a size 4.   The extension gap was checked.  6 block balanced the extension gap   A 4-in-1 cutting block was placed along with collateral ligament retractors.  Our target was proper rotation based on the epicondylar axis using Whitesides line as a guide as well.  Once the block was pinned in place we took the spacer block that balanced extension gap and placed it on top of the tibia under the femoral block.  This fit nicely after moving it down 1.5 mm  The ankle wing was used to confirm that notching would not occur  Once I was satisfied with the spacer block collateral ligament retractors were placed and I completed the 4 distal femoral cuts   The extension gap was rechecked with the same spacer block which was a size 6   The correct sized notch cutting guide for the femur was then applied and the notch cut was made.  Trial reduction was completed using size 5 femur 4 tibia 6 poly trial implants. Patella tracking was normal  We then skeletonized the patella. It measured 23 mm in thickness and the patellar resection was set for resection of  9.5 millimeters. the patellar resection was completed. The patella diameter measured 35. We then drilled the peg holes for the patella.  Completed patellar thickness was 23.5  The proximal tibia was prepared using the size 4 base plate.  Thorough irrigation was performed using saline  and the bone was dried and prepared for cement. The cement was mixed on the back table using third generation preparation techniques  The implants were then cemented in place and excess cement was removed. The cement was allowed to cure.  Saline irrigate Chane was followed by Surgipor irrigation   Any excess bone fragments and cement was removed.  The extensor mechanism was closed with #1 Bralon suture followed by subcutaneous tissue closure using 0 Monocryl suture in 2 layers  Zynrelef a total of 2 vials were injected prior to complete extensor mechanism closure.  The openings and the capsule were then closed with #1 Braylon   Skin approximation was performed using staples  A sterile dressing was applied, TED hose were placed on the operative extremity followed by Cryo/Cuff.  The patient was taken recovery room in stable condition  Postop plan: Weightbearing as tolerated CPM machine Immediate physical therapy Discharge tomorrow if stable

## 2022-09-11 NOTE — Interval H&P Note (Signed)
History and Physical Interval Note:  09/11/2022 7:20 AM  Erica Graves  has presented today for surgery, with the diagnosis of Right knee osteoarthritis.  The various methods of treatment have been discussed with the patient and family. After consideration of risks, benefits and other options for treatment, the patient has consented to  Procedure(s): TOTAL KNEE ARTHROPLASTY (Right) as a surgical intervention.  The patient's history has been reviewed, patient examined, no change in status, stable for surgery.  I have reviewed the patient's chart and labs.  Questions were answered to the patient's satisfaction.     Fuller Canada

## 2022-09-11 NOTE — Plan of Care (Signed)
  Problem: Acute Rehab PT Goals(only PT should resolve) Goal: Pt Will Go Supine/Side To Sit Outcome: Progressing Flowsheets (Taken 09/11/2022 1546) Pt will go Supine/Side to Sit: with modified independence Goal: Patient Will Transfer Sit To/From Stand Outcome: Progressing Flowsheets (Taken 09/11/2022 1546) Patient will transfer sit to/from stand: with modified independence Goal: Pt Will Transfer Bed To Chair/Chair To Bed Outcome: Progressing Flowsheets (Taken 09/11/2022 1546) Pt will Transfer Bed to Chair/Chair to Bed: with modified independence Goal: Pt Will Ambulate Outcome: Progressing Flowsheets (Taken 09/11/2022 1546) Pt will Ambulate:  100 feet  with modified independence  with rolling walker   3:47 PM, 09/11/22 Erica Graves, MPT Physical Therapist with [facility] [phone] office [phone] mobile phone

## 2022-09-11 NOTE — Anesthesia Procedure Notes (Addendum)
Anesthesia Regional Block: Adductor canal block   Pre-Anesthetic Checklist: , timeout performed,  Correct Patient, Correct Site, Correct Laterality,  Correct Procedure, Correct Position, site marked,  Risks and benefits discussed,  Surgical consent,  Pre-op evaluation,  At surgeon's request and post-op pain management  Laterality: Right  Prep: chloraprep       Needles:  Injection technique: Single-shot  Needle Type: Echogenic Stimulator Needle     Needle Length: 10cm  Needle Gauge: 20   Needle insertion depth: 8 cm   Additional Needles:   Procedures:,,,, ultrasound used (permanent image in chart),,    Narrative:  Start time: 09/11/2022 7:21 AM End time: 09/11/2022 7:29 AM Injection made incrementally with aspirations every 5 mL.  Performed by: Personally  Anesthesiologist: Molli Barrows, MD  Additional Notes: BP cuff, EKG monitors applied. Sedation begun. After nerve location anesthetic injected incrementally, slowly , and after neg aspirations. Tolerated well.

## 2022-09-11 NOTE — Anesthesia Preprocedure Evaluation (Signed)
Anesthesia Evaluation  Patient identified by MRN, date of birth, ID band Patient awake    Reviewed: Allergy & Precautions, H&P , NPO status , Patient's Chart, lab work & pertinent test results  Airway Mallampati: II  TM Distance: >3 FB Neck ROM: Full    Dental  (+) Dental Advisory Given, Missing   Pulmonary neg pulmonary ROS   Pulmonary exam normal breath sounds clear to auscultation       Cardiovascular hypertension, Pt. on medications Normal cardiovascular exam Rhythm:Regular Rate:Normal     Neuro/Psych  Headaches PSYCHIATRIC DISORDERS Anxiety Depression     Neuromuscular disease    GI/Hepatic Neg liver ROS,GERD  Medicated,,  Endo/Other  negative endocrine ROS    Renal/GU negative Renal ROS  negative genitourinary   Musculoskeletal  (+) Arthritis , Osteoarthritis,    Abdominal   Peds negative pediatric ROS (+)  Hematology negative hematology ROS (+)   Anesthesia Other Findings   Reproductive/Obstetrics negative OB ROS                             Anesthesia Physical Anesthesia Plan  ASA: 2  Anesthesia Plan: Spinal   Post-op Pain Management: Regional block* and Dilaudid IV   Induction: Intravenous  PONV Risk Score and Plan: 2 and Ondansetron and Dexamethasone  Airway Management Planned: Nasal Cannula and Natural Airway  Additional Equipment:   Intra-op Plan:   Post-operative Plan:   Informed Consent: I have reviewed the patients History and Physical, chart, labs and discussed the procedure including the risks, benefits and alternatives for the proposed anesthesia with the patient or authorized representative who has indicated his/her understanding and acceptance.     Dental advisory given  Plan Discussed with: CRNA and Surgeon  Anesthesia Plan Comments: (Possible GA with airway was explained )        Anesthesia Quick Evaluation

## 2022-09-11 NOTE — Transfer of Care (Signed)
Immediate Anesthesia Transfer of Care Note  Patient: Erica Graves  Procedure(s) Performed: TOTAL KNEE ARTHROPLASTY (Right: Knee)  Patient Location: PACU  Anesthesia Type:Spinal  Level of Consciousness: sedated  Airway & Oxygen Therapy: Patient Spontanous Breathing  Post-op Assessment: Report given to RN, Post -op Vital signs reviewed and stable, and Patient moving all extremities X 4  Post vital signs: Reviewed and stable  Last Vitals:  Vitals Value Taken Time  BP 102/61 09/11/22 1007  Temp 97.5   Pulse 73 09/11/22 1010  Resp 29 09/11/22 1010  SpO2 100 % 09/11/22 1010  Vitals shown include unfiled device data.  Last Pain:  Vitals:   09/11/22 0653  TempSrc: Oral  PainSc: 8          Complications: No notable events documented.

## 2022-09-12 ENCOUNTER — Other Ambulatory Visit: Payer: Self-pay | Admitting: Orthopedic Surgery

## 2022-09-12 ENCOUNTER — Telehealth: Payer: Self-pay | Admitting: Radiology

## 2022-09-12 DIAGNOSIS — Z9889 Other specified postprocedural states: Secondary | ICD-10-CM

## 2022-09-12 DIAGNOSIS — M1711 Unilateral primary osteoarthritis, right knee: Secondary | ICD-10-CM | POA: Diagnosis not present

## 2022-09-12 LAB — CBC
HCT: 35.7 % — ABNORMAL LOW (ref 36.0–46.0)
Hemoglobin: 11.8 g/dL — ABNORMAL LOW (ref 12.0–15.0)
MCH: 29.3 pg (ref 26.0–34.0)
MCHC: 33.1 g/dL (ref 30.0–36.0)
MCV: 88.6 fL (ref 80.0–100.0)
Platelets: 190 10*3/uL (ref 150–400)
RBC: 4.03 MIL/uL (ref 3.87–5.11)
RDW: 12.2 % (ref 11.5–15.5)
WBC: 14.6 10*3/uL — ABNORMAL HIGH (ref 4.0–10.5)
nRBC: 0 % (ref 0.0–0.2)

## 2022-09-12 LAB — BASIC METABOLIC PANEL
Anion gap: 6 (ref 5–15)
BUN: 18 mg/dL (ref 6–20)
CO2: 24 mmol/L (ref 22–32)
Calcium: 8.9 mg/dL (ref 8.9–10.3)
Chloride: 106 mmol/L (ref 98–111)
Creatinine, Ser: 0.74 mg/dL (ref 0.44–1.00)
GFR, Estimated: >60 mL/min (ref >60)
Glucose, Bld: 147 mg/dL — ABNORMAL HIGH (ref 70–99)
Potassium: 4.2 mmol/L (ref 3.5–5.1)
Sodium: 136 mmol/L (ref 135–145)

## 2022-09-12 MED ORDER — ONDANSETRON HCL 4 MG PO TABS: 4.0000 mg | ORAL_TABLET | ORAL | 2 refills | Status: DC | PRN

## 2022-09-12 MED ORDER — METHOCARBAMOL 1000 MG/10ML IJ SOLN
INTRAMUSCULAR | Status: AC
  Filled 2022-09-12: qty 10

## 2022-09-12 MED ORDER — OXYCODONE HCL 5 MG PO TABS: 5.0000 mg | ORAL_TABLET | ORAL | 0 refills | Status: AC

## 2022-09-12 MED ORDER — TRAMADOL HCL 50 MG PO TABS: 50.0000 mg | ORAL_TABLET | Freq: Four times a day (QID) | ORAL | 0 refills | Status: AC

## 2022-09-12 MED ORDER — METHOCARBAMOL 500 MG PO TABS: 500.0000 mg | ORAL_TABLET | Freq: Four times a day (QID) | ORAL | 2 refills | Status: DC | PRN

## 2022-09-12 MED ORDER — ASPIRIN 81 MG PO CHEW: 81.0000 mg | CHEWABLE_TABLET | Freq: Two times a day (BID) | ORAL | 0 refills | Status: DC

## 2022-09-12 MED ORDER — PROMETHAZINE HCL 12.5 MG PO TABS: 12.5000 mg | ORAL_TABLET | Freq: Four times a day (QID) | ORAL | 0 refills | Status: DC | PRN

## 2022-09-12 MED ORDER — POLYETHYLENE GLYCOL 3350 17 G PO PACK: 17.0000 g | PACK | Freq: Every day | ORAL | 0 refills | Status: DC | PRN

## 2022-09-12 NOTE — Progress Notes (Signed)
Discharge instrutctions given with no further questions.

## 2022-09-12 NOTE — Progress Notes (Signed)
Pt has reported no pain during the night while receiving scheduled pain meds. Pt has c/o nausea after PO oxycodone. PRN Zofran and Reglan given. Foley removed this a.m. per order.

## 2022-09-12 NOTE — Progress Notes (Signed)
Dr.Harrison notified earlier that pt.wanted phenergan changed to zofran for discharge medications.  Awaiting changes to be completed. (In progress)

## 2022-09-12 NOTE — Plan of Care (Signed)

## 2022-09-12 NOTE — TOC Transition Note (Signed)
Transition of Care [facility]) - CM/SW Discharge Note   Patient Details  Name: Erica Graves MRN: [id number] Date of Birth: [birth year]  [facility] CM/SW Contact:  Villa Herb, LCSWA Phone Number: 09/12/2022, 10:38 AM   Clinical Narrative:    CSW updated that pt will need HH PT and a walker for D/C. CSW spoke with pt who is agreeable to this and does not have agency preference. CSW spoke with Amy at Dr. Mort Sawyers office who states that referral for [facility] PT was given to [facility]. CSW made referral for RW to [facility]. Walker delivered to room for pt. TOC signing off.    Final next level of care: Home w Home Health Services Barriers to Discharge: Barriers Resolved   Patient Goals and CMS Choice      Discharge Placement                         Discharge Plan and Services Additional resources added to the After Visit Summary for                  DME Arranged: Walker rolling DME Agency: [facility] Date DME Agency Contacted: 09/12/22   Representative spoke with at DME Agency: Marthann Schiller HH Arranged: PT HH Agency: [facility] Date [facility] Agency Contacted: 09/12/22      Social Determinants of Health (SDOH) Interventions SDOH Screenings   Food Insecurity: No Food Insecurity (09/11/2022)  Housing: Low Risk  (09/11/2022)  Transportation Needs: No Transportation Needs (09/11/2022)  Utilities: Not At Risk (09/11/2022)  Alcohol Screen: Low Risk  (06/05/2019)  Depression (PHQ2-9): Low Risk  (07/06/2022)  Financial Resource Strain: Low Risk  (06/05/2019)  Physical Activity: Inactive (06/05/2019)  Social Connections: Socially Integrated (06/05/2019)  Stress: Stress Concern Present (06/05/2019)  Tobacco Use: Low Risk  (09/11/2022)     Readmission Risk Interventions     No data to display

## 2022-09-12 NOTE — Discharge Summary (Signed)
Physician Discharge Summary  Patient ID: Erica Graves MRN: [id number] DOB/AGE: 03-21-1965 57 y.o.  Admit date: 09/11/2022 Discharge date: 09/12/2022  Admission Diagnoses: osteoarthritis right knee   Discharge Diagnoses: same   Discharged Condition: good  Procedure: right total knee arthroplasty    Hospital Course:  Implant Name Type Inv. Item Serial No. Manufacturer Lot No. LRB No. Used Action  CEMENT HV SMART SET - [id number] Cement CEMENT HV SMART SET  [employer] [id number] Right 2 Implanted  INSERT TIB ATTUNE FB SZ5X6 - [id number] Insert INSERT TIB ATTUNE FB [id number]  [employer]PU[employer] [id number] Right 1 Implanted  PATELLA MEDIAL ATTUN KNEE - [id number] Knees PATELLA MEDIAL ATTUN KNEE  DEPUY ORTH[employer] [id number] Right 1 Implanted  ATTUNE PSFEM RTSZ5 NARCEM KNEE - [id number] Femur ATTUNE PSFEM RTSZ5 NARCEM KNEE  [employer] [id number] Right 1 Implanted  BSPLAT TIB 4 CMNT FX BRNG STRL - [id number] Stem BSPLAT TIB 4 CMNT FX BRNG STRL  [employer] [id number] Right 1 Implanted    Discharge Exam: BP 134/61 (BP Location: Right Arm)   Pulse 62   Temp 98.2 F (36.8 C) (Oral)   Resp 16   Ht 5\' 5"  (1.651 m)   Wt 89.8 kg   SpO2 97%   BMI 32.94 kg/m  Physical Exam Constitutional:      Appearance: Normal appearance.  HENT:     Head: Normocephalic and atraumatic.  Eyes:     General: No scleral icterus.       Right eye: No discharge.        Left eye: No discharge.     Extraocular Movements: Extraocular movements intact.     Pupils: Pupils are equal, round, and reactive to light.  Cardiovascular:     Rate and Rhythm: Normal rate.     Pulses: Normal pulses.  Musculoskeletal:        General: No swelling or deformity.     Right lower leg: No edema.     Left lower leg: No edema.  Skin:    Capillary Refill: Capillary refill takes less than 2 seconds.  Neurological:     General: No focal deficit present.     Mental Status: She is alert and oriented to  person, place, and time.  Psychiatric:        Mood and Affect: Mood normal.       Disposition: Discharge disposition: 01-Home or Self Care       Discharge Instructions     Call MD / Call 911   Complete by: As directed    If you experience chest pain or shortness of breath, CALL 911 and be transported to the hospital emergency room.  If you develope a fever above 101 F, pus (white drainage) or increased drainage or redness at the wound, or calf pain, call your surgeon's office.   Constipation Prevention   Complete by: As directed    Drink plenty of fluids.  Prune juice may be helpful.  You may use a stool softener, such as Colace (over the counter) 100 mg twice a day.  Use MiraLax (over the counter) for constipation as needed.   Diet - low sodium heart healthy   Complete by: As directed    Discharge instructions   Complete by: As directed    Wound: keep dressing on until Doctor visit   BLUE FOAM: 1 a day for 20 minutes  ICE CUFF  30 min 6 x a day   Exercises as taught   CPM 0-80,  increase 10 x a day until max motion reached as tolerated   White stockings 4  weeks   Increase activity slowly as tolerated   Complete by: As directed    Post-operative opioid taper instructions:   Complete by: As directed    POST-OPERATIVE OPIOID TAPER INSTRUCTIONS: It is important to wean off of your opioid medication as soon as possible. If you do not need pain medication after your surgery it is ok to stop day one. Opioids include: Codeine, Hydrocodone(Norco, Vicodin), Oxycodone(Percocet, oxycontin) and hydromorphone amongst others.  Long term and even short term use of opiods can cause: Increased pain response Dependence Constipation Depression Respiratory depression And more.  Withdrawal symptoms can include Flu like symptoms Nausea, vomiting And more Techniques to manage these symptoms Hydrate well Eat regular healthy meals Stay active Use relaxation techniques(deep  breathing, meditating, yoga) Do Not substitute Alcohol to help with tapering If you have been on opioids for less than two weeks and do not have pain than it is ok to stop all together.  Plan to wean off of opioids This plan should start within one week post op of your joint replacement. Maintain the same interval or time between taking each dose and first decrease the dose.  Cut the total daily intake of opioids by one tablet each day Next start to increase the time between doses. The last dose that should be eliminated is the evening dose.         Allergies as of 09/12/2022       Reactions   Norco [hydrocodone-acetaminophen] Nausea And Vomiting        Medication List     STOP taking these medications    acetaminophen 500 MG tablet Commonly known as: TYLENOL   clonazePAM 0.5 MG tablet Commonly known as: KLONOPIN   escitalopram 10 MG tablet Commonly known as: Lexapro   indapamide 1.25 MG tablet Commonly known as: LOZOL       TAKE these medications    amLODipine 5 MG tablet Commonly known as: NORVASC Take 1 tablet (5 mg total) by mouth daily.   APPLE CIDER VINEGAR PO Take 1 capsule by mouth daily.   aspirin 81 MG chewable tablet Chew 1 tablet (81 mg total) by mouth 2 (two) times daily.   cholecalciferol 25 MCG (1000 UNIT) tablet Commonly known as: VITAMIN D3 Take 1,000 Units by mouth daily.   methocarbamol 500 MG tablet Commonly known as: ROBAXIN Take 1 tablet (500 mg total) by mouth every 6 (six) hours as needed for muscle spasms.   multivitamin with minerals Tabs tablet Take 1 tablet by mouth daily.   naproxen 500 MG tablet Commonly known as: NAPROSYN Take 1 tablet (500 mg total) by mouth 2 (two) times daily with a meal.   oxyCODONE 5 MG immediate release tablet Commonly known as: Oxy IR/ROXICODONE Take 1 tablet (5 mg total) by mouth every 4 (four) hours for 7 days. Acute post op pain   pantoprazole 40 MG tablet Commonly known as:  PROTONIX Take 40 mg by mouth daily.   polyethylene glycol 17 g packet Commonly known as: MIRALAX / GLYCOLAX Take 17 g by mouth daily as needed for mild constipation.   promethazine 12.5 MG tablet Commonly known as: PHENERGAN Take 1 tablet (12.5 mg total) by mouth every 6 (six) hours as needed for nausea or vomiting.   traMADol 50 MG tablet Commonly known as: ULTRAM Take 1 tablet (50 mg total) by mouth every 6 (six) hours for 7 days.  Acute post op pain        Follow-up Information     Vickki Hearing, MD Follow up on 09/27/2022.   Specialties: Orthopedic Surgery, Radiology Why: For suture removal, For wound re-check Contact information: [address] [place] [address] [phone]                 Signed: Fuller Canada 09/12/2022, 9:55 AM

## 2022-09-12 NOTE — Progress Notes (Signed)
Subjective: 1 Day Post-Op Procedure(s) (LRB): TOTAL KNEE ARTHROPLASTY (Right)  Pain controlled but has a little nausea  Objective: Vital signs in last 24 hours: Temp:  [97.4 F (36.3 C)-98.2 F (36.8 C)] 98.2 F (36.8 C) (07/17 0529) Pulse Rate:  [57-84] 62 (07/17 0529) Resp:  [11-25] 16 (07/17 0529) BP: (102-148)/(54-97) 134/61 (07/17 0529) SpO2:  [97 %-100 %] 97 % (07/17 0529)  Dressing small amt of drainage   Calf soft non tender; no swelling   Intake/Output from previous day: 07/16 0701 - 07/17 0700 In: 2563.9 [P.O.:480; I.V.:1718.9; IV Piggyback:365] Out: 3505 [Urine:3500; Blood:5] Intake/Output this shift: No intake/output data recorded.  Recent Labs    09/12/22 0452  HGB 11.8*   Recent Labs    09/12/22 0452  WBC 14.6*  RBC 4.03  HCT 35.7*  PLT 190   Recent Labs    09/12/22 0452  NA 136  K 4.2  CL 106  CO2 24  BUN 18  CREATININE 0.74  GLUCOSE 147*  CALCIUM 8.9   No results for input(s): "LABPT", "INR" in the last 72 hours.  Assessment/Plan: 1 Day Post-Op Procedure(s) (LRB): TOTAL KNEE ARTHROPLASTY (Right)   D/c home    Fuller Canada 09/12/2022, 7:29 AM

## 2022-09-12 NOTE — Telephone Encounter (Signed)
Have completed a prior authorization for the Oxycodone on Cover my meds

## 2022-09-12 NOTE — Progress Notes (Signed)
Requested Prescriptions   Pending Prescriptions Disp Refills   ondansetron (ZOFRAN) 4 MG tablet 30 tablet 2    Sig: Take 1 tablet (4 mg total) by mouth every 4 (four) hours as needed for nausea or vomiting.

## 2022-09-12 NOTE — Progress Notes (Addendum)
Physical Therapy Treatment Patient Details Name: Erica Graves MRN: [id number] DOB: 04/24/1965 Today's Date: 09/12/2022  RIGHT KNEE ROM:  0 - 92 degrees AMBULATION DISTANCE: 150 feet using RW, mod independent, stairs   History of Present Illness Erica Graves is a 57 y/o female, s/p Right TKA on 09/11/22, with the diagnosis of Right knee osteoarthritis.    PT Comments  Pt tolerated transfers, ambulation, and stairs well today with use of RW. Ambulated 150 feet with no loss of balance and successfully performed stairs with use of rails. Pt tolerated sitting upright in chair after therapy. Patient discharged to care of nursing for ambulation daily as tolerated for length of stay.      Assistance Recommended at Discharge Set up Supervision/Assistance  If plan is discharge home, recommend the following:  Can travel by private vehicle    A little help with walking and/or transfers;A little help with bathing/dressing/bathroom;Assistance with cooking/housework;Help with stairs or ramp for entrance      Equipment Recommendations  Rolling walker (2 wheels)    Recommendations for Other Services       Precautions / Restrictions Precautions Precautions: Fall Restrictions Weight Bearing Restrictions: Yes RLE Weight Bearing: Weight bearing as tolerated     Mobility  Bed Mobility Overal bed mobility: Independent Bed Mobility: Supine to Sit     Supine to sit: Independent     General bed mobility comments: increased time    Transfers Overall transfer level: Independent Equipment used: Rolling walker (2 wheels) Transfers: Sit to/from Stand Sit to Stand: Independent           General transfer comment: increased time    Ambulation/Gait Ambulation/Gait assistance: Modified independent (Device/Increase time) Gait Distance (Feet): 150 Feet Assistive device: Rolling walker (2 wheels) Gait Pattern/deviations: Decreased step length - left, Decreased stride length,  Antalgic Gait velocity: decreased     General Gait Details: decrased step length, sufficient cadence   Stairs Stairs: Yes Stairs assistance: Modified independent (Device/Increase time) Stair Management: Two rails, Step to pattern Number of Stairs: 8 General stair comments: 4 stairs ascended 4 stairs descended, good return   Wheelchair Mobility     Tilt Bed    Modified Rankin (Stroke Patients Only)       Balance Overall balance assessment: Independent Sitting-balance support: Feet supported, No upper extremity supported Sitting balance-Leahy Scale: Good Sitting balance - Comments: good seated at EOB   Standing balance support: During functional activity, Bilateral upper extremity supported Standing balance-Leahy Scale: Good Standing balance comment: using RW                            Cognition Arousal/Alertness: Awake/alert Behavior During Therapy: WFL for tasks assessed/performed Overall Cognitive Status: Within Functional Limits for tasks assessed                                          Exercises Total Joint Exercises Ankle Circles/Pumps: Supine, 10 reps, Strengthening, Right, AROM Quad Sets: AROM, Strengthening, Right, 10 reps, Supine Short Arc Quad: AROM, Strengthening, Right, 10 reps, Supine Heel Slides: AROM, Strengthening, Right, 10 reps, Supine Goniometric ROM: 0-92 degrees    General Comments        Pertinent Vitals/Pain Pain Assessment Pain Score: 0-No pain    Home Living  Prior Function            PT Goals (current goals can now be found in the care plan section) Acute Rehab PT Goals Patient Stated Goal: return home with family to assist PT Goal Formulation: With patient/family Time For Goal Achievement: 09/13/22 Potential to Achieve Goals: Good Progress towards PT goals: Progressing toward goals    Frequency    BID      PT Plan Current plan remains appropriate     Co-evaluation              AM-PAC PT "6 Clicks" Mobility   Outcome Measure  Help needed turning from your back to your side while in a flat bed without using bedrails?: None Help needed moving from lying on your back to sitting on the side of a flat bed without using bedrails?: None Help needed moving to and from a bed to a chair (including a wheelchair)?: None Help needed standing up from a chair using your arms (e.g., wheelchair or bedside chair)?: A Little Help needed to walk in hospital room?: A Little Help needed climbing 3-5 steps with a railing? : A Little 6 Click Score: 21    End of Session   Activity Tolerance: Patient tolerated treatment well;No increased pain Patient left: in chair;with call bell/phone within reach Nurse Communication: Mobility status PT Visit Diagnosis: Unsteadiness on feet (R26.81);Other abnormalities of gait and mobility (R26.89);Muscle weakness (generalized) (M62.81)     Time: 0102-7253 PT Time Calculation (min) (ACUTE ONLY): 26 min  Charges:    $Gait Training: 8-22 mins $Therapeutic Exercise: 8-22 mins PT General Charges $$ ACUTE PT VISIT: 1 Visit                     Darilyn Storbeck SPT [place], DPT Program

## 2022-09-13 ENCOUNTER — Telehealth: Payer: Self-pay

## 2022-09-13 NOTE — Transitions of Care (Post Inpatient/ED Visit) (Signed)
09/13/2022  Name: Erica Graves MRN: [id number] DOB: 08-10-1965  Today's TOC FU Call Status: Today's TOC FU Call Status:: Successful TOC FU Call Competed TOC FU Call Complete Date: 09/13/22  Transition Care Management Follow-up Telephone Call Date of Discharge: 09/12/22 [name] Type of Discharge: Inpatient Admission Primary Inpatient Discharge Diagnosis:: total right knee replacement How have you been since you were released from the hospital?: Better Any questions or concerns?: No  Items Reviewed: Did you receive and understand the discharge instructions provided?: Yes Medications obtained,verified, and reconciled?: Yes (Medications Reviewed) Any new allergies since your discharge?: No Dietary orders reviewed?: Yes Do you have support at home?: Yes People in Home: spouse  Medications Reviewed Today: Medications Reviewed Today     Reviewed by Karena Addison, LPN (Licensed Practical Nurse) on 09/13/22 at 1048  Med List Status: <None>   Medication Order Taking? Sig Documenting Provider Last Dose Status Informant  amLODipine (NORVASC) 5 MG tablet [id number] No Take 1 tablet (5 mg total) by mouth daily. Campbell Riches, NP 09/10/2022 2200 Active Self  APPLE CIDER VINEGAR PO [id number] No Take 1 capsule by mouth daily. [provider] 09/10/2022 Active Self  aspirin 81 MG chewable tablet [id number]  Chew 1 tablet (81 mg total) by mouth 2 (two) times daily. Vickki Hearing, MD  Active   bupivacaine-meloxicam ER [facility]) injection 400 mg [id number]   Vickki Hearing, MD  Active   cholecalciferol (VITAMIN D3) 25 MCG (1000 UNIT) tablet [id number] No Take 1,000 Units by mouth daily. [provider] 09/10/2022 Active Self  methocarbamol (ROBAXIN) 500 MG tablet [id number]  Take 1 tablet (500 mg total) by mouth every 6 (six) hours as needed for muscle spasms. Vickki Hearing, MD  Active   Multiple Vitamin (MULTIVITAMIN WITH MINERALS)  TABS tablet [id number] No Take 1 tablet by mouth daily. [provider] 09/10/2022 Active Self  naproxen (NAPROSYN) 500 MG tablet [id number] No Take 1 tablet (500 mg total) by mouth 2 (two) times daily with a meal.  Patient not taking: Reported on 08/31/2022   Valentino Nose, NP Not Taking Active Self  ondansetron (ZOFRAN) 4 MG tablet [id number]  Take 1 tablet (4 mg total) by mouth every 4 (four) hours as needed for nausea or vomiting. Vickki Hearing, MD  Active   oxyCODONE (OXY IR/ROXICODONE) 5 MG immediate release tablet [id number]  Take 1 tablet (5 mg total) by mouth every 4 (four) hours for 7 days. Acute post op pain Vickki Hearing, MD  Active   pantoprazole (PROTONIX) 40 MG tablet [id number] No Take 40 mg by mouth daily. [provider] 09/11/2022 0500 Active Self  polyethylene glycol (MIRALAX / GLYCOLAX) 17 g packet [id number]  Take 17 g by mouth daily as needed for mild constipation. Vickki Hearing, MD  Active   traMADol Janean Sark) 50 MG tablet [id number]  Take 1 tablet (50 mg total) by mouth every 6 (six) hours for 7 days. Acute post op pain Vickki Hearing, MD  Active             Home Care and Equipment/Supplies: Were Home Health Services Ordered?: Yes Name of Home Health Agency:: [facility] Has Agency set up a time to come to your home?: No Any new equipment or medical supplies ordered?: Yes Name of Medical supply agency?: unknown Were you able to get the equipment/medical supplies?: Yes Do you have any questions related to the use of the equipment/supplies?: No  Functional Questionnaire: Do  you need assistance with bathing/showering or dressing?: Yes Do you need assistance with meal preparation?: Yes Do you need assistance with eating?: No Do you have difficulty maintaining continence: No Do you need assistance with getting out of bed/getting out of a chair/moving?: Yes Do you have difficulty managing or taking your medications?: No  Follow up  appointments reviewed: PCP Follow-up appointment confirmed?: NA Specialist Hospital Follow-up appointment confirmed?: Yes Date of Specialist follow-up appointment?: 09/27/22 Follow-Up Specialty Provider:: surgeon Do you need transportation to your follow-up appointment?: No Do you understand care options if your condition(s) worsen?: Yes-patient verbalized understanding    SIGNATURE Karena Addison, LPN [facility] Nurse Health Advisor Direct Dial [phone]

## 2022-09-14 ENCOUNTER — Telehealth: Payer: Self-pay | Admitting: Orthopedic Surgery

## 2022-09-14 NOTE — Telephone Encounter (Signed)
Dr. Mort Sawyers pt - pt lvm stating that she had surgery Tuesday and her bandages are coming off.  She wants to know what she needs to do. [phone]

## 2022-09-18 ENCOUNTER — Encounter (HOSPITAL_COMMUNITY): Payer: Self-pay | Admitting: Orthopedic Surgery

## 2022-09-18 LAB — TYPE AND SCREEN
ABO/RH(D): O POS
Antibody Screen: NEGATIVE
Unit division: 0
Unit division: 0

## 2022-09-18 LAB — BPAM RBC
Blood Product Expiration Date: 202408192359
Blood Product Expiration Date: 202408202359
Unit Type and Rh: 5100
Unit Type and Rh: 5100

## 2022-09-20 ENCOUNTER — Telehealth: Payer: Self-pay | Admitting: Orthopedic Surgery

## 2022-09-20 NOTE — Telephone Encounter (Signed)
Dr. Mort Sawyers pt - pt lvm stating she had surgery on 7/16 and she has yet to see an in home therapist. [phone]

## 2022-09-20 NOTE — Telephone Encounter (Signed)
No its not the medication// ask them to call in a med for uti

## 2022-09-20 NOTE — Telephone Encounter (Signed)
I contacted home health through [facility] they stated they would accept She is in [place]  Have no other options  She needs to start outpatient now. I called her to advise.  [phone] is the phone number to call, I have asked her to call there now and see if she can get in sooner.  Dr Romeo Apple she states she thinks she has a UTI and states her primary care told her it may be the medicines she is taking she wants to ask you about if meds she is on can be causing

## 2022-09-20 NOTE — Telephone Encounter (Signed)
She states she has heard back again from primary care and they want her to come in for the UTI I encouraged her to go there to be seen, she voiced understanding

## 2022-09-25 DIAGNOSIS — Z96651 Presence of right artificial knee joint: Secondary | ICD-10-CM | POA: Insufficient documentation

## 2022-09-27 ENCOUNTER — Other Ambulatory Visit: Payer: Self-pay

## 2022-09-27 ENCOUNTER — Encounter: Payer: Self-pay | Admitting: Orthopedic Surgery

## 2022-09-27 ENCOUNTER — Ambulatory Visit (INDEPENDENT_AMBULATORY_CARE_PROVIDER_SITE_OTHER): Payer: BC Managed Care – PPO | Admitting: Orthopedic Surgery

## 2022-09-27 ENCOUNTER — Ambulatory Visit (HOSPITAL_COMMUNITY): Payer: BC Managed Care – PPO | Attending: Family Medicine | Admitting: Physical Therapy

## 2022-09-27 DIAGNOSIS — Z96651 Presence of right artificial knee joint: Secondary | ICD-10-CM

## 2022-09-27 DIAGNOSIS — M1711 Unilateral primary osteoarthritis, right knee: Secondary | ICD-10-CM | POA: Insufficient documentation

## 2022-09-27 DIAGNOSIS — M25661 Stiffness of right knee, not elsewhere classified: Secondary | ICD-10-CM | POA: Insufficient documentation

## 2022-09-27 DIAGNOSIS — M25561 Pain in right knee: Secondary | ICD-10-CM | POA: Insufficient documentation

## 2022-09-27 DIAGNOSIS — M6281 Muscle weakness (generalized): Secondary | ICD-10-CM | POA: Diagnosis present

## 2022-09-27 NOTE — Therapy (Signed)
OUTPATIENT PHYSICAL THERAPY LOWER EXTREMITY EVALUATION   Patient Name: Erica Graves MRN: [id number] DOB:1965/09/18, 57 y.o., female Today's Date: 09/27/2022  END OF SESSION:  PT End of Session - 09/27/22 1150     Visit Number 1    Number of Visits 42    Date for PT Re-Evaluation 11/08/22    Authorization Type bc bs    Progress Note Due on Visit 10    PT Start Time 1115    PT Stop Time 1150   PT Time Calculation (min) 35 min             Past Medical History:  Diagnosis Date   Carpal tunnel syndrome, bilateral    Chronic low back pain    Constipation    Counseling for estrogen replacement therapy 07/07/2014   Depression with anxiety    Dyspareunia 07/07/2014   GAD (generalized anxiety disorder) 04/07/2020   History of kidney stones    Hot flashes 07/07/2014   Hypertension    Menopause 07/07/2014   Moody 07/07/2014   Obesity    Umbilical hernia    Past Surgical History:  Procedure Laterality Date   ABDOMINAL HYSTERECTOMY     BACK SURGERY     removal of cyst subdermal   CARPAL TUNNEL RELEASE Left 09/30/2014   Procedure: CARPAL TUNNEL RELEASE;  Surgeon: Vickki Hearing, MD;  Location: [facility];  Service: Orthopedics;  Laterality: Left;   CARPAL TUNNEL RELEASE Right 09/28/2015   Procedure: CARPAL TUNNEL RELEASE;  Surgeon: Vickki Hearing, MD;  Location: [facility];  Service: Orthopedics;  Laterality: Right;   COLONOSCOPY N/A 06/29/2013   Procedure: COLONOSCOPY;  Surgeon: West Bali, MD;  Location: [facility];  Service: Endoscopy;  Laterality: N/A;  1:30   ESOPHAGOGASTRODUODENOSCOPY (EGD) WITH PROPOFOL N/A 01/04/2022   Procedure: ESOPHAGOGASTRODUODENOSCOPY (EGD) WITH PROPOFOL;  Surgeon: Lanelle Bal, DO;  Location: [facility];  Service: Endoscopy;  Laterality: N/A;  12:30pm, asa 2   KNEE ARTHROSCOPY Right    KNEE ARTHROSCOPY  05/21/2011   Procedure: ARTHROSCOPY KNEE;  Surgeon: Vickki Hearing, MD;  Location: [facility];  Service: Orthopedics;  Laterality: Left;   lateral menisectomy   KNEE CLOSED REDUCTION Left 05/26/2012   Procedure: CLOSED MANIPULATION KNEE;  Surgeon: Vickki Hearing, MD;  Location: [facility];  Service: Orthopedics;  Laterality: Left;   left knee arthroscopy  08/14/2005 Dr. Romeo Apple torn meniscus   LUMBAR SPINE SURGERY     PARTIAL HYSTERECTOMY     TOTAL KNEE ARTHROPLASTY  03/31/2012   Procedure: TOTAL KNEE ARTHROPLASTY;  Surgeon: Vickki Hearing, MD;  Location: [facility];  Service: Orthopedics;  Laterality: Left;  Left Total Knee Arthroplasty   TOTAL KNEE ARTHROPLASTY Right 09/11/2022   Procedure: TOTAL KNEE ARTHROPLASTY;  Surgeon: Vickki Hearing, MD;  Location: [facility];  Service: Orthopedics;  Laterality: Right;   TRIGGER FINGER RELEASE Right 01/24/2017   Procedure: RELEASE TRIGGER FINGER/A-1 PULLEY right thumb;  Surgeon: Vickki Hearing, MD;  Location: [facility];  Service: Orthopedics;  Laterality: Right;   Patient Active Problem List   Diagnosis Date Noted   S/P total knee replacement, right 09/11/22 09/25/2022   Primary localized osteoarthritis of right knee 09/11/2022   Caregiver stress 07/06/2022   Abdominal pain, epigastric 12/11/2021   Chronic idiopathic constipation 11/10/2021   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   Arthritis of carpometacarpal (CMC) joint of right thumb 10/07/2020   Radial styloid tenosynovitis (de quervain) 10/07/2020   Irritable mood 04/07/2020   GAD (  generalized anxiety disorder) 04/07/2020   Vaginal itching 06/05/2019   Screening for colorectal cancer 06/05/2019   Screening for cholesterol level 06/05/2019   S/P trigger finger release 01/24/17 02/06/2017   Trigger finger of right thumb    Essential hypertension, benign 11/18/2015   Gastroesophageal reflux disease without esophagitis 05/06/2015   Hot flashes 07/07/2014   Dyspareunia 07/07/2014   Moody 07/07/2014   Menopause 07/07/2014   Counseling for estrogen replacement therapy 07/07/2014   Morbid obesity (HCC) 03/05/2014   Carpal tunnel  syndrome of right wrist 12/22/2013   Vitamin D deficiency 10/14/2013   Migraine headache without aura 11/24/2012   Muscle contraction headache 11/24/2012   CTS (carpal tunnel syndrome) 08/14/2012   Stiffness of joint, not elsewhere classified, lower leg 04/22/2012   Weakness of left leg 04/22/2012   Difficulty walking 04/22/2012   S/P total knee replacement, left 03/31/12 04/03/2012   Arthritis of knee, degenerative 03/05/2012   Osteoarthritis of left knee 12/12/2011   S/P arthroscopy of left knee 06/13/2011   Lateral meniscus tear 06/13/2011   Stiffness of knee joint 06/05/2011   Medial meniscus, posterior horn derangement 05/16/2011   DEGENERATIVE JOINT DISEASE, KNEES, BILATERAL 12/19/2009   PATELLO-FEMORAL SYNDROME 12/19/2009   FOOT PAIN, LEFT 07/22/2007   DEPRESSION/ANXIETY 12/27/2006   SHOULDER PAIN, LEFT 12/27/2006   OVERWEIGHT 11/12/2006   CONSTIPATION 11/12/2006   Lumbago 11/12/2006    PCP: Lilyan Punt   REFERRING PROVIDER: Vickki Hearing, MD  REFERRING DIAG: M17.11 (ICD-10-CM) - Unilateral primary osteoarthritis, right knee  THERAPY DIAG:  Right knee pain Right knee stiffness Muscle weakness Difficulty in walking.  Rationale for Evaluation and Treatment: Rehabilitation  ONSET DATE: 09/11/2022    SUBJECTIVE STATEMENT: PT had a TKR on 09/11/22.  She is currently walking with crutches.   She had her Left done several years ago. Her main concern is her strength stiffness.  She can sit for an hour.  Stand for 30 and/or walk for 30 minutes.   PERTINENT HISTORY: Lt TKR, OA, back pain  PAIN:  Are you having pain? Yes: NPRS scale: 0/10; max of 3  Pain location: Rt knee  Pain description: throb Aggravating factors: trying to bend the knee  Relieving factors: CPM  PRECAUTIONS: None   WEIGHT BEARING RESTRICTIONS: WBAT  FALLS:  Has patient fallen in last 6 months? No  LIVING ENVIRONMENT: Lives with: lives with their family Lives in:  House/apartment Stairs: Yes: External: 4 steps; on right going up Has following equipment at home: Single point cane, Environmental consultant - 2 wheeled, and Crutches  OCCUPATIONOffice manager up and down   PLOF: Independent  PATIENT GOALS: Walk without assistive device, better strength   NEXT MD VISIT: 09/27/22  OBJECTIVE:    PATIENT SURVEYS:  FOTO 39  COGNITION: Overall cognitive status: Within functional limits for tasks assessed     SENSATION: WFL  EDEMA: WNL for surgery   LOWER EXTREMITY ROM:  Active ROM Right eval Left eval  Hip flexion    Hip extension    Hip abduction    Hip adduction    Hip internal rotation    Hip external rotation    Knee flexion 50   Knee extension Lacking 10 degrees    Ankle dorsiflexion    Ankle plantarflexion    Ankle inversion    Ankle eversion     (Blank rows = not tested)  LOWER EXTREMITY MMT:  MMT Right eval Left eval  Hip flexion 2   Hip extension  Hip abduction 3+   Hip adduction    Hip internal rotation    Hip external rotation    Knee flexion 2+   Knee extension 2+   Ankle dorsiflexion 5   Ankle plantarflexion    Ankle inversion    Ankle eversion     (Blank rows = not tested)   FUNCTIONAL TESTS:  30 seconds chair stand test: unable to come sit to stand without use of UE assist.  2 minute walk test: 226 ft with crutches  Single leg stance: RT: unable    TODAY'S TREATMENT:                                                                                                                              DATE: 09/27/22 Eval - Seated Long Arc Quad   5- 3-5 seconds  hold - Supine Quadricep Sets  -10 reps - 5"  hold - Supine Heel Slide   5 reps - 3-5" hold - Supine Heel Slide with Strap  - 5 reps - 3-5" hold Side lying Rt hip abduction x 10 Prone knee flexion x 10    PATIENT EDUCATION:  Education details: HEP Person educated: Patient Education method: Programmer, multimedia, Verbal cues, and Handouts Education comprehension:  returned demonstration  HOME EXERCISE PROGRAM: Access Code: [email or web address] URL: [email or web address] Date: 09/27/2022 Prepared by: Virgina Organ  Exercises - Seated Long Arc Quad  - 6 x daily - 7 x weekly - 1 sets - 5-10 reps - 3-5 seconds  hold - Supine Quadricep Sets  - 3 x daily - 7 x weekly - 1 sets - 10 reps - 5"  hold - Supine Heel Slide  - 3 x daily - 7 x weekly - 1 sets - 5 reps - 3-5" hold - Supine Heel Slide with Strap  - 3 x daily - 7 x weekly - 1 sets - 5 reps - 3-5" hold Side lying abduction x 10 ASSESSMENT:  CLINICAL IMPRESSION: Patient is a 57 y.o. female who was seen today for physical therapy evaluation and treatment for evaluation and treatment following a RT TKR.  Evaluation demonstrates decreased ROM, decreased activity tolerance, decreased ROM, decreased balance, increased edema and increased pain.  Mr. Beverley Fiedler will benefit from skilled PT to address these issues and progress her to her maximal functioning level.   OBJECTIVE IMPAIRMENTS: decreased activity tolerance, decreased balance, difficulty walking, decreased ROM, decreased strength, increased edema, and pain.   ACTIVITY LIMITATIONS: carrying, lifting, bending, sitting, standing, squatting, sleeping, stairs, and locomotion level  PARTICIPATION LIMITATIONS: cleaning, laundry, driving, shopping, community activity, and yard work   [facility] POTENTIAL: Good  CLINICAL DECISION MAKING: Stable/uncomplicated  EVALUATION COMPLEXITY: Low   GOALS: Goals reviewed with patient? No  SHORT TERM GOALS: Target date: 10/18/22 Pt to be I in her HEP in order to decrease her pain to no greater than a 1/10 Baseline: Goal status: INITIAL  2.  Pt ROM to improve to  90  to allow increased comfort while sitting with her leg bent for  an hour. Baseline:  Goal status: INITIAL  3.  Pt LE strength to be increased 1/2 grade to be able to walk with a cane.   Baseline:  Goal status: INITIAL  LONG TERM GOALS: Target  date: 11/08/22  Pt to be I in her HEP in order to increase her flexion to 115 to allow pt to squat down to the ground  Baseline:  Goal status: INITIAL  2.  PT to be able to single leg stance on both LE for 20 " in order to reduce risk of falling  Baseline:  Goal status: INITIAL  3.   Pt LE strength to be increased 1 grade to be able to walk without an assistive device and to be able to be up for 2 hr at a time to return to work. Baseline:  Goal status: INITIAL  4.  PT to be able to go up and down 12 steps in a reciprocal manner  Baseline:  Goal status: INITIAL      PLAN:  PT FREQUENCY: 2x/week  PT DURATION: 6 weeks  PLANNED INTERVENTIONS: Therapeutic exercises, Therapeutic activity, Balance training, Gait training, Patient/Family education, Self Care, Joint mobilization, and Manual therapy  PLAN FOR NEXT SESSION: Continue to progress ROM, strength and balancel    Virgina Organ, PT CLT [phone]  09/27/2022, 11:50 AM

## 2022-09-27 NOTE — Progress Notes (Signed)
Chief Complaint  Patient presents with   Post-op Follow-up    Right knee total knee replaced 09/11/22 improving didn't have home PT due to [facility] has started outpatient therapy /used CPM   Pod 16   Erica Graves is doing well despite not getting any in-house in home therapy staples out wound looks good no signs of DVT no sign of infection  Currently taking Tylenol for pain  Patient has started outpatient therapy in [place] will continue that and come back and see me in 3 weeks     amLODipine 5 MG tablet Commonly known as: NORVASC Take 1 tablet (5 mg total) by mouth daily.    APPLE CIDER VINEGAR PO Take 1 capsule by mouth daily.    aspirin 81 MG chewable tablet Chew 1 tablet (81 mg total) by mouth 2 (two) times daily.    cholecalciferol 25 MCG (1000 UNIT) tablet Commonly known as: VITAMIN D3 Take 1,000 Units by mouth daily.    methocarbamol 500 MG tablet Commonly known as: ROBAXIN Take 1 tablet (500 mg total) by mouth every 6 (six) hours as needed for muscle spasms.    multivitamin with minerals Tabs tablet Take 1 tablet by mouth daily.    naproxen 500 MG tablet Commonly known as: NAPROSYN Take 1 tablet (500 mg total) by mouth 2 (two) times daily with a meal.    oxyCODONE 5 MG immediate release tablet Commonly known as: Oxy IR/ROXICODONE Take 1 tablet (5 mg total) by mouth every 4 (four) hours for 7 days. Acute post op pain    pantoprazole 40 MG tablet Commonly known as: PROTONIX Take 40 mg by mouth daily.    polyethylene glycol 17 g packet Commonly known as: MIRALAX / GLYCOLAX Take 17 g by mouth daily as needed for mild constipation.    promethazine 12.5 MG tablet Commonly known as: PHENERGAN Take 1 tablet (12.5 mg total) by mouth every 6 (six) hours as needed for nausea or vomiting.    traMADol 50 MG tablet Commonly known as: ULTRAM Take 1 tablet (50 mg total) by mouth every 6 (six) hours for 7 days. Acute post op pain    Implant Name Type Inv. Item  Serial No. Manufacturer Lot No. LRB No. Used Action  CEMENT HV SMART SET - [id number] Cement CEMENT HV SMART SET   [employer] [id number] Right 2 Implanted  INSERT TIB ATTUNE FB [id number] - [id number] Insert INSERT TIB ATTUNE FB [id number]   [employer] [id number] Right 1 Implanted  PATELLA MEDIAL ATTUN KNEE - [id number] Knees PATELLA MEDIAL ATTUN KNEE   [employer] [id number] Right 1 Implanted  ATTUNE PSFEM [id number] NARCEM KNEE - [id number] Femur ATTUNE PSFEM [id number] NARCEM KNEE   [employer] [id number] Right 1 Implanted  BSPLAT TIB 4 CMNT FX BRNG STRL - [id number] Stem BSPLAT TIB 4 CMNT FX BRNG STRL   [employer] [id number] Right 1 Implanted

## 2022-09-28 ENCOUNTER — Ambulatory Visit
Admission: EM | Admit: 2022-09-28 | Discharge: 2022-09-28 | Disposition: A | Discharge status: Home / Self Care | Payer: BC Managed Care – PPO | Attending: Nurse Practitioner | Admitting: Nurse Practitioner

## 2022-09-28 ENCOUNTER — Encounter: Payer: Self-pay | Admitting: Emergency Medicine

## 2022-09-28 ENCOUNTER — Other Ambulatory Visit: Payer: Self-pay

## 2022-09-28 DIAGNOSIS — Z87442 Personal history of urinary calculi: Secondary | ICD-10-CM | POA: Diagnosis not present

## 2022-09-28 DIAGNOSIS — R399 Unspecified symptoms and signs involving the genitourinary system: Secondary | ICD-10-CM

## 2022-09-28 DIAGNOSIS — R300 Dysuria: Secondary | ICD-10-CM

## 2022-09-28 LAB — POCT URINALYSIS DIP (MANUAL ENTRY)
Glucose, UA: NEGATIVE mg/dL
Ketones, POC UA: NEGATIVE mg/dL
Leukocytes, UA: NEGATIVE
Nitrite, UA: NEGATIVE
Spec Grav, UA: >=1.03 — AB (ref 1.010–1.025)
Urobilinogen, UA: 1 E.U./dL
pH, UA: 5.5 (ref 5.0–8.0)

## 2022-09-28 MED ORDER — NAPROXEN 500 MG PO TABS: 500.0000 mg | ORAL_TABLET | Freq: Two times a day (BID) | ORAL | 0 refills | Status: DC

## 2022-09-28 MED ORDER — KETOROLAC TROMETHAMINE 30 MG/ML IJ SOLN
30.0000 mg | Freq: Once | INTRAMUSCULAR | Status: AC
  Administered 2022-09-28: 30 mg via INTRAMUSCULAR

## 2022-09-28 MED ORDER — TAMSULOSIN HCL 0.4 MG PO CAPS: 0.4000 mg | ORAL_CAPSULE | Freq: Every day | ORAL | 0 refills | Status: AC

## 2022-09-28 NOTE — ED Triage Notes (Signed)
Pt reports intermittent abd pressure, lower back pain, dysuria x1 week.

## 2022-09-28 NOTE — ED Notes (Signed)
Strainer provided

## 2022-09-28 NOTE — Discharge Instructions (Addendum)
The urinalysis does not indicate an obvious urinary tract infection.  A urine culture has been ordered.  You will be contacted if the results of the urine culture are abnormal to provide treatment. Take medication as prescribed. Make sure you are drinking plenty of fluids.  Try to drink at least 8-10 8 ounce glasses of water daily while symptoms persist. Strain each urine to determine if you have passed a kidney stone. As discussed, the preferred imaging for diagnosis of a kidney stone is a CT scan. If you develop worsening low back pain, or urinary symptoms, along with new symptoms of fever, chills, or other concerns, please follow-up in the emergency department immediately for further evaluation. Follow-up as needed.

## 2022-09-28 NOTE — ED Provider Notes (Signed)
[facility]    CSN: [id number] Arrival date & time: 09/28/22  1102      History   Chief Complaint Chief Complaint  Patient presents with   Dysuria    HPI Erica Graves is a 57 y.o. female.   The history is provided by the patient.   The patient presents for 1 week history of pain with urination lower abdominal pressure, nausea, low back pain and urinary urgency.  Patient denies fever, chills, chest pain, hematuria, decreased urine stream, flank pain, vomiting or diarrhea.  Patient reports that she does have a history of low back pain at baseline.  She also reports that she does have a history of kidney stones.    Past Medical History:  Diagnosis Date   Carpal tunnel syndrome, bilateral    Chronic low back pain    Constipation    Counseling for estrogen replacement therapy 07/07/2014   Depression with anxiety    Dyspareunia 07/07/2014   GAD (generalized anxiety disorder) 04/07/2020   History of kidney stones    Hot flashes 07/07/2014   Hypertension    Menopause 07/07/2014   Moody 07/07/2014   Obesity    Umbilical hernia     Patient Active Problem List   Diagnosis Date Noted   S/P total knee replacement, right 09/11/22 09/25/2022   Primary localized osteoarthritis of right knee 09/11/2022   Caregiver stress 07/06/2022   Abdominal pain, epigastric 12/11/2021   Chronic idiopathic constipation 11/10/2021   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   Arthritis of carpometacarpal (CMC) joint of right thumb 10/07/2020   Radial styloid tenosynovitis (de quervain) 10/07/2020   Irritable mood 04/07/2020   GAD (generalized anxiety disorder) 04/07/2020   Vaginal itching 06/05/2019   Screening for colorectal cancer 06/05/2019   Screening for cholesterol level 06/05/2019   S/P trigger finger release 01/24/17 02/06/2017   Trigger finger of right thumb    Essential hypertension, benign 11/18/2015   Gastroesophageal reflux disease without esophagitis 05/06/2015   Hot flashes  07/07/2014   Dyspareunia 07/07/2014   Moody 07/07/2014   Menopause 07/07/2014   Counseling for estrogen replacement therapy 07/07/2014   Morbid obesity (HCC) 03/05/2014   Carpal tunnel syndrome of right wrist 12/22/2013   Vitamin D deficiency 10/14/2013   Migraine headache without aura 11/24/2012   Muscle contraction headache 11/24/2012   CTS (carpal tunnel syndrome) 08/14/2012   Stiffness of joint, not elsewhere classified, lower leg 04/22/2012   Weakness of left leg 04/22/2012   Difficulty walking 04/22/2012   S/P total knee replacement, left 03/31/12 04/03/2012   Arthritis of knee, degenerative 03/05/2012   Osteoarthritis of left knee 12/12/2011   S/P arthroscopy of left knee 06/13/2011   Lateral meniscus tear 06/13/2011   Stiffness of knee joint 06/05/2011   Medial meniscus, posterior horn derangement 05/16/2011   DEGENERATIVE JOINT DISEASE, KNEES, BILATERAL 12/19/2009   PATELLO-FEMORAL SYNDROME 12/19/2009   FOOT PAIN, LEFT 07/22/2007   DEPRESSION/ANXIETY 12/27/2006   SHOULDER PAIN, LEFT 12/27/2006   OVERWEIGHT 11/12/2006   CONSTIPATION 11/12/2006   Lumbago 11/12/2006    Past Surgical History:  Procedure Laterality Date   ABDOMINAL HYSTERECTOMY     BACK SURGERY     removal of cyst subdermal   CARPAL TUNNEL RELEASE Left 09/30/2014   Procedure: CARPAL TUNNEL RELEASE;  Surgeon: Vickki Hearing, MD;  Location: [facility];  Service: Orthopedics;  Laterality: Left;   CARPAL TUNNEL RELEASE Right 09/28/2015   Procedure: CARPAL TUNNEL RELEASE;  Surgeon: Vickki Hearing, MD;  Location:  [facility];  Service: Orthopedics;  Laterality: Right;   COLONOSCOPY N/A 06/29/2013   Procedure: COLONOSCOPY;  Surgeon: West Bali, MD;  Location: [facility];  Service: Endoscopy;  Laterality: N/A;  1:30   ESOPHAGOGASTRODUODENOSCOPY (EGD) WITH PROPOFOL N/A 01/04/2022   Procedure: ESOPHAGOGASTRODUODENOSCOPY (EGD) WITH PROPOFOL;  Surgeon: Lanelle Bal, DO;  Location: [facility];  Service:  Endoscopy;  Laterality: N/A;  12:30pm, asa 2   KNEE ARTHROSCOPY Right    KNEE ARTHROSCOPY  05/21/2011   Procedure: ARTHROSCOPY KNEE;  Surgeon: Vickki Hearing, MD;  Location: [facility];  Service: Orthopedics;  Laterality: Left;  lateral menisectomy   KNEE CLOSED REDUCTION Left 05/26/2012   Procedure: CLOSED MANIPULATION KNEE;  Surgeon: Vickki Hearing, MD;  Location: [facility];  Service: Orthopedics;  Laterality: Left;   left knee arthroscopy  08/14/2005 Dr. Romeo Apple torn meniscus   LUMBAR SPINE SURGERY     PARTIAL HYSTERECTOMY     TOTAL KNEE ARTHROPLASTY  03/31/2012   Procedure: TOTAL KNEE ARTHROPLASTY;  Surgeon: Vickki Hearing, MD;  Location: [facility];  Service: Orthopedics;  Laterality: Left;  Left Total Knee Arthroplasty   TOTAL KNEE ARTHROPLASTY Right 09/11/2022   Procedure: TOTAL KNEE ARTHROPLASTY;  Surgeon: Vickki Hearing, MD;  Location: [facility];  Service: Orthopedics;  Laterality: Right;   TRIGGER FINGER RELEASE Right 01/24/2017   Procedure: RELEASE TRIGGER FINGER/A-1 PULLEY right thumb;  Surgeon: Vickki Hearing, MD;  Location: [facility];  Service: Orthopedics;  Laterality: Right;    OB History     Gravida  3   Para  3   Term      Preterm      AB      Living         SAB      IAB      Ectopic      Multiple      Live Births               Home Medications    Prior to Admission medications   Medication Sig Start Date End Date Taking? Authorizing Provider  naproxen (NAPROSYN) 500 MG tablet Take 1 tablet (500 mg total) by mouth 2 (two) times daily. 09/28/22  Yes -Warren, Sadie Haber, NP  tamsulosin (FLOMAX) 0.4 MG CAPS capsule Take 1 capsule (0.4 mg total) by mouth daily after supper. 09/28/22  Yes -Warren, Sadie Haber, NP  amLODipine (NORVASC) 5 MG tablet Take 1 tablet (5 mg total) by mouth daily. 07/06/22   Campbell Riches, NP  APPLE CIDER VINEGAR PO Take 1 capsule by mouth daily.    [provider]  aspirin 81 MG chewable tablet Chew 1  tablet (81 mg total) by mouth 2 (two) times daily. 09/12/22   Vickki Hearing, MD  cholecalciferol (VITAMIN D3) 25 MCG (1000 UNIT) tablet Take 1,000 Units by mouth daily.    [provider]  methocarbamol (ROBAXIN) 500 MG tablet Take 1 tablet (500 mg total) by mouth every 6 (six) hours as needed for muscle spasms. 09/12/22   Vickki Hearing, MD  Multiple Vitamin (MULTIVITAMIN WITH MINERALS) TABS tablet Take 1 tablet by mouth daily.    [provider]  ondansetron (ZOFRAN) 4 MG tablet Take 1 tablet (4 mg total) by mouth every 4 (four) hours as needed for nausea or vomiting. 09/12/22   Vickki Hearing, MD  pantoprazole (PROTONIX) 40 MG tablet Take 40 mg by mouth daily. 07/28/22   [provider]  polyethylene  glycol (MIRALAX / GLYCOLAX) 17 g packet Take 17 g by mouth daily as needed for mild constipation. 09/12/22   Vickki Hearing, MD    Family History Family History  Problem Relation Age of Onset   Heart disease Mother    Hypertension Mother    Colon cancer Neg Hx     Social History Social History   Tobacco Use   Smoking status: Never   Smokeless tobacco: Never  Vaping Use   Vaping status: Never Used  Substance Use Topics   Alcohol use: Yes    Comment: occ   Drug use: No     Allergies   Norco [hydrocodone-acetaminophen]   Review of Systems Review of Systems Per HPI  Physical Exam Triage Vital Signs ED Triage Vitals  Encounter Vitals Group     BP 09/28/22 1141 120/80     Systolic BP Percentile --      Diastolic BP Percentile --      Pulse Rate 09/28/22 1141 81     Resp 09/28/22 1141 20     Temp 09/28/22 1141 98.3 F (36.8 C)     Temp Source 09/28/22 1141 Oral     SpO2 09/28/22 1141 98 %     Weight --      Height --      Head Circumference --      Peak Flow --      Pain Score 09/28/22 1140 8     Pain Loc --      Pain Education --      Exclude from Growth Chart --    No data found.  Updated Vital Signs BP 120/80 (BP  Location: Right Arm)   Pulse 81   Temp 98.3 F (36.8 C) (Oral)   Resp 20   SpO2 98%   Visual Acuity Right Eye Distance:   Left Eye Distance:   Bilateral Distance:    Right Eye Near:   Left Eye Near:    Bilateral Near:     Physical Exam Vitals and nursing note reviewed.  Constitutional:      General: She is not in acute distress.    Appearance: Normal appearance.  HENT:     Head: Normocephalic.  Eyes:     Extraocular Movements: Extraocular movements intact.     Pupils: Pupils are equal, round, and reactive to light.  Cardiovascular:     Rate and Rhythm: Normal rate and regular rhythm.     Pulses: Normal pulses.     Heart sounds: Normal heart sounds.  Pulmonary:     Effort: Pulmonary effort is normal.     Breath sounds: Normal breath sounds.  Abdominal:     General: Bowel sounds are normal. There is no distension.     Palpations: Abdomen is soft.     Tenderness: There is abdominal tenderness in the suprapubic area. There is left CVA tenderness.  Musculoskeletal:     Cervical back: Normal range of motion.  Lymphadenopathy:     Cervical: No cervical adenopathy.  Skin:    General: Skin is warm and dry.  Neurological:     General: No focal deficit present.     Mental Status: She is alert and oriented to person, place, and time.  Psychiatric:        Mood and Affect: Mood normal.        Behavior: Behavior normal.      UC Treatments / Results  Labs (all labs ordered are listed, but only abnormal results  are displayed) Labs Reviewed  POCT URINALYSIS DIP (MANUAL ENTRY) - Abnormal; Notable for the following components:      Result Value   Bilirubin, UA small (*)    Spec Grav, UA >=1.030 (*)    Blood, UA trace-intact (*)    Protein Ur, POC trace (*)    All other components within normal limits  URINE CULTURE    EKG   Radiology No results found.  Procedures Procedures (including critical care time)  Medications Ordered in UC Medications  ketorolac  (TORADOL) 30 MG/ML injection 30 mg (30 mg Intramuscular Given 09/28/22 1230)    Initial Impression / Assessment and Plan / UC Course  I have reviewed the triage vital signs and the nursing notes.  Pertinent labs & imaging results that were available during my care of the patient were reviewed by me and considered in my medical decision making (see chart for details).  The patient is well-appearing, she is in no acute distress, vital signs are stable.  Urinalysis does not indicate an obvious urinary tract infection, urine culture is pending. On exam, she does have left flank pain, and urinalysis is positive for blood and protein.  Suspect symptoms may be related to a possible kidney stone.  Toradol 30 mg IM administered for low back pain.  Tamsulosin 0.4 mg prescribed to increase urinary flow, and Naprosyn 500mg  for pain.  Advised patient that the preferred imaging for a definitive diagnosis for a kidney stone is a CT scan.  Supportive care recommendations were provided and discussed with the patient to include increasing her fluid intake, pain management, and straining each urine.  Patient was advised that if symptoms worsen, recommend following up in the emergency department for further evaluation.  Patient is in agreement with this plan of care and verbalizes understanding.  All questions were answered.  Patient stable for discharge.  Final Clinical Impressions(s) / UC Diagnoses   Final diagnoses:  UTI symptoms  Dysuria  History of kidney stones     Discharge Instructions      The urinalysis does not indicate an obvious urinary tract infection.  A urine culture has been ordered.  You will be contacted if the results of the urine culture are abnormal to provide treatment. Take medication as prescribed. Make sure you are drinking plenty of fluids.  Try to drink at least 8-10 8 ounce glasses of water daily while symptoms persist. Strain each urine to determine if you have passed a kidney  stone. As discussed, the preferred imaging for diagnosis of a kidney stone is a CT scan. If you develop worsening low back pain, or urinary symptoms, along with new symptoms of fever, chills, or other concerns, please follow-up in the emergency department immediately for further evaluation. Follow-up as needed.     ED Prescriptions     Medication Sig Dispense Auth. Provider   naproxen (NAPROSYN) 500 MG tablet Take 1 tablet (500 mg total) by mouth 2 (two) times daily. 30 tablet -Warren, Sadie Haber, NP   tamsulosin (FLOMAX) 0.4 MG CAPS capsule Take 1 capsule (0.4 mg total) by mouth daily after supper. 30 capsule -Warren, Sadie Haber, NP      PDMP not reviewed this encounter.   Abran Cantor, NP 09/28/22 1301

## 2022-09-28 NOTE — ED Notes (Signed)
Patient is resting comfortably. 

## 2022-10-02 ENCOUNTER — Encounter (HOSPITAL_COMMUNITY): Payer: Self-pay

## 2022-10-02 ENCOUNTER — Ambulatory Visit (HOSPITAL_COMMUNITY): Payer: BC Managed Care – PPO

## 2022-10-02 DIAGNOSIS — M25561 Pain in right knee: Secondary | ICD-10-CM | POA: Diagnosis not present

## 2022-10-02 DIAGNOSIS — M25661 Stiffness of right knee, not elsewhere classified: Secondary | ICD-10-CM

## 2022-10-02 DIAGNOSIS — M6281 Muscle weakness (generalized): Secondary | ICD-10-CM

## 2022-10-02 NOTE — Therapy (Signed)
OUTPATIENT PHYSICAL THERAPY LOWER EXTREMITY TREATMENT   Patient Name: Erica Graves MRN: [id number] DOB:[birth year], 57 y.o., female Today's Date: 10/02/2022  END OF SESSION: END OF SESSION:   PT End of Session - 10/02/22 1300     Visit Number 2    Number of Visits 45    Date for PT Re-Evaluation 11/08/22    Authorization Type bc bs    Progress Note Due on Visit 10    PT Start Time 1301    PT Stop Time 1347    PT Time Calculation (min) 46 min    Activity Tolerance Patient tolerated treatment well;No increased pain    Behavior During Therapy [facility] for tasks assessed/performed                Past Medical History:  Diagnosis Date   Carpal tunnel syndrome, bilateral    Chronic low back pain    Constipation    Counseling for estrogen replacement therapy 07/07/2014   Depression with anxiety    Dyspareunia 07/07/2014   GAD (generalized anxiety disorder) 04/07/2020   History of kidney stones    Hot flashes 07/07/2014   Hypertension    Menopause 07/07/2014   Moody 07/07/2014   Obesity    Umbilical hernia    Past Surgical History:  Procedure Laterality Date   ABDOMINAL HYSTERECTOMY     BACK SURGERY     removal of cyst subdermal   CARPAL TUNNEL RELEASE Left 09/30/2014   Procedure: CARPAL TUNNEL RELEASE;  Surgeon: Vickki Hearing, MD;  Location: [facility];  Service: Orthopedics;  Laterality: Left;   CARPAL TUNNEL RELEASE Right 09/28/2015   Procedure: CARPAL TUNNEL RELEASE;  Surgeon: Vickki Hearing, MD;  Location: [facility];  Service: Orthopedics;  Laterality: Right;   COLONOSCOPY N/A 06/29/2013   Procedure: COLONOSCOPY;  Surgeon: West Bali, MD;  [facility]: [facility];  Service: Endoscopy;  Laterality: N/A;  1:30   ESOPHAGOGASTRODUODENOSCOPY (EGD) WITH PROPOFOL N/A 01/04/2022   Procedure: ESOPHAGOGASTRODUODENOSCOPY (EGD) WITH PROPOFOL;  Surgeon: Lanelle Bal, DO;  [facility];  Service: Endoscopy;  Laterality: N/A;  12:30pm, asa 2   KNEE ARTHROSCOPY Right     KNEE ARTHROSCOPY  05/21/2011   Procedure: ARTHROSCOPY KNEE;  Surgeon: Vickki Hearing, MD;  Location: [facility];  Service: Orthopedics;  Laterality: Left;  lateral menisectomy   KNEE CLOSED REDUCTION Left 05/26/2012   Procedure: CLOSED MANIPULATION KNEE;  Surgeon: Vickki Hearing, MD;  Location: [facility];  Service: Orthopedics;  Laterality: Left;   left knee arthroscopy  08/14/2005 Dr. Romeo Apple torn meniscus   LUMBAR SPINE SURGERY     PARTIAL HYSTERECTOMY     TOTAL KNEE ARTHROPLASTY  03/31/2012   Procedure: TOTAL KNEE ARTHROPLASTY;  Surgeon: Vickki Hearing, MD;  Location: [facility];  Service: Orthopedics;  Laterality: Left;  Left Total Knee Arthroplasty   TOTAL KNEE ARTHROPLASTY Right 09/11/2022   Procedure: TOTAL KNEE ARTHROPLASTY;  Surgeon: Vickki Hearing, MD;  Location: [facility];  Service: Orthopedics;  Laterality: Right;   TRIGGER FINGER RELEASE Right 01/24/2017   Procedure: RELEASE TRIGGER FINGER/A-1 PULLEY right thumb;  Surgeon: Vickki Hearing, MD;  Location: [facility];  Service: Orthopedics;  Laterality: Right;   Patient Active Problem List   Diagnosis Date Noted   S/P total knee replacement, right 09/11/22 09/25/2022   Primary localized osteoarthritis of right knee 09/11/2022   Caregiver stress 07/06/2022   Abdominal pain, epigastric 12/11/2021   Chronic idiopathic constipation 11/10/2021   Intersection  syndrome 12/22/2020   Arthritis of carpometacarpal (CMC) joint of right thumb 10/07/2020   Radial styloid tenosynovitis (de quervain) 10/07/2020   Irritable mood 04/07/2020   GAD (generalized anxiety disorder) 04/07/2020   Vaginal itching 06/05/2019   Screening for colorectal cancer 06/05/2019   Screening for cholesterol level 06/05/2019   S/P trigger finger release 01/24/17 02/06/2017   Trigger finger of right thumb    Essential hypertension, benign 11/18/2015   Gastroesophageal reflux disease without esophagitis 05/06/2015   Hot flashes 07/07/2014   Dyspareunia 07/07/2014    Moody 07/07/2014   Menopause 07/07/2014   Counseling for estrogen replacement therapy 07/07/2014   Morbid obesity (HCC) 03/05/2014   Carpal tunnel syndrome of right wrist 12/22/2013   Vitamin D deficiency 10/14/2013   Migraine headache without aura 11/24/2012   Muscle contraction headache 11/24/2012   CTS (carpal tunnel syndrome) 08/14/2012   Stiffness of joint, not elsewhere classified, lower leg 04/22/2012   Weakness of left leg 04/22/2012   Difficulty walking 04/22/2012   S/P total knee replacement, left 03/31/12 04/03/2012   Arthritis of knee, degenerative 03/05/2012   Osteoarthritis of left knee 12/12/2011   S/P arthroscopy of left knee 06/13/2011   Lateral meniscus tear 06/13/2011   Stiffness of knee joint 06/05/2011   Medial meniscus, posterior horn derangement 05/16/2011   DEGENERATIVE JOINT DISEASE, KNEES, BILATERAL 12/19/2009   PATELLO-FEMORAL SYNDROME 12/19/2009   FOOT PAIN, LEFT 07/22/2007   DEPRESSION/ANXIETY 12/27/2006   SHOULDER PAIN, LEFT 12/27/2006   OVERWEIGHT 11/12/2006   CONSTIPATION 11/12/2006   Lumbago 11/12/2006    PCP: Lilyan Punt   REFERRING PROVIDER: Vickki Hearing, MD  REFERRING DIAG: M17.11 (ICD-10-CM) - Unilateral primary osteoarthritis, right knee  THERAPY DIAG:  Right knee pain Right knee stiffness Muscle weakness Difficulty in walking.  Rationale for Evaluation and Treatment: Rehabilitation  ONSET DATE: 09/11/2022    SUBJECTIVE STATEMENT: 10/02/22:  PT arrived ambulating with crutches, no reports of pain currently just a little tightness.  Has began HEP and feels she is getting stronger with ability to raise legs.  Eval:  PT had a TKR on 09/11/22.  She is currently walking with crutches.   She had her Left done several years ago. Her main concern is her strength stiffness.  She can sit for an hour.  Stand for 30 and/or walk for 30 minutes.   PERTINENT HISTORY: Lt TKR, OA, back pain  PAIN:  Are you having pain? Yes: NPRS scale:  0/10; max of 3  Pain location: Rt knee  Pain description: throb Aggravating factors: trying to bend the knee  Relieving factors: CPM  PRECAUTIONS: None   WEIGHT BEARING RESTRICTIONS: WBAT  FALLS:  Has patient fallen in last 6 months? No  LIVING ENVIRONMENT: Lives with: lives with their family Lives in: House/apartment Stairs: Yes: External: 4 steps; on right going up Has following equipment at home: Single point cane, Walker - 2 wheeled, and Crutches  OCCUPATIONOffice manager up and down   PLOF: Independent  PATIENT GOALS: Walk without assistive device, better strength   NEXT MD VISIT: 09/27/22  OBJECTIVE:    PATIENT SURVEYS:  FOTO 39  COGNITION: Overall cognitive status: Within functional limits for tasks assessed     SENSATION: WFL  EDEMA: WNL for surgery   LOWER EXTREMITY ROM:  Active ROM Right eval Left eval 10/02/22 AROM:  Hip flexion     Hip extension     Hip abduction     Hip adduction     Hip internal rotation  Hip external rotation     Knee flexion 50  82  Knee extension Lacking 10 degrees   8  Ankle dorsiflexion     Ankle plantarflexion     Ankle inversion     Ankle eversion      (Blank rows = not tested)  LOWER EXTREMITY MMT:  MMT Right eval Left eval  Hip flexion 2   Hip extension    Hip abduction 3+   Hip adduction    Hip internal rotation    Hip external rotation    Knee flexion 2+   Knee extension 2+   Ankle dorsiflexion 5   Ankle plantarflexion    Ankle inversion    Ankle eversion     (Blank rows = not tested)   FUNCTIONAL TESTS:  30 seconds chair stand test: unable to come sit to stand without use of UE assist.  2 minute walk test: 226 ft with crutches  Single leg stance: RT: unable    TODAY'S TREATMENT:                                                                                                                              DATE:  10/02/22: Reviewed goals Educated importance of HEP compliance for maximal  benefits Supine:   -quad sets 10x 5"  - heel slide with strap 10x 5" holds at end range  -AROM 8-82 degrees  -SAQ 10x 5"  -Bridge 10x5  -SLR with quad set prior range 10x Sidelying:  -abduction Manual in supine with LE elevated:  -decongestive massage  -Scar tissue massage Ambulate with 1 crutch x 22ft with good mechanics Bike rocking seat 13  09/27/22 Eval - Seated Long Arc Quad   5- 3-5 seconds  hold - Supine Quadricep Sets  -10 reps - 5"  hold - Supine Heel Slide   5 reps - 3-5" hold - Supine Heel Slide with Strap  - 5 reps - 3-5" hold Side lying Rt hip abduction x 10 Prone knee flexion x 10    PATIENT EDUCATION:  Education details: HEP Person educated: Patient Education method: Programmer, multimedia, Verbal cues, and Handouts Education comprehension: returned demonstration  HOME EXERCISE PROGRAM: Access Code: [email or web address] URL: [email or web address] Date: 09/27/2022 Prepared by: Virgina Organ  Exercises - Seated Long Arc Quad  - 6 x daily - 7 x weekly - 1 sets - 5-10 reps - 3-5 seconds  hold - Supine Quadricep Sets  - 3 x daily - 7 x weekly - 1 sets - 10 reps - 5"  hold - Supine Heel Slide  - 3 x daily - 7 x weekly - 1 sets - 5 reps - 3-5" hold - Supine Heel Slide with Strap  - 3 x daily - 7 x weekly - 1 sets - 5 reps - 3-5" hold Side lying abduction x 10 ASSESSMENT:  CLINICAL IMPRESSION: 10/02/22:  Reviewed goals and educated importance of HEP compliance for maximal benefits.  Pt able to  recall and demonstrate appropriate mechanics with current exercise program.  Session focus with knee mobility and proximal strengthening.  Pt tolerated well with no reports of increased pain.  Improved AROM 8-82 degrees (was 10-50 degrees).  Edema present proximal knee with scar tissue present, manual techniques complete to address adhesions and decongestive techniques to assist with edema control.  Gait training with 1 crutch with proper sequence and good mechanics, encouraged to  reduce to 1 unless walking for long periods or uneven terrain.  Eval: Patient is a 57 y.o. female who was seen today for physical therapy evaluation and treatment for evaluation and treatment following a RT TKR.  Evaluation demonstrates decreased ROM, decreased activity tolerance, decreased ROM, decreased balance, increased edema and increased pain.  Mr. Beverley Fiedler will benefit from skilled PT to address these issues and progress her to her maximal functioning level.   OBJECTIVE IMPAIRMENTS: decreased activity tolerance, decreased balance, difficulty walking, decreased ROM, decreased strength, increased edema, and pain.   ACTIVITY LIMITATIONS: carrying, lifting, bending, sitting, standing, squatting, sleeping, stairs, and locomotion level  PARTICIPATION LIMITATIONS: cleaning, laundry, driving, shopping, community activity, and yard work   [facility] POTENTIAL: Good  CLINICAL DECISION MAKING: Stable/uncomplicated  EVALUATION COMPLEXITY: Low   GOALS: Goals reviewed with patient? No  SHORT TERM GOALS: Target date: 10/18/22 Pt to be I in her HEP in order to decrease her pain to no greater than a 1/10 Baseline: Goal status: IN PROGRESS  2.  Pt ROM to improve to  90  to allow increased comfort while sitting with her leg bent for  an hour. Baseline:  Goal status: IN PROGRESS  3.  Pt LE strength to be increased 1/2 grade to be able to walk with a cane.   Baseline:  Goal status: IN PROGRESS  LONG TERM GOALS: Target date: 11/08/22  Pt to be I in her HEP in order to increase her flexion to 115 to allow pt to squat down to the ground  Baseline:  Goal status: IN PROGRESS  2.  PT to be able to single leg stance on both LE for 20 " in order to reduce risk of falling  Baseline:  Goal status: IN PROGRESS  3.   Pt LE strength to be increased 1 grade to be able to walk without an assistive device and to be able to be up for 2 hr at a time to return to work. Baseline:  Goal status: IN PROGRESS  4.   PT to be able to go up and down 12 steps in a reciprocal manner  Baseline:  Goal status: IN PROGRESS      PLAN:  PT FREQUENCY: 2x/week  PT DURATION: 6 weeks  PLANNED INTERVENTIONS: Therapeutic exercises, Therapeutic activity, Balance training, Gait training, Patient/Family education, Self Care, Joint mobilization, and Manual therapy  PLAN FOR NEXT SESSION: Continue to progress ROM, strength and balance.  Next session add knee drives, TKE, STS and progress as able.    Becky Sax, LPTA/CLT; CBIS [phone]  Juel Burrow, PTA 10/02/2022, 1:58 PM  10/02/2022, 1:58 PM

## 2022-10-04 ENCOUNTER — Encounter (HOSPITAL_COMMUNITY): Payer: BC Managed Care – PPO

## 2022-10-08 ENCOUNTER — Ambulatory Visit (HOSPITAL_COMMUNITY): Payer: BC Managed Care – PPO

## 2022-10-08 ENCOUNTER — Encounter (HOSPITAL_COMMUNITY): Payer: Self-pay

## 2022-10-08 DIAGNOSIS — M25561 Pain in right knee: Secondary | ICD-10-CM

## 2022-10-08 DIAGNOSIS — M6281 Muscle weakness (generalized): Secondary | ICD-10-CM

## 2022-10-08 DIAGNOSIS — M25661 Stiffness of right knee, not elsewhere classified: Secondary | ICD-10-CM

## 2022-10-08 NOTE — Therapy (Signed)
OUTPATIENT PHYSICAL THERAPY LOWER EXTREMITY TREATMENT   Patient Name: Erica Graves MRN: [id number] DOB:1965/09/26, 57 y.o., female Today's Date: 10/08/2022  END OF SESSION: END OF SESSION:   PT End of Session - 10/08/22 1130     Visit Number 3    Number of Visits 45    Date for PT Re-Evaluation 11/08/22    Authorization Type bc bs    Progress Note Due on Visit 10    PT Start Time 1129                Past Medical History:  Diagnosis Date   Carpal tunnel syndrome, bilateral    Chronic low back pain    Constipation    Counseling for estrogen replacement therapy 07/07/2014   Depression with anxiety    Dyspareunia 07/07/2014   GAD (generalized anxiety disorder) 04/07/2020   History of kidney stones    Hot flashes 07/07/2014   Hypertension    Menopause 07/07/2014   Moody 07/07/2014   Obesity    Umbilical hernia    Past Surgical History:  Procedure Laterality Date   ABDOMINAL HYSTERECTOMY     BACK SURGERY     removal of cyst subdermal   CARPAL TUNNEL RELEASE Left 09/30/2014   Procedure: CARPAL TUNNEL RELEASE;  Surgeon: Vickki Hearing, MD;  Location: [facility];  Service: Orthopedics;  Laterality: Left;   CARPAL TUNNEL RELEASE Right 09/28/2015   Procedure: CARPAL TUNNEL RELEASE;  Surgeon: Vickki Hearing, MD;  Location: [facility];  Service: Orthopedics;  Laterality: Right;   COLONOSCOPY N/A 06/29/2013   Procedure: COLONOSCOPY;  Surgeon: West Bali, MD;  [facility];  Service: Endoscopy;  Laterality: N/A;  1:30   ESOPHAGOGASTRODUODENOSCOPY (EGD) WITH PROPOFOL N/A 01/04/2022   Procedure: ESOPHAGOGASTRODUODENOSCOPY (EGD) WITH PROPOFOL;  Surgeon: Lanelle Bal, DO;  [facility];  Service: Endoscopy;  Laterality: N/A;  12:30pm, asa 2   KNEE ARTHROSCOPY Right    KNEE ARTHROSCOPY  05/21/2011   Procedure: ARTHROSCOPY KNEE;  Surgeon: Vickki Hearing, MD;  Location: [facility];  Service: Orthopedics;  Laterality: Left;  lateral menisectomy   KNEE CLOSED  REDUCTION Left 05/26/2012   Procedure: CLOSED MANIPULATION KNEE;  Surgeon: Vickki Hearing, MD;  Location: [facility];  Service: Orthopedics;  Laterality: Left;   left knee arthroscopy  08/14/2005 Dr. Romeo Apple torn meniscus   LUMBAR SPINE SURGERY     PARTIAL HYSTERECTOMY     TOTAL KNEE ARTHROPLASTY  03/31/2012   Procedure: TOTAL KNEE ARTHROPLASTY;  Surgeon: Vickki Hearing, MD;  Location: [facility];  Service: Orthopedics;  Laterality: Left;  Left Total Knee Arthroplasty   TOTAL KNEE ARTHROPLASTY Right 09/11/2022   Procedure: TOTAL KNEE ARTHROPLASTY;  Surgeon: Vickki Hearing, MD;  Location: [facility];  Service: Orthopedics;  Laterality: Right;   TRIGGER FINGER RELEASE Right 01/24/2017   Procedure: RELEASE TRIGGER FINGER/A-1 PULLEY right thumb;  Surgeon: Vickki Hearing, MD;  Location: [facility];  Service: Orthopedics;  Laterality: Right;   Patient Active Problem List   Diagnosis Date Noted   S/P total knee replacement, right 09/11/22 09/25/2022   Primary localized osteoarthritis of right knee 09/11/2022   Caregiver stress 07/06/2022   Abdominal pain, epigastric 12/11/2021   Chronic idiopathic constipation 11/10/2021   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   Arthritis of carpometacarpal (CMC) joint of right thumb 10/07/2020   Radial styloid tenosynovitis (de quervain) 10/07/2020   Irritable mood 04/07/2020   GAD (generalized anxiety disorder) 04/07/2020   Vaginal itching  06/05/2019   Screening for colorectal cancer 06/05/2019   Screening for cholesterol level 06/05/2019   S/P trigger finger release 01/24/17 02/06/2017   Trigger finger of right thumb    Essential hypertension, benign 11/18/2015   Gastroesophageal reflux disease without esophagitis 05/06/2015   Hot flashes 07/07/2014   Dyspareunia 07/07/2014   Moody 07/07/2014   Menopause 07/07/2014   Counseling for estrogen replacement therapy 07/07/2014   Morbid obesity (HCC) 03/05/2014   Carpal tunnel syndrome of right wrist 12/22/2013    Vitamin D deficiency 10/14/2013   Migraine headache without aura 11/24/2012   Muscle contraction headache 11/24/2012   CTS (carpal tunnel syndrome) 08/14/2012   Stiffness of joint, not elsewhere classified, lower leg 04/22/2012   Weakness of left leg 04/22/2012   Difficulty walking 04/22/2012   S/P total knee replacement, left 03/31/12 04/03/2012   Arthritis of knee, degenerative 03/05/2012   Osteoarthritis of left knee 12/12/2011   S/P arthroscopy of left knee 06/13/2011   Lateral meniscus tear 06/13/2011   Stiffness of knee joint 06/05/2011   Medial meniscus, posterior horn derangement 05/16/2011   DEGENERATIVE JOINT DISEASE, KNEES, BILATERAL 12/19/2009   PATELLO-FEMORAL SYNDROME 12/19/2009   FOOT PAIN, LEFT 07/22/2007   DEPRESSION/ANXIETY 12/27/2006   SHOULDER PAIN, LEFT 12/27/2006   OVERWEIGHT 11/12/2006   CONSTIPATION 11/12/2006   Lumbago 11/12/2006    PCP: Lilyan Punt   REFERRING PROVIDER: Vickki Hearing, MD  REFERRING DIAG: M17.11 (ICD-10-CM) - Unilateral primary osteoarthritis, right knee  THERAPY DIAG:  Right knee pain Right knee stiffness Muscle weakness Difficulty in walking.  Rationale for Evaluation and Treatment: Rehabilitation  ONSET DATE: 09/11/2022    SUBJECTIVE STATEMENT: 10/08/22:  Knee is feeling stiff today, no reports of pain.  Has been compliant with HEP and has done some manual over scar that helped.  Eval:  PT had a TKR on 09/11/22.  She is currently walking with crutches.   She had her Left done several years ago. Her main concern is her strength stiffness.  She can sit for an hour.  Stand for 30 and/or walk for 30 minutes.   PERTINENT HISTORY: Lt TKR, OA, back pain  PAIN:  Are you having pain? Yes: NPRS scale: 0/10; max of 3  Pain location: Rt knee  Pain description: throb Aggravating factors: trying to bend the knee  Relieving factors: CPM  PRECAUTIONS: None   WEIGHT BEARING RESTRICTIONS: WBAT  FALLS:  Has patient fallen in  last 6 months? No  LIVING ENVIRONMENT: Lives with: lives with their family Lives in: House/apartment Stairs: Yes: External: 4 steps; on right going up Has following equipment at home: Single point cane, Walker - 2 wheeled, and Crutches  OCCUPATIONOffice manager up and down   PLOF: Independent  PATIENT GOALS: Walk without assistive device, better strength   NEXT MD VISIT: 10/18/22  OBJECTIVE:    PATIENT SURVEYS:  FOTO 39  COGNITION: Overall cognitive status: Within functional limits for tasks assessed     SENSATION: WFL  EDEMA: WNL for surgery   LOWER EXTREMITY ROM:  Active ROM Right eval Left eval 10/02/22 AROM: 10/08/22:  Hip flexion      Hip extension      Hip abduction      Hip adduction      Hip internal rotation      Hip external rotation      Knee flexion 50  82 88  Knee extension Lacking 10 degrees   8 6  Ankle dorsiflexion  Ankle plantarflexion      Ankle inversion      Ankle eversion       (Blank rows = not tested)  LOWER EXTREMITY MMT:  MMT Right eval Left eval  Hip flexion 2   Hip extension    Hip abduction 3+   Hip adduction    Hip internal rotation    Hip external rotation    Knee flexion 2+   Knee extension 2+   Ankle dorsiflexion 5   Ankle plantarflexion    Ankle inversion    Ankle eversion     (Blank rows = not tested)   FUNCTIONAL TESTS:  30 seconds chair stand test: unable to come sit to stand without use of UE assist.  2 minute walk test: 226 ft with crutches  Single leg stance: RT: unable    TODAY'S TREATMENT:                                                                                                                              DATE:  10/08/22: Bike rocking seat 13 Standing: -Knee drive on 82NF step 62Z 10" -Heel raises 10x 5" -Toe raises decline slope 10x 5" -TKE 10x 5" with RTB -Sit to stand 10 no HHA -Gait no AD good mechanics  -Slant board 3x 30" -wall squat 5x 5" Prone: -hamstring curl 10x  5" -quad st 3x 30" Supine: -heel slide 10x  -quad sets -AROM 6-88 degrees -Hamstring stretch 3x 30" with rope Seated: -LAQ 10x  10/02/22: Reviewed goals Educated importance of HEP compliance for maximal benefits Supine:   -quad sets 10x 5"  - heel slide with strap 10x 5" holds at end range  -AROM 8-82 degrees  -SAQ 10x 5"  -Bridge 10x5  -SLR with quad set prior range 10x Sidelying:  -abduction Manual in supine with LE elevated:  -decongestive massage  -Scar tissue massage Ambulate with 1 crutch x 223ft with good mechanics Bike rocking seat 13  09/27/22 Eval - Seated Long Arc Quad   5- 3-5 seconds  hold - Supine Quadricep Sets  -10 reps - 5"  hold - Supine Heel Slide   5 reps - 3-5" hold - Supine Heel Slide with Strap  - 5 reps - 3-5" hold Side lying Rt hip abduction x 10 Prone knee flexion x 10    PATIENT EDUCATION:  Education details: HEP Person educated: Patient Education method: Programmer, multimedia, Verbal cues, and Handouts Education comprehension: returned demonstration  HOME EXERCISE PROGRAM: Access Code: [email or web address] URL: [email or web address] Date: 09/27/2022 Prepared by: Virgina Organ  Exercises - Seated Long Arc Quad  - 6 x daily - 7 x weekly - 1 sets - 5-10 reps - 3-5 seconds  hold - Supine Quadricep Sets  - 3 x daily - 7 x weekly - 1 sets - 10 reps - 5"  hold - Supine Heel Slide  - 3 x daily - 7 x weekly - 1 sets - 5 reps -  3-5" hold - Supine Heel Slide with Strap  - 3 x daily - 7 x weekly - 1 sets - 5 reps - 3-5" hold Side lying abduction x 10 ASSESSMENT:  CLINICAL IMPRESSION: 10/08/22:  Session focus with knee mobility and proximal strengthening.  Added standing and prone strengthening exercises and stretches that was tolerated well.  Gait training complete without AD with good mechanics noted, able to demonstrate heel to toe mechanics with equal weight bearing and good arm swing; encouraged to continue with 1 AD on uneven terrain or long periods  of walking.  Eval: Patient is a 57 y.o. female who was seen today for physical therapy evaluation and treatment for evaluation and treatment following a RT TKR.  Evaluation demonstrates decreased ROM, decreased activity tolerance, decreased ROM, decreased balance, increased edema and increased pain.  Mr. Beverley Fiedler will benefit from skilled PT to address these issues and progress her to her maximal functioning level.   OBJECTIVE IMPAIRMENTS: decreased activity tolerance, decreased balance, difficulty walking, decreased ROM, decreased strength, increased edema, and pain.   ACTIVITY LIMITATIONS: carrying, lifting, bending, sitting, standing, squatting, sleeping, stairs, and locomotion level  PARTICIPATION LIMITATIONS: cleaning, laundry, driving, shopping, community activity, and yard work   [facility] POTENTIAL: Good  CLINICAL DECISION MAKING: Stable/uncomplicated  EVALUATION COMPLEXITY: Low   GOALS: Goals reviewed with patient? No  SHORT TERM GOALS: Target date: 10/18/22 Pt to be I in her HEP in order to decrease her pain to no greater than a 1/10 Baseline: Goal status: IN PROGRESS  2.  Pt ROM to improve to  90  to allow increased comfort while sitting with her leg bent for  an hour. Baseline:  Goal status: IN PROGRESS  3.  Pt LE strength to be increased 1/2 grade to be able to walk with a cane.   Baseline:  Goal status: IN PROGRESS  LONG TERM GOALS: Target date: 11/08/22  Pt to be I in her HEP in order to increase her flexion to 115 to allow pt to squat down to the ground  Baseline:  Goal status: IN PROGRESS  2.  PT to be able to single leg stance on both LE for 20 " in order to reduce risk of falling  Baseline:  Goal status: IN PROGRESS  3.   Pt LE strength to be increased 1 grade to be able to walk without an assistive device and to be able to be up for 2 hr at a time to return to work. Baseline:  Goal status: IN PROGRESS  4.  PT to be able to go up and down 12 steps in a  reciprocal manner  Baseline:  Goal status: IN PROGRESS      PLAN:  PT FREQUENCY: 2x/week  PT DURATION: 6 weeks  PLANNED INTERVENTIONS: Therapeutic exercises, Therapeutic activity, Balance training, Gait training, Patient/Family education, Self Care, Joint mobilization, and Manual therapy  PLAN FOR NEXT SESSION: Continue to progress ROM, strength and balance.    Becky Sax, LPTA/CLT; CBIS [phone]  Juel Burrow, PTA 10/08/2022, 11:33 AM  10/08/2022, 11:33 AM

## 2022-10-10 ENCOUNTER — Encounter (HOSPITAL_COMMUNITY): Payer: Self-pay

## 2022-10-10 ENCOUNTER — Ambulatory Visit (HOSPITAL_COMMUNITY): Payer: BC Managed Care – PPO

## 2022-10-10 DIAGNOSIS — M6281 Muscle weakness (generalized): Secondary | ICD-10-CM

## 2022-10-10 DIAGNOSIS — M25561 Pain in right knee: Secondary | ICD-10-CM | POA: Diagnosis not present

## 2022-10-10 DIAGNOSIS — M25661 Stiffness of right knee, not elsewhere classified: Secondary | ICD-10-CM

## 2022-10-10 NOTE — Therapy (Signed)
OUTPATIENT PHYSICAL THERAPY LOWER EXTREMITY TREATMENT   Patient Name: Erica Graves MRN: [id number] DOB:1965-06-21, 57 y.o., female Today's Date: 10/10/2022  END OF SESSION: END OF SESSION:   PT End of Session - 10/10/22 1258     Visit Number 4    Number of Visits 45    Date for PT Re-Evaluation 11/08/22    Authorization Type bc [id number]    Progress Note Due on Visit 10    PT Start Time 1133    PT Stop Time 1216    PT Time Calculation (min) 43 min    Activity Tolerance Patient tolerated treatment well;No increased pain    Behavior During Therapy [facility] for tasks assessed/performed                 Past Medical History:  Diagnosis Date   Carpal tunnel syndrome, bilateral    Chronic low back pain    Constipation    Counseling for estrogen replacement therapy 07/07/2014   Depression with anxiety    Dyspareunia 07/07/2014   GAD (generalized anxiety disorder) 04/07/2020   History of kidney stones    Hot flashes 07/07/2014   Hypertension    Menopause 07/07/2014   Moody 07/07/2014   Obesity    Umbilical hernia    Past Surgical History:  Procedure Laterality Date   ABDOMINAL HYSTERECTOMY     BACK SURGERY     removal of cyst subdermal   CARPAL TUNNEL RELEASE Left 09/30/2014   Procedure: CARPAL TUNNEL RELEASE;  Surgeon: Vickki Hearing, MD;  Location: [facility];  Service: Orthopedics;  Laterality: Left;   CARPAL TUNNEL RELEASE Right 09/28/2015   Procedure: CARPAL TUNNEL RELEASE;  Surgeon: Vickki Hearing, MD;  Location: [facility];  Service: Orthopedics;  Laterality: Right;   COLONOSCOPY N/A 06/29/2013   Procedure: COLONOSCOPY;  Surgeon: West Bali, MD;  Location: [facility];  Service: Endoscopy;  Laterality: N/A;  1:30   ESOPHAGOGASTRODUODENOSCOPY (EGD) WITH PROPOFOL N/A 01/04/2022   Procedure: ESOPHAGOGASTRODUODENOSCOPY (EGD) WITH PROPOFOL;  Surgeon: Lanelle Bal, DO;  Location: [facility];  Service: Endoscopy;  Laterality: N/A;  12:30pm, asa 2   KNEE ARTHROSCOPY Right     KNEE ARTHROSCOPY  05/21/2011   Procedure: ARTHROSCOPY KNEE;  Surgeon: Vickki Hearing, MD;  Location: [facility];  Service: Orthopedics;  Laterality: Left;  lateral menisectomy   KNEE CLOSED REDUCTION Left 05/26/2012   Procedure: CLOSED MANIPULATION KNEE;  Surgeon: Vickki Hearing, MD;  Location: [facility];  Service: Orthopedics;  Laterality: Left;   left knee arthroscopy  08/14/2005 Dr. Romeo Apple torn meniscus   LUMBAR SPINE SURGERY     PARTIAL HYSTERECTOMY     TOTAL KNEE ARTHROPLASTY  03/31/2012   Procedure: TOTAL KNEE ARTHROPLASTY;  Surgeon: Vickki Hearing, MD;  Location: [facility];  Service: Orthopedics;  Laterality: Left;  Left Total Knee Arthroplasty   TOTAL KNEE ARTHROPLASTY Right 09/11/2022   Procedure: TOTAL KNEE ARTHROPLASTY;  Surgeon: Vickki Hearing, MD;  Location: [facility];  Service: Orthopedics;  Laterality: Right;   TRIGGER FINGER RELEASE Right 01/24/2017   Procedure: RELEASE TRIGGER FINGER/A-1 PULLEY right thumb;  Surgeon: Vickki Hearing, MD;  Location: [facility];  Service: Orthopedics;  Laterality: Right;   Patient Active Problem List   Diagnosis Date Noted   S/P total knee replacement, right 09/11/22 09/25/2022   Primary localized osteoarthritis of right knee 09/11/2022   Caregiver stress 07/06/2022   Abdominal pain, epigastric 12/11/2021   Chronic idiopathic constipation 11/10/2021  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   Arthritis of carpometacarpal (CMC) joint of right thumb 10/07/2020   Radial styloid tenosynovitis (de quervain) 10/07/2020   Irritable mood 04/07/2020   GAD (generalized anxiety disorder) 04/07/2020   Vaginal itching 06/05/2019   Screening for colorectal cancer 06/05/2019   Screening for cholesterol level 06/05/2019   S/P trigger finger release 01/24/17 02/06/2017   Trigger finger of right thumb    Essential hypertension, benign 11/18/2015   Gastroesophageal reflux disease without esophagitis 05/06/2015   Hot flashes 07/07/2014   Dyspareunia  07/07/2014   Moody 07/07/2014   Menopause 07/07/2014   Counseling for estrogen replacement therapy 07/07/2014   Morbid obesity (HCC) 03/05/2014   Carpal tunnel syndrome of right wrist 12/22/2013   Vitamin D deficiency 10/14/2013   Migraine headache without aura 11/24/2012   Muscle contraction headache 11/24/2012   CTS (carpal tunnel syndrome) 08/14/2012   Stiffness of joint, not elsewhere classified, lower leg 04/22/2012   Weakness of left leg 04/22/2012   Difficulty walking 04/22/2012   S/P total knee replacement, left 03/31/12 04/03/2012   Arthritis of knee, degenerative 03/05/2012   Osteoarthritis of left knee 12/12/2011   S/P arthroscopy of left knee 06/13/2011   Lateral meniscus tear 06/13/2011   Stiffness of knee joint 06/05/2011   Medial meniscus, posterior horn derangement 05/16/2011   DEGENERATIVE JOINT DISEASE, KNEES, BILATERAL 12/19/2009   PATELLO-FEMORAL SYNDROME 12/19/2009   FOOT PAIN, LEFT 07/22/2007   DEPRESSION/ANXIETY 12/27/2006   SHOULDER PAIN, LEFT 12/27/2006   OVERWEIGHT 11/12/2006   CONSTIPATION 11/12/2006   Lumbago 11/12/2006    PCP: Lilyan Punt   REFERRING PROVIDER: Vickki Hearing, MD  REFERRING DIAG: M17.11 (ICD-10-CM) - Unilateral primary osteoarthritis, right knee  THERAPY DIAG:  Right knee pain Right knee stiffness Muscle weakness Difficulty in walking.  Rationale for Evaluation and Treatment: Rehabilitation  ONSET DATE: 09/11/2022    SUBJECTIVE STATEMENT: 10/10/22:  Knee is feeling stiff today, no reports of pain.  Has been compliance with HEP.  Ambulated without AD into dept today.    Eval:  PT had a TKR on 09/11/22.  She is currently walking with crutches.   She had her Left done several years ago. Her main concern is her strength stiffness.  She can sit for an hour.  Stand for 30 and/or walk for 30 minutes.   PERTINENT HISTORY: Lt TKR, OA, back pain  PAIN:  Are you having pain? Yes: NPRS scale: 0/10; max of 3  Pain location: Rt  knee  Pain description: throb Aggravating factors: trying to bend the knee  Relieving factors: CPM  PRECAUTIONS: None   WEIGHT BEARING RESTRICTIONS: WBAT  FALLS:  Has patient fallen in last 6 months? No  LIVING ENVIRONMENT: Lives with: lives with their family Lives in: House/apartment Stairs: Yes: External: 4 steps; on right going up Has following equipment at home: Single point cane, Walker - 2 wheeled, and Crutches  OCCUPATIONOffice manager up and down   PLOF: Independent  PATIENT GOALS: Walk without assistive device, better strength   NEXT MD VISIT: 10/18/22  OBJECTIVE:    PATIENT SURVEYS:  FOTO 39  COGNITION: Overall cognitive status: Within functional limits for tasks assessed     SENSATION: WFL  EDEMA: WNL for surgery   LOWER EXTREMITY ROM:  Active ROM Right eval Left eval 10/02/22 AROM: 10/08/22: 10/10/22:  Hip flexion       Hip extension       Hip abduction       Hip adduction  Hip internal rotation       Hip external rotation       Knee flexion 50  82 88 90  Knee extension Lacking 10 degrees   8 6 4   Ankle dorsiflexion       Ankle plantarflexion       Ankle inversion       Ankle eversion        (Blank rows = not tested)  LOWER EXTREMITY MMT:  MMT Right eval Left eval  Hip flexion 2   Hip extension    Hip abduction 3+   Hip adduction    Hip internal rotation    Hip external rotation    Knee flexion 2+   Knee extension 2+   Ankle dorsiflexion 5   Ankle plantarflexion    Ankle inversion    Ankle eversion     (Blank rows = not tested)   FUNCTIONAL TESTS:  30 seconds chair stand test: unable to come sit to stand without use of UE assist.  2 minute walk test: 226 ft with crutches  Single leg stance: RT: unable    TODAY'S TREATMENT:                                                                                                                              DATE:  10/10/22: Bike rocking seat 13 x 5' Standing: -Knee drive  on 2nd step (40JW) 10x 10" -TKE 10x 5" with RTB Supine: Manual decongestive techniques for edema control with LE elevated Prone: -Hamstring curls 10x 5" holds -TKE 10x 5" -Quad sets 3x 30" with rope -Contract/relax for flexion 5x 10" holds Supine: AROM 4-90 degrees   10/08/22: Bike rocking seat 13 Standing: -Knee drive on 11BJ step 47W 10" -Heel raises 10x 5" -Toe raises decline slope 10x 5" -TKE 10x 5" with RTB -Sit to stand 10 no HHA -Gait no AD good mechanics  -Slant board 3x 30" -wall squat 5x 5" Prone: -hamstring curl 10x 5" -quad st 3x 30" Supine: -heel slide 10x  -quad sets -AROM 6-88 degrees -Hamstring stretch 3x 30" with rope Seated: -LAQ 10x  10/02/22: Reviewed goals Educated importance of HEP compliance for maximal benefits Supine:   -quad sets 10x 5"  - heel slide with strap 10x 5" holds at end range  -AROM 8-82 degrees  -SAQ 10x 5"  -Bridge 10x5  -SLR with quad set prior range 10x Sidelying:  -abduction Manual in supine with LE elevated:  -decongestive massage  -Scar tissue massage Ambulate with 1 crutch x 258ft with good mechanics Bike rocking seat 13  09/27/22 Eval - Seated Long Arc Quad   5- 3-5 seconds  hold - Supine Quadricep Sets  -10 reps - 5"  hold - Supine Heel Slide   5 reps - 3-5" hold - Supine Heel Slide with Strap  - 5 reps - 3-5" hold Side lying Rt hip abduction x 10 Prone knee flexion x 10  PATIENT EDUCATION:  Education details: HEP Person educated: Patient Education method: Programmer, multimedia, Verbal cues, and Handouts Education comprehension: returned demonstration  HOME EXERCISE PROGRAM: Access Code: [email or web address] URL: [email or web address] Date: 09/27/2022 Prepared by: Virgina Organ  Exercises - Seated Long Arc Quad  - 6 x daily - 7 x weekly - 1 sets - 5-10 reps - 3-5 seconds  hold - Supine Quadricep Sets  - 3 x daily - 7 x weekly - 1 sets - 10 reps - 5"  hold - Supine Heel Slide  - 3 x daily - 7 x weekly -  1 sets - 5 reps - 3-5" hold - Supine Heel Slide with Strap  - 3 x daily - 7 x weekly - 1 sets - 5 reps - 3-5" hold Side lying abduction x 10 ASSESSMENT:  CLINICAL IMPRESSION: 10/10/22:  Session focus with knee mobility and proximal strengthening.  Noted edema present ankle and knee.  Manual decongestive techniques complete for edema control prior ROM based exercises.  Added prone TKE and contract relax.  AROM 4-90 degrees.  Pt tolerated session well with no reports of pain.  Reports she has added knee drives to HEP.    Eval: Patient is a 58 y.o. female who was seen today for physical therapy evaluation and treatment for evaluation and treatment following a RT TKR.  Evaluation demonstrates decreased ROM, decreased activity tolerance, decreased ROM, decreased balance, increased edema and increased pain.  Mr. Beverley Fiedler will benefit from skilled PT to address these issues and progress her to her maximal functioning level.   OBJECTIVE IMPAIRMENTS: decreased activity tolerance, decreased balance, difficulty walking, decreased ROM, decreased strength, increased edema, and pain.   ACTIVITY LIMITATIONS: carrying, lifting, bending, sitting, standing, squatting, sleeping, stairs, and locomotion level  PARTICIPATION LIMITATIONS: cleaning, laundry, driving, shopping, community activity, and yard work   [facility] POTENTIAL: Good  CLINICAL DECISION MAKING: Stable/uncomplicated  EVALUATION COMPLEXITY: Low   GOALS: Goals reviewed with patient? No  SHORT TERM GOALS: Target date: 10/18/22 Pt to be I in her HEP in order to decrease her pain to no greater than a 1/10 Baseline: Goal status: IN PROGRESS  2.  Pt ROM to improve to  90  to allow increased comfort while sitting with her leg bent for  an hour. Baseline:  Goal status: IN PROGRESS  3.  Pt LE strength to be increased 1/2 grade to be able to walk with a cane.   Baseline:  Goal status: IN PROGRESS  LONG TERM GOALS: Target date: 11/08/22  Pt to be I  in her HEP in order to increase her flexion to 115 to allow pt to squat down to the ground  Baseline:  Goal status: IN PROGRESS  2.  PT to be able to single leg stance on both LE for 20 " in order to reduce risk of falling  Baseline:  Goal status: IN PROGRESS  3.   Pt LE strength to be increased 1 grade to be able to walk without an assistive device and to be able to be up for 2 hr at a time to return to work. Baseline:  Goal status: IN PROGRESS  4.  PT to be able to go up and down 12 steps in a reciprocal manner  Baseline:  Goal status: IN PROGRESS      PLAN:  PT FREQUENCY: 2x/week  PT DURATION: 6 weeks  PLANNED INTERVENTIONS: Therapeutic exercises, Therapeutic activity, Balance training, Gait training, Patient/Family education, Self Care, Joint mobilization, and Manual therapy  PLAN FOR  NEXT SESSION: Continue to progress ROM, strength and balance.    Becky Sax, LPTA/CLT; CBIS [phone]  Juel Burrow, PTA 10/10/2022, 1:01 PM  10/10/2022, 1:01 PM

## 2022-10-12 ENCOUNTER — Encounter (HOSPITAL_COMMUNITY): Payer: Self-pay

## 2022-10-12 ENCOUNTER — Ambulatory Visit (HOSPITAL_COMMUNITY): Payer: BC Managed Care – PPO

## 2022-10-12 DIAGNOSIS — M25561 Pain in right knee: Secondary | ICD-10-CM

## 2022-10-12 DIAGNOSIS — M6281 Muscle weakness (generalized): Secondary | ICD-10-CM

## 2022-10-12 DIAGNOSIS — M25661 Stiffness of right knee, not elsewhere classified: Secondary | ICD-10-CM

## 2022-10-12 NOTE — Therapy (Signed)
OUTPATIENT PHYSICAL THERAPY LOWER EXTREMITY TREATMENT   Patient Name: Erica Graves MRN: [id number] DOB:Dec 07, 1965, 57 y.o., female Today's Date: 10/12/2022  END OF SESSION: END OF SESSION:   PT End of Session - 10/12/22 1132     Visit Number 5    Number of Visits 45    Date for PT Re-Evaluation 11/08/22    Authorization Type bc [id number]    Progress Note Due on Visit 10    PT Start Time 1042    PT Stop Time 1128    PT Time Calculation (min) 46 min    Activity Tolerance Patient tolerated treatment well;No increased pain    Behavior During Therapy [facility] for tasks assessed/performed                  Past Medical History:  Diagnosis Date   Carpal tunnel syndrome, bilateral    Chronic low back pain    Constipation    Counseling for estrogen replacement therapy 07/07/2014   Depression with anxiety    Dyspareunia 07/07/2014   GAD (generalized anxiety disorder) 04/07/2020   History of kidney stones    Hot flashes 07/07/2014   Hypertension    Menopause 07/07/2014   Moody 07/07/2014   Obesity    Umbilical hernia    Past Surgical History:  Procedure Laterality Date   ABDOMINAL HYSTERECTOMY     BACK SURGERY     removal of cyst subdermal   CARPAL TUNNEL RELEASE Left 09/30/2014   Procedure: CARPAL TUNNEL RELEASE;  Surgeon: Vickki Hearing, MD;  Location: [facility];  Service: Orthopedics;  Laterality: Left;   CARPAL TUNNEL RELEASE Right 09/28/2015   Procedure: CARPAL TUNNEL RELEASE;  Surgeon: Vickki Hearing, MD;  Location: [facility];  Service: Orthopedics;  Laterality: Right;   COLONOSCOPY N/A 06/29/2013   Procedure: COLONOSCOPY;  Surgeon: West Bali, MD;  Location: [facility];  Service: Endoscopy;  Laterality: N/A;  1:30   ESOPHAGOGASTRODUODENOSCOPY (EGD) WITH PROPOFOL N/A 01/04/2022   Procedure: ESOPHAGOGASTRODUODENOSCOPY (EGD) WITH PROPOFOL;  Surgeon: Lanelle Bal, DO;  [facility]: [facility];  Service: Endoscopy;  Laterality: N/A;  12:30pm, asa 2   KNEE ARTHROSCOPY  Right    KNEE ARTHROSCOPY  05/21/2011   Procedure: ARTHROSCOPY KNEE;  Surgeon: Vickki Hearing, MD;  Location: [facility];  Service: Orthopedics;  Laterality: Left;  lateral menisectomy   KNEE CLOSED REDUCTION Left 05/26/2012   Procedure: CLOSED MANIPULATION KNEE;  Surgeon: Vickki Hearing, MD;  Location: [facility];  Service: Orthopedics;  Laterality: Left;   left knee arthroscopy  08/14/2005 Dr. Romeo Apple torn meniscus   LUMBAR SPINE SURGERY     PARTIAL HYSTERECTOMY     TOTAL KNEE ARTHROPLASTY  03/31/2012   Procedure: TOTAL KNEE ARTHROPLASTY;  Surgeon: Vickki Hearing, MD;  Location: [facility];  Service: Orthopedics;  Laterality: Left;  Left Total Knee Arthroplasty   TOTAL KNEE ARTHROPLASTY Right 09/11/2022   Procedure: TOTAL KNEE ARTHROPLASTY;  Surgeon: Vickki Hearing, MD;  Location: [facility];  Service: Orthopedics;  Laterality: Right;   TRIGGER FINGER RELEASE Right 01/24/2017   Procedure: RELEASE TRIGGER FINGER/A-1 PULLEY right thumb;  Surgeon: Vickki Hearing, MD;  Location: [facility];  Service: Orthopedics;  Laterality: Right;   Patient Active Problem List   Diagnosis Date Noted   S/P total knee replacement, right 09/11/22 09/25/2022   Primary localized osteoarthritis of right knee 09/11/2022   Caregiver stress 07/06/2022   Abdominal pain, epigastric 12/11/2021   Chronic idiopathic constipation 11/10/2021  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   Arthritis of carpometacarpal (CMC) joint of right thumb 10/07/2020   Radial styloid tenosynovitis (de quervain) 10/07/2020   Irritable mood 04/07/2020   GAD (generalized anxiety disorder) 04/07/2020   Vaginal itching 06/05/2019   Screening for colorectal cancer 06/05/2019   Screening for cholesterol level 06/05/2019   S/P trigger finger release 01/24/17 02/06/2017   Trigger finger of right thumb    Essential hypertension, benign 11/18/2015   Gastroesophageal reflux disease without esophagitis 05/06/2015   Hot flashes 07/07/2014   Dyspareunia  07/07/2014   Moody 07/07/2014   Menopause 07/07/2014   Counseling for estrogen replacement therapy 07/07/2014   Morbid obesity (HCC) 03/05/2014   Carpal tunnel syndrome of right wrist 12/22/2013   Vitamin D deficiency 10/14/2013   Migraine headache without aura 11/24/2012   Muscle contraction headache 11/24/2012   CTS (carpal tunnel syndrome) 08/14/2012   Stiffness of joint, not elsewhere classified, lower leg 04/22/2012   Weakness of left leg 04/22/2012   Difficulty walking 04/22/2012   S/P total knee replacement, left 03/31/12 04/03/2012   Arthritis of knee, degenerative 03/05/2012   Osteoarthritis of left knee 12/12/2011   S/P arthroscopy of left knee 06/13/2011   Lateral meniscus tear 06/13/2011   Stiffness of knee joint 06/05/2011   Medial meniscus, posterior horn derangement 05/16/2011   DEGENERATIVE JOINT DISEASE, KNEES, BILATERAL 12/19/2009   PATELLO-FEMORAL SYNDROME 12/19/2009   FOOT PAIN, LEFT 07/22/2007   DEPRESSION/ANXIETY 12/27/2006   SHOULDER PAIN, LEFT 12/27/2006   OVERWEIGHT 11/12/2006   CONSTIPATION 11/12/2006   Lumbago 11/12/2006    PCP: Lilyan Punt   REFERRING PROVIDER: Vickki Hearing, MD  REFERRING DIAG: M17.11 (ICD-10-CM) - Unilateral primary osteoarthritis, right knee  THERAPY DIAG:  Right knee pain Right knee stiffness Muscle weakness Difficulty in walking.  Rationale for Evaluation and Treatment: Rehabilitation  ONSET DATE: 09/11/2022    SUBJECTIVE STATEMENT: 10/12/22:  Pt arrived ambulating without AD and wearing compression garment today.  Stated knee doesn't feel as stiff today and garments help with swelling.  Husband helped with contract/relax at home yesterday.  Eval:  PT had a TKR on 09/11/22.  She is currently walking with crutches.   She had her Left done several years ago. Her main concern is her strength stiffness.  She can sit for an hour.  Stand for 30 and/or walk for 30 minutes.   PERTINENT HISTORY: Lt TKR, OA, back pain   PAIN:  Are you having pain? Yes: NPRS scale: 0/10; max of 3  Pain location: Rt knee  Pain description: throb Aggravating factors: trying to bend the knee  Relieving factors: CPM  PRECAUTIONS: None   WEIGHT BEARING RESTRICTIONS: WBAT  FALLS:  Has patient fallen in last 6 months? No  LIVING ENVIRONMENT: Lives with: lives with their family Lives in: House/apartment Stairs: Yes: External: 4 steps; on right going up Has following equipment at home: Single point cane, Walker - 2 wheeled, and Crutches  OCCUPATIONOffice manager up and down   PLOF: Independent  PATIENT GOALS: Walk without assistive device, better strength   NEXT MD VISIT: 10/18/22  OBJECTIVE:    PATIENT SURVEYS:  FOTO 39  COGNITION: Overall cognitive status: Within functional limits for tasks assessed     SENSATION: WFL  EDEMA: WNL for surgery   LOWER EXTREMITY ROM:  Active ROM Right eval Left eval 10/02/22 AROM: 10/08/22: 10/10/22:  Hip flexion       Hip extension       Hip abduction  Hip adduction       Hip internal rotation       Hip external rotation       Knee flexion 50  82 88 90  Knee extension Lacking 10 degrees   8 6 4   Ankle dorsiflexion       Ankle plantarflexion       Ankle inversion       Ankle eversion        (Blank rows = not tested)  LOWER EXTREMITY MMT:  MMT Right eval Left eval  Hip flexion 2   Hip extension    Hip abduction 3+   Hip adduction    Hip internal rotation    Hip external rotation    Knee flexion 2+   Knee extension 2+   Ankle dorsiflexion 5   Ankle plantarflexion    Ankle inversion    Ankle eversion     (Blank rows = not tested)   FUNCTIONAL TESTS:  30 seconds chair stand test: unable to come sit to stand without use of UE assist.  2 minute walk test: 226 ft with crutches  Single leg stance: RT: unable    TODAY'S TREATMENT:                                                                                                                               DATE:  10/12/22 Bike rocking seat 12 x 5' Standing: -Knee drive on 95MW step 41L 10" -Hamstring curls 10x 5" -TKE GTB 10x5" -wall slide 10x 5"  Prone: -Hamstring curls 10x 5" holds -TKE 10x 5" -Quad stretch 3x 30" with rope -Contract/relax for flexion 5x 10" holds  Seated -PROM 3x 30" for flexion  Supine: -PROM 3x 30" for flexion AROM 3-90 degrees   10/10/22: Bike rocking seat 13 x 5' Standing: -Knee drive on 2nd step (24MW) 10U 10" -TKE 10x 5" with RTB Supine: Manual decongestive techniques for edema control with LE elevated Prone: -Hamstring curls 10x 5" holds -TKE 10x 5" -Quad st 3x 30" with rope -Contract/relax for flexion 5x 10" holds Supine: AROM 4-90 degrees   10/08/22: Bike rocking seat 13 Standing: -Knee drive on 72ZD step 66Y 10" -Heel raises 10x 5" -Toe raises decline slope 10x 5" -TKE 10x 5" with RTB -Sit to stand 10 no HHA -Gait no AD good mechanics  -Slant board 3x 30" -wall squat 5x 5" Prone: -hamstring curl 10x 5" -quad st 3x 30" Supine: -heel slide 10x  -quad sets -AROM 6-88 degrees -Hamstring stretch 3x 30" with rope Seated: -LAQ 10x  10/02/22: Reviewed goals Educated importance of HEP compliance for maximal benefits Supine:   -quad sets 10x 5"  - heel slide with strap 10x 5" holds at end range  -AROM 8-82 degrees  -SAQ 10x 5"  -Bridge 10x5  -SLR with quad set prior range 10x Sidelying:  -abduction Manual in supine with LE elevated:  -decongestive massage  -Scar tissue massage Ambulate with  1 crutch x 244ft with good mechanics Bike rocking seat 13  09/27/22 Eval - Seated Long Arc Quad   5- 3-5 seconds  hold - Supine Quadricep Sets  -10 reps - 5"  hold - Supine Heel Slide   5 reps - 3-5" hold - Supine Heel Slide with Strap  - 5 reps - 3-5" hold Side lying Rt hip abduction x 10 Prone knee flexion x 10    PATIENT EDUCATION:  Education details: HEP Person educated: Patient Education method: Explanation,  Verbal cues, and Handouts Education comprehension: returned demonstration  HOME EXERCISE PROGRAM: Access Code: [email or web address] URL: [email or web address] Date: 09/27/2022 Prepared by: Virgina Organ  Exercises - Seated Long Arc Quad  - 6 x daily - 7 x weekly - 1 sets - 5-10 reps - 3-5 seconds  hold - Supine Quadricep Sets  - 3 x daily - 7 x weekly - 1 sets - 10 reps - 5"  hold - Supine Heel Slide  - 3 x daily - 7 x weekly - 1 sets - 5 reps - 3-5" hold - Supine Heel Slide with Strap  - 3 x daily - 7 x weekly - 1 sets - 5 reps - 3-5" hold Side lying abduction x 10  Access Code: [email or web address] URL: [email or web address] Date: 10/12/2022 Prepared by: Becky Sax  Exercises - Seated Long Arc Quad  - 6 x daily - 7 x weekly - 1 sets - 5-10 reps - 3-5 seconds  hold - Supine Quadricep Sets  - 3 x daily - 7 x weekly - 1 sets - 10 reps - 5"  hold - Supine Heel Slide  - 3 x daily - 7 x weekly - 1 sets - 5 reps - 3-5" hold - Supine Heel Slide with Strap  - 3 x daily - 7 x weekly - 1 sets - 5 reps - 3-5" hold - Sidelying Hip Abduction  - 3 x daily - 7 x weekly - 1 sets - 10 reps - 3-5" hold - Supine Bridge  - 3 x daily - 7 x weekly - 1 sets - 10 reps - 5" hold - Supine Knee Extension Strengthening  - 3 x daily - 7 x weekly - 1 sets - 10 reps - 5" hold - Straight Leg Raise  - 2 x daily - 7 x weekly - 1 sets - 10 reps ASSESSMENT:  CLINICAL IMPRESSION: 10/12/22:  Continued session focus with knee mobility and proximal strengthening.  Pt arrived wearing ocmpression garments with noted decreased edema ankle and knee.  Continued with stretches and contract/relax with additional PROM for increased hold time.  Pt tolerated well to sessoin with no reports of increased pain.  AROM 3-90 degrees.  Pt with hx of manipulation with Lt knee, plan on aggressive PROM in future apts.    Eval: Patient is a 57 y.o. female who was seen today for physical therapy evaluation and treatment for  evaluation and treatment following a RT TKR.  Evaluation demonstrates decreased ROM, decreased activity tolerance, decreased ROM, decreased balance, increased edema and increased pain.  Mr. Beverley Fiedler will benefit from skilled PT to address these issues and progress her to her maximal functioning level.   OBJECTIVE IMPAIRMENTS: decreased activity tolerance, decreased balance, difficulty walking, decreased ROM, decreased strength, increased edema, and pain.   ACTIVITY LIMITATIONS: carrying, lifting, bending, sitting, standing, squatting, sleeping, stairs, and locomotion level  PARTICIPATION LIMITATIONS: cleaning, laundry, driving, shopping, community activity, and yard work   [facility] POTENTIAL: Good  CLINICAL DECISION MAKING:  Stable/uncomplicated  EVALUATION COMPLEXITY: Low   GOALS: Goals reviewed with patient? No  SHORT TERM GOALS: Target date: 10/18/22 Pt to be I in her HEP in order to decrease her pain to no greater than a 1/10 Baseline: Goal status: IN PROGRESS  2.  Pt ROM to improve to  90  to allow increased comfort while sitting with her leg bent for  an hour. Baseline:  Goal status: IN PROGRESS  3.  Pt LE strength to be increased 1/2 grade to be able to walk with a cane.   Baseline:  Goal status: IN PROGRESS  LONG TERM GOALS: Target date: 11/08/22  Pt to be I in her HEP in order to increase her flexion to 115 to allow pt to squat down to the ground  Baseline:  Goal status: IN PROGRESS  2.  PT to be able to single leg stance on both LE for 20 " in order to reduce risk of falling  Baseline:  Goal status: IN PROGRESS  3.   Pt LE strength to be increased 1 grade to be able to walk without an assistive device and to be able to be up for 2 hr at a time to return to work. Baseline:  Goal status: IN PROGRESS  4.  PT to be able to go up and down 12 steps in a reciprocal manner  Baseline:  Goal status: IN PROGRESS      PLAN:  PT FREQUENCY: 2x/week  PT DURATION: 6  weeks  PLANNED INTERVENTIONS: Therapeutic exercises, Therapeutic activity, Balance training, Gait training, Patient/Family education, Self Care, Joint mobilization, and Manual therapy  PLAN FOR NEXT SESSION: Continue to progress ROM, strength and balance.  Increase stretch time to 1' long and more aggressive PROM for knee flexion.  Becky Sax, LPTA/CLT; CBIS [phone]  Juel Burrow, PTA 10/12/2022, 11:34 AM  10/12/2022, 11:34 AM

## 2022-10-15 ENCOUNTER — Encounter (HOSPITAL_COMMUNITY): Payer: Self-pay

## 2022-10-15 ENCOUNTER — Ambulatory Visit (HOSPITAL_COMMUNITY): Payer: BC Managed Care – PPO

## 2022-10-15 DIAGNOSIS — M6281 Muscle weakness (generalized): Secondary | ICD-10-CM

## 2022-10-15 DIAGNOSIS — M25661 Stiffness of right knee, not elsewhere classified: Secondary | ICD-10-CM

## 2022-10-15 DIAGNOSIS — M25561 Pain in right knee: Secondary | ICD-10-CM | POA: Diagnosis not present

## 2022-10-15 NOTE — Therapy (Signed)
OUTPATIENT PHYSICAL THERAPY LOWER EXTREMITY TREATMENT   Patient Name: Erica Graves MRN: [id number] DOB:30-Jul-1965, 57 y.o., female Today's Date: 10/15/2022  END OF SESSION: END OF SESSION:   PT End of Session - 10/15/22 1145     Visit Number 6    Number of Visits 45    Date for PT Re-Evaluation 11/08/22    Authorization Type bc [id number]    Progress Note Due on Visit 10    PT Start Time 1132    PT Stop Time 1221    PT Time Calculation (min) 49 min    Activity Tolerance Patient tolerated treatment well;No increased pain    Behavior During Therapy [facility] for tasks assessed/performed                  Past Medical History:  Diagnosis Date   Carpal tunnel syndrome, bilateral    Chronic low back pain    Constipation    Counseling for estrogen replacement therapy 07/07/2014   Depression with anxiety    Dyspareunia 07/07/2014   GAD (generalized anxiety disorder) 04/07/2020   History of kidney stones    Hot flashes 07/07/2014   Hypertension    Menopause 07/07/2014   Moody 07/07/2014   Obesity    Umbilical hernia    Past Surgical History:  Procedure Laterality Date   ABDOMINAL HYSTERECTOMY     BACK SURGERY     removal of cyst subdermal   CARPAL TUNNEL RELEASE Left 09/30/2014   Procedure: CARPAL TUNNEL RELEASE;  Surgeon: Vickki Hearing, MD;  Location: [facility];  Service: Orthopedics;  Laterality: Left;   CARPAL TUNNEL RELEASE Right 09/28/2015   Procedure: CARPAL TUNNEL RELEASE;  Surgeon: Vickki Hearing, MD;  Location: [facility];  Service: Orthopedics;  Laterality: Right;   COLONOSCOPY N/A 06/29/2013   Procedure: COLONOSCOPY;  Surgeon: West Bali, MD;  Location: [facility];  Service: Endoscopy;  Laterality: N/A;  1:30   ESOPHAGOGASTRODUODENOSCOPY (EGD) WITH PROPOFOL N/A 01/04/2022   Procedure: ESOPHAGOGASTRODUODENOSCOPY (EGD) WITH PROPOFOL;  Surgeon: Lanelle Bal, DO;  Location: [facility];  Service: Endoscopy;  Laterality: N/A;  12:30pm, asa 2   KNEE ARTHROSCOPY  Right    KNEE ARTHROSCOPY  05/21/2011   Procedure: ARTHROSCOPY KNEE;  Surgeon: Vickki Hearing, MD;  Location: [facility];  Service: Orthopedics;  Laterality: Left;  lateral menisectomy   KNEE CLOSED REDUCTION Left 05/26/2012   Procedure: CLOSED MANIPULATION KNEE;  Surgeon: Vickki Hearing, MD;  Location: [facility];  Service: Orthopedics;  Laterality: Left;   left knee arthroscopy  08/14/2005 Dr. Romeo Apple torn meniscus   LUMBAR SPINE SURGERY     PARTIAL HYSTERECTOMY     TOTAL KNEE ARTHROPLASTY  03/31/2012   Procedure: TOTAL KNEE ARTHROPLASTY;  Surgeon: Vickki Hearing, MD;  Location: [facility];  Service: Orthopedics;  Laterality: Left;  Left Total Knee Arthroplasty   TOTAL KNEE ARTHROPLASTY Right 09/11/2022   Procedure: TOTAL KNEE ARTHROPLASTY;  Surgeon: Vickki Hearing, MD;  Location: [facility];  Service: Orthopedics;  Laterality: Right;   TRIGGER FINGER RELEASE Right 01/24/2017   Procedure: RELEASE TRIGGER FINGER/A-1 PULLEY right thumb;  Surgeon: Vickki Hearing, MD;  Location: [facility];  Service: Orthopedics;  Laterality: Right;   Patient Active Problem List   Diagnosis Date Noted   S/P total knee replacement, right 09/11/22 09/25/2022   Primary localized osteoarthritis of right knee 09/11/2022   Caregiver stress 07/06/2022   Abdominal pain, epigastric 12/11/2021   Chronic idiopathic constipation 11/10/2021  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   Arthritis of carpometacarpal (CMC) joint of right thumb 10/07/2020   Radial styloid tenosynovitis (de quervain) 10/07/2020   Irritable mood 04/07/2020   GAD (generalized anxiety disorder) 04/07/2020   Vaginal itching 06/05/2019   Screening for colorectal cancer 06/05/2019   Screening for cholesterol level 06/05/2019   S/P trigger finger release 01/24/17 02/06/2017   Trigger finger of right thumb    Essential hypertension, benign 11/18/2015   Gastroesophageal reflux disease without esophagitis 05/06/2015   Hot flashes 07/07/2014   Dyspareunia  07/07/2014   Moody 07/07/2014   Menopause 07/07/2014   Counseling for estrogen replacement therapy 07/07/2014   Morbid obesity (HCC) 03/05/2014   Carpal tunnel syndrome of right wrist 12/22/2013   Vitamin D deficiency 10/14/2013   Migraine headache without aura 11/24/2012   Muscle contraction headache 11/24/2012   CTS (carpal tunnel syndrome) 08/14/2012   Stiffness of joint, not elsewhere classified, lower leg 04/22/2012   Weakness of left leg 04/22/2012   Difficulty walking 04/22/2012   S/P total knee replacement, left 03/31/12 04/03/2012   Arthritis of knee, degenerative 03/05/2012   Osteoarthritis of left knee 12/12/2011   S/P arthroscopy of left knee 06/13/2011   Lateral meniscus tear 06/13/2011   Stiffness of knee joint 06/05/2011   Medial meniscus, posterior horn derangement 05/16/2011   DEGENERATIVE JOINT DISEASE, KNEES, BILATERAL 12/19/2009   PATELLO-FEMORAL SYNDROME 12/19/2009   FOOT PAIN, LEFT 07/22/2007   DEPRESSION/ANXIETY 12/27/2006   SHOULDER PAIN, LEFT 12/27/2006   OVERWEIGHT 11/12/2006   CONSTIPATION 11/12/2006   Lumbago 11/12/2006    PCP: Lilyan Punt   REFERRING PROVIDER: Vickki Hearing, MD  REFERRING DIAG: M17.11 (ICD-10-CM) - Unilateral primary osteoarthritis, right knee  THERAPY DIAG:  Right knee pain Right knee stiffness Muscle weakness Difficulty in walking.  Rationale for Evaluation and Treatment: Rehabilitation  ONSET DATE: 09/11/2022    SUBJECTIVE STATEMENT: 8/19/924:  Pt arrived wearing compression garments.  Stated knee is tight today.  Eval:  PT had a TKR on 09/11/22.  She is currently walking with crutches.   She had her Left done several years ago. Her main concern is her strength stiffness.  She can sit for an hour.  Stand for 30 and/or walk for 30 minutes.   PERTINENT HISTORY: Lt TKR, OA, back pain  PAIN:  Are you having pain? Yes: NPRS scale: 0/10; max of 3  Pain location: Rt knee  Pain description: throb Aggravating  factors: trying to bend the knee  Relieving factors: CPM  PRECAUTIONS: None   WEIGHT BEARING RESTRICTIONS: WBAT  FALLS:  Has patient fallen in last 6 months? No  LIVING ENVIRONMENT: Lives with: lives with their family Lives in: House/apartment Stairs: Yes: External: 4 steps; on right going up Has following equipment at home: Single point cane, Environmental consultant - 2 wheeled, and Crutches  OCCUPATIONOffice manager up and down   PLOF: Independent  PATIENT GOALS: Walk without assistive device, better strength   NEXT MD VISIT: 10/18/22  OBJECTIVE:    PATIENT SURVEYS:  FOTO 39  COGNITION: Overall cognitive status: Within functional limits for tasks assessed     SENSATION: WFL  EDEMA: WNL for surgery   LOWER EXTREMITY ROM:  Active ROM Right eval Left eval 10/02/22 AROM: 10/08/22: 10/10/22:  Hip flexion       Hip extension       Hip abduction       Hip adduction       Hip internal rotation       Hip  external rotation       Knee flexion 50  82 88 90  Knee extension Lacking 10 degrees   8 6 4   Ankle dorsiflexion       Ankle plantarflexion       Ankle inversion       Ankle eversion        (Blank rows = not tested)  LOWER EXTREMITY MMT:  MMT Right eval Left eval  Hip flexion 2   Hip extension    Hip abduction 3+   Hip adduction    Hip internal rotation    Hip external rotation    Knee flexion 2+   Knee extension 2+   Ankle dorsiflexion 5   Ankle plantarflexion    Ankle inversion    Ankle eversion     (Blank rows = not tested)   FUNCTIONAL TESTS:  30 seconds chair stand test: unable to come sit to stand without use of UE assist.  2 minute walk test: 226 ft with crutches  Single leg stance: RT: unable    TODAY'S TREATMENT:                                                                                                                              DATE:  10/15/22: Bike rocking seat 12 x 5' Standing: -Knee drive on 40JW step 11B 10" -Hamstring curls 10x  5"  Prone: -Hamstring curls 10x 5" holds -contract/relax 5x 30" holds -quad stretch 3 x 1 min with rope  Seated: -Contract/relax 5x 1' holds -Distraction with PROM 3x 30"  Supine: -Heel slides on therball 10x with long hold end range -SKTC with towel under knee 3x 1' -PROM 3x 1' -AROM 94 degrees flexion 10/12/22 Bike rocking seat 12 x 5' Standing: -Knee drive on 14NW step 29F 10" -Hamstring curls 10x 5" -TKE GTB 10x5" -wall slide 10x 5"  Prone: -Hamstring curls 10x 5" holds -TKE 10x 5" -Quad stretch 3x 30" with rope -Contract/relax for flexion 5x 10" holds  Seated -PROM 3x 30" for flexion  Supine: -PROM 3x 30" for flexion AROM 3-90 degrees   10/10/22: Bike rocking seat 13 x 5' Standing: -Knee drive on 2nd step (62ZH) 08M 10" -TKE 10x 5" with RTB Supine: Manual decongestive techniques for edema control with LE elevated Prone: -Hamstring curls 10x 5" holds -TKE 10x 5" -Quad st 3x 30" with rope -Contract/relax for flexion 5x 10" holds Supine: AROM 4-90 degrees   10/08/22: Bike rocking seat 13 Standing: -Knee drive on 57QI step 69G 10" -Heel raises 10x 5" -Toe raises decline slope 10x 5" -TKE 10x 5" with RTB -Sit to stand 10 no HHA -Gait no AD good mechanics  -Slant board 3x 30" -wall squat 5x 5" Prone: -hamstring curl 10x 5" -quad st 3x 30" Supine: -heel slide 10x  -quad sets -AROM 6-88 degrees -Hamstring stretch 3x 30" with rope Seated: -LAQ 10x  10/02/22: Reviewed goals Educated importance of HEP compliance for maximal  benefits Supine:   -quad sets 10x 5"  - heel slide with strap 10x 5" holds at end range  -AROM 8-82 degrees  -SAQ 10x 5"  -Bridge 10x5  -SLR with quad set prior range 10x Sidelying:  -abduction Manual in supine with LE elevated:  -decongestive massage  -Scar tissue massage Ambulate with 1 crutch x 237ft with good mechanics Bike rocking seat 13  09/27/22 Eval - Seated Long Arc Quad   5- 3-5 seconds  hold - Supine  Quadricep Sets  -10 reps - 5"  hold - Supine Heel Slide   5 reps - 3-5" hold - Supine Heel Slide with Strap  - 5 reps - 3-5" hold Side lying Rt hip abduction x 10 Prone knee flexion x 10    PATIENT EDUCATION:  Education details: HEP Person educated: Patient Education method: Explanation, Verbal cues, and Handouts Education comprehension: returned demonstration  HOME EXERCISE PROGRAM: Access Code: [email or web address] URL: [email or web address] Date: 09/27/2022 Prepared by: Virgina Organ  Exercises - Seated Long Arc Quad  - 6 x daily - 7 x weekly - 1 sets - 5-10 reps - 3-5 seconds  hold - Supine Quadricep Sets  - 3 x daily - 7 x weekly - 1 sets - 10 reps - 5"  hold - Supine Heel Slide  - 3 x daily - 7 x weekly - 1 sets - 5 reps - 3-5" hold - Supine Heel Slide with Strap  - 3 x daily - 7 x weekly - 1 sets - 5 reps - 3-5" hold Side lying abduction x 10  Access Code: [email or web address] URL: [email or web address] Date: 10/12/2022 Prepared by: Becky Sax  Exercises - Seated Long Arc Quad  - 6 x daily - 7 x weekly - 1 sets - 5-10 reps - 3-5 seconds  hold - Supine Quadricep Sets  - 3 x daily - 7 x weekly - 1 sets - 10 reps - 5"  hold - Supine Heel Slide  - 3 x daily - 7 x weekly - 1 sets - 5 reps - 3-5" hold - Supine Heel Slide with Strap  - 3 x daily - 7 x weekly - 1 sets - 5 reps - 3-5" hold - Sidelying Hip Abduction  - 3 x daily - 7 x weekly - 1 sets - 10 reps - 3-5" hold - Supine Bridge  - 3 x daily - 7 x weekly - 1 sets - 10 reps - 5" hold - Supine Knee Extension Strengthening  - 3 x daily - 7 x weekly - 1 sets - 10 reps - 5" hold - Straight Leg Raise  - 2 x daily - 7 x weekly - 1 sets - 10 reps ASSESSMENT:  CLINICAL IMPRESSION: 10/15/22:  Session focus with knee mobility and proximal strengthening.  Manual patella mobs and PROM.  Increased hold times with stretches.  Aggressive PROM complete per pt.s tolerance.  AROM improved to 94 degrees flexion.    Eval:  Patient is a 57 y.o. female who was seen today for physical therapy evaluation and treatment for evaluation and treatment following a RT TKR.  Evaluation demonstrates decreased ROM, decreased activity tolerance, decreased ROM, decreased balance, increased edema and increased pain.  Erica Graves will benefit from skilled PT to address these issues and progress her to her maximal functioning level.   OBJECTIVE IMPAIRMENTS: decreased activity tolerance, decreased balance, difficulty walking, decreased ROM, decreased strength, increased edema, and pain.   ACTIVITY LIMITATIONS: carrying, lifting, bending, sitting, standing, squatting, sleeping, stairs,  and locomotion level  PARTICIPATION LIMITATIONS: cleaning, laundry, driving, shopping, community activity, and yard work   [facility] POTENTIAL: Good  CLINICAL DECISION MAKING: Stable/uncomplicated  EVALUATION COMPLEXITY: Low   GOALS: Goals reviewed with patient? No  SHORT TERM GOALS: Target date: 10/18/22 Pt to be I in her HEP in order to decrease her pain to no greater than a 1/10 Baseline: Goal status: IN PROGRESS  2.  Pt ROM to improve to  90  to allow increased comfort while sitting with her leg bent for  an hour. Baseline:  Goal status: IN PROGRESS  3.  Pt LE strength to be increased 1/2 grade to be able to walk with a cane.   Baseline:  Goal status: IN PROGRESS  LONG TERM GOALS: Target date: 11/08/22  Pt to be I in her HEP in order to increase her flexion to 115 to allow pt to squat down to the ground  Baseline:  Goal status: IN PROGRESS  2.  PT to be able to single leg stance on both LE for 20 " in order to reduce risk of falling  Baseline:  Goal status: IN PROGRESS  3.   Pt LE strength to be increased 1 grade to be able to walk without an assistive device and to be able to be up for 2 hr at a time to return to work. Baseline:  Goal status: IN PROGRESS  4.  PT to be able to go up and down 12 steps in a reciprocal manner   Baseline:  Goal status: IN PROGRESS      PLAN:  PT FREQUENCY: 2x/week  PT DURATION: 6 weeks  PLANNED INTERVENTIONS: Therapeutic exercises, Therapeutic activity, Balance training, Gait training, Patient/Family education, Self Care, Joint mobilization, and Manual therapy  PLAN FOR NEXT SESSION: Continue to progress ROM, strength and balance.  Increase stretch time to 1' long and more aggressive PROM for knee flexion.  Progress note next session for apt on Friday 10/19/22.  Becky Sax, LPTA/CLT; CBIS [phone]  Juel Burrow, PTA 10/15/2022, 12:29 PM  10/15/2022, 12:29 PM

## 2022-10-17 ENCOUNTER — Ambulatory Visit (HOSPITAL_COMMUNITY): Payer: BC Managed Care – PPO | Admitting: Physical Therapy

## 2022-10-17 DIAGNOSIS — M25561 Pain in right knee: Secondary | ICD-10-CM | POA: Diagnosis not present

## 2022-10-17 DIAGNOSIS — M25661 Stiffness of right knee, not elsewhere classified: Secondary | ICD-10-CM

## 2022-10-17 DIAGNOSIS — M6281 Muscle weakness (generalized): Secondary | ICD-10-CM

## 2022-10-17 NOTE — Therapy (Signed)
OUTPATIENT PHYSICAL THERAPY LOWER EXTREMITY TREATMENT   Patient Name: Erica Graves MRN: [id number] DOB:05-03-1965, 57 y.o., female Today's Date: 10/17/2022   END OF SESSION:   PT End of Session - 10/17/22 1201    Visit Number 7    Number of Visits 45    Date for PT Re-Evaluation 11/08/22    Authorization Type bc [id number]    Progress Note Due on Visit 10    PT Start Time 1123    PT Stop Time 1201    PT Time Calculation (min) 38 min    Activity Tolerance Patient tolerated treatment well;No increased pain    Behavior During Therapy [facility] for tasks assessed/performed               Past Medical History:  Diagnosis Date   Carpal tunnel syndrome, bilateral    Chronic low back pain    Constipation    Counseling for estrogen replacement therapy 07/07/2014   Depression with anxiety    Dyspareunia 07/07/2014   GAD (generalized anxiety disorder) 04/07/2020   History of kidney stones    Hot flashes 07/07/2014   Hypertension    Menopause 07/07/2014   Moody 07/07/2014   Obesity    Umbilical hernia    Past Surgical History:  Procedure Laterality Date   ABDOMINAL HYSTERECTOMY     BACK SURGERY     removal of cyst subdermal   CARPAL TUNNEL RELEASE Left 09/30/2014   Procedure: CARPAL TUNNEL RELEASE;  Surgeon: Vickki Hearing, MD;  Location: [facility];  Service: Orthopedics;  Laterality: Left;   CARPAL TUNNEL RELEASE Right 09/28/2015   Procedure: CARPAL TUNNEL RELEASE;  Surgeon: Vickki Hearing, MD;  Location: [facility];  Service: Orthopedics;  Laterality: Right;   COLONOSCOPY N/A 06/29/2013   Procedure: COLONOSCOPY;  Surgeon: West Bali, MD;  Location: [facility];  Service: Endoscopy;  Laterality: N/A;  1:30   ESOPHAGOGASTRODUODENOSCOPY (EGD) WITH PROPOFOL N/A 01/04/2022   Procedure: ESOPHAGOGASTRODUODENOSCOPY (EGD) WITH PROPOFOL;  Surgeon: Lanelle Bal, DO;  Location: [facility];  Service: Endoscopy;  Laterality: N/A;  12:30pm, asa 2   KNEE ARTHROSCOPY Right    KNEE  ARTHROSCOPY  05/21/2011   Procedure: ARTHROSCOPY KNEE;  Surgeon: Vickki Hearing, MD;  Location: [facility];  Service: Orthopedics;  Laterality: Left;  lateral menisectomy   KNEE CLOSED REDUCTION Left 05/26/2012   Procedure: CLOSED MANIPULATION KNEE;  Surgeon: Vickki Hearing, MD;  Location: [facility];  Service: Orthopedics;  Laterality: Left;   left knee arthroscopy  08/14/2005 Dr. Romeo Apple torn meniscus   LUMBAR SPINE SURGERY     PARTIAL HYSTERECTOMY     TOTAL KNEE ARTHROPLASTY  03/31/2012   Procedure: TOTAL KNEE ARTHROPLASTY;  Surgeon: Vickki Hearing, MD;  Location: [facility];  Service: Orthopedics;  Laterality: Left;  Left Total Knee Arthroplasty   TOTAL KNEE ARTHROPLASTY Right 09/11/2022   Procedure: TOTAL KNEE ARTHROPLASTY;  Surgeon: Vickki Hearing, MD;  Location: [facility];  Service: Orthopedics;  Laterality: Right;   TRIGGER FINGER RELEASE Right 01/24/2017   Procedure: RELEASE TRIGGER FINGER/A-1 PULLEY right thumb;  Surgeon: Vickki Hearing, MD;  Location: [facility];  Service: Orthopedics;  Laterality: Right;   Patient Active Problem List   Diagnosis Date Noted   S/P total knee replacement, right 09/11/22 09/25/2022   Primary localized osteoarthritis of right knee 09/11/2022   Caregiver stress 07/06/2022   Abdominal pain, epigastric 12/11/2021   Chronic idiopathic constipation 11/10/2021   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  Arthritis of carpometacarpal (CMC) joint of right thumb 10/07/2020   Radial styloid tenosynovitis (de quervain) 10/07/2020   Irritable mood 04/07/2020   GAD (generalized anxiety disorder) 04/07/2020   Vaginal itching 06/05/2019   Screening for colorectal cancer 06/05/2019   Screening for cholesterol level 06/05/2019   S/P trigger finger release 01/24/17 02/06/2017   Trigger finger of right thumb    Essential hypertension, benign 11/18/2015   Gastroesophageal reflux disease without esophagitis 05/06/2015   Hot flashes 07/07/2014   Dyspareunia 07/07/2014    Moody 07/07/2014   Menopause 07/07/2014   Counseling for estrogen replacement therapy 07/07/2014   Morbid obesity (HCC) 03/05/2014   Carpal tunnel syndrome of right wrist 12/22/2013   Vitamin D deficiency 10/14/2013   Migraine headache without aura 11/24/2012   Muscle contraction headache 11/24/2012   CTS (carpal tunnel syndrome) 08/14/2012   Stiffness of joint, not elsewhere classified, lower leg 04/22/2012   Weakness of left leg 04/22/2012   Difficulty walking 04/22/2012   S/P total knee replacement, left 03/31/12 04/03/2012   Arthritis of knee, degenerative 03/05/2012   Osteoarthritis of left knee 12/12/2011   S/P arthroscopy of left knee 06/13/2011   Lateral meniscus tear 06/13/2011   Stiffness of knee joint 06/05/2011   Medial meniscus, posterior horn derangement 05/16/2011   DEGENERATIVE JOINT DISEASE, KNEES, BILATERAL 12/19/2009   PATELLO-FEMORAL SYNDROME 12/19/2009   FOOT PAIN, LEFT 07/22/2007   DEPRESSION/ANXIETY 12/27/2006   SHOULDER PAIN, LEFT 12/27/2006   OVERWEIGHT 11/12/2006   CONSTIPATION 11/12/2006   Lumbago 11/12/2006    PCP: Lilyan Punt   REFERRING PROVIDER: Vickki Hearing, MD  REFERRING DIAG: M17.11 (ICD-10-CM) - Unilateral primary osteoarthritis, right knee  THERAPY DIAG:  Right knee pain Right knee stiffness Muscle weakness Difficulty in walking.  Rationale for Evaluation and Treatment: Rehabilitation  ONSET DATE: 09/11/2022    SUBJECTIVE STATEMENT: Pt states that she still feels as if she swells if she walks very much.  She does not have any pain     PERTINENT HISTORY: Lt TKR, OA, back pain  PAIN:  Are you having pain? Yes: NPRS scale: 0/10; max of 3  Pain location: Rt knee  Pain description: throb Aggravating factors: trying to bend the knee  Relieving factors: CPM  PRECAUTIONS: None   WEIGHT BEARING RESTRICTIONS: WBAT  FALLS:  Has patient fallen in last 6 months? No  LIVING ENVIRONMENT: Lives with: lives with their  family Lives in: House/apartment Stairs: Yes: External: 4 steps; on right going up Has following equipment at home: Single point cane, Environmental consultant - 2 wheeled, and Crutches  OCCUPATIONOffice manager up and down   PLOF: Independent  PATIENT GOALS: Walk without assistive device, better strength   NEXT MD VISIT: 10/18/22  OBJECTIVE:    PATIENT SURVEYS:  FOTO 39  COGNITION: Overall cognitive status: Within functional limits for tasks assessed     SENSATION: WFL  EDEMA: WNL for surgery   LOWER EXTREMITY ROM:  Active ROM Right eval Left eval 10/02/22 AROM: 10/08/22: 10/10/22: 10/17/22  Hip flexion        Hip extension        Hip abduction        Hip adduction        Hip internal rotation        Hip external rotation        Knee flexion 50  82 88 90 95 AA97  Knee extension Lacking 10 degrees   8 6 4    Ankle dorsiflexion  Ankle plantarflexion        Ankle inversion        Ankle eversion         (Blank rows = not tested)  LOWER EXTREMITY MMT:  MMT Right eval Left eval  Hip flexion 2   Hip extension    Hip abduction 3+   Hip adduction    Hip internal rotation    Hip external rotation    Knee flexion 2+   Knee extension 2+   Ankle dorsiflexion 5   Ankle plantarflexion    Ankle inversion    Ankle eversion     (Blank rows = not tested)   FUNCTIONAL TESTS:  30 seconds chair stand test: unable to come sit to stand without use of UE assist.  2 minute walk test: 226 ft with crutches  Single leg stance: RT: unable    TODAY'S TREATMENT:                                                                                                                              DATE:  10/17/22 Heel raise x 15 Terminal extension x 10 Standing knee flexion x 10 Lunging onto 12" box to improve flexion 30" x 3 Bike x 5 minutes Prone Hamstring curls 10x 5" holds -contract/relax 5x 30" holds -quad stretch 3 x 1 min with rope Supine: Heel slide with end PR 10/15/22: Bike  rocking seat 12 x 5' Standing: -Knee drive on 53GU step 44I 10" -Hamstring curls 10x 5"  Prone: -Hamstring curls 10x 5" holds -contract/relax 5x 30" holds -quad stretch 3 x 1 min with rope  Seated: -Contract/relax 5x 1' holds -Distraction with PROM 3x 30"  Supine: -Heel slides on therball 10x with long hold end range -SKTC with towel under knee 3x 1' -PROM 3x 1' -AROM 94 degrees flexion 10/12/22 Bike rocking seat 12 x 5' Standing: -Knee drive on 34VQ step 25Z 10" -Hamstring curls 10x 5" -TKE GTB 10x5" -wall slide 10x 5"  Prone: -Hamstring curls 10x 5" holds -TKE 10x 5" -Quad stretch 3x 30" with rope -Contract/relax for flexion 5x 10" holds  Seated -PROM 3x 30" for flexion  Supine: -PROM 3x 30" for flexion AROM 3-90 degrees   10/10/22: Bike rocking seat 13 x 5' Standing: -Knee drive on 2nd step (56LO) 75I 10" -TKE 10x 5" with RTB Supine: Manual decongestive techniques for edema control with LE elevated Prone: -Hamstring curls 10x 5" holds -TKE 10x 5" -Quad st 3x 30" with rope -Contract/relax for flexion 5x 10" holds Supine: AROM 4-90 degrees   10/08/22: Bike rocking seat 13 Standing: -Knee drive on 43PI step 95J 10" -Heel raises 10x 5" -Toe raises decline slope 10x 5" -TKE 10x 5" with RTB -Sit to stand 10 no HHA -Gait no AD good mechanics  -Slant board 3x 30" -wall squat 5x 5" Prone: -hamstring curl 10x 5" -quad st 3x 30" Supine: -heel slide 10x  -quad sets -AROM 6-88 degrees -  Hamstring stretch 3x 30" with rope Seated: -LAQ 10x  10/02/22: Reviewed goals Educated importance of HEP compliance for maximal benefits Supine:   -quad sets 10x 5"  - heel slide with strap 10x 5" holds at end range  -AROM 8-82 degrees  -SAQ 10x 5"  -Bridge 10x5  -SLR with quad set prior range 10x Sidelying:  -abduction Manual in supine with LE elevated:  -decongestive massage  -Scar tissue massage Ambulate with 1 crutch x 219ft with good mechanics Bike  rocking seat 13  09/27/22 Eval - Seated Long Arc Quad   5- 3-5 seconds  hold - Supine Quadricep Sets  -10 reps - 5"  hold - Supine Heel Slide   5 reps - 3-5" hold - Supine Heel Slide with Strap  - 5 reps - 3-5" hold Side lying Rt hip abduction x 10 Prone knee flexion x 10    PATIENT EDUCATION:  Education details: HEP Person educated: Patient Education method: Explanation, Verbal cues, and Handouts Education comprehension: returned demonstration  HOME EXERCISE PROGRAM: Access Code: [email or web address] URL: [email or web address] Date: 09/27/2022 Prepared by: Virgina Organ  Exercises - Seated Long Arc Quad  - 6 x daily - 7 x weekly - 1 sets - 5-10 reps - 3-5 seconds  hold - Supine Quadricep Sets  - 3 x daily - 7 x weekly - 1 sets - 10 reps - 5"  hold - Supine Heel Slide  - 3 x daily - 7 x weekly - 1 sets - 5 reps - 3-5" hold - Supine Heel Slide with Strap  - 3 x daily - 7 x weekly - 1 sets - 5 reps - 3-5" hold Side lying abduction x 10  Access Code: [email or web address] URL: [email or web address] Date: 10/12/2022 Prepared by: Becky Sax  Exercises - Seated Long Arc Quad  - 6 x daily - 7 x weekly - 1 sets - 5-10 reps - 3-5 seconds  hold - Supine Quadricep Sets  - 3 x daily - 7 x weekly - 1 sets - 10 reps - 5"  hold - Supine Heel Slide  - 3 x daily - 7 x weekly - 1 sets - 5 reps - 3-5" hold - Supine Heel Slide with Strap  - 3 x daily - 7 x weekly - 1 sets - 5 reps - 3-5" hold - Sidelying Hip Abduction  - 3 x daily - 7 x weekly - 1 sets - 10 reps - 3-5" hold - Supine Bridge  - 3 x daily - 7 x weekly - 1 sets - 10 reps - 5" hold - Supine Knee Extension Strengthening  - 3 x daily - 7 x weekly - 1 sets - 10 reps - 5" hold - Straight Leg Raise  - 2 x daily - 7 x weekly - 1 sets - 10 reps ASSESSMENT:  CLINICAL IMPRESSION: PT ambulating without any assistive device.  She is wearing compression garments.  PT treatments will continue to focus on improving ROM which is pt  primary deficit at this time. Once ROM is progressing we will work on balance.  Pt will continue to benefit from skilled PT to promote ROM, balance and functional mobility.     OBJECTIVE IMPAIRMENTS: decreased activity tolerance, decreased balance, difficulty walking, decreased ROM, decreased strength, increased edema, and pain.   ACTIVITY LIMITATIONS: carrying, lifting, bending, sitting, standing, squatting, sleeping, stairs, and locomotion level  PARTICIPATION LIMITATIONS: cleaning, laundry, driving, shopping, community activity, and yard work   [facility] POTENTIAL: Good  CLINICAL DECISION MAKING:  Stable/uncomplicated  EVALUATION COMPLEXITY: Low   GOALS: Goals reviewed with patient? No  SHORT TERM GOALS: Target date: 10/18/22 Pt to be I in her HEP in order to decrease her pain to no greater than a 1/10 Baseline: Goal status: IN PROGRESS  2.  Pt ROM to improve to  90  to allow increased comfort while sitting with her leg bent for  an hour. Baseline:  Goal status: IN PROGRESS  3.  Pt LE strength to be increased 1/2 grade to be able to walk with a cane.   Baseline:  Goal status: IN PROGRESS  LONG TERM GOALS: Target date: 11/08/22  Pt to be I in her HEP in order to increase her flexion to 115 to allow pt to squat down to the ground  Baseline:  Goal status: IN PROGRESS  2.  PT to be able to single leg stance on both LE for 20 " in order to reduce risk of falling  Baseline:  Goal status: IN PROGRESS  3.   Pt LE strength to be increased 1 grade to be able to walk without an assistive device and to be able to be up for 2 hr at a time to return to work. Baseline:  Goal status: IN PROGRESS  4.  PT to be able to go up and down 12 steps in a reciprocal manner  Baseline:  Goal status: IN PROGRESS      PLAN:  PT FREQUENCY: 2x/week  PT DURATION: 6 weeks  PLANNED INTERVENTIONS: Therapeutic exercises, Therapeutic activity, Balance training, Gait training, Patient/Family  education, Self Care, Joint mobilization, and Manual therapy  PLAN FOR NEXT SESSION: Continue to progress ROM, strength and balance.  Increase stretch time to 1' long and more aggressive PROM for knee flexion.  Progress note next session for apt on Friday 10/19/22. Virgina Organ, PT CLT [phone]  10/17/2022, 12:03 PM  10/17/2022, 12:03 PM

## 2022-10-19 ENCOUNTER — Ambulatory Visit (HOSPITAL_COMMUNITY): Payer: BC Managed Care – PPO | Admitting: Physical Therapy

## 2022-10-19 ENCOUNTER — Ambulatory Visit: Payer: BC Managed Care – PPO | Admitting: Orthopedic Surgery

## 2022-10-19 DIAGNOSIS — M25561 Pain in right knee: Secondary | ICD-10-CM | POA: Diagnosis not present

## 2022-10-19 DIAGNOSIS — M6281 Muscle weakness (generalized): Secondary | ICD-10-CM

## 2022-10-19 DIAGNOSIS — M76891 Other specified enthesopathies of right lower limb, excluding foot: Secondary | ICD-10-CM

## 2022-10-19 DIAGNOSIS — Z96651 Presence of right artificial knee joint: Secondary | ICD-10-CM

## 2022-10-19 DIAGNOSIS — M25661 Stiffness of right knee, not elsewhere classified: Secondary | ICD-10-CM

## 2022-10-19 NOTE — Progress Notes (Unsigned)
Chief Complaint  Patient presents with   Post-op Follow-up    Right knee replaced 09/11/22   Erica Graves is not progressing well with her physical therapy.  I can only get her to about 60 degrees of motion today.  She does come to full extension.  I recommended manipulation under anesthesia which we had to do on the left side she is agreeable  Set up manipulation right knee

## 2022-10-19 NOTE — Therapy (Signed)
OUTPATIENT PHYSICAL THERAPY LOWER EXTREMITY TREATMENT   Patient Name: Erica Graves MRN: [id number] DOB:November 14, 1965, 57 y.o., female Today's Date: 10/19/2022   END OF SESSION:   PT End of Session - 10/19/22 1117    Visit Number 8    Number of Visits 45    Date for PT Re-Evaluation 11/08/22    Authorization Type bc bs    Progress Note Due on Visit 10    PT Start Time 1039    PT Stop Time 1117    PT Time Calculation (min) 38 min    Activity Tolerance Patient tolerated treatment well;No increased pain    Behavior During Therapy [facility] for tasks assessed/performed                    Past Medical History:  Diagnosis Date   Carpal tunnel syndrome, bilateral    Chronic low back pain    Constipation    Counseling for estrogen replacement therapy 07/07/2014   Depression with anxiety    Dyspareunia 07/07/2014   GAD (generalized anxiety disorder) 04/07/2020   History of kidney stones    Hot flashes 07/07/2014   Hypertension    Menopause 07/07/2014   Moody 07/07/2014   Obesity    Umbilical hernia    Past Surgical History:  Procedure Laterality Date   ABDOMINAL HYSTERECTOMY     BACK SURGERY     removal of cyst subdermal   CARPAL TUNNEL RELEASE Left 09/30/2014   Procedure: CARPAL TUNNEL RELEASE;  Surgeon: Vickki Hearing, MD;  [facility]: [facility];  Service: Orthopedics;  Laterality: Left;   CARPAL TUNNEL RELEASE Right 09/28/2015   Procedure: CARPAL TUNNEL RELEASE;  Surgeon: Vickki Hearing, MD;  Location: [facility];  Service: Orthopedics;  Laterality: Right;   COLONOSCOPY N/A 06/29/2013   Procedure: COLONOSCOPY;  Surgeon: West Bali, MD;  [facility];  Service: Endoscopy;  Laterality: N/A;  1:30   ESOPHAGOGASTRODUODENOSCOPY (EGD) WITH PROPOFOL N/A 01/04/2022   Procedure: ESOPHAGOGASTRODUODENOSCOPY (EGD) WITH PROPOFOL;  Surgeon: Lanelle Bal, DO;  [facility];  Service: Endoscopy;  Laterality: N/A;  12:30pm, asa 2   KNEE ARTHROSCOPY Right    KNEE  ARTHROSCOPY  05/21/2011   Procedure: ARTHROSCOPY KNEE;  Surgeon: Vickki Hearing, MD;  Location: [facility];  Service: Orthopedics;  Laterality: Left;  lateral menisectomy   KNEE CLOSED REDUCTION Left 05/26/2012   Procedure: CLOSED MANIPULATION KNEE;  Surgeon: Vickki Hearing, MD;  Location: [facility];  Service: Orthopedics;  Laterality: Left;   left knee arthroscopy  08/14/2005 Dr. Romeo Apple torn meniscus   LUMBAR SPINE SURGERY     PARTIAL HYSTERECTOMY     TOTAL KNEE ARTHROPLASTY  03/31/2012   Procedure: TOTAL KNEE ARTHROPLASTY;  Surgeon: Vickki Hearing, MD;  Location: [facility];  Service: Orthopedics;  Laterality: Left;  Left Total Knee Arthroplasty   TOTAL KNEE ARTHROPLASTY Right 09/11/2022   Procedure: TOTAL KNEE ARTHROPLASTY;  Surgeon: Vickki Hearing, MD;  Location: [facility];  Service: Orthopedics;  Laterality: Right;   TRIGGER FINGER RELEASE Right 01/24/2017   Procedure: RELEASE TRIGGER FINGER/A-1 PULLEY right thumb;  Surgeon: Vickki Hearing, MD;  Location: [facility];  Service: Orthopedics;  Laterality: Right;   Patient Active Problem List   Diagnosis Date Noted   S/P total knee replacement, right 09/11/22 09/25/2022   Primary localized osteoarthritis of right knee 09/11/2022   Caregiver stress 07/06/2022   Abdominal pain, epigastric 12/11/2021   Chronic idiopathic constipation 11/10/2021  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   Arthritis of carpometacarpal (CMC) joint of right thumb 10/07/2020   Radial styloid tenosynovitis (de quervain) 10/07/2020   Irritable mood 04/07/2020   GAD (generalized anxiety disorder) 04/07/2020   Vaginal itching 06/05/2019   Screening for colorectal cancer 06/05/2019   Screening for cholesterol level 06/05/2019   S/P trigger finger release 01/24/17 02/06/2017   Trigger finger of right thumb    Essential hypertension, benign 11/18/2015   Gastroesophageal reflux disease without esophagitis 05/06/2015   Hot flashes 07/07/2014   Dyspareunia 07/07/2014    Moody 07/07/2014   Menopause 07/07/2014   Counseling for estrogen replacement therapy 07/07/2014   Morbid obesity (HCC) 03/05/2014   Carpal tunnel syndrome of right wrist 12/22/2013   Vitamin D deficiency 10/14/2013   Migraine headache without aura 11/24/2012   Muscle contraction headache 11/24/2012   CTS (carpal tunnel syndrome) 08/14/2012   Stiffness of joint, not elsewhere classified, lower leg 04/22/2012   Weakness of left leg 04/22/2012   Difficulty walking 04/22/2012   S/P total knee replacement, left 03/31/12 04/03/2012   Arthritis of knee, degenerative 03/05/2012   Osteoarthritis of left knee 12/12/2011   S/P arthroscopy of left knee 06/13/2011   Lateral meniscus tear 06/13/2011   Stiffness of knee joint 06/05/2011   Medial meniscus, posterior horn derangement 05/16/2011   DEGENERATIVE JOINT DISEASE, KNEES, BILATERAL 12/19/2009   PATELLO-FEMORAL SYNDROME 12/19/2009   FOOT PAIN, LEFT 07/22/2007   DEPRESSION/ANXIETY 12/27/2006   SHOULDER PAIN, LEFT 12/27/2006   OVERWEIGHT 11/12/2006   CONSTIPATION 11/12/2006   Lumbago 11/12/2006    PCP: Lilyan Punt   REFERRING PROVIDER: Vickki Hearing, MD  REFERRING DIAG: M17.11 (ICD-10-CM) - Unilateral primary osteoarthritis, right knee  THERAPY DIAG:  Right knee pain Right knee stiffness Muscle weakness Difficulty in walking.  Rationale for Evaluation and Treatment: Rehabilitation  ONSET DATE: 09/11/2022    SUBJECTIVE STATEMENT: Pt went to the MD; she is being scheduled for a manipulation next Tuesday, has an appointment here on Wed.    PERTINENT HISTORY: Lt TKR, OA, back pain  PAIN:  Are you having pain? Yes: NPRS scale: 0/10; max of 3  Pain location: Rt knee  Pain description: throb Aggravating factors: trying to bend the knee  Relieving factors: CPM  PRECAUTIONS: None   WEIGHT BEARING RESTRICTIONS: WBAT  FALLS:  Has patient fallen in last 6 months? No  LIVING ENVIRONMENT: Lives with: lives with their  family Lives in: House/apartment Stairs: Yes: External: 4 steps; on right going up Has following equipment at home: Single point cane, Environmental consultant - 2 wheeled, and Crutches  OCCUPATIONOffice manager up and down   PLOF: Independent  PATIENT GOALS: Walk without assistive device, better strength   NEXT MD VISIT: 10/18/22  OBJECTIVE:    PATIENT SURVEYS:  FOTO 39  COGNITION: Overall cognitive status: Within functional limits for tasks assessed     SENSATION: WFL  EDEMA: WNL for surgery   LOWER EXTREMITY ROM:  Active ROM Right eval Left eval 10/02/22 AROM: 10/08/22: 10/10/22: 10/17/22 10/19/22  Hip flexion         Hip extension         Hip abduction         Hip adduction         Hip internal rotation         Hip external rotation         Knee flexion 50  82 88 90 95 AA97 100   Knee extension Lacking 10 degrees  8 6 4     Ankle dorsiflexion         Ankle plantarflexion         Ankle inversion         Ankle eversion          (Blank rows = not tested)  LOWER EXTREMITY MMT:  MMT Right eval Left eval  Hip flexion 2   Hip extension    Hip abduction 3+   Hip adduction    Hip internal rotation    Hip external rotation    Knee flexion 2+   Knee extension 2+   Ankle dorsiflexion 5   Ankle plantarflexion    Ankle inversion    Ankle eversion     (Blank rows = not tested)   FUNCTIONAL TESTS:  30 seconds chair stand test: unable to come sit to stand without use of UE assist.  2 minute walk test: 226 ft with crutches  Single leg stance: RT: unable    TODAY'S TREATMENT:                                                                                                                              DATE:  10/19/22 Bike x 5 minutes Knee driver x 5 x 30" Rt knee on 12 " stool hold x 30" x 3  12 Stool behind pt; pt placed Rt foot on stool, now bend Lt knee 5 x relax x 5 reps Prone Hamstring curls 10x 5" holds -contract/relax 5x 30" holds -quad stretch 3 x 1 min with  rope Supine: Heel slide with end PR Sitting: Knee flexion at elevated position x 15 PROM  10/17/22 Heel raise x 15 Terminal extension x 10 Standing knee flexion x 10 Lunging onto 12" box to improve flexion 30" x 3 Bike x 5 minutes Prone Hamstring curls 10x 5" holds -contract/relax 5x 30" holds -quad stretch 3 x 1 min with rope Supine: Heel slide with end PR 10/15/22: Bike rocking seat 12 x 5' Standing: -Knee drive on 13YQ step 65H 10" -Hamstring curls 10x 5"  Prone: -Hamstring curls 10x 5" holds -contract/relax 5x 30" holds -quad stretch 3 x 1 min with rope  Seated: -Contract/relax 5x 1' holds -Distraction with PROM 3x 30"  Supine: -Heel slides on therball 10x with long hold end range -SKTC with towel under knee 3x 1' -PROM 3x 1' -AROM 94 degrees flexion 10/12/22 Bike rocking seat 12 x 5' Standing: -Knee drive on 84ON step 62X 10" -Hamstring curls 10x 5" -TKE GTB 10x5" -wall slide 10x 5"  Prone: -Hamstring curls 10x 5" holds -TKE 10x 5" -Quad stretch 3x 30" with rope -Contract/relax for flexion 5x 10" holds  Seated -PROM 3x 30" for flexion  Supine: -PROM 3x 30" for flexion AROM 3-90 degrees   10/10/22: Bike rocking seat 13 x 5' Standing: -Knee drive on 2nd step (52WU) 13K 10" -TKE 10x 5" with RTB Supine: Manual decongestive techniques for edema control with  LE elevated Prone: -Hamstring curls 10x 5" holds -TKE 10x 5" -Quad st 3x 30" with rope -Contract/relax for flexion 5x 10" holds Supine: AROM 4-90 degrees   10/08/22: Bike rocking seat 13 Standing: -Knee drive on 16XW step 96E 10" -Heel raises 10x 5" -Toe raises decline slope 10x 5" -TKE 10x 5" with RTB -Sit to stand 10 no HHA -Gait no AD good mechanics  -Slant board 3x 30" -wall squat 5x 5" Prone: -hamstring curl 10x 5" -quad st 3x 30" Supine: -heel slide 10x  -quad sets -AROM 6-88 degrees -Hamstring stretch 3x 30" with rope Seated: -LAQ 10x  10/02/22: Reviewed  goals Educated importance of HEP compliance for maximal benefits Supine:   -quad sets 10x 5"  - heel slide with strap 10x 5" holds at end range  -AROM 8-82 degrees  -SAQ 10x 5"  -Bridge 10x5  -SLR with quad set prior range 10x Sidelying:  -abduction Manual in supine with LE elevated:  -decongestive massage  -Scar tissue massage Ambulate with 1 crutch x 230ft with good mechanics Bike rocking seat 13  09/27/22 Eval - Seated Long Arc Quad   5- 3-5 seconds  hold - Supine Quadricep Sets  -10 reps - 5"  hold - Supine Heel Slide   5 reps - 3-5" hold - Supine Heel Slide with Strap  - 5 reps - 3-5" hold Side lying Rt hip abduction x 10 Prone knee flexion x 10    PATIENT EDUCATION:  Education details: HEP Person educated: Patient Education method: Explanation, Verbal cues, and Handouts Education comprehension: returned demonstration  HOME EXERCISE PROGRAM: Access Code: [email or web address] URL: [email or web address] Date: 09/27/2022 Prepared by: Virgina Organ  Exercises - Seated Long Arc Quad  - 6 x daily - 7 x weekly - 1 sets - 5-10 reps - 3-5 seconds  hold - Supine Quadricep Sets  - 3 x daily - 7 x weekly - 1 sets - 10 reps - 5"  hold - Supine Heel Slide  - 3 x daily - 7 x weekly - 1 sets - 5 reps - 3-5" hold - Supine Heel Slide with Strap  - 3 x daily - 7 x weekly - 1 sets - 5 reps - 3-5" hold Side lying abduction x 10  Access Code: [email or web address] URL: [email or web address] Date: 10/12/2022 Prepared by: Becky Sax  Exercises - Seated Long Arc Quad  - 6 x daily - 7 x weekly - 1 sets - 5-10 reps - 3-5 seconds  hold - Supine Quadricep Sets  - 3 x daily - 7 x weekly - 1 sets - 10 reps - 5"  hold - Supine Heel Slide  - 3 x daily - 7 x weekly - 1 sets - 5 reps - 3-5" hold - Supine Heel Slide with Strap  - 3 x daily - 7 x weekly - 1 sets - 5 reps - 3-5" hold - Sidelying Hip Abduction  - 3 x daily - 7 x weekly - 1 sets - 10 reps - 3-5" hold - Supine Bridge  - 3  x daily - 7 x weekly - 1 sets - 10 reps - 5" hold - Supine Knee Extension Strengthening  - 3 x daily - 7 x weekly - 1 sets - 10 reps - 5" hold - Straight Leg Raise  - 2 x daily - 7 x weekly - 1 sets - 10 reps ASSESSMENT:  CLINICAL IMPRESSION: Pt is scheduled to have a MUA on Tuesday.  She will return to therapy on  Wed.  Aggressive ROM following MUA  Pt will continue to benefit from skilled PT to promote ROM, balance and functional mobility.     OBJECTIVE IMPAIRMENTS: decreased activity tolerance, decreased balance, difficulty walking, decreased ROM, decreased strength, increased edema, and pain.   ACTIVITY LIMITATIONS: carrying, lifting, bending, sitting, standing, squatting, sleeping, stairs, and locomotion level  PARTICIPATION LIMITATIONS: cleaning, laundry, driving, shopping, community activity, and yard work   [facility] POTENTIAL: Good  CLINICAL DECISION MAKING: Stable/uncomplicated  EVALUATION COMPLEXITY: Low   GOALS: Goals reviewed with patient? No  SHORT TERM GOALS: Target date: 10/18/22 Pt to be I in her HEP in order to decrease her pain to no greater than a 1/10 Baseline: Goal status: IN PROGRESS  2.  Pt ROM to improve to  90  to allow increased comfort while sitting with her leg bent for  an hour. Baseline:  Goal status: IN PROGRESS  3.  Pt LE strength to be increased 1/2 grade to be able to walk with a cane.   Baseline:  Goal status: IN PROGRESS  LONG TERM GOALS: Target date: 11/08/22  Pt to be I in her HEP in order to increase her flexion to 115 to allow pt to squat down to the ground  Baseline:  Goal status: IN PROGRESS  2.  PT to be able to single leg stance on both LE for 20 " in order to reduce risk of falling  Baseline:  Goal status: IN PROGRESS  3.   Pt LE strength to be increased 1 grade to be able to walk without an assistive device and to be able to be up for 2 hr at a time to return to work. Baseline:  Goal status: IN PROGRESS  4.  PT to be able  to go up and down 12 steps in a reciprocal manner  Baseline:  Goal status: IN PROGRESS      PLAN:  PT FREQUENCY: 2x/week  PT DURATION: 6 weeks  PLANNED INTERVENTIONS: Therapeutic exercises, Therapeutic activity, Balance training, Gait training, Patient/Family education, Self Care, Joint mobilization, and Manual therapy  PLAN FOR NEXT SESSION: Continue to progress ROM, strength and balance.  Increase stretch time to 1' long and more aggressive PROM for knee flexion.  Progress note next session for apt on Friday 10/19/22. Virgina Organ, PT CLT [phone]  10/19/2022, 11:17 AM

## 2022-10-19 NOTE — Patient Instructions (Signed)
 Your surgery will be at [facility] by Dr Romeo Apple  The hospital will contact you with a preoperative appointment to discuss Anesthesia.  Please arrive on time or 15 minutes early for the preoperative appointment, they have a very tight schedule if you are late or do not come in your surgery will be cancelled.  The phone number is [phone]. Please bring your medications with you for the appointment. They will tell you the arrival time and medication instructions when you have your preoperative evaluation. Do not wear nail polish the day of your surgery and if you take Phentermine you need to stop this medication ONE WEEK prior to your surgery. If you take Docia Barrier, Jardiance, or Steglatro) - Hold 72 hours before the procedure.  If you take Ozempic,  Mounjaro, Bydureon or Trulicity do not take for 8 days before your surgery. If you take Victoza, Rybelsis, Saxenda or Adlyxi stop 24 hours before the procedure.  Please arrive at the hospital 2 hours before procedure if scheduled at 9:30 or later in the day or at the time the nurse tells you at your preoperative visit.   If you have my chart do not use the time given in my chart use the time given to you by the nurse during your preoperative visit.   Your surgery  time may change. Please be available for phone calls the day of your surgery and the day before. The Short Stay department may need to discuss changes about your surgery time. Not reaching the you could lead to procedure delays and possible cancellation.  You must have a ride home and someone to stay with you for 24 to 48 hours. The person taking you home will receive and sign for the your discharge instructions.  Please be prepared to give your support person's name and telephone number to Central Registration. Dr Romeo Apple will need that name and phone number post procedure.

## 2022-10-20 NOTE — H&P (Signed)
Erica Graves is an 57 y.o. female.   Chief Complaint: Stiffness after right total knee arthroplasty HPI: Erica Graves is 57 years old she is status post left and right total knee arthroplasty  She had a left total knee arthroplasty several years ago and required manipulation  She recently had a right total knee arthroplasty 09/11/2022 and has developed postop stiffness  She left the hospital with 0 to 95 degree range of motion she came to the office last week with only 60 degree range of motion and complaining of tightness  Since she had a manipulation last time she does not want to wait for the need to stiffen up more and would like to proceed with manipulation now and I am in agreement with that  Past Medical History:  Diagnosis Date   Carpal tunnel syndrome, bilateral    Chronic low back pain    Constipation    Counseling for estrogen replacement therapy 07/07/2014   Depression with anxiety    Dyspareunia 07/07/2014   GAD (generalized anxiety disorder) 04/07/2020   History of kidney stones    Hot flashes 07/07/2014   Hypertension    Menopause 07/07/2014   [facility] 07/07/2014   Obesity    Umbilical hernia     Past Surgical History:  Procedure Laterality Date   ABDOMINAL HYSTERECTOMY     BACK SURGERY     removal of cyst subdermal   CARPAL TUNNEL RELEASE Left 09/30/2014   Procedure: CARPAL TUNNEL RELEASE;  Surgeon: Vickki Hearing, MD;  [facility];  Service: Orthopedics;  Laterality: Left;   CARPAL TUNNEL RELEASE Right 09/28/2015   Procedure: CARPAL TUNNEL RELEASE;  Surgeon: Vickki Hearing, MD;  Location: [facility];  Service: Orthopedics;  Laterality: Right;   COLONOSCOPY N/A 06/29/2013   Procedure: COLONOSCOPY;  Surgeon: West Bali, MD;  [facility];  Service: Endoscopy;  Laterality: N/A;  1:30   ESOPHAGOGASTRODUODENOSCOPY (EGD) WITH PROPOFOL N/A 01/04/2022   Procedure: ESOPHAGOGASTRODUODENOSCOPY (EGD) WITH PROPOFOL;  Surgeon: Lanelle Bal, DO;  [facility];  Service: Endoscopy;  Laterality: N/A;  12:30pm, asa 2   KNEE ARTHROSCOPY Right    KNEE ARTHROSCOPY  05/21/2011   Procedure: ARTHROSCOPY KNEE;  Surgeon: Vickki Hearing, MD;  Location: [facility];  Service: Orthopedics;  Laterality: Left;  lateral menisectomy   KNEE CLOSED REDUCTION Left 05/26/2012   Procedure: CLOSED MANIPULATION KNEE;  Surgeon: Vickki Hearing, MD;  Location: [facility];  Service: Orthopedics;  Laterality: Left;   left knee arthroscopy  08/14/2005 Dr. Romeo Graves torn meniscus   LUMBAR SPINE SURGERY     PARTIAL HYSTERECTOMY     TOTAL KNEE ARTHROPLASTY  03/31/2012   Procedure: TOTAL KNEE ARTHROPLASTY;  Surgeon: Vickki Hearing, MD;  Location: [facility];  Service: Orthopedics;  Laterality: Left;  Left Total Knee Arthroplasty   TOTAL KNEE ARTHROPLASTY Right 09/11/2022   Procedure: TOTAL KNEE ARTHROPLASTY;  Surgeon: Vickki Hearing, MD;  Location: [facility];  Service: Orthopedics;  Laterality: Right;   TRIGGER FINGER RELEASE Right 01/24/2017   Procedure: RELEASE TRIGGER FINGER/A-1 PULLEY right thumb;  Surgeon: Vickki Hearing, MD;  Location: [facility];  Service: Orthopedics;  Laterality: Right;    Family History  Problem Relation Age of Onset   Heart disease Mother    Hypertension Mother    Colon cancer Neg Hx    Social History:  reports that she has never smoked. She has never used smokeless tobacco. She reports current alcohol use. She  reports that she does not use drugs.  Allergies:  Allergies  Allergen Reactions   Norco [Hydrocodone-Acetaminophen] Nausea And Vomiting    No medications prior to admission.    No results found for this or any previous visit (from the past 48 hour(s)). No results found.  Review of Systems  There were no vitals taken for this visit. Physical Exam Vitals and nursing note reviewed.  Constitutional:      General: She is not in acute distress.    Appearance: Normal appearance. She is normal weight. She is not ill-appearing,  toxic-appearing or diaphoretic.  HENT:     Head: Normocephalic and atraumatic.     Nose: No congestion or rhinorrhea.     Mouth/Throat:     Mouth: Mucous membranes are moist.     Pharynx: No oropharyngeal exudate or posterior oropharyngeal erythema.  Eyes:     General: No scleral icterus.       Right eye: No discharge.        Left eye: No discharge.     Extraocular Movements: Extraocular movements intact.     Conjunctiva/sclera: Conjunctivae normal.     Pupils: Pupils are equal, round, and reactive to light.  Cardiovascular:     Rate and Rhythm: Normal rate and regular rhythm.     Pulses: Normal pulses.  Pulmonary:     Effort: Pulmonary effort is normal.  Abdominal:     General: Abdomen is flat. There is no distension.     Palpations: Abdomen is soft.  Musculoskeletal:     Cervical back: Normal range of motion.  Skin:    Capillary Refill: Capillary refill takes more than 3 seconds.  Neurological:     General: No focal deficit present.     Mental Status: She is alert and oriented to person, place, and time. Mental status is at baseline.     Cranial Nerves: No cranial nerve deficit.     Motor: No weakness.     Coordination: Coordination normal.     Gait: Gait normal.     Deep Tendon Reflexes: Reflexes normal.  Psychiatric:        Mood and Affect: Mood normal.        Behavior: Behavior normal.        Thought Content: Thought content normal.        Judgment: Judgment normal.     The right knee is 0-60 with no signs of infection incision warm dry and intact no signs of DVT neurovascular exam also normal no warmth to the joint  No additional preop imaging was noted  Postop imaging after surgery showed normal alignment and full extension  No evidence of notching   Assessment/Plan Postop arthrofibrosis right total knee  Right total knee manipulation under anesthesia  Erica Canada, MD 10/20/2022, 8:54 AM

## 2022-10-22 ENCOUNTER — Encounter (HOSPITAL_COMMUNITY)
Admission: RE | Admit: 2022-10-22 | Discharge: 2022-10-22 | Disposition: A | Discharge status: Home / Self Care | Payer: BC Managed Care – PPO | Source: Ambulatory Visit | Attending: Orthopedic Surgery | Admitting: Orthopedic Surgery

## 2022-10-22 ENCOUNTER — Telehealth (HOSPITAL_COMMUNITY): Payer: Self-pay

## 2022-10-22 ENCOUNTER — Encounter (HOSPITAL_COMMUNITY): Payer: Self-pay

## 2022-10-22 ENCOUNTER — Encounter (HOSPITAL_COMMUNITY): Payer: BC Managed Care – PPO

## 2022-10-22 NOTE — Patient Instructions (Signed)
TATUMN SWIECICKI  10/22/2022     @PREFPERIOPPHARMACY @   Your procedure is scheduled on 10/23/2022.  Report to [facility] at 7:00 A.M.  Call this number if you have problems the morning of surgery:  [phone]  If you experience any cold or flu symptoms such as cough, fever, chills, shortness of breath, etc. between now and your scheduled surgery, please notify us at the above number.   Remember:   Do not eat or drink after midnight.      Take these medicines the morning of surgery with A SIP OF WATER : Amlodipine and Pantoprazole    Do not wear jewelry, make-up or nail polish, including gel polish,  artificial nails, or any other type of covering on natural nails (fingers and  toes).  Do not wear lotions, powders, or perfumes, or deodorant.  Do not shave 48 hours prior to surgery.  Men may shave face and neck.  Do not bring valuables to the hospital.  [facility] is not responsible for any belongings or valuables.  Contacts, dentures or bridgework may not be worn into surgery.  Leave your suitcase in the car.  After surgery it may be brought to your room.  For patients admitted to the hospital, discharge time will be determined by your treatment team.  Patients discharged the day of surgery will not be allowed to drive home.   Name and phone number of your driver:   family Special instructions:  N/A  Please read over the following fact sheets that you were given. Care and Recovery After Surgery  General Anesthesia, Adult General anesthesia is the use of medicine to make you fall asleep (unconscious) for a medical procedure. General anesthesia must be used for certain procedures. It is often recommended for surgery or procedures that: Last a long time. Require you to be still or in an unusual position. Are major and can cause blood loss. Affect your breathing. The medicines used for general anesthesia are called general anesthetics. During general anesthesia, these  medicines are given along with medicines that: Prevent pain. Control your blood pressure. Relax your muscles. Prevent nausea and vomiting after the procedure. Tell a health care provider about: Any allergies you have. All medicines you are taking, including vitamins, herbs, eye drops, creams, and over-the-counter medicines. Your history of any: Medical conditions you have, including: High blood pressure. Bleeding problems. Diabetes. Heart or lung conditions, such as: Heart failure. Sleep apnea. Asthma. Chronic obstructive pulmonary disease (COPD). Current or recent illnesses, such as: Upper respiratory, chest, or ear infections. Cough or fever. Tobacco or drug use, including marijuana or alcohol use. Depression or anxiety. Surgeries and types of anesthetics you have had. Problems you or family members have had with anesthetic medicines. Whether you are pregnant or may be pregnant. Whether you have any chipped or loose teeth, dentures, caps, bridgework, or issues with your mouth, swallowing, or choking. What are the risks? Your health care provider will talk with you about risks. These may include: Allergic reaction to the medicines. Lung and heart problems. Inhaling food or liquid from the stomach into the lungs (aspiration). Nerve injury. Injury to the lips, mouth, teeth, or gums. Stroke. Waking up during your procedure and being unable to move. This is rare. These problems are more likely to develop if you are having a major surgery or if you have an advanced or serious medical condition. You can prevent some of these complications by answering all of your health care provider's questions  thoroughly and by following all instructions before your procedure. General anesthesia can cause side effects, including: Nausea or vomiting. A sore throat or hoarseness from the breathing tube. Wheezing or coughing. Shaking chills or feeling cold. Body aches. Sleepiness. Confusion,  agitation (delirium), or anxiety. What happens before the procedure? When to stop eating and drinking Follow instructions from your health care provider about what you may eat and drink before your procedure. If you do not follow your health care provider's instructions, your procedure may be delayed or canceled. Medicines Ask your health care provider about: Changing or stopping your regular medicines. These include any diabetes medicines or blood thinners you take. Taking medicines such as aspirin and ibuprofen. These medicines can thin your blood. Do not take them unless your health care provider tells you to. Taking over-the-counter medicines, vitamins, herbs, and supplements. General instructions Do not use any products that contain nicotine or tobacco for at least 4 weeks before the procedure. These products include cigarettes, chewing tobacco, and vaping devices, such as e-cigarettes. If you need help quitting, ask your health care provider. If you brush your teeth on the morning of the procedure, make sure to spit out all of the water and toothpaste. If told by your health care provider, bring your sleep apnea device with you to surgery (if applicable). If you will be going home right after the procedure, plan to have a responsible adult: Take you home from the hospital or clinic. You will not be allowed to drive. Care for you for the time you are told. What happens during the procedure?  An IV will be inserted into one of your veins. You will be given one or more of the following through a face mask or IV: A sedative. This helps you relax. Anesthesia. This will: Numb certain areas of your body. Make you fall asleep for surgery. After you are unconscious, a breathing tube may be inserted down your throat to help you breathe. This will be removed before you wake up. An anesthesia provider, such as an anesthesiologist, will stay with you throughout your procedure. The anesthesia  provider will: Keep you comfortable and safe by continuing to give you medicines and adjusting the amount of medicine that you get. Monitor your blood pressure, heart rate, and oxygen levels to make sure that the anesthetics do not cause any problems. The procedure may vary among health care providers and hospitals. What happens after the procedure? Your blood pressure, temperature, heart rate, breathing rate, and blood oxygen level will be monitored until you leave the hospital or clinic. You will wake up in a recovery area. You may wake up slowly. You may be given medicine to help you with pain, nausea, or any other side effects from the anesthesia. Summary General anesthesia is the use of medicine to make you fall asleep (unconscious) for a medical procedure. Follow your health care provider's instructions about when to stop eating, drinking, or taking certain medicines before your procedure. Plan to have a responsible adult take you home from the hospital or clinic. This information is not intended to replace advice given to you by your health care provider. Make sure you discuss any questions you have with your health care provider. Document Revised: 05/11/2021 Document Reviewed: 05/11/2021 Elsevier Patient Education  2024 [employer]  How to Use Chlorhexidine Before Surgery Chlorhexidine gluconate (CHG) is a germ-killing (antiseptic) solution that is used to clean the skin. It can get rid of the bacteria that normally live on the  skin and can keep them away for about 24 hours. To clean your skin with CHG, you may be given: A CHG solution to use in the shower or as part of a sponge bath. A prepackaged cloth that contains CHG. Cleaning your skin with CHG may help lower the risk for infection: While you are staying in the intensive care unit of the hospital. If you have a vascular access, such as a central line, to provide short-term or long-term access to your veins. If you have a  catheter to drain urine from your bladder. If you are on a ventilator. A ventilator is a machine that helps you breathe by moving air in and out of your lungs. After surgery. What are the risks? Risks of using CHG include: A skin reaction. Hearing loss, if CHG gets in your ears and you have a perforated eardrum. Eye injury, if CHG gets in your eyes and is not rinsed out. The CHG product catching fire. Make sure that you avoid smoking and flames after applying CHG to your skin. Do not use CHG: If you have a chlorhexidine allergy or have previously reacted to chlorhexidine. On babies younger than 9 months of age. How to use CHG solution Use CHG only as told by your health care provider, and follow the instructions on the label. Use the full amount of CHG as directed. Usually, this is one bottle. During a shower Follow these steps when using CHG solution during a shower (unless your health care provider gives you different instructions): Start the shower. Use your normal soap and shampoo to wash your face and hair. Turn off the shower or move out of the shower stream. Pour the CHG onto a clean washcloth. Do not use any type of brush or rough-edged sponge. Starting at your neck, lather your body down to your toes. Make sure you follow these instructions: If you will be having surgery, pay special attention to the part of your body where you will be having surgery. Scrub this area for at least 1 minute. Do not use CHG on your head or face. If the solution gets into your ears or eyes, rinse them well with water. Avoid your genital area. Avoid any areas of skin that have broken skin, cuts, or scrapes. Scrub your back and under your arms. Make sure to wash skin folds. Let the lather sit on your skin for 1-2 minutes or as long as told by your health care provider. Thoroughly rinse your entire body in the shower. Make sure that all body creases and crevices are rinsed well. Dry off with a clean  towel. Do not put any substances on your body afterward--such as powder, lotion, or perfume--unless you are told to do so by your health care provider. Only use lotions that are recommended by the manufacturer. Put on clean clothes or pajamas. If it is the night before your surgery, sleep in clean sheets.  During a sponge bath Follow these steps when using CHG solution during a sponge bath (unless your health care provider gives you different instructions): Use your normal soap and shampoo to wash your face and hair. Pour the CHG onto a clean washcloth. Starting at your neck, lather your body down to your toes. Make sure you follow these instructions: If you will be having surgery, pay special attention to the part of your body where you will be having surgery. Scrub this area for at least 1 minute. Do not use CHG on your head or face.  If the solution gets into your ears or eyes, rinse them well with water. Avoid your genital area. Avoid any areas of skin that have broken skin, cuts, or scrapes. Scrub your back and under your arms. Make sure to wash skin folds. Let the lather sit on your skin for 1-2 minutes or as long as told by your health care provider. Using a different clean, wet washcloth, thoroughly rinse your entire body. Make sure that all body creases and crevices are rinsed well. Dry off with a clean towel. Do not put any substances on your body afterward--such as powder, lotion, or perfume--unless you are told to do so by your health care provider. Only use lotions that are recommended by the manufacturer. Put on clean clothes or pajamas. If it is the night before your surgery, sleep in clean sheets. How to use CHG prepackaged cloths Only use CHG cloths as told by your health care provider, and follow the instructions on the label. Use the CHG cloth on clean, dry skin. Do not use the CHG cloth on your head or face unless your health care provider tells you to. When washing with the  CHG cloth: Avoid your genital area. Avoid any areas of skin that have broken skin, cuts, or scrapes. Before surgery Follow these steps when using a CHG cloth to clean before surgery (unless your health care provider gives you different instructions): Using the CHG cloth, vigorously scrub the part of your body where you will be having surgery. Scrub using a back-and-forth motion for 3 minutes. The area on your body should be completely wet with CHG when you are done scrubbing. Do not rinse. Discard the cloth and let the area air-dry. Do not put any substances on the area afterward, such as powder, lotion, or perfume. Put on clean clothes or pajamas. If it is the night before your surgery, sleep in clean sheets.  For general bathing Follow these steps when using CHG cloths for general bathing (unless your health care provider gives you different instructions). Use a separate CHG cloth for each area of your body. Make sure you wash between any folds of skin and between your fingers and toes. Wash your body in the following order, switching to a new cloth after each step: The front of your neck, shoulders, and chest. Both of your arms, under your arms, and your hands. Your stomach and groin area, avoiding the genitals. Your right leg and foot. Your left leg and foot. The back of your neck, your back, and your buttocks. Do not rinse. Discard the cloth and let the area air-dry. Do not put any substances on your body afterward--such as powder, lotion, or perfume--unless you are told to do so by your health care provider. Only use lotions that are recommended by the manufacturer. Put on clean clothes or pajamas. Contact a health care provider if: Your skin gets irritated after scrubbing. You have questions about using your solution or cloth. You swallow any chlorhexidine. Call your local poison control center ([phone] in the [place] Get help right away if: Your eyes itch badly, or they become  very red or swollen. Your skin itches badly and is red or swollen. Your hearing changes. You have trouble seeing. You have swelling or tingling in your mouth or throat. You have trouble breathing. These symptoms may represent a serious problem that is an emergency. Do not wait to see if the symptoms will go away. Get medical help right away. Call your local emergency services (  911 in the [place] Do not drive yourself to the hospital. Summary Chlorhexidine gluconate (CHG) is a germ-killing (antiseptic) solution that is used to clean the skin. Cleaning your skin with CHG may help to lower your risk for infection. You may be given CHG to use for bathing. It may be in a bottle or in a prepackaged cloth to use on your skin. Carefully follow your health care provider's instructions and the instructions on the product label. Do not use CHG if you have a chlorhexidine allergy. Contact your health care provider if your skin gets irritated after scrubbing. This information is not intended to replace advice given to you by your health care provider. Make sure you discuss any questions you have with your health care provider. Document Revised: 06/12/2021 Document Reviewed: 04/25/2020 Elsevier Patient Education  2023 [employer].

## 2022-10-22 NOTE — Telephone Encounter (Signed)
Therapist initially though no show, called pt who stated she had called and canceled today's apt prior manipulation scheduled for tomorrow. Call transfered to front desk for additional apts to schedule following procedure.   Becky Sax, LPTA/CLT; [place] [phone]

## 2022-10-23 ENCOUNTER — Ambulatory Visit (HOSPITAL_COMMUNITY): Payer: BC Managed Care – PPO | Admitting: Anesthesiology

## 2022-10-23 ENCOUNTER — Ambulatory Visit (HOSPITAL_COMMUNITY)
Admission: RE | Admit: 2022-10-23 | Discharge: 2022-10-23 | Disposition: A | Discharge status: Home / Self Care | Payer: BC Managed Care – PPO | Attending: Orthopedic Surgery | Admitting: Orthopedic Surgery

## 2022-10-23 ENCOUNTER — Encounter (HOSPITAL_COMMUNITY)
Admission: RE | Disposition: A | Discharge status: Home / Self Care | Payer: Self-pay | Source: Home / Self Care | Attending: Orthopedic Surgery

## 2022-10-23 DIAGNOSIS — I10 Essential (primary) hypertension: Secondary | ICD-10-CM | POA: Diagnosis not present

## 2022-10-23 DIAGNOSIS — M24661 Ankylosis, right knee: Secondary | ICD-10-CM | POA: Insufficient documentation

## 2022-10-23 DIAGNOSIS — M76891 Other specified enthesopathies of right lower limb, excluding foot: Secondary | ICD-10-CM

## 2022-10-23 DIAGNOSIS — Z96651 Presence of right artificial knee joint: Secondary | ICD-10-CM | POA: Diagnosis not present

## 2022-10-23 DIAGNOSIS — M238X1 Other internal derangements of right knee: Secondary | ICD-10-CM | POA: Diagnosis present

## 2022-10-23 DIAGNOSIS — E669 Obesity, unspecified: Secondary | ICD-10-CM | POA: Diagnosis not present

## 2022-10-23 DIAGNOSIS — Z96653 Presence of artificial knee joint, bilateral: Secondary | ICD-10-CM | POA: Diagnosis not present

## 2022-10-23 DIAGNOSIS — M549 Dorsalgia, unspecified: Secondary | ICD-10-CM | POA: Insufficient documentation

## 2022-10-23 DIAGNOSIS — G8929 Other chronic pain: Secondary | ICD-10-CM | POA: Insufficient documentation

## 2022-10-23 HISTORY — PX: KNEE CLOSED REDUCTION: SHX995

## 2022-10-23 LAB — CBC WITH DIFFERENTIAL/PLATELET
Abs Immature Granulocytes: 0.01 10*3/uL (ref 0.00–0.07)
Basophils Absolute: 0 10*3/uL (ref 0.0–0.1)
Basophils Relative: 1 %
Eosinophils Absolute: 0.2 10*3/uL (ref 0.0–0.5)
Eosinophils Relative: 5 %
HCT: 39.6 % (ref 36.0–46.0)
Hemoglobin: 13 g/dL (ref 12.0–15.0)
Immature Granulocytes: 0 %
Lymphocytes Relative: 46 %
Lymphs Abs: 1.9 10*3/uL (ref 0.7–4.0)
MCH: 28.8 pg (ref 26.0–34.0)
MCHC: 32.8 g/dL (ref 30.0–36.0)
MCV: 87.8 fL (ref 80.0–100.0)
Monocytes Absolute: 0.4 10*3/uL (ref 0.1–1.0)
Monocytes Relative: 10 %
Neutro Abs: 1.5 10*3/uL — ABNORMAL LOW (ref 1.7–7.7)
Neutrophils Relative %: 38 %
Platelets: 171 10*3/uL (ref 150–400)
RBC: 4.51 MIL/uL (ref 3.87–5.11)
RDW: 12.1 % (ref 11.5–15.5)
WBC: 4 10*3/uL (ref 4.0–10.5)
nRBC: 0 % (ref 0.0–0.2)

## 2022-10-23 LAB — BASIC METABOLIC PANEL
Anion gap: 10 (ref 5–15)
BUN: 18 mg/dL (ref 6–20)
CO2: 26 mmol/L (ref 22–32)
Calcium: 9.6 mg/dL (ref 8.9–10.3)
Chloride: 103 mmol/L (ref 98–111)
Creatinine, Ser: 0.66 mg/dL (ref 0.44–1.00)
GFR, Estimated: >60 mL/min (ref >60)
Glucose, Bld: 107 mg/dL — ABNORMAL HIGH (ref 70–99)
Potassium: 3.3 mmol/L — ABNORMAL LOW (ref 3.5–5.1)
Sodium: 139 mmol/L (ref 135–145)

## 2022-10-23 SURGERY — MANIPULATION, KNEE, CLOSED
Anesthesia: General | Site: Knee | Laterality: Right

## 2022-10-23 MED ORDER — OXYCODONE HCL 5 MG PO TABS
5.0000 mg | ORAL_TABLET | Freq: Once | ORAL | Status: AC | PRN
  Administered 2022-10-23: 5 mg via ORAL
  Filled 2022-10-23: qty 1

## 2022-10-23 MED ORDER — PROPOFOL 10 MG/ML IV BOLUS
INTRAVENOUS | Status: DC | PRN
  Administered 2022-10-23: 150 mg via INTRAVENOUS

## 2022-10-23 MED ORDER — LACTATED RINGERS IV SOLN: INTRAVENOUS | Status: DC

## 2022-10-23 MED ORDER — FENTANYL CITRATE (PF) 100 MCG/2ML IJ SOLN
INTRAMUSCULAR | Status: AC
  Filled 2022-10-23: qty 2

## 2022-10-23 MED ORDER — ONDANSETRON HCL 4 MG/2ML IJ SOLN
INTRAMUSCULAR | Status: DC | PRN
  Administered 2022-10-23: 4 mg via INTRAVENOUS

## 2022-10-23 MED ORDER — ORAL CARE MOUTH RINSE: 15.0000 mL | Freq: Once | OROMUCOSAL | Status: AC

## 2022-10-23 MED ORDER — LIDOCAINE HCL (PF) 2 % IJ SOLN
INTRAMUSCULAR | Status: AC
  Filled 2022-10-23: qty 5

## 2022-10-23 MED ORDER — OXYCODONE HCL 5 MG/5ML PO SOLN: 5.0000 mg | Freq: Once | ORAL | Status: AC | PRN

## 2022-10-23 MED ORDER — PROPOFOL 10 MG/ML IV BOLUS
INTRAVENOUS | Status: AC
  Filled 2022-10-23: qty 20

## 2022-10-23 MED ORDER — METHOCARBAMOL 500 MG PO TABS: 500.0000 mg | ORAL_TABLET | Freq: Four times a day (QID) | ORAL | 2 refills | Status: DC | PRN

## 2022-10-23 MED ORDER — CHLORHEXIDINE GLUCONATE 0.12 % MT SOLN
15.0000 mL | Freq: Once | OROMUCOSAL | Status: AC
  Administered 2022-10-23: 15 mL via OROMUCOSAL

## 2022-10-23 MED ORDER — CHLORHEXIDINE GLUCONATE 0.12 % MT SOLN
OROMUCOSAL | Status: AC
  Filled 2022-10-23: qty 15

## 2022-10-23 MED ORDER — FENTANYL CITRATE PF 50 MCG/ML IJ SOSY
25.0000 ug | PREFILLED_SYRINGE | INTRAMUSCULAR | Status: DC | PRN
  Administered 2022-10-23: 50 ug via INTRAVENOUS
  Filled 2022-10-23: qty 1

## 2022-10-23 MED ORDER — DEXAMETHASONE SODIUM PHOSPHATE 10 MG/ML IJ SOLN
INTRAMUSCULAR | Status: AC
  Filled 2022-10-23: qty 1

## 2022-10-23 MED ORDER — ONDANSETRON HCL 4 MG/2ML IJ SOLN: 4.0000 mg | Freq: Once | INTRAMUSCULAR | Status: DC | PRN

## 2022-10-23 MED ORDER — ONDANSETRON HCL 4 MG/2ML IJ SOLN
INTRAMUSCULAR | Status: AC
  Filled 2022-10-23: qty 2

## 2022-10-23 MED ORDER — FENTANYL CITRATE (PF) 100 MCG/2ML IJ SOLN
INTRAMUSCULAR | Status: DC | PRN
  Administered 2022-10-23: 50 ug via INTRAVENOUS

## 2022-10-23 MED ORDER — DEXMEDETOMIDINE HCL IN NACL 80 MCG/20ML IV SOLN
INTRAVENOUS | Status: DC | PRN
  Administered 2022-10-23: 8 ug via INTRAVENOUS

## 2022-10-23 SURGICAL SUPPLY — 2 items
KIT TURNOVER KIT A (KITS) ×2 IMPLANT
POSITIONER HEAD 8X9X4 ADT (SOFTGOODS) ×2 IMPLANT

## 2022-10-23 NOTE — Brief Op Note (Signed)
10/23/2022  8:59 AM  PATIENT:  Erica Graves  57 y.o. 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   Delay start of Pharmacological VTE agent (>24hrs) due to surgical blood loss or risk of bleeding: not applicable

## 2022-10-23 NOTE — Progress Notes (Signed)
Instructed on incentive spirometer. 2500 ml obtained. Tolerated well. 

## 2022-10-23 NOTE — Transfer of Care (Signed)
Immediate Anesthesia Transfer of Care Note  Patient: Erica Graves  Procedure(s) Performed: CLOSED MANIPULATION KNEE (Right: Knee)  Patient Location: PACU  Anesthesia Type:General  Level of Consciousness: drowsy and patient cooperative  Airway & Oxygen Therapy: Patient Spontanous Breathing and Patient connected to nasal cannula oxygen  Post-op Assessment: Report given to RN and Post -op Vital signs reviewed and stable  Post vital signs: Reviewed and stable  Last Vitals:  Vitals Value Taken Time  BP 119/71 10/23/22 0905  Temp 36.7 C 10/23/22 0905  Pulse 74 10/23/22 0906  Resp 15 10/23/22 0906  SpO2 99 % 10/23/22 0906  Vitals shown include unfiled device data.  Last Pain:  Vitals:   10/23/22 0821  PainSc: 0-No pain         Complications: No notable events documented.

## 2022-10-23 NOTE — Interval H&P Note (Signed)
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  The various methods of treatment have been discussed with the patient and family. After consideration of risks, benefits and other options for treatment, the patient has consented to  Procedure(s): CLOSED MANIPULATION KNEE (Right) as a surgical intervention.  The patient's history has been reviewed, patient examined, no change in status, stable for surgery.  I have reviewed the patient's chart and labs.  Questions were answered to the patient's satisfaction.     Fuller Canada

## 2022-10-23 NOTE — Brief Op Note (Signed)
10/23/2022  8:58 AM  PATIENT:  Erica Graves  57 y.o. 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   Delay start of Pharmacological VTE agent (>24hrs) due to surgical blood loss or risk of bleeding: not applicable

## 2022-10-23 NOTE — Op Note (Signed)
10/23/2022  8:59 AM  PATIENT:  Erica Graves  57 y.o. 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  The right knee was marked for surgery and the patient was taken to the operating room after general anesthesia with good relaxation timeout was completed  Passive flexion test was carried out and the knee range of motion again was 90 degrees.  After slow gentle pressure with my hands applied to the proximal tibia in my body against the leg and ear next to the knee gentle pressure was applied and obvious breaking up of adhesions were heard.  The knee was ranged several times and then patella mobilization was checked which was normal.  Several more gentle pushes on the knee were performed.  Passive flexion test after final manipulation was 105 degrees degrees and then assisted range of motion was 120 degrees   Anesthesia was reversed and the patient was taken recovery room in stable condition SURGEON:  Surgeons and Role:    * Vickki Hearing, MD - Primary  PHYSICIAN ASSISTANT:   ASSISTANTS: none   ANESTHESIA:   general  EBL:  N/A  BLOOD ADMINISTERED:none  DRAINS: none   LOCAL MEDICATIONS USED:  NONE  SPECIMEN:  No Specimen  DISPOSITION OF SPECIMEN:  N/A  COUNTS:  YES  TOURNIQUET:  * No tourniquets in log *  DICTATION: .Dragon Dictation  PLAN OF CARE: Discharge to home  after PACU  PATIENT DISPOSITION:  PACU - hemodynamically stable.   Delay start of Pharmacological VTE agent (>24hrs) due to surgical blood loss or risk of bleeding: not applicable   Postoperative plan 12 to 16 hours in the CPM machine resume physical therapy follow-up in a week

## 2022-10-23 NOTE — Anesthesia Procedure Notes (Signed)
Procedure Name: LMA Insertion Date/Time: 10/23/2022 8:54 AM  Performed by: Franco Nones, CRNAPre-anesthesia Checklist: Patient identified, Patient being monitored, Emergency Drugs available, Timeout performed and Suction available Patient Re-evaluated:Patient Re-evaluated prior to induction Oxygen Delivery Method: Circle System Utilized Preoxygenation: Pre-oxygenation with 100% oxygen Induction Type: IV induction Ventilation: Mask ventilation without difficulty LMA: LMA inserted LMA Size: 4.0 Number of attempts: 1 Placement Confirmation: positive ETCO2 and breath sounds checked- equal and bilateral Tube secured with: Tape Dental Injury: Teeth and Oropharynx as per pre-operative assessment

## 2022-10-23 NOTE — Anesthesia Preprocedure Evaluation (Signed)
Anesthesia Evaluation  Patient identified by MRN, date of birth, ID band Patient awake    Reviewed: Allergy & Precautions, H&P , NPO status , Patient's Chart, lab work & pertinent test results, reviewed documented beta blocker date and time   Airway Mallampati: II  TM Distance: >3 FB Neck ROM: full    Dental no notable dental hx.    Pulmonary neg pulmonary ROS   Pulmonary exam normal breath sounds clear to auscultation       Cardiovascular Exercise Tolerance: Good hypertension, negative cardio ROS  Rhythm:regular Rate:Normal     Neuro/Psych  Headaches PSYCHIATRIC DISORDERS Anxiety Depression     Neuromuscular disease negative neurological ROS  negative psych ROS   GI/Hepatic negative GI ROS, Neg liver ROS,GERD  ,,  Endo/Other  negative endocrine ROS    Renal/GU negative Renal ROS  negative genitourinary   Musculoskeletal   Abdominal   Peds  Hematology negative hematology ROS (+)   Anesthesia Other Findings   Reproductive/Obstetrics negative OB ROS                             Anesthesia Physical Anesthesia Plan  ASA: 2  Anesthesia Plan: General and General LMA   Post-op Pain Management:    Induction:   PONV Risk Score and Plan: Ondansetron  Airway Management Planned:   Additional Equipment:   Intra-op Plan:   Post-operative Plan:   Informed Consent: I have reviewed the patients History and Physical, chart, labs and discussed the procedure including the risks, benefits and alternatives for the proposed anesthesia with the patient or authorized representative who has indicated his/her understanding and acceptance.     Dental Advisory Given  Plan Discussed with: CRNA  Anesthesia Plan Comments:        Anesthesia Quick Evaluation

## 2022-10-24 ENCOUNTER — Encounter (HOSPITAL_COMMUNITY): Payer: Self-pay

## 2022-10-24 ENCOUNTER — Ambulatory Visit (HOSPITAL_COMMUNITY): Payer: BC Managed Care – PPO

## 2022-10-24 DIAGNOSIS — M25561 Pain in right knee: Secondary | ICD-10-CM | POA: Diagnosis not present

## 2022-10-24 DIAGNOSIS — M6281 Muscle weakness (generalized): Secondary | ICD-10-CM

## 2022-10-24 DIAGNOSIS — M25661 Stiffness of right knee, not elsewhere classified: Secondary | ICD-10-CM

## 2022-10-24 NOTE — Therapy (Signed)
OUTPATIENT PHYSICAL THERAPY LOWER EXTREMITY TREATMENT   Patient Name: Erica Graves MRN: [id number] DOB:[birth year], 57 y.o., female Today's Date: 10/24/2022   END OF SESSION:  END OF SESSION:   PT End of Session - 10/24/22 1042     Visit Number 9    Number of Visits 45    Date for PT Re-Evaluation 11/08/22    Authorization Type bc [id number]    Progress Note Due on Visit 10    PT Start Time 1045    PT Stop Time 1130    PT Time Calculation (min) 45 min    Activity Tolerance Patient tolerated treatment well;No increased pain    Behavior During Therapy [facility] for tasks assessed/performed             Past Medical History:  Diagnosis Date   Carpal tunnel syndrome, bilateral    Chronic low back pain    Constipation    Counseling for estrogen replacement therapy 07/07/2014   Depression with anxiety    Dyspareunia 07/07/2014   GAD (generalized anxiety disorder) 04/07/2020   History of kidney stones    Hot flashes 07/07/2014   Hypertension    Menopause 07/07/2014   Moody 07/07/2014   Obesity    Umbilical hernia    Past Surgical History:  Procedure Laterality Date   ABDOMINAL HYSTERECTOMY     BACK SURGERY     removal of cyst subdermal   CARPAL TUNNEL RELEASE Left 09/30/2014   Procedure: CARPAL TUNNEL RELEASE;  Surgeon: Vickki Hearing, MD;  Location: [facility];  Service: Orthopedics;  Laterality: Left;   CARPAL TUNNEL RELEASE Right 09/28/2015   Procedure: CARPAL TUNNEL RELEASE;  Surgeon: Vickki Hearing, MD;  Location: [facility];  Service: Orthopedics;  Laterality: Right;   COLONOSCOPY N/A 06/29/2013   Procedure: COLONOSCOPY;  Surgeon: West Bali, MD;  Location: [facility];  Service: Endoscopy;  Laterality: N/A;  1:30   ESOPHAGOGASTRODUODENOSCOPY (EGD) WITH PROPOFOL N/A 01/04/2022   Procedure: ESOPHAGOGASTRODUODENOSCOPY (EGD) WITH PROPOFOL;  Surgeon: Lanelle Bal, DO;  [facility]: [facility];  Service: Endoscopy;  Laterality: N/A;  12:30pm, asa 2   KNEE ARTHROSCOPY Right     KNEE ARTHROSCOPY  05/21/2011   Procedure: ARTHROSCOPY KNEE;  Surgeon: Vickki Hearing, MD;  Location: [facility];  Service: Orthopedics;  Laterality: Left;  lateral menisectomy   KNEE CLOSED REDUCTION Left 05/26/2012   Procedure: CLOSED MANIPULATION KNEE;  Surgeon: Vickki Hearing, MD;  Location: [facility];  Service: Orthopedics;  Laterality: Left;   left knee arthroscopy  08/14/2005 Dr. Romeo Apple torn meniscus   LUMBAR SPINE SURGERY     PARTIAL HYSTERECTOMY     TOTAL KNEE ARTHROPLASTY  03/31/2012   Procedure: TOTAL KNEE ARTHROPLASTY;  Surgeon: Vickki Hearing, MD;  Location: [facility];  Service: Orthopedics;  Laterality: Left;  Left Total Knee Arthroplasty   TOTAL KNEE ARTHROPLASTY Right 09/11/2022   Procedure: TOTAL KNEE ARTHROPLASTY;  Surgeon: Vickki Hearing, MD;  Location: [facility];  Service: Orthopedics;  Laterality: Right;   TRIGGER FINGER RELEASE Right 01/24/2017   Procedure: RELEASE TRIGGER FINGER/A-1 PULLEY right thumb;  Surgeon: Vickki Hearing, MD;  Location: [facility];  Service: Orthopedics;  Laterality: Right;   Patient Active Problem List   Diagnosis Date Noted   Adhesive capsulitis of right knee 10/23/2022   S/P total knee replacement, right 09/11/22 09/25/2022   Primary localized osteoarthritis of right knee 09/11/2022   Caregiver stress 07/06/2022   Abdominal pain, epigastric 12/11/2021  Chronic idiopathic constipation 11/10/2021   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   Arthritis of carpometacarpal (CMC) joint of right thumb 10/07/2020   Radial styloid tenosynovitis (de quervain) 10/07/2020   Irritable mood 04/07/2020   GAD (generalized anxiety disorder) 04/07/2020   Vaginal itching 06/05/2019   Screening for colorectal cancer 06/05/2019   Screening for cholesterol level 06/05/2019   S/P trigger finger release 01/24/17 02/06/2017   Trigger finger of right thumb    Essential hypertension, benign 11/18/2015   Gastroesophageal reflux disease without esophagitis 05/06/2015    Hot flashes 07/07/2014   Dyspareunia 07/07/2014   Moody 07/07/2014   Menopause 07/07/2014   Counseling for estrogen replacement therapy 07/07/2014   Morbid obesity (HCC) 03/05/2014   Carpal tunnel syndrome of right wrist 12/22/2013   Vitamin D deficiency 10/14/2013   Migraine headache without aura 11/24/2012   Muscle contraction headache 11/24/2012   CTS (carpal tunnel syndrome) 08/14/2012   Stiffness of joint, not elsewhere classified, lower leg 04/22/2012   Weakness of left leg 04/22/2012   Difficulty walking 04/22/2012   S/P total knee replacement, left 03/31/12 04/03/2012   Arthritis of knee, degenerative 03/05/2012   Osteoarthritis of left knee 12/12/2011   S/P arthroscopy of left knee 06/13/2011   Lateral meniscus tear 06/13/2011   Stiffness of knee joint 06/05/2011   Medial meniscus, posterior horn derangement 05/16/2011   DEGENERATIVE JOINT DISEASE, KNEES, BILATERAL 12/19/2009   PATELLO-FEMORAL SYNDROME 12/19/2009   FOOT PAIN, LEFT 07/22/2007   DEPRESSION/ANXIETY 12/27/2006   SHOULDER PAIN, LEFT 12/27/2006   OVERWEIGHT 11/12/2006   CONSTIPATION 11/12/2006   Lumbago 11/12/2006    PCP: Lilyan Punt   REFERRING PROVIDER: Vickki Hearing, MD  REFERRING DIAG: M17.11 (ICD-10-CM) - Unilateral primary osteoarthritis, right knee  THERAPY DIAG:  Right knee pain Right knee stiffness Muscle weakness Difficulty in walking.  Rationale for Evaluation and Treatment: Rehabilitation  ONSET DATE: 09/11/2022    SUBJECTIVE STATEMENT: Manipulation complete yesterday, reports she is sore today.  Pain scale 5/10 today.   PERTINENT HISTORY: Lt TKR, OA, back pain  PAIN:  Are you having pain? Yes: NPRS scale: 0/10; 5/10. Pain location: Rt knee  Pain description: throb Aggravating factors: trying to bend the knee  Relieving factors: CPM  PRECAUTIONS: None   WEIGHT BEARING RESTRICTIONS: WBAT  FALLS:  Has patient fallen in last 6 months? No  LIVING ENVIRONMENT: Lives  with: lives with their family Lives in: House/apartment Stairs: Yes: External: 4 steps; on right going up Has following equipment at home: Single point cane, Environmental consultant - 2 wheeled, and Crutches  OCCUPATIONOffice manager up and down   PLOF: Independent  PATIENT GOALS: Walk without assistive device, better strength   NEXT MD VISIT: 10/18/22  OBJECTIVE:    PATIENT SURVEYS:  FOTO 39  COGNITION: Overall cognitive status: Within functional limits for tasks assessed     SENSATION: WFL  EDEMA: WNL for surgery   LOWER EXTREMITY ROM:  Active ROM Right eval Left eval 10/02/22 AROM: 10/08/22: 10/10/22: 10/17/22 10/19/22 10/24/22 Right AROM  Hip flexion          Hip extension          Hip abduction          Hip adduction          Hip internal rotation          Hip external rotation          Knee flexion 50  82 88 90 95 AA97 100  104  Knee extension  Lacking 10 degrees   8 6 4   6   Ankle dorsiflexion          Ankle plantarflexion          Ankle inversion          Ankle eversion           (Blank rows = not tested)  LOWER EXTREMITY MMT:  MMT Right eval Left eval  Hip flexion 2   Hip extension    Hip abduction 3+   Hip adduction    Hip internal rotation    Hip external rotation    Knee flexion 2+   Knee extension 2+   Ankle dorsiflexion 5   Ankle plantarflexion    Ankle inversion    Ankle eversion     (Blank rows = not tested)   FUNCTIONAL TESTS:  30 seconds chair stand test: unable to come sit to stand without use of UE assist.  2 minute walk test: 226 ft with crutches  Single leg stance: RT: unable    TODAY'S TREATMENT:                                                                                                                              DATE:  10/24/22: Bike x 5 minutes rocking seat 15 -Rt knee drive on 2nd step (14") hold 5 x 30"   Prone: -contract/relax 5x 30" holds -quad stretch 3 x 1 min with rope -PROM flexion 3x 30" Supine: -SKTC with towel 3x  30" For flexoin -Heel slides with rope 3x 30" for flexion -PROM 3x 30" for flexoin -quad sets 10x Bike at EOS full revolution backwards then forwards seat 15 x 5 min    10/19/22 Bike x 5 minutes Knee driver x 5 x 30" Rt knee on 12 " stool hold x 30" x 3  12 Stool behind pt; pt placed Rt foot on stool, now bend Lt knee 5 x relax x 5 reps Prone Hamstring curls 10x 5" holds -contract/relax 5x 30" holds -quad stretch 3 x 1 min with rope Supine: Heel slide with end PR Sitting: Knee flexion at elevated position x 15 PROM  10/17/22 Heel raise x 15 Terminal extension x 10 Standing knee flexion x 10 Lunging onto 12" box to improve flexion 30" x 3 Bike x 5 minutes Prone Hamstring curls 10x 5" holds -contract/relax 5x 30" holds -quad stretch 3 x 1 min with rope Supine: Heel slide with end PR 10/15/22: Bike rocking seat 12 x 5' Standing: -Knee drive on 40JW step 11B 10" -Hamstring curls 10x 5"  Prone: -Hamstring curls 10x 5" holds -contract/relax 5x 30" holds -quad stretch 3 x 1 min with rope  Seated: -Contract/relax 5x 1' holds -Distraction with PROM 3x 30"  Supine: -Heel slides on therball 10x with long hold end range -SKTC with towel under knee 3x 1' -PROM 3x 1' -AROM 94 degrees flexion 10/12/22 Bike rocking seat 12 x 5' Standing: -  Knee drive on 16SA step 63K 10" -Hamstring curls 10x 5" -TKE GTB 10x5" -wall slide 10x 5"  Prone: -Hamstring curls 10x 5" holds -TKE 10x 5" -Quad stretch 3x 30" with rope -Contract/relax for flexion 5x 10" holds  Seated -PROM 3x 30" for flexion  Supine: -PROM 3x 30" for flexion AROM 3-90 degrees   10/10/22: Bike rocking seat 13 x 5' Standing: -Knee drive on 2nd step (16WF) 09N 10" -TKE 10x 5" with RTB Supine: Manual decongestive techniques for edema control with LE elevated Prone: -Hamstring curls 10x 5" holds -TKE 10x 5" -Quad st 3x 30" with rope -Contract/relax for flexion 5x 10" holds Supine: AROM 4-90  degrees   10/08/22: Bike rocking seat 13 Standing: -Knee drive on 23FT step 73U 10" -Heel raises 10x 5" -Toe raises decline slope 10x 5" -TKE 10x 5" with RTB -Sit to stand 10 no HHA -Gait no AD good mechanics  -Slant board 3x 30" -wall squat 5x 5" Prone: -hamstring curl 10x 5" -quad st 3x 30" Supine: -heel slide 10x  -quad sets -AROM 6-88 degrees -Hamstring stretch 3x 30" with rope Seated: -LAQ 10x  10/02/22: Reviewed goals Educated importance of HEP compliance for maximal benefits Supine:   -quad sets 10x 5"  - heel slide with strap 10x 5" holds at end range  -AROM 8-82 degrees  -SAQ 10x 5"  -Bridge 10x5  -SLR with quad set prior range 10x Sidelying:  -abduction Manual in supine with LE elevated:  -decongestive massage  -Scar tissue massage Ambulate with 1 crutch x 297ft with good mechanics Bike rocking seat 13  09/27/22 Eval - Seated Long Arc Quad   5- 3-5 seconds  hold - Supine Quadricep Sets  -10 reps - 5"  hold - Supine Heel Slide   5 reps - 3-5" hold - Supine Heel Slide with Strap  - 5 reps - 3-5" hold Side lying Rt hip abduction x 10 Prone knee flexion x 10    PATIENT EDUCATION:  Education details: HEP Person educated: Patient Education method: Explanation, Verbal cues, and Handouts Education comprehension: returned demonstration  HOME EXERCISE PROGRAM: Access Code: [email or web address] URL: [email or web address] Date: 09/27/2022 Prepared by: Virgina Organ  Exercises - Seated Long Arc Quad  - 6 x daily - 7 x weekly - 1 sets - 5-10 reps - 3-5 seconds  hold - Supine Quadricep Sets  - 3 x daily - 7 x weekly - 1 sets - 10 reps - 5"  hold - Supine Heel Slide  - 3 x daily - 7 x weekly - 1 sets - 5 reps - 3-5" hold - Supine Heel Slide with Strap  - 3 x daily - 7 x weekly - 1 sets - 5 reps - 3-5" hold Side lying abduction x 10  Access Code: [email or web address] URL: [email or web address] Date: 10/12/2022 Prepared by: Becky Sax  Exercises - Seated Long Arc Quad  - 6 x daily - 7 x weekly - 1 sets - 5-10 reps - 3-5 seconds  hold - Supine Quadricep Sets  - 3 x daily - 7 x weekly - 1 sets - 10 reps - 5"  hold - Supine Heel Slide  - 3 x daily - 7 x weekly - 1 sets - 5 reps - 3-5" hold - Supine Heel Slide with Strap  - 3 x daily - 7 x weekly - 1 sets - 5 reps - 3-5" hold - Sidelying Hip Abduction  - 3 x daily - 7 x weekly - 1 sets -  10 reps - 3-5" hold - Supine Bridge  - 3 x daily - 7 x weekly - 1 sets - 10 reps - 5" hold - Supine Knee Extension Strengthening  - 3 x daily - 7 x weekly - 1 sets - 10 reps - 5" hold - Straight Leg Raise  - 2 x daily - 7 x weekly - 1 sets - 10 reps ASSESSMENT:  CLINICAL IMPRESSION: Rt knee manipulation complete yesterday, session focus with aggressive ROM. AROM 6-104.  EOS pt able to make full revolution around bike, backwards initially then forwards.     OBJECTIVE IMPAIRMENTS: decreased activity tolerance, decreased balance, difficulty walking, decreased ROM, decreased strength, increased edema, and pain.   ACTIVITY LIMITATIONS: carrying, lifting, bending, sitting, standing, squatting, sleeping, stairs, and locomotion level  PARTICIPATION LIMITATIONS: cleaning, laundry, driving, shopping, community activity, and yard work   [facility] POTENTIAL: Good  CLINICAL DECISION MAKING: Stable/uncomplicated  EVALUATION COMPLEXITY: Low   GOALS: Goals reviewed with patient? No  SHORT TERM GOALS: Target date: 10/18/22 Pt to be I in her HEP in order to decrease her pain to no greater than a 1/10 Baseline: Goal status: IN PROGRESS  2.  Pt ROM to improve to  90  to allow increased comfort while sitting with her leg bent for  an hour. Baseline:  Goal status: IN PROGRESS  3.  Pt LE strength to be increased 1/2 grade to be able to walk with a cane.   Baseline:  Goal status: IN PROGRESS  LONG TERM GOALS: Target date: 11/08/22  Pt to be I in her HEP in order to increase her flexion to  115 to allow pt to squat down to the ground  Baseline:  Goal status: IN PROGRESS  2.  PT to be able to single leg stance on both LE for 20 " in order to reduce risk of falling  Baseline:  Goal status: IN PROGRESS  3.   Pt LE strength to be increased 1 grade to be able to walk without an assistive device and to be able to be up for 2 hr at a time to return to work. Baseline:  Goal status: IN PROGRESS  4.  PT to be able to go up and down 12 steps in a reciprocal manner  Baseline:  Goal status: IN PROGRESS      PLAN:  PT FREQUENCY: 2x/week  PT DURATION: 6 weeks  PLANNED INTERVENTIONS: Therapeutic exercises, Therapeutic activity, Balance training, Gait training, Patient/Family education, Self Care, Joint mobilization, and Manual therapy  PLAN FOR NEXT SESSION:  Aggressive ROM based therapy for flexion following manipulation on 10/23/22.  Becky Sax, LPTA/CLT; CBIS [phone]  Juel Burrow, PTA 10/24/2022, 11:32 AM   10/24/2022, 11:32 AM

## 2022-10-24 NOTE — Anesthesia Postprocedure Evaluation (Signed)
Anesthesia Post Note  Patient: Erica Graves  Procedure(s) Performed: CLOSED MANIPULATION KNEE (Right: Knee)  Patient location during evaluation: Phase II Anesthesia Type: General Level of consciousness: awake Pain management: pain level controlled Vital Signs Assessment: post-procedure vital signs reviewed and stable Respiratory status: spontaneous breathing and respiratory function stable Cardiovascular status: blood pressure returned to baseline and stable Postop Assessment: no headache and no apparent nausea or vomiting Anesthetic complications: no Comments: Late entry   No notable events documented.   Last Vitals:  Vitals:   10/23/22 0945 10/23/22 0957  BP: 128/79 (!) 147/85  Pulse: 62 (!) 59  Resp: 11 14  Temp:  36.4 C  SpO2: 96% 100%    Last Pain:  Vitals:   10/23/22 0957  PainSc: 7                  Windell Norfolk

## 2022-10-25 ENCOUNTER — Encounter (HOSPITAL_COMMUNITY): Payer: Self-pay

## 2022-10-25 ENCOUNTER — Ambulatory Visit (HOSPITAL_COMMUNITY): Payer: BC Managed Care – PPO

## 2022-10-25 DIAGNOSIS — M6281 Muscle weakness (generalized): Secondary | ICD-10-CM

## 2022-10-25 DIAGNOSIS — M25561 Pain in right knee: Secondary | ICD-10-CM | POA: Diagnosis not present

## 2022-10-25 DIAGNOSIS — M25661 Stiffness of right knee, not elsewhere classified: Secondary | ICD-10-CM

## 2022-10-25 NOTE — Therapy (Signed)
OUTPATIENT PHYSICAL THERAPY LOWER EXTREMITY TREATMENT   Patient Name: Erica Graves MRN: [id number] DOB:[birth year], 57 y.o., female Today's Date: 10/25/2022   END OF SESSION:  END OF SESSION:   PT End of Session - 10/25/22 0902     Visit Number 10    Number of Visits 45    Date for PT Re-Evaluation 11/08/22    Authorization Type bc [id number]    Progress Note Due on Visit 10    PT Start Time 0816    PT Stop Time 0902    PT Time Calculation (min) 46 min    Activity Tolerance Patient tolerated treatment well;No increased pain    Behavior During Therapy [facility] for tasks assessed/performed              Past Medical History:  Diagnosis Date   Carpal tunnel syndrome, bilateral    Chronic low back pain    Constipation    Counseling for estrogen replacement therapy 07/07/2014   Depression with anxiety    Dyspareunia 07/07/2014   GAD (generalized anxiety disorder) 04/07/2020   History of kidney stones    Hot flashes 07/07/2014   Hypertension    Menopause 07/07/2014   Moody 07/07/2014   Obesity    Umbilical hernia    Past Surgical History:  Procedure Laterality Date   ABDOMINAL HYSTERECTOMY     BACK SURGERY     removal of cyst subdermal   CARPAL TUNNEL RELEASE Left 09/30/2014   Procedure: CARPAL TUNNEL RELEASE;  Surgeon: Vickki Hearing, MD;  Location: [facility];  Service: Orthopedics;  Laterality: Left;   CARPAL TUNNEL RELEASE Right 09/28/2015   Procedure: CARPAL TUNNEL RELEASE;  Surgeon: Vickki Hearing, MD;  Location: [facility];  Service: Orthopedics;  Laterality: Right;   COLONOSCOPY N/A 06/29/2013   Procedure: COLONOSCOPY;  Surgeon: West Bali, MD;  Location: [facility];  Service: Endoscopy;  Laterality: N/A;  1:30   ESOPHAGOGASTRODUODENOSCOPY (EGD) WITH PROPOFOL N/A 01/04/2022   Procedure: ESOPHAGOGASTRODUODENOSCOPY (EGD) WITH PROPOFOL;  Surgeon: Lanelle Bal, DO;  Location: [facility];  Service: Endoscopy;  Laterality: N/A;  12:30pm, asa 2   KNEE ARTHROSCOPY Right     KNEE ARTHROSCOPY  05/21/2011   Procedure: ARTHROSCOPY KNEE;  Surgeon: Vickki Hearing, MD;  Location: [facility];  Service: Orthopedics;  Laterality: Left;  lateral menisectomy   KNEE CLOSED REDUCTION Left 05/26/2012   Procedure: CLOSED MANIPULATION KNEE;  Surgeon: Vickki Hearing, MD;  Location: [facility];  Service: Orthopedics;  Laterality: Left;   left knee arthroscopy  08/14/2005 Dr. Romeo Apple torn meniscus   LUMBAR SPINE SURGERY     PARTIAL HYSTERECTOMY     TOTAL KNEE ARTHROPLASTY  03/31/2012   Procedure: TOTAL KNEE ARTHROPLASTY;  Surgeon: Vickki Hearing, MD;  Location: [facility];  Service: Orthopedics;  Laterality: Left;  Left Total Knee Arthroplasty   TOTAL KNEE ARTHROPLASTY Right 09/11/2022   Procedure: TOTAL KNEE ARTHROPLASTY;  Surgeon: Vickki Hearing, MD;  Location: [facility];  Service: Orthopedics;  Laterality: Right;   TRIGGER FINGER RELEASE Right 01/24/2017   Procedure: RELEASE TRIGGER FINGER/A-1 PULLEY right thumb;  Surgeon: Vickki Hearing, MD;  Location: [facility];  Service: Orthopedics;  Laterality: Right;   Patient Active Problem List   Diagnosis Date Noted   Adhesive capsulitis of right knee 10/23/2022   S/P total knee replacement, right 09/11/22 09/25/2022   Primary localized osteoarthritis of right knee 09/11/2022   Caregiver stress 07/06/2022   Abdominal pain, epigastric 12/11/2021  Chronic idiopathic constipation 11/10/2021   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   Arthritis of carpometacarpal (CMC) joint of right thumb 10/07/2020   Radial styloid tenosynovitis (de quervain) 10/07/2020   Irritable mood 04/07/2020   GAD (generalized anxiety disorder) 04/07/2020   Vaginal itching 06/05/2019   Screening for colorectal cancer 06/05/2019   Screening for cholesterol level 06/05/2019   S/P trigger finger release 01/24/17 02/06/2017   Trigger finger of right thumb    Essential hypertension, benign 11/18/2015   Gastroesophageal reflux disease without esophagitis 05/06/2015    Hot flashes 07/07/2014   Dyspareunia 07/07/2014   Moody 07/07/2014   Menopause 07/07/2014   Counseling for estrogen replacement therapy 07/07/2014   Morbid obesity (HCC) 03/05/2014   Carpal tunnel syndrome of right wrist 12/22/2013   Vitamin D deficiency 10/14/2013   Migraine headache without aura 11/24/2012   Muscle contraction headache 11/24/2012   CTS (carpal tunnel syndrome) 08/14/2012   Stiffness of joint, not elsewhere classified, lower leg 04/22/2012   Weakness of left leg 04/22/2012   Difficulty walking 04/22/2012   S/P total knee replacement, left 03/31/12 04/03/2012   Arthritis of knee, degenerative 03/05/2012   Osteoarthritis of left knee 12/12/2011   S/P arthroscopy of left knee 06/13/2011   Lateral meniscus tear 06/13/2011   Stiffness of knee joint 06/05/2011   Medial meniscus, posterior horn derangement 05/16/2011   DEGENERATIVE JOINT DISEASE, KNEES, BILATERAL 12/19/2009   PATELLO-FEMORAL SYNDROME 12/19/2009   FOOT PAIN, LEFT 07/22/2007   DEPRESSION/ANXIETY 12/27/2006   SHOULDER PAIN, LEFT 12/27/2006   OVERWEIGHT 11/12/2006   CONSTIPATION 11/12/2006   Lumbago 11/12/2006    PCP: Lilyan Punt   REFERRING PROVIDER: Vickki Hearing, MD  REFERRING DIAG: M17.11 (ICD-10-CM) - Unilateral primary osteoarthritis, right knee  THERAPY DIAG:  Right knee pain Right knee stiffness Muscle weakness Difficulty in walking.  Rationale for Evaluation and Treatment: Rehabilitation  ONSET DATE: 09/11/2022    SUBJECTIVE STATEMENT: Feeling good today, no reports of pain.  Stated she had to take her mother to ER concerning her knee.    PERTINENT HISTORY: Lt TKR, OA, back pain  PAIN:  Are you having pain? Yes: NPRS scale: 0/10. Pain location: Rt knee  Pain description: throb Aggravating factors: trying to bend the knee  Relieving factors: CPM  PRECAUTIONS: None   WEIGHT BEARING RESTRICTIONS: WBAT  FALLS:  Has patient fallen in last 6 months? No  LIVING  ENVIRONMENT: Lives with: lives with their family Lives in: House/apartment Stairs: Yes: External: 4 steps; on right going up Has following equipment at home: Single point cane, Environmental consultant - 2 wheeled, and Crutches  OCCUPATIONOffice manager up and down   PLOF: Independent  PATIENT GOALS: Walk without assistive device, better strength   NEXT MD VISIT: 10/31/22  OBJECTIVE:    PATIENT SURVEYS:  FOTO 39  COGNITION: Overall cognitive status: Within functional limits for tasks assessed     SENSATION: WFL  EDEMA: WNL for surgery   LOWER EXTREMITY ROM:  Active ROM Right eval Left eval 10/02/22 AROM: 10/08/22: 10/10/22: 10/17/22 10/19/22 10/24/22 Right AROM  Hip flexion          Hip extension          Hip abduction          Hip adduction          Hip internal rotation          Hip external rotation          Knee flexion 50  82 88 90 95  AA97 100  104  Knee extension Lacking 10 degrees   8 6 4   6   Ankle dorsiflexion          Ankle plantarflexion          Ankle inversion          Ankle eversion           (Blank rows = not tested)  LOWER EXTREMITY MMT:  MMT Right eval Left eval  Hip flexion 2   Hip extension    Hip abduction 3+   Hip adduction    Hip internal rotation    Hip external rotation    Knee flexion 2+   Knee extension 2+   Ankle dorsiflexion 5   Ankle plantarflexion    Ankle inversion    Ankle eversion     (Blank rows = not tested)   FUNCTIONAL TESTS:  30 seconds chair stand test: unable to come sit to stand without use of UE assist.  2 minute walk test: 226 ft with crutches  Single leg stance: RT: unable    TODAY'S TREATMENT:                                                                                                                              DATE:  10/25/22: Bike x 5 minutes rocking seat 15 -Rt knee drive on 2nd step (14") hold 5 x 30"   Manual: Scar tissue massage and patella mobs Prone: -contract/relax 5x 30" holds -quad stretch 3 x  1 min with rope -PROM flexion 3x 30" Supine: Heel slide AROM 4-107 Bike at EOS full revolution backwards then forwards seat 15 x 5 min  10/24/22: Bike x 5 minutes rocking seat 15 -Rt knee drive on 2nd step (14") hold 5 x 30"   Prone: -contract/relax 5x 30" holds -quad stretch 3 x 1 min with rope -PROM flexion 3x 30" Supine: -SKTC with towel 3x 30" For flexoin -Heel slides with rope 3x 30" for flexion -PROM 3x 30" for flexoin -quad sets 10x Bike at EOS full revolution backwards then forwards seat 15 x 5 min    10/19/22 Bike x 5 minutes Knee driver x 5 x 30" Rt knee on 12 " stool hold x 30" x 3  12 Stool behind pt; pt placed Rt foot on stool, now bend Lt knee 5 x relax x 5 reps Prone Hamstring curls 10x 5" holds -contract/relax 5x 30" holds -quad stretch 3 x 1 min with rope Supine: Heel slide with end PR Sitting: Knee flexion at elevated position x 15 PROM  10/17/22 Heel raise x 15 Terminal extension x 10 Standing knee flexion x 10 Lunging onto 12" box to improve flexion 30" x 3 Bike x 5 minutes Prone Hamstring curls 10x 5" holds -contract/relax 5x 30" holds -quad stretch 3 x 1 min with rope Supine: Heel slide with end PR 10/15/22: Bike rocking seat 12 x 5' Standing: -Knee drive on 69GE  step 10x 10" -Hamstring curls 10x 5"  Prone: -Hamstring curls 10x 5" holds -contract/relax 5x 30" holds -quad stretch 3 x 1 min with rope  Seated: -Contract/relax 5x 1' holds -Distraction with PROM 3x 30"  Supine: -Heel slides on therball 10x with long hold end range -SKTC with towel under knee 3x 1' -PROM 3x 1' -AROM 94 degrees flexion 10/12/22 Bike rocking seat 12 x 5' Standing: -Knee drive on 62XB step 28U 10" -Hamstring curls 10x 5" -TKE GTB 10x5" -wall slide 10x 5"  Prone: -Hamstring curls 10x 5" holds -TKE 10x 5" -Quad stretch 3x 30" with rope -Contract/relax for flexion 5x 10" holds  Seated -PROM 3x 30" for flexion  Supine: -PROM 3x 30" for  flexion AROM 3-90 degrees   10/10/22: Bike rocking seat 13 x 5' Standing: -Knee drive on 2nd step (13KG) 40N 10" -TKE 10x 5" with RTB Supine: Manual decongestive techniques for edema control with LE elevated Prone: -Hamstring curls 10x 5" holds -TKE 10x 5" -Quad st 3x 30" with rope -Contract/relax for flexion 5x 10" holds Supine: AROM 4-90 degrees   10/08/22: Bike rocking seat 13 Standing: -Knee drive on 02VO step 53G 10" -Heel raises 10x 5" -Toe raises decline slope 10x 5" -TKE 10x 5" with RTB -Sit to stand 10 no HHA -Gait no AD good mechanics  -Slant board 3x 30" -wall squat 5x 5" Prone: -hamstring curl 10x 5" -quad st 3x 30" Supine: -heel slide 10x  -quad sets -AROM 6-88 degrees -Hamstring stretch 3x 30" with rope Seated: -LAQ 10x  10/02/22: Reviewed goals Educated importance of HEP compliance for maximal benefits Supine:   -quad sets 10x 5"  - heel slide with strap 10x 5" holds at end range  -AROM 8-82 degrees  -SAQ 10x 5"  -Bridge 10x5  -SLR with quad set prior range 10x Sidelying:  -abduction Manual in supine with LE elevated:  -decongestive massage  -Scar tissue massage Ambulate with 1 crutch x 281ft with good mechanics Bike rocking seat 13  09/27/22 Eval - Seated Long Arc Quad   5- 3-5 seconds  hold - Supine Quadricep Sets  -10 reps - 5"  hold - Supine Heel Slide   5 reps - 3-5" hold - Supine Heel Slide with Strap  - 5 reps - 3-5" hold Side lying Rt hip abduction x 10 Prone knee flexion x 10    PATIENT EDUCATION:  Education details: HEP Person educated: Patient Education method: Explanation, Verbal cues, and Handouts Education comprehension: returned demonstration  HOME EXERCISE PROGRAM: Access Code: [email or web address] URL: [email or web address] Date: 09/27/2022 Prepared by: Virgina Organ  Exercises - Seated Long Arc Quad  - 6 x daily - 7 x weekly - 1 sets - 5-10 reps - 3-5 seconds  hold - Supine Quadricep Sets  - 3 x daily -  7 x weekly - 1 sets - 10 reps - 5"  hold - Supine Heel Slide  - 3 x daily - 7 x weekly - 1 sets - 5 reps - 3-5" hold - Supine Heel Slide with Strap  - 3 x daily - 7 x weekly - 1 sets - 5 reps - 3-5" hold Side lying abduction x 10  Access Code: [email or web address] URL: [email or web address] Date: 10/12/2022 Prepared by: Becky Sax  Exercises - Seated Long Arc Quad  - 6 x daily - 7 x weekly - 1 sets - 5-10 reps - 3-5 seconds  hold - Supine Quadricep Sets  - 3 x daily - 7 x weekly - 1  sets - 10 reps - 5"  hold - Supine Heel Slide  - 3 x daily - 7 x weekly - 1 sets - 5 reps - 3-5" hold - Supine Heel Slide with Strap  - 3 x daily - 7 x weekly - 1 sets - 5 reps - 3-5" hold - Sidelying Hip Abduction  - 3 x daily - 7 x weekly - 1 sets - 10 reps - 3-5" hold - Supine Bridge  - 3 x daily - 7 x weekly - 1 sets - 10 reps - 5" hold - Supine Knee Extension Strengthening  - 3 x daily - 7 x weekly - 1 sets - 10 reps - 5" hold - Straight Leg Raise  - 2 x daily - 7 x weekly - 1 sets - 10 reps ASSESSMENT:  CLINICAL IMPRESSION: Continued session focus with aggressive PROM following manipulation.  Instructed patella mobs to add to self care.  Improved AROM 4-107.  Pt has met her STGs and progressing towards all LTGs.   OBJECTIVE IMPAIRMENTS: decreased activity tolerance, decreased balance, difficulty walking, decreased ROM, decreased strength, increased edema, and pain.   ACTIVITY LIMITATIONS: carrying, lifting, bending, sitting, standing, squatting, sleeping, stairs, and locomotion level  PARTICIPATION LIMITATIONS: cleaning, laundry, driving, shopping, community activity, and yard work   [facility] POTENTIAL: Good  CLINICAL DECISION MAKING: Stable/uncomplicated  EVALUATION COMPLEXITY: Low   GOALS: Goals reviewed with patient? No  SHORT TERM GOALS: Target date: 10/18/22 Pt to be I in her HEP in order to decrease her pain to no greater than a 1/10 Baseline:  10/25/22:  Reports compliance with  HEP, riding CPM following manipulation and self care techniques for scar tissue and instructed patella mobs on 10/25/22 Goal status: MET  2.  Pt ROM to improve to  90  to allow increased comfort while sitting with her leg bent for  an hour. Baseline: 10/25/22:  AROM 4-107 degrees Goal status: MET  3.  Pt LE strength to be increased 1/2 grade to be able to walk with a cane.   Baseline: 10/25/22:  MMT not complete this session as focus on ROM, currently ambulating without AD. Goal status: Partially MET  LONG TERM GOALS: Target date: 11/08/22  Pt to be I in her HEP in order to increase her flexion to 115 to allow pt to squat down to the ground  Baseline:  Goal status: IN PROGRESS  2.  PT to be able to single leg stance on both LE for 20 " in order to reduce risk of falling  Baseline:  Goal status: IN PROGRESS  3.   Pt LE strength to be increased 1 grade to be able to walk without an assistive device and to be able to be up for 2 hr at a time to return to work. Baseline:  Goal status: IN PROGRESS  4.  PT to be able to go up and down 12 steps in a reciprocal manner  Baseline:  Goal status: IN PROGRESS      PLAN:  PT FREQUENCY: 2x/week  PT DURATION: 6 weeks  PLANNED INTERVENTIONS: Therapeutic exercises, Therapeutic activity, Balance training, Gait training, Patient/Family education, Self Care, Joint mobilization, and Manual therapy  PLAN FOR NEXT SESSION:  Aggressive ROM based therapy for flexion following manipulation on 10/23/22.  Becky Sax, LPTA/CLT; CBIS [phone]  Juel Burrow, PTA 10/25/2022, 9:06 AM   10/25/2022, 9:06 AM

## 2022-10-26 ENCOUNTER — Ambulatory Visit (HOSPITAL_COMMUNITY): Payer: BC Managed Care – PPO | Admitting: Physical Therapy

## 2022-10-26 DIAGNOSIS — M25561 Pain in right knee: Secondary | ICD-10-CM | POA: Diagnosis not present

## 2022-10-26 DIAGNOSIS — M25661 Stiffness of right knee, not elsewhere classified: Secondary | ICD-10-CM

## 2022-10-26 DIAGNOSIS — M6281 Muscle weakness (generalized): Secondary | ICD-10-CM

## 2022-10-26 NOTE — Therapy (Signed)
OUTPATIENT PHYSICAL THERAPY LOWER EXTREMITY TREATMENT/Progress    Patient Name: Erica Graves MRN: [id number] DOB:June 10, 1965, 57 y.o., female Today's Date: 10/26/2022  Progress Note Reporting Period 09/27/22 to 10/26/22  See note below for Objective Data and Assessment of Progress/Goals.     END OF SESSION:   PT End of Session - 10/26/22 1119     Visit Number 11    Number of Visits 45    Date for PT Re-Evaluation 11/08/22    Authorization Type bc bs    Progress Note Due on Visit 21    PT Start Time 1110    PT Stop Time 1200    PT Time Calculation (min) 50 min    Activity Tolerance Patient tolerated treatment well;No increased pain    Behavior During Therapy [facility] for tasks assessed/performed              Past Medical History:  Diagnosis Date   Carpal tunnel syndrome, bilateral    Chronic low back pain    Constipation    Counseling for estrogen replacement therapy 07/07/2014   Depression with anxiety    Dyspareunia 07/07/2014   GAD (generalized anxiety disorder) 04/07/2020   History of kidney stones    Hot flashes 07/07/2014   Hypertension    Menopause 07/07/2014   Moody 07/07/2014   Obesity    Umbilical hernia    Past Surgical History:  Procedure Laterality Date   ABDOMINAL HYSTERECTOMY     BACK SURGERY     removal of cyst subdermal   CARPAL TUNNEL RELEASE Left 09/30/2014   Procedure: CARPAL TUNNEL RELEASE;  Surgeon: Vickki Hearing, MD;  Location: [facility];  Service: Orthopedics;  Laterality: Left;   CARPAL TUNNEL RELEASE Right 09/28/2015   Procedure: CARPAL TUNNEL RELEASE;  Surgeon: Vickki Hearing, MD;  Location: [facility];  Service: Orthopedics;  Laterality: Right;   COLONOSCOPY N/A 06/29/2013   Procedure: COLONOSCOPY;  Surgeon: West Bali, MD;  Location: [facility];  Service: Endoscopy;  Laterality: N/A;  1:30   ESOPHAGOGASTRODUODENOSCOPY (EGD) WITH PROPOFOL N/A 01/04/2022   Procedure: ESOPHAGOGASTRODUODENOSCOPY (EGD) WITH PROPOFOL;  Surgeon: Lanelle Bal, DO;  Location: [facility];  Service: Endoscopy;  Laterality: N/A;  12:30pm, asa 2   KNEE ARTHROSCOPY Right    KNEE ARTHROSCOPY  05/21/2011   Procedure: ARTHROSCOPY KNEE;  Surgeon: Vickki Hearing, MD;  Location: [facility];  Service: Orthopedics;  Laterality: Left;  lateral menisectomy   KNEE CLOSED REDUCTION Left 05/26/2012   Procedure: CLOSED MANIPULATION KNEE;  Surgeon: Vickki Hearing, MD;  Location: [facility];  Service: Orthopedics;  Laterality: Left;   left knee arthroscopy  08/14/2005 Dr. Romeo Apple torn meniscus   LUMBAR SPINE SURGERY     PARTIAL HYSTERECTOMY     TOTAL KNEE ARTHROPLASTY  03/31/2012   Procedure: TOTAL KNEE ARTHROPLASTY;  Surgeon: Vickki Hearing, MD;  Location: [facility];  Service: Orthopedics;  Laterality: Left;  Left Total Knee Arthroplasty   TOTAL KNEE ARTHROPLASTY Right 09/11/2022   Procedure: TOTAL KNEE ARTHROPLASTY;  Surgeon: Vickki Hearing, MD;  Location: [facility];  Service: Orthopedics;  Laterality: Right;   TRIGGER FINGER RELEASE Right 01/24/2017   Procedure: RELEASE TRIGGER FINGER/A-1 PULLEY right thumb;  Surgeon: Vickki Hearing, MD;  Location: [facility];  Service: Orthopedics;  Laterality: Right;   Patient Active Problem List   Diagnosis Date Noted   Adhesive capsulitis of right knee 10/23/2022   S/P total knee replacement, right 09/11/22 09/25/2022   Primary  localized osteoarthritis of right knee 09/11/2022   Caregiver stress 07/06/2022   Abdominal pain, epigastric 12/11/2021   Chronic idiopathic constipation 11/10/2021   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   Arthritis of carpometacarpal (CMC) joint of right thumb 10/07/2020   Radial styloid tenosynovitis (de quervain) 10/07/2020   Irritable mood 04/07/2020   GAD (generalized anxiety disorder) 04/07/2020   Vaginal itching 06/05/2019   Screening for colorectal cancer 06/05/2019   Screening for cholesterol level 06/05/2019   S/P trigger finger release 01/24/17 02/06/2017   Trigger finger of  right thumb    Essential hypertension, benign 11/18/2015   Gastroesophageal reflux disease without esophagitis 05/06/2015   Hot flashes 07/07/2014   Dyspareunia 07/07/2014   Moody 07/07/2014   Menopause 07/07/2014   Counseling for estrogen replacement therapy 07/07/2014   Morbid obesity (HCC) 03/05/2014   Carpal tunnel syndrome of right wrist 12/22/2013   Vitamin D deficiency 10/14/2013   Migraine headache without aura 11/24/2012   Muscle contraction headache 11/24/2012   CTS (carpal tunnel syndrome) 08/14/2012   Stiffness of joint, not elsewhere classified, lower leg 04/22/2012   Weakness of left leg 04/22/2012   Difficulty walking 04/22/2012   S/P total knee replacement, left 03/31/12 04/03/2012   Arthritis of knee, degenerative 03/05/2012   Osteoarthritis of left knee 12/12/2011   S/P arthroscopy of left knee 06/13/2011   Lateral meniscus tear 06/13/2011   Stiffness of knee joint 06/05/2011   Medial meniscus, posterior horn derangement 05/16/2011   DEGENERATIVE JOINT DISEASE, KNEES, BILATERAL 12/19/2009   PATELLO-FEMORAL SYNDROME 12/19/2009   FOOT PAIN, LEFT 07/22/2007   DEPRESSION/ANXIETY 12/27/2006   SHOULDER PAIN, LEFT 12/27/2006   OVERWEIGHT 11/12/2006   CONSTIPATION 11/12/2006   Lumbago 11/12/2006    PCP: Lilyan Punt   REFERRING PROVIDER: Vickki Hearing, MD  REFERRING DIAG: M17.11 (ICD-10-CM) - Unilateral primary osteoarthritis, right knee  THERAPY DIAG:  Right knee pain Right knee stiffness Muscle weakness Difficulty in walking.  Rationale for Evaluation and Treatment: Rehabilitation  ONSET DATE: 09/11/2022    SUBJECTIVE STATEMENT: Pt states that the soreness from the manipulation has worn off    PERTINENT HISTORY: Lt TKR, OA, back pain  PAIN:  Are you having pain? Yes: NPRS scale: 5/10. Pain location: Rt knee  Pain description: throb Aggravating factors: trying to bend the knee  Relieving factors: CPM  PRECAUTIONS: None   WEIGHT BEARING  RESTRICTIONS: WBAT  FALLS:  Has patient fallen in last 6 months? No  LIVING ENVIRONMENT: Lives with: lives with their family Lives in: House/apartment Stairs: Yes: External: 4 steps; on right going up Has following equipment at home: Single point cane, Walker - 2 wheeled, and Crutches  OCCUPATIONOffice manager up and down   PLOF: Independent  PATIENT GOALS: Walk without assistive device, better strength   NEXT MD VISIT: 10/31/22  OBJECTIVE:    PATIENT SURVEYS:  FOTO 39  COGNITION: Overall cognitive status: Within functional limits for tasks assessed     SENSATION: WFL  EDEMA: WNL for surgery   LOWER EXTREMITY ROM:  Active ROM Right eval Left eval 10/02/22 AROM: 10/08/22: 10/10/22: 10/17/22 10/19/22 10/24/22 Right AROM 10/26/22  Hip flexion           Hip extension           Hip abduction           Hip adduction           Hip internal rotation           Hip external rotation  Knee flexion 50  82 88 90 95 AA97 100  104 110  Knee extension Lacking 10 degrees   8 6 4   6 4   Ankle dorsiflexion           Ankle plantarflexion           Ankle inversion           Ankle eversion            (Blank rows = not tested)  LOWER EXTREMITY MMT:  MMT Right eval Left eval  Hip flexion 2   Hip extension    Hip abduction 3+   Hip adduction    Hip internal rotation    Hip external rotation    Knee flexion 2+   Knee extension 2+   Ankle dorsiflexion 5   Ankle plantarflexion    Ankle inversion    Ankle eversion     (Blank rows = not tested)   FUNCTIONAL TESTS:  09/27/22 :  30 seconds chair stand test: unable to come sit to stand without use of UE assist.  10/26/22:  30 second sit to stand 7; ( 9 is poor for pt age and sex)   09/27/22: 2 minute walk test: 226 ft with crutches ,  10/26/22: 2 minute walk test: no assistive device 242 feet.  09/27/22: Single leg stance: RT: unable ; 10/26/22 : 60"  TODAY'S TREATMENT:                                                                                                                               DATE:  10/26/22 Reassess see above :  Bike with full backward revolution on 14 Rt knee driver 3x hold 30" Foot on 12" stool behind pt, flexion off of the stool x 10 Heel raise x 15  10/25/22: Bike x 5 minutes rocking seat 15 -Rt knee drive on 2nd step (14") hold 5 x 30"   Manual: Scar tissue massage and patella mobs Prone: -contract/relax 5x 30" holds -quad stretch 3 x 1 min with rope -PROM flexion 3x 30" Supine: Heel slide AROM 4-107 Bike at EOS full revolution backwards then forwards seat 15 x 5 min  10/24/22: Bike x 5 minutes rocking seat 15 -Rt knee drive on 2nd step (14") hold 5 x 30"   Prone: -contract/relax 5x 30" holds -quad stretch 3 x 1 min with rope -PROM flexion 3x 30" Supine: -SKTC with towel 3x 30" For flexoin -Heel slides with rope 3x 30" for flexion -PROM 3x 30" for flexoin -quad sets 10x Bike at EOS full revolution backwards then forwards seat 15 x 5 min    10/19/22 Bike x 5 minutes Knee driver x 5 x 30" Rt knee on 12 " stool hold x 30" x 3  12 Stool behind pt; pt placed Rt foot on stool, now bend Lt knee 5 x relax x 5 reps Prone Hamstring curls 10x 5" holds -contract/relax 5x 30"  holds -quad stretch 3 x 1 min with rope Supine: Heel slide with end PR Sitting: Knee flexion at elevated position x 15 PROM   PATIENT EDUCATION:  Education details: HEP Person educated: Patient Education method: Programmer, multimedia, Verbal cues, and Handouts Education comprehension: returned demonstration  HOME EXERCISE PROGRAM: Access Code: [email or web address] URL: [email or web address] Date: 09/27/2022 Prepared by: Virgina Organ  Exercises - Seated Long Arc Quad  - 6 x daily - 7 x weekly - 1 sets - 5-10 reps - 3-5 seconds  hold - Supine Quadricep Sets  - 3 x daily - 7 x weekly - 1 sets - 10 reps - 5"  hold - Supine Heel Slide  - 3 x daily - 7 x weekly - 1 sets - 5 reps - 3-5" hold -  Supine Heel Slide with Strap  - 3 x daily - 7 x weekly - 1 sets - 5 reps - 3-5" hold Side lying abduction x 10  Access Code: [email or web address] URL: [email or web address] Date: 10/12/2022 Prepared by: Becky Sax  Exercises - Seated Long Arc Quad  - 6 x daily - 7 x weekly - 1 sets - 5-10 reps - 3-5 seconds  hold - Supine Quadricep Sets  - 3 x daily - 7 x weekly - 1 sets - 10 reps - 5"  hold - Supine Heel Slide  - 3 x daily - 7 x weekly - 1 sets - 5 reps - 3-5" hold - Supine Heel Slide with Strap  - 3 x daily - 7 x weekly - 1 sets - 5 reps - 3-5" hold - Sidelying Hip Abduction  - 3 x daily - 7 x weekly - 1 sets - 10 reps - 3-5" hold - Supine Bridge  - 3 x daily - 7 x weekly - 1 sets - 10 reps - 5" hold - Supine Knee Extension Strengthening  - 3 x daily - 7 x weekly - 1 sets - 10 reps - 5" hold - Straight Leg Raise  - 2 x daily - 7 x weekly - 1 sets - 10 reps ASSESSMENT:  CLINICAL IMPRESSION: Continued session focus with aggressive PROM following manipulation.  Instructed patella mobs to add to self care.  Improved AROM 4-110.  Pt has met her STGs and progressing towards all LTGs.   OBJECTIVE IMPAIRMENTS: decreased activity tolerance, decreased balance, difficulty walking, decreased ROM, decreased strength, increased edema, and pain.   ACTIVITY LIMITATIONS: carrying, lifting, bending, sitting, standing, squatting, sleeping, stairs, and locomotion level  PARTICIPATION LIMITATIONS: cleaning, laundry, driving, shopping, community activity, and yard work   [facility] POTENTIAL: Good  CLINICAL DECISION MAKING: Stable/uncomplicated  EVALUATION COMPLEXITY: Low   GOALS: Goals reviewed with patient? No  SHORT TERM GOALS: Target date: 10/18/22 Pt to be I in her HEP in order to decrease her pain to no greater than a 1/10 Baseline:  10/25/22:  Reports compliance with HEP, riding CPM following manipulation and self care techniques for scar tissue and instructed patella mobs on  10/25/22 Goal status: MET  2.  Pt ROM to improve to  90  to allow increased comfort while sitting with her leg bent for  an hour. Baseline: 10/25/22:  AROM 4-107 degrees Goal status: MET  3.  Pt LE strength to be increased 1/2 grade to be able to walk with a cane.   Baseline: 10/25/22:  MMT not complete this session as focus on ROM, currently ambulating without AD. Goal status:  MET  LONG TERM GOALS: Target date: 11/08/22  Pt to  be I in her HEP in order to increase her flexion to 115 to allow pt to squat down to the ground  Baseline:  Goal status: IN PROGRESS  2.  PT to be able to single leg stance on both LE for 20 " in order to reduce risk of falling  Baseline:  Goal status: met  3.   Pt LE strength to be increased 1 grade to be able to walk without an assistive device and to be able to be up for 2 hr at a time to return to work. Baseline:  Goal status: IN PROGRESS  4.  PT to be able to go up and down 12 steps in a reciprocal manner  Baseline:  Goal status: IN PROGRESS      PLAN:  PT FREQUENCY: 2x/week  PT DURATION: 6 weeks  PLANNED INTERVENTIONS: Therapeutic exercises, Therapeutic activity, Balance training, Gait training, Patient/Family education, Self Care, Joint mobilization, and Manual therapy  PLAN FOR NEXT SESSION:  Aggressive ROM based therapy for flexion following manipulation .  Virgina Organ, PT CLT [phone]  10/26/2022, 11:58 AM

## 2022-10-30 ENCOUNTER — Encounter (HOSPITAL_COMMUNITY): Payer: BC Managed Care – PPO

## 2022-10-31 ENCOUNTER — Ambulatory Visit (INDEPENDENT_AMBULATORY_CARE_PROVIDER_SITE_OTHER): Payer: BC Managed Care – PPO | Admitting: Orthopedic Surgery

## 2022-10-31 ENCOUNTER — Encounter: Payer: Self-pay | Admitting: Orthopedic Surgery

## 2022-10-31 ENCOUNTER — Encounter (HOSPITAL_COMMUNITY): Payer: BC Managed Care – PPO | Admitting: Physical Therapy

## 2022-10-31 DIAGNOSIS — Z96651 Presence of right artificial knee joint: Secondary | ICD-10-CM

## 2022-10-31 NOTE — Progress Notes (Signed)
Chief Complaint  Patient presents with   Follow-up    Recheck on right knee TKA, DOS 09-11-22.; 8/27/MUA     VISIT TYPE: POST OP VISIT   Chief Complaint  Patient presents with   Follow-up    Recheck on right knee TKA, DOS 09-11-22.    Encounter Diagnosis  Name Primary?   S/P total knee replacement, right 09/11/22 MUA 10/23/22 Yes    PROCEDURE: Manipulation under anesthesia status post right total knee, see media pictures for improvement in range of motion  DATE OF SURGERY: October 23, 2022  POV # 1   Current Outpatient Medications:    amLODipine (NORVASC) 5 MG tablet, Take 1 tablet (5 mg total) by mouth daily., Disp: 90 tablet, Rfl: 1   APPLE CIDER VINEGAR PO, Take 1 capsule by mouth daily., Disp: , Rfl:    aspirin 81 MG chewable tablet, Chew 1 tablet (81 mg total) by mouth 2 (two) times daily., Disp: 90 tablet, Rfl: 0   cholecalciferol (VITAMIN D3) 25 MCG (1000 UNIT) tablet, Take 1,000 Units by mouth daily., Disp: , Rfl:    indapamide (LOZOL) 1.25 MG tablet, Take 1.25 mg by mouth daily., Disp: , Rfl:    methocarbamol (ROBAXIN) 500 MG tablet, Take 1 tablet (500 mg total) by mouth every 6 (six) hours as needed for muscle spasms., Disp: 60 tablet, Rfl: 2   Multiple Vitamin (MULTIVITAMIN WITH MINERALS) TABS tablet, Take 1 tablet by mouth daily., Disp: , Rfl:    naproxen (NAPROSYN) 500 MG tablet, Take 1 tablet (500 mg total) by mouth 2 (two) times daily., Disp: 30 tablet, Rfl: 0   ondansetron (ZOFRAN) 4 MG tablet, Take 1 tablet (4 mg total) by mouth every 4 (four) hours as needed for nausea or vomiting., Disp: 30 tablet, Rfl: 2   pantoprazole (PROTONIX) 40 MG tablet, Take 40 mg by mouth daily., Disp: , Rfl:    polyethylene glycol (MIRALAX / GLYCOLAX) 17 g packet, Take 17 g by mouth daily as needed for mild constipation., Disp: 14 each, Rfl: 0   tamsulosin (FLOMAX) 0.4 MG CAPS capsule, Take 1 capsule (0.4 mg total) by mouth daily after supper., Disp: 30 capsule, Rfl: 0  Current  Facility-Administered Medications:    bupivacaine-meloxicam ER (ZYNRELEF) injection 400 mg, 400 mg, Infiltration, Once, Vickki Hearing, MD   Subjective   Pain well-controlled  Range of motion in therapy 110  ASSESSMENT AND PLAN   May decrease therapy to 2-3 times a week and stop therapy when she is comfortable with home exercises  May return to work tomorrow  Follow-up in 4 weeks

## 2022-10-31 NOTE — Patient Instructions (Signed)
Return to work tomorrow

## 2022-11-01 ENCOUNTER — Encounter (HOSPITAL_COMMUNITY): Payer: BC Managed Care – PPO | Admitting: Physical Therapy

## 2022-11-02 ENCOUNTER — Encounter (HOSPITAL_COMMUNITY): Payer: Self-pay | Admitting: Orthopedic Surgery

## 2022-11-02 ENCOUNTER — Encounter (HOSPITAL_COMMUNITY): Payer: BC Managed Care – PPO

## 2022-11-05 ENCOUNTER — Encounter (HOSPITAL_COMMUNITY): Payer: BC Managed Care – PPO

## 2022-11-07 ENCOUNTER — Encounter (HOSPITAL_COMMUNITY): Payer: BC Managed Care – PPO

## 2022-11-09 ENCOUNTER — Encounter: Payer: BC Managed Care – PPO | Admitting: Orthopedic Surgery

## 2022-11-09 ENCOUNTER — Encounter (HOSPITAL_COMMUNITY): Payer: BC Managed Care – PPO | Admitting: Physical Therapy

## 2022-11-29 ENCOUNTER — Encounter: Payer: Self-pay | Admitting: Orthopedic Surgery

## 2022-11-29 ENCOUNTER — Ambulatory Visit (INDEPENDENT_AMBULATORY_CARE_PROVIDER_SITE_OTHER): Payer: BC Managed Care – PPO | Admitting: Orthopedic Surgery

## 2022-11-29 DIAGNOSIS — Z96651 Presence of right artificial knee joint: Secondary | ICD-10-CM

## 2022-11-29 NOTE — Progress Notes (Signed)
   VISIT TYPE: POST OP VISIT   Chief Complaint  Patient presents with   Post-op Follow-up    Right knee replaced 09/11/22 MUA 10/23/22 improving     Encounter Diagnosis  Name Primary?   S/P total knee replacement, right 09/11/22 MUA 10/23/22 Yes   Doing well   No pain at present  MUA ROM maintained 0-110   Fu July xrays annual both knees tka    Current Outpatient Medications:    amLODipine (NORVASC) 5 MG tablet, Take 1 tablet (5 mg total) by mouth daily., Disp: 90 tablet, Rfl: 1   APPLE CIDER VINEGAR PO, Take 1 capsule by mouth daily., Disp: , Rfl:    aspirin 81 MG chewable tablet, Chew 1 tablet (81 mg total) by mouth 2 (two) times daily., Disp: 90 tablet, Rfl: 0   cholecalciferol (VITAMIN D3) 25 MCG (1000 UNIT) tablet, Take 1,000 Units by mouth daily., Disp: , Rfl:    indapamide (LOZOL) 1.25 MG tablet, Take 1.25 mg by mouth daily., Disp: , Rfl:    methocarbamol (ROBAXIN) 500 MG tablet, Take 1 tablet (500 mg total) by mouth every 6 (six) hours as needed for muscle spasms., Disp: 60 tablet, Rfl: 2   Multiple Vitamin (MULTIVITAMIN WITH MINERALS) TABS tablet, Take 1 tablet by mouth daily., Disp: , Rfl:    naproxen (NAPROSYN) 500 MG tablet, Take 1 tablet (500 mg total) by mouth 2 (two) times daily., Disp: 30 tablet, Rfl: 0   pantoprazole (PROTONIX) 40 MG tablet, Take 40 mg by mouth daily., Disp: , Rfl:    tamsulosin (FLOMAX) 0.4 MG CAPS capsule, Take 1 capsule (0.4 mg total) by mouth daily after supper., Disp: 30 capsule, Rfl: 0  Current Facility-Administered Medications:    bupivacaine-meloxicam ER (ZYNRELEF) injection 400 mg, 400 mg, Infiltration, Once, Vickki Hearing, MD   Subjective   ASSESSMENT AND PLAN   Fu both knees 08/2023

## 2022-12-24 ENCOUNTER — Telehealth: Payer: Self-pay | Admitting: Radiology

## 2022-12-24 MED ORDER — CEPHALEXIN 500 MG PO CAPS: 2000.0000 mg | ORAL_CAPSULE | Freq: Once | ORAL | 2 refills | Status: AC

## 2022-12-24 NOTE — Telephone Encounter (Signed)
Sent in per protocol  

## 2022-12-24 NOTE — Addendum Note (Signed)
Addended byCaffie Damme on: 12/24/2022 10:18 AM   Modules accepted: Orders

## 2022-12-24 NOTE — Telephone Encounter (Signed)
Patient called, has a dentist appt TODAY, needs abx for cleaning.  Please send in to [facility]?  Thanks

## 2023-01-28 ENCOUNTER — Telehealth: Payer: Self-pay

## 2023-01-28 NOTE — Telephone Encounter (Signed)
Nurses-please explain to the patient that indapamide was prescribed by orthopedist that appears If patient desires to stay with this medication we will need to see her Try to clarify why what she take it in the mind on a regular basis?  If we do prescribe the indapamide we will also have to be on potassium to prevent low potassium Please talk with patient then feel free to let me know thank you

## 2023-01-28 NOTE — Telephone Encounter (Signed)
Prescription Request  01/28/2023  LOV: Visit date not found  What is the name of the medication or equipment? indapamide (LOZOL) 1.25 MG tablet   Have you contacted your pharmacy to request a refill? Yes   Which pharmacy would you like this sent to?  [id number] - [place], [place] - [address] [place] [address] Phone: [phone] Fax: [phone]    Patient notified that their request is being sent to the clinical staff for review and that they should receive a response within 2 business days.   Please advise at Mobile [phone] (mobile)

## 2023-01-29 NOTE — Telephone Encounter (Signed)
Please send message to patient that with her getting her into provide through orthopedics we would recommend that they periodically check her kidney functions to make sure her potassium does not go low

## 2023-01-29 NOTE — Telephone Encounter (Signed)
Patient stated she got her refills on the medication from her orthopedic doctor and to disregard the request

## 2023-01-30 ENCOUNTER — Encounter: Payer: Self-pay | Admitting: *Deleted

## 2023-01-30 NOTE — Telephone Encounter (Signed)
 Patient notified of provider's recommendations via my chart

## 2023-02-13 ENCOUNTER — Encounter (HOSPITAL_COMMUNITY): Payer: Self-pay

## 2023-02-13 NOTE — Therapy (Signed)
[facility] Outpatient Rehabilitation at [facility] [address] [place], [place], [address] Phone: [phone]   Fax:  [phone]  Patient Details  Name: Erica Graves MRN: [id number] Date of Birth: Jul 05, 1965 Referring Provider:  No ref. provider found  Encounter Date: 02/13/2023  PHYSICAL THERAPY DISCHARGE SUMMARY  Visits from Start of Care: 11  Current functional level related to goals / functional outcomes: Unknown   Remaining deficits: Unknown   Education / Equipment: NA   Patient is being discharged from physical therapy services. Patient goals were not met. Patient is being discharged due to not returning since the last visit.  Nelida Meuse, PT 02/13/2023, 3:11 PM  [facility] Outpatient Rehabilitation at [facility] [address] [place], [place], [address] Phone: [phone]   Fax:  [phone]

## 2023-04-02 ENCOUNTER — Ambulatory Visit: Payer: 59 | Admitting: Nurse Practitioner

## 2023-04-02 VITALS — BP 123/78 | HR 71 | Temp 97.6°F | Wt 206.0 lb

## 2023-04-02 DIAGNOSIS — M5441 Lumbago with sciatica, right side: Secondary | ICD-10-CM | POA: Diagnosis not present

## 2023-04-02 DIAGNOSIS — M545 Low back pain, unspecified: Secondary | ICD-10-CM

## 2023-04-02 MED ORDER — NAPROXEN 500 MG PO TABS: 500.0000 mg | ORAL_TABLET | Freq: Two times a day (BID) | ORAL | 0 refills | Status: AC

## 2023-04-02 MED ORDER — METHOCARBAMOL 500 MG PO TABS: 500.0000 mg | ORAL_TABLET | Freq: Three times a day (TID) | ORAL | 0 refills | Status: AC

## 2023-04-02 NOTE — Progress Notes (Signed)
   Subjective:    Patient ID: Erica Graves, female    DOB: 1966/02/18, 58 y.o.   MRN: [id number]  HPI low back right side pain When standing long periods, laying or sitting as well for a couple of months  Presents for complaints of low back pain specifically on the right side.  Describes a warm, tingling discomfort going from the right low back into the lateral thigh area stopping above the knee.  Rarely goes to the foot.  Worse with prolonged standing or sitting.  Also some difficulty at nighttime depending on her position.  No specific history of injury.  Note that patient has had bilateral total knee replacements.  Her last 1 was in July.  Has tried Tylenol  and Advil  with minimal relief.  Performs repetitive lifting as part of her job, sometimes lifting boxes up to 47 pounds.  No change in bowel or bladder habits.  No incontinence.   Review of Systems  Constitutional:  Positive for fatigue.  Respiratory:  Negative for cough, chest tightness and shortness of breath.   Cardiovascular:  Negative for chest pain.  Gastrointestinal:  Negative for abdominal pain.  Genitourinary:  Negative for enuresis.  Musculoskeletal:  Positive for back pain.  Neurological:  Negative for weakness.       Objective:   Physical Exam NAD.  Alert, oriented.  Lungs clear.  Heart regular rate rhythm.  No lumbar spinal tenderness to percussion.  Distinct tenderness noted in the right lumbar area radiating into the right buttock.  SLR negative on the left, positive on the right.  Patellar and Achilles reflexes intact.  Can get on and off exam table slowly but without difficulty.  Gait normal limit.  Active ROM of the back with tenderness noted.  Can perform heel and toe walking. Today's Vitals   04/02/23 1506  BP: 123/78  Pulse: 71  Temp: 97.6 F (36.4 C)  SpO2: 98%  Weight: 206 lb (93.4 kg)   Body mass index is 34.28 kg/m.        Assessment & Plan:  Acute right-sided low back pain with right-sided  sciatica - Plan: DG Lumbar Spine 2-3 Views Meds ordered this encounter  Medications   methocarbamol  (ROBAXIN ) 500 MG tablet    Sig: Take 1 tablet (500 mg total) by mouth 3 (three) times daily. Prn muscle spasms    Dispense:  30 tablet    Refill:  0    Supervising Provider:   ALPHONSA HAMILTON A [[id number]]   naproxen  (NAPROSYN ) 500 MG tablet    Sig: Take 1 tablet (500 mg total) by mouth 2 (two) times daily.    Dispense:  30 tablet    Refill:  0    Supervising Provider:   ALPHONSA HAMILTON A [[id number]]   Restart naproxen  and Robaxin  as directed for back pain. Ice/heat applications. OTC topicals as directed.  Restart lidocaine  patches as directed. Given information on low back exercises. X-ray of the low back due to history of degenerative changes in her knees. Warning signs reviewed.  Call back if symptoms worsen or persist.

## 2023-04-02 NOTE — Patient Instructions (Addendum)
 Ice/heat Lidocaine  patches  Low Back Sprain or Strain Rehab Ask your health care provider which exercises are safe for you. Do exercises exactly as told by your health care provider and adjust them as directed. It is normal to feel mild stretching, pulling, tightness, or discomfort as you do these exercises. Stop right away if you feel sudden pain or your pain gets worse. Do not begin these exercises until told by your health care provider. Stretching and range-of-motion exercises These exercises warm up your muscles and joints and improve the movement and flexibility of your back. These exercises also help to relieve pain, numbness, and tingling. Lumbar rotation  Lie on your back on a firm bed or the floor with your knees bent. Straighten your arms out to your sides so each arm forms a 90-degree angle (right angle) with a side of your body. Slowly move (rotate) both of your knees to one side of your body until you feel a stretch in your lower back (lumbar). Try not to let your shoulders lift off the floor. Hold this position for __________ seconds. Tense your abdominal muscles and slowly move your knees back to the starting position. Repeat this exercise on the other side of your body. Repeat __________ times. Complete this exercise __________ times a day. Single knee to chest  Lie on your back on a firm bed or the floor with both legs straight. Bend one of your knees. Use your hands to move your knee up toward your chest until you feel a gentle stretch in your lower back and buttock. Hold your leg in this position by holding on to the front of your knee. Keep your other leg as straight as possible. Hold this position for __________ seconds. Slowly return to the starting position. Repeat with your other leg. Repeat __________ times. Complete this exercise __________ times a day. Prone extension on elbows  Lie on your abdomen on a firm bed or the floor (prone position). Prop yourself up  on your elbows. Use your arms to help lift your chest up until you feel a gentle stretch in your abdomen and your lower back. This will place some of your body weight on your elbows. If this is uncomfortable, try stacking pillows under your chest. Your hips should stay down, against the surface that you are lying on. Keep your hip and back muscles relaxed. Hold this position for __________ seconds. Slowly relax your upper body and return to the starting position. Repeat __________ times. Complete this exercise __________ times a day. Strengthening exercises These exercises build strength and endurance in your back. Endurance is the ability to use your muscles for a long time, even after they get tired. Pelvic tilt This exercise strengthens the muscles that lie deep in the abdomen. Lie on your back on a firm bed or the floor with your legs extended. Bend your knees so they are pointing toward the ceiling and your feet are flat on the floor. Tighten your lower abdominal muscles to press your lower back against the floor. This motion will tilt your pelvis so your tailbone points up toward the ceiling instead of pointing to your feet or the floor. To help with this exercise, you may place a small towel under your lower back and try to push your back into the towel. Hold this position for __________ seconds. Let your muscles relax completely before you repeat this exercise. Repeat __________ times. Complete this exercise __________ times a day. Alternating arm and leg raises  Get on your hands and knees on a firm surface. If you are on a hard floor, you may want to use padding, such as an exercise mat, to cushion your knees. Line up your arms and legs. Your hands should be directly below your shoulders, and your knees should be directly below your hips. Lift your left leg behind you. At the same time, raise your right arm and straighten it in front of you. Do not lift your leg higher than your  hip. Do not lift your arm higher than your shoulder. Keep your abdominal and back muscles tight. Keep your hips facing the ground. Do not arch your back. Keep your balance carefully, and do not hold your breath. Hold this position for __________ seconds. Slowly return to the starting position. Repeat with your right leg and your left arm. Repeat __________ times. Complete this exercise __________ times a day. Abdominal set with straight leg raise  Lie on your back on a firm bed or the floor. Bend one of your knees and keep your other leg straight. Tense your abdominal muscles and lift your straight leg up, 4-6 inches (10-15 cm) off the ground. Keep your abdominal muscles tight and hold this position for __________ seconds. Do not hold your breath. Do not arch your back. Keep it flat against the ground. Keep your abdominal muscles tense as you slowly lower your leg back to the starting position. Repeat with your other leg. Repeat __________ times. Complete this exercise __________ times a day. Single leg lower with bent knees Lie on your back on a firm bed or the floor. Tense your abdominal muscles and lift your feet off the floor, one foot at a time, so your knees and hips are bent in 90-degree angles (right angles). Your knees should be over your hips and your lower legs should be parallel to the floor. Keeping your abdominal muscles tense and your knee bent, slowly lower one of your legs so your toe touches the ground. Lift your leg back up to return to the starting position. Do not hold your breath. Do not let your back arch. Keep your back flat against the ground. Repeat with your other leg. Repeat __________ times. Complete this exercise __________ times a day. Posture and body mechanics Good posture and healthy body mechanics can help to relieve stress in your body's tissues and joints. Body mechanics refers to the movements and positions of your body while you do your daily  activities. Posture is part of body mechanics. Good posture means: Your spine is in its natural S-curve position (neutral). Your shoulders are pulled back slightly. Your head is not tipped forward (neutral). Follow these guidelines to improve your posture and body mechanics in your everyday activities. Standing  When standing, keep your spine neutral and your feet about hip-width apart. Keep a slight bend in your knees. Your ears, shoulders, and hips should line up. When you do a task in which you stand in one place for a long time, place one foot up on a stable object that is 2-4 inches (5-10 cm) high, such as a footstool. This helps keep your spine neutral. Sitting  When sitting, keep your spine neutral and keep your feet flat on the floor. Use a footrest, if necessary, and keep your thighs parallel to the floor. Avoid rounding your shoulders, and avoid tilting your head forward. When working at a desk or a computer, keep your desk at a height where your hands are slightly lower than  your elbows. Slide your chair under your desk so you are close enough to maintain good posture. When working at a computer, place your monitor at a height where you are looking straight ahead and you do not have to tilt your head forward or downward to look at the screen. Resting When lying down and resting, avoid positions that are most painful for you. If you have pain with activities such as sitting, bending, stooping, or squatting, lie in a position in which your body does not bend very much. For example, avoid curling up on your side with your arms and knees near your chest (fetal position). If you have pain with activities such as standing for a long time or reaching with your arms, lie with your spine in a neutral position and bend your knees slightly. Try the following positions: Lying on your side with a pillow between your knees. Lying on your back with a pillow under your knees. Lifting  When lifting  objects, keep your feet at least shoulder-width apart and tighten your abdominal muscles. Bend your knees and hips and keep your spine neutral. It is important to lift using the strength of your legs, not your back. Do not lock your knees straight out. Always ask for help to lift heavy or awkward objects. This information is not intended to replace advice given to you by your health care provider. Make sure you discuss any questions you have with your health care provider. Document Revised: 06/18/2022 Document Reviewed: 05/02/2020 [employer] Patient Education  2024 [employer]Lumbosacral Radiculopathy Lumbosacral radiculopathy is a condition that involves the spinal nerves and nerve roots in the low back and bottom of the spine. The condition develops when these nerves and nerve roots move out of place or become inflamed and cause symptoms. What are the causes? This condition may be caused by: Pressure from a disk that bulges out of place (herniated disk). A disk is a plate of soft cartilage that separates bones in the spine. Disk changes that occur with age (disk degeneration). A narrowing of the bones of the lower back (spinal stenosis). A tumor. An infection. An injury that places sudden pressure on the disks that cushion the bones of your lower spine. What increases the risk? You are more likely to develop this condition if: You are a female who is 57-10 years old. You are a female who is 30-48 years old. You use improper technique when lifting things. You are overweight or live a sedentary lifestyle. You smoke. Your work requires frequent lifting. You do repetitive activities that strain the spine. What are the signs or symptoms? Symptoms of this condition include: Pain that goes down from your back into your legs (sciatica), usually on one side of the body. This is the most common symptom. The pain may be worse when you sit, cough, or sneeze. Tingling and numbness in your  legs. Muscle weakness in your legs. Loss of bladder control or bowel control. How is this diagnosed? This condition may be diagnosed based on: Your symptoms and medical history. A physical exam. If the pain lasts, you may have tests, such as: MRI. X-ray. CT scan. A type of CT scan used to examine the spinal canal after injecting a dye into your spine (myelogram). A test to measure how electrical impulses move through a nerve (nerve conduction study). A test to measure the electrical activity in muscles (electromyogram). How is this treated? In many cases, treatment is not needed for this condition. With rest,  the condition usually gets better over time. If treatment is needed, it may include: Working with a physical therapist to improve strength and flexibility. Taking pain medicine. Applying heat or ice or both to the affected areas. Having chiropractic spinal manipulation. Using transcutaneous electrical nerve stimulation (TENS) therapy. Getting a steroid injection in the spine. Having surgery. This may be needed if other treatments do not help. Different types of surgery may be done depending on the cause of this condition. Follow these instructions at home: Activity Avoid bending and any other activities that make the problem worse. Maintain a proper position when standing or sitting. When standing, keep your upper back and neck straight with your shoulders pulled back. Avoid slouching. When sitting, keep your back straight and relax your shoulders. Do not round your shoulders or pull them backward. Do not sit or stand in one place for long periods of time. Take brief periods of rest throughout the day. This will reduce your pain. It is usually better to rest by lying down or standing, not sitting. Mix in mild activity or stretching between long periods of rest. This will help to prevent stiffness and pain. Get regular exercise. Ask your health care provider what activities are  safe for you. If you were shown how to do any exercises or stretches, do them as told by your health care provider. You may have to avoid lifting. Ask your health care provider how much you can safely lift. Always use proper lifting technique, which includes: Bending your knees. Keeping the load close to your body. Avoiding twisting. Managing pain If directed, put ice on the affected area. To do this: Put ice in a plastic bag. Place a towel between your skin and the bag. Leave the ice on for 20 minutes, 2-3 times a day. Remove the ice if your skin turns bright red. This is very important. If you cannot feel pain, heat, or cold, you have a greater risk of damage to the area. If directed, apply heat to the affected area as often as told by your health care provider. Use the heat source that your health care provider recommends, such as a moist heat pack or a heating pad. Place a towel between your skin and the heat source. Leave the heat on for 20-30 minutes. Remove the heat if your skin turns bright red. This is especially important if you are unable to feel pain, heat, or cold. You have a greater risk of getting burned. Take over-the-counter and prescription medicines only as told by your health care provider. General instructions Sleep on a firm mattress in a comfortable position. Try lying on your side with your knees slightly bent. If you lie on your back, put a pillow under your knees. Ask your health care provider if the medicine prescribed to you requires you to avoid driving or using machinery. If your health care provider prescribed a diet or exercise program, follow it as told. Keep all follow-up visits. This is important. Contact a health care provider if: Your pain does not get better over time, even when taking pain medicines. Get help right away if: You develop severe pain. Your pain suddenly gets worse. You develop increasing weakness in your legs. You lose the ability to  control your bladder or bowel. You have difficulty walking or balancing. You have a fever. Summary Lumbosacral radiculopathy is a condition that occurs when the spinal nerves and nerve roots in the lower part of the spine move out of place or  become inflamed and cause symptoms. Symptoms include pain, numbness, and tingling that go down from your back into your legs (sciatica), muscle weakness, and loss of bladder control or bowel control. If directed, apply ice or heat or both to the affected area as told by your health care provider. Follow instructions about activity, rest, and proper lifting technique. This information is not intended to replace advice given to you by your health care provider. Make sure you discuss any questions you have with your health care provider. Document Revised: 08/18/2020 Document Reviewed: 08/18/2020 Elsevier Patient Education  2024 [employer].

## 2023-04-04 ENCOUNTER — Encounter: Payer: Self-pay | Admitting: Nurse Practitioner

## 2023-06-18 ENCOUNTER — Encounter: Payer: Self-pay | Admitting: *Deleted

## 2023-07-26 ENCOUNTER — Telehealth: Payer: Self-pay | Admitting: Orthopedic Surgery

## 2023-07-26 MED ORDER — CEPHALEXIN 500 MG PO CAPS: 2000.0000 mg | ORAL_CAPSULE | Freq: Once | ORAL | 2 refills | Status: AC

## 2023-07-26 NOTE — Telephone Encounter (Signed)
 Sent in per Dental protocol

## 2023-07-26 NOTE — Telephone Encounter (Signed)
 Dr. Delfino Fellers pt - spoke w/the pt, she has a dentist appointment on Monday and needs the antibiotic that he normally sends in.  She uses [facility] on [address].  [phone]

## 2023-09-05 ENCOUNTER — Encounter: Payer: Self-pay | Admitting: Orthopedic Surgery

## 2023-09-05 ENCOUNTER — Ambulatory Visit: Payer: BC Managed Care – PPO | Admitting: Orthopedic Surgery

## 2023-09-05 ENCOUNTER — Other Ambulatory Visit: Payer: Self-pay

## 2023-09-05 ENCOUNTER — Other Ambulatory Visit (INDEPENDENT_AMBULATORY_CARE_PROVIDER_SITE_OTHER): Payer: Self-pay

## 2023-09-05 DIAGNOSIS — M1712 Unilateral primary osteoarthritis, left knee: Secondary | ICD-10-CM

## 2023-09-05 DIAGNOSIS — Z96651 Presence of right artificial knee joint: Secondary | ICD-10-CM

## 2023-09-05 DIAGNOSIS — M5431 Sciatica, right side: Secondary | ICD-10-CM

## 2023-09-05 DIAGNOSIS — Z96652 Presence of left artificial knee joint: Secondary | ICD-10-CM

## 2023-09-05 DIAGNOSIS — M1711 Unilateral primary osteoarthritis, right knee: Secondary | ICD-10-CM

## 2023-09-05 MED ORDER — PREDNISONE 10 MG (48) PO TBPK: ORAL_TABLET | Freq: Every day | ORAL | 0 refills | Status: DC

## 2023-09-05 NOTE — Progress Notes (Signed)
   There were no vitals taken for this visit.  There is no height or weight on file to calculate BMI.  Chief Complaint  Patient presents with   Post-op Follow-up    Both knees     Encounter Diagnoses  Name Primary?   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

## 2023-09-05 NOTE — Progress Notes (Signed)
  Chief Complaint  Patient presents with   Post-op Follow-up    Both knees     Encounter Diagnoses  Name Primary?   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 10 mg 12 days as dir    Dispense:  48 tablet    Refill:  0

## 2023-12-16 ENCOUNTER — Ambulatory Visit
Admission: EM | Admit: 2023-12-16 | Discharge: 2023-12-16 | Disposition: A | Discharge status: Home / Self Care | Attending: Nurse Practitioner | Admitting: Nurse Practitioner

## 2023-12-16 DIAGNOSIS — M5441 Lumbago with sciatica, right side: Secondary | ICD-10-CM

## 2023-12-16 DIAGNOSIS — Z8669 Personal history of other diseases of the nervous system and sense organs: Secondary | ICD-10-CM

## 2023-12-16 MED ORDER — TIZANIDINE HCL 4 MG PO TABS: 4.0000 mg | ORAL_TABLET | Freq: Three times a day (TID) | ORAL | 0 refills | Status: AC | PRN

## 2023-12-16 MED ORDER — PREDNISONE 20 MG PO TABS: 40.0000 mg | ORAL_TABLET | Freq: Every day | ORAL | 0 refills | Status: AC

## 2023-12-16 MED ORDER — KETOROLAC TROMETHAMINE 30 MG/ML IJ SOLN
30.0000 mg | Freq: Once | INTRAMUSCULAR | Status: AC
  Administered 2023-12-16: 30 mg via INTRAMUSCULAR

## 2023-12-16 MED ORDER — DEXAMETHASONE SOD PHOSPHATE PF 10 MG/ML IJ SOLN
10.0000 mg | Freq: Once | INTRAMUSCULAR | Status: AC
  Administered 2023-12-16: 10 mg via INTRAMUSCULAR

## 2023-12-16 NOTE — ED Provider Notes (Signed)
 [facility]    CSN: [id number] Arrival date & time: 12/16/23  1528      History   Chief Complaint Chief Complaint  Patient presents with   Back Pain    HPI Erica Graves is a 58 y.o. female.   The history is provided by the patient.   Patient presents with a 2-day history of right sided low back pain.  States that the pain radiates down into her right leg.  States that she has also been experiencing some numbness.  She denies injury, trauma, loss of bowel or bladder function, saddle anesthesia, the inability to ambulate, bruising, swelling, or redness.  States she has been taking Tylenol  for her symptoms with minimal relief.  Patient reports prior history of sciatica.  Past Medical History:  Diagnosis Date   Carpal tunnel syndrome, bilateral    Chronic low back pain    Constipation    Counseling for estrogen replacement therapy 07/07/2014   Depression with anxiety    Dyspareunia 07/07/2014   GAD (generalized anxiety disorder) 04/07/2020   History of kidney stones    Hot flashes 07/07/2014   Hypertension    Menopause 07/07/2014   Moody 07/07/2014   Obesity    Umbilical hernia     Patient Active Problem List   Diagnosis Date Noted   Adhesive capsulitis of right knee 10/23/2022   S/P total knee replacement, right 09/11/22 09/25/2022   Primary localized osteoarthritis of right knee 09/11/2022   Caregiver stress 07/06/2022   Abdominal pain, epigastric 12/11/2021   Chronic idiopathic constipation 11/10/2021   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   Arthritis of carpometacarpal (CMC) joint of right thumb 10/07/2020   Radial styloid tenosynovitis (de quervain) 10/07/2020   Irritable mood 04/07/2020   GAD (generalized anxiety disorder) 04/07/2020   Vaginal itching 06/05/2019   Screening for colorectal cancer 06/05/2019   Screening for cholesterol level 06/05/2019   S/P trigger finger release 01/24/17 02/06/2017   Trigger finger of right thumb    Essential  hypertension, benign 11/18/2015   Gastroesophageal reflux disease without esophagitis 05/06/2015   Hot flashes 07/07/2014   Dyspareunia 07/07/2014   Moody 07/07/2014   Menopause 07/07/2014   Counseling for estrogen replacement therapy 07/07/2014   Morbid obesity (HCC) 03/05/2014   Carpal tunnel syndrome of right wrist 12/22/2013   Vitamin D  deficiency 10/14/2013   Migraine headache without aura 11/24/2012   Muscle contraction headache 11/24/2012   CTS (carpal tunnel syndrome) 08/14/2012   Stiffness of joint, not elsewhere classified, lower leg 04/22/2012   Weakness of left leg 04/22/2012   Difficulty walking 04/22/2012   S/P total knee replacement, left 03/31/12 04/03/2012   Arthritis of knee, degenerative 03/05/2012   Osteoarthritis of left knee 12/12/2011   S/P arthroscopy of left knee 06/13/2011   Lateral meniscus tear 06/13/2011   Stiffness of knee joint 06/05/2011   Medial meniscus, posterior horn derangement 05/16/2011   DEGENERATIVE JOINT DISEASE, KNEES, BILATERAL 12/19/2009   PATELLO-FEMORAL SYNDROME 12/19/2009   FOOT PAIN, LEFT 07/22/2007   DEPRESSION/ANXIETY 12/27/2006   SHOULDER PAIN, LEFT 12/27/2006   Overweight 11/12/2006   Constipation 11/12/2006   Lumbago 11/12/2006    Past Surgical History:  Procedure Laterality Date   ABDOMINAL HYSTERECTOMY     BACK SURGERY     removal of cyst subdermal   CARPAL TUNNEL RELEASE Left 09/30/2014   Procedure: CARPAL TUNNEL RELEASE;  Surgeon: Taft FORBES Minerva, MD;  Location: [facility];  Service: Orthopedics;  Laterality: Left;   CARPAL TUNNEL RELEASE  Right 09/28/2015   Procedure: CARPAL TUNNEL RELEASE;  Surgeon: Taft FORBES Minerva, MD;  Location: [facility];  Service: Orthopedics;  Laterality: Right;   COLONOSCOPY N/A 06/29/2013   Procedure: COLONOSCOPY;  Surgeon: Margo LITTIE Haddock, MD;  Location: [facility];  Service: Endoscopy;  Laterality: N/A;  1:30   ESOPHAGOGASTRODUODENOSCOPY (EGD) WITH PROPOFOL  N/A 01/04/2022   Procedure:  ESOPHAGOGASTRODUODENOSCOPY (EGD) WITH PROPOFOL ;  Surgeon: Cindie Carlin POUR, DO;  Location: [facility];  Service: Endoscopy;  Laterality: N/A;  12:30pm, asa 2   KNEE ARTHROSCOPY Right    KNEE ARTHROSCOPY  05/21/2011   Procedure: ARTHROSCOPY KNEE;  Surgeon: Taft FORBES Minerva, MD;  Location: [facility];  Service: Orthopedics;  Laterality: Left;  lateral menisectomy   KNEE CLOSED REDUCTION Left 05/26/2012   Procedure: CLOSED MANIPULATION KNEE;  Surgeon: Taft FORBES Minerva, MD;  Location: [facility];  Service: Orthopedics;  Laterality: Left;   KNEE CLOSED REDUCTION Right 10/23/2022   Procedure: CLOSED MANIPULATION KNEE;  Surgeon: Minerva Taft FORBES, MD;  Location: [facility];  Service: Orthopedics;  Laterality: Right;   left knee arthroscopy  08/14/2005 Dr. Minerva torn meniscus   LUMBAR SPINE SURGERY     PARTIAL HYSTERECTOMY     TOTAL KNEE ARTHROPLASTY  03/31/2012   Procedure: TOTAL KNEE ARTHROPLASTY;  Surgeon: Taft FORBES Minerva, MD;  Location: [facility];  Service: Orthopedics;  Laterality: Left;  Left Total Knee Arthroplasty   TOTAL KNEE ARTHROPLASTY Right 09/11/2022   Procedure: TOTAL KNEE ARTHROPLASTY;  Surgeon: Minerva Taft FORBES, MD;  Location: [facility];  Service: Orthopedics;  Laterality: Right;   TRIGGER FINGER RELEASE Right 01/24/2017   Procedure: RELEASE TRIGGER FINGER/A-1 PULLEY right thumb;  Surgeon: Minerva Taft FORBES, MD;  Location: [facility];  Service: Orthopedics;  Laterality: Right;    OB History     Gravida  3   Para  3   Term      Preterm      AB      Living         SAB      IAB      Ectopic      Multiple      Live Births               Home Medications    Prior to Admission medications   Medication Sig Start Date End Date Taking? Authorizing Provider  amLODipine  (NORVASC ) 5 MG tablet Take 1 tablet (5 mg total) by mouth daily. 07/06/22  Yes Mauro Elveria BROCKS, NP  indapamide  (LOZOL ) 1.25 MG tablet Take 1.25 mg by mouth daily. 10/12/22  Yes [provider]   APPLE CIDER VINEGAR PO Take 1 capsule by mouth daily.    [provider]  cholecalciferol  (VITAMIN D3) 25 MCG (1000 UNIT) tablet Take 1,000 Units by mouth daily.    [provider]  methocarbamol  (ROBAXIN ) 500 MG tablet Take 1 tablet (500 mg total) by mouth 3 (three) times daily. Prn muscle spasms 04/02/23   Mauro Elveria BROCKS, NP  Multiple Vitamin (MULTIVITAMIN WITH MINERALS) TABS tablet Take 1 tablet by mouth daily.    [provider]  naproxen  (NAPROSYN ) 500 MG tablet Take 1 tablet (500 mg total) by mouth 2 (two) times daily. 04/02/23   Mauro Elveria BROCKS, NP  pantoprazole  (PROTONIX ) 40 MG tablet Take 40 mg by mouth daily. 07/28/22   [provider]  predniSONE  (STERAPRED UNI-PAK 48 TAB) 10 MG (48) TBPK tablet Take by mouth daily. 10 mg 12 days as dir 09/05/23  Margrette Taft BRAVO, MD  tamsulosin  (FLOMAX ) 0.4 MG CAPS capsule Take 1 capsule (0.4 mg total) by mouth daily after supper. 09/28/22   Leath-Warren, Etta PARAS, NP    Family History Family History  Problem Relation Age of Onset   Heart disease Mother    Hypertension Mother    Colon cancer Neg Hx     Social History Social History   Tobacco Use   Smoking status: Never   Smokeless tobacco: Never  Vaping Use   Vaping status: Never Used  Substance Use Topics   Alcohol use: Yes    Comment: occ   Drug use: No     Allergies   Norco [hydrocodone -acetaminophen ]   Review of Systems Review of Systems Per HPI  Physical Exam Triage Vital Signs ED Triage Vitals  Encounter Vitals Group     BP 12/16/23 1604 (!) 144/89     Girls Systolic BP Percentile --      Girls Diastolic BP Percentile --      Boys Systolic BP Percentile --      Boys Diastolic BP Percentile --      Pulse Rate 12/16/23 1604 64     Resp 12/16/23 1604 16     Temp 12/16/23 1604 98.2 F (36.8 C)     Temp Source 12/16/23 1604 Oral     SpO2 12/16/23 1604 93 %     Weight --      Height --      Head Circumference --      Peak  Flow --      Pain Score 12/16/23 1605 9     Pain Loc --      Pain Education --      Exclude from Growth Chart --    No data found.  Updated Vital Signs BP (!) 144/89 (BP Location: Right Arm)   Pulse 64   Temp 98.2 F (36.8 C) (Oral)   Resp 16   SpO2 93%   Visual Acuity Right Eye Distance:   Left Eye Distance:   Bilateral Distance:    Right Eye Near:   Left Eye Near:    Bilateral Near:     Physical Exam Vitals and nursing note reviewed.  Constitutional:      General: She is not in acute distress.    Appearance: Normal appearance.  HENT:     Head: Normocephalic.  Eyes:     Extraocular Movements: Extraocular movements intact.     Conjunctiva/sclera: Conjunctivae normal.     Pupils: Pupils are equal, round, and reactive to light.  Cardiovascular:     Rate and Rhythm: Regular rhythm.     Pulses: Normal pulses.     Heart sounds: Normal heart sounds.  Pulmonary:     Effort: Pulmonary effort is normal.     Breath sounds: Normal breath sounds.  Musculoskeletal:     Cervical back: Normal range of motion.     Lumbar back: No swelling, deformity or tenderness. Decreased range of motion. Positive right straight leg raise test.       Back:  Skin:    General: Skin is warm and dry.  Neurological:     General: No focal deficit present.     Mental Status: She is alert and oriented to person, place, and time.  Psychiatric:        Mood and Affect: Mood normal.        Behavior: Behavior normal.      UC Treatments / Results  Labs (  all labs ordered are listed, but only abnormal results are displayed) Labs Reviewed - No data to display  EKG   Radiology No results found.  Procedures Procedures (including critical care time)  Medications Ordered in UC Medications - No data to display  Initial Impression / Assessment and Plan / UC Course  I have reviewed the triage vital signs and the nursing notes.  Pertinent labs & imaging results that were available during my  care of the patient were reviewed by me and considered in my medical decision making (see chart for details).  Patient with 2-day history of right sided low back pain with radiation to the right lower extremity.  Patient with underlying history of sciatica.  She does have some numbness in the right lower extremity.  Symptoms are consistent with right sided low back pain with sciatica.  She does not exhibit any red flag symptoms on exam today.  Decadron  10 mg IM and Toradol  30 mg IM administered.  Reviewed patient's most recent lab work, creatinine is greater than 60 per review of chart from lab work over the past year.  She further denies history of kidney disease.  Will start patient on prednisone  40 mg for the next 5 days along with tizanidine  4 mg.  Supportive care recommendations were provided discussed with the patient to include continuing over-the-counter Tylenol , the use of ice or heat, and stretching exercises.  Patient was given strict ER follow-up precautions.  Patient was advised if symptoms fail to improve, recommend follow-up with orthopedics for further evaluation.  Patient was in agreement with this plan of care and verbalizes understanding.  All questions were answered.  Patient stable for discharge.  Final Clinical Impressions(s) / UC Diagnoses   Final diagnoses:  None   Discharge Instructions   None    ED Prescriptions   None    PDMP not reviewed this encounter.   Gilmer Etta PARAS, NP 12/16/23 1635

## 2023-12-16 NOTE — ED Triage Notes (Signed)
 Pt states she I shaving a pain in her lower back that shoots down her right leg x 2 days. Taking tylenol  with no relief of symptoms.

## 2023-12-16 NOTE — Discharge Instructions (Signed)
 You were given an injection of Toradol  30 mg and Decadron  10 mg.  Continue to take Tylenol  for breakthrough pain or discomfort. Take medication as prescribed. Try to remain as active as possible. Gentle range of motion and stretching exercises to help with back spasm and pain.  I provided exercises for you to perform at least 2-3 times daily while symptoms persist. May apply ice or heat as needed.  Ice is recommended for pain or swelling, heat for spasm or stiffness.  Apply for 20 minutes, remove for 1 hour, then repeat. Go to the emergency department immediately if you develop weakness in your legs or feet, inability to walk, loss of bowel or bladder function, difficulty urinating or passing a bowel movement, or other concerns. If your symptoms fail to improve with this treatment, recommend follow-up with your orthopedist or primary care physician for further evaluation. Follow-up as needed.

## 2023-12-17 ENCOUNTER — Emergency Department (HOSPITAL_COMMUNITY)

## 2023-12-17 ENCOUNTER — Other Ambulatory Visit: Payer: Self-pay

## 2023-12-17 ENCOUNTER — Encounter (HOSPITAL_COMMUNITY): Payer: Self-pay

## 2023-12-17 ENCOUNTER — Emergency Department (HOSPITAL_COMMUNITY)
Admission: EM | Admit: 2023-12-17 | Discharge: 2023-12-17 | Disposition: A | Discharge status: Home / Self Care | Attending: Emergency Medicine | Admitting: Emergency Medicine

## 2023-12-17 DIAGNOSIS — Z79899 Other long term (current) drug therapy: Secondary | ICD-10-CM | POA: Insufficient documentation

## 2023-12-17 DIAGNOSIS — M545 Low back pain, unspecified: Secondary | ICD-10-CM | POA: Diagnosis present

## 2023-12-17 DIAGNOSIS — M5441 Lumbago with sciatica, right side: Secondary | ICD-10-CM | POA: Insufficient documentation

## 2023-12-17 DIAGNOSIS — I10 Essential (primary) hypertension: Secondary | ICD-10-CM | POA: Insufficient documentation

## 2023-12-17 LAB — CBC WITH DIFFERENTIAL/PLATELET
Abs Immature Granulocytes: 0.02 K/uL (ref 0.00–0.07)
Basophils Absolute: 0 K/uL (ref 0.0–0.1)
Basophils Relative: 0 %
Eosinophils Absolute: 0 K/uL (ref 0.0–0.5)
Eosinophils Relative: 0 %
HCT: 41.2 % (ref 36.0–46.0)
Hemoglobin: 13.6 g/dL (ref 12.0–15.0)
Immature Granulocytes: 0 %
Lymphocytes Relative: 13 %
Lymphs Abs: 1 K/uL (ref 0.7–4.0)
MCH: 29.9 pg (ref 26.0–34.0)
MCHC: 33 g/dL (ref 30.0–36.0)
MCV: 90.5 fL (ref 80.0–100.0)
Monocytes Absolute: 0.1 K/uL (ref 0.1–1.0)
Monocytes Relative: 2 %
Neutro Abs: 6.4 K/uL (ref 1.7–7.7)
Neutrophils Relative %: 85 %
Platelets: 237 K/uL (ref 150–400)
RBC: 4.55 MIL/uL (ref 3.87–5.11)
RDW: 12.4 % (ref 11.5–15.5)
WBC: 7.5 K/uL (ref 4.0–10.5)
nRBC: 0 % (ref 0.0–0.2)

## 2023-12-17 LAB — BASIC METABOLIC PANEL WITH GFR
Anion gap: 9 (ref 5–15)
BUN: 19 mg/dL (ref 6–20)
CO2: 22 mmol/L (ref 22–32)
Calcium: 9.4 mg/dL (ref 8.9–10.3)
Chloride: 108 mmol/L (ref 98–111)
Creatinine, Ser: 0.65 mg/dL (ref 0.44–1.00)
GFR, Estimated: >60 mL/min
Glucose, Bld: 134 mg/dL — ABNORMAL HIGH (ref 70–99)
Potassium: 4.9 mmol/L (ref 3.5–5.1)
Sodium: 139 mmol/L (ref 135–145)

## 2023-12-17 LAB — MAGNESIUM: Magnesium: 2.4 mg/dL (ref 1.7–2.4)

## 2023-12-17 MED ORDER — KETOROLAC TROMETHAMINE 60 MG/2ML IM SOLN
30.0000 mg | Freq: Once | INTRAMUSCULAR | Status: DC
  Filled 2023-12-17: qty 2

## 2023-12-17 MED ORDER — OXYCODONE-ACETAMINOPHEN 5-325 MG PO TABS
1.0000 | ORAL_TABLET | Freq: Once | ORAL | Status: DC
  Filled 2023-12-17: qty 1

## 2023-12-17 MED ORDER — LIDOCAINE 5 % EX PTCH
1.0000 | MEDICATED_PATCH | CUTANEOUS | Status: DC
  Administered 2023-12-17: 1 via TRANSDERMAL
  Filled 2023-12-17: qty 1

## 2023-12-17 MED ORDER — METHOCARBAMOL 500 MG PO TABS
1000.0000 mg | ORAL_TABLET | Freq: Once | ORAL | Status: AC
  Administered 2023-12-17: 1000 mg via ORAL
  Filled 2023-12-17: qty 2

## 2023-12-17 MED ORDER — OXYCODONE-ACETAMINOPHEN 5-325 MG PO TABS: ORAL_TABLET | ORAL | 0 refills | Status: AC

## 2023-12-17 MED ORDER — ONDANSETRON 4 MG PO TBDP: ORAL_TABLET | ORAL | 0 refills | Status: AC

## 2023-12-17 NOTE — ED Provider Notes (Signed)
 [place] EMERGENCY DEPARTMENT AT [facility] Provider Note   CSN: [id number] Arrival date & time: 12/17/23  9392     Patient presents with: Back Pain   Erica Graves is a 58 y.o. female.   HPI Patient presents for low back pain.  Medical history includes anxiety, depression, arthritis, migraines, GERD, HTN.  She was seen in urgent care yesterday for 2 days of right-sided low back pain with radiation down her right leg.  She was given intramuscular Decadron  and Toradol .  She was started on 5 days of prednisone  and prescribed tizanidine .  She states that her symptoms did improve after her urgent care visit.  This morning, symptoms worsened when she got up out of bed.  She has not yet taken anything for analgesia today.  She has numbness in her right leg described as a proximal anterolateral distribution.  She has felt weakness in her right leg.    Prior to Admission medications   Medication Sig Start Date End Date Taking? Authorizing Provider  amLODipine  (NORVASC ) 5 MG tablet Take 1 tablet (5 mg total) by mouth daily. 07/06/22   Mauro Elveria BROCKS, NP  APPLE CIDER VINEGAR PO Take 1 capsule by mouth daily.    [provider]  cholecalciferol  (VITAMIN D3) 25 MCG (1000 UNIT) tablet Take 1,000 Units by mouth daily.    [provider]  indapamide  (LOZOL ) 1.25 MG tablet Take 1.25 mg by mouth daily. 10/12/22   [provider]  methocarbamol  (ROBAXIN ) 500 MG tablet Take 1 tablet (500 mg total) by mouth 3 (three) times daily. Prn muscle spasms 04/02/23   Mauro Elveria BROCKS, NP  Multiple Vitamin (MULTIVITAMIN WITH MINERALS) TABS tablet Take 1 tablet by mouth daily.    [provider]  naproxen  (NAPROSYN ) 500 MG tablet Take 1 tablet (500 mg total) by mouth 2 (two) times daily. 04/02/23   Mauro Elveria BROCKS, NP  pantoprazole  (PROTONIX ) 40 MG tablet Take 40 mg by mouth daily. 07/28/22   [provider]  predniSONE  (DELTASONE ) 20 MG tablet Take 2  tablets (40 mg total) by mouth daily with breakfast for 5 days. 12/16/23 12/21/23  Leath-Warren, Etta PARAS, NP  tamsulosin  (FLOMAX ) 0.4 MG CAPS capsule Take 1 capsule (0.4 mg total) by mouth daily after supper. 09/28/22   Leath-Warren, Etta PARAS, NP  tiZANidine  (ZANAFLEX ) 4 MG tablet Take 1 tablet (4 mg total) by mouth every 8 (eight) hours as needed. 12/16/23   Leath-Warren, Etta PARAS, NP    Allergies: Norco [hydrocodone -acetaminophen ]    Review of Systems  Musculoskeletal:  Positive for back pain.  Neurological:  Positive for weakness and numbness.  All other systems reviewed and are negative.   Updated Vital Signs BP (!) 167/85   Pulse 74   Temp 98.3 F (36.8 C)   Resp 18   SpO2 97%   Physical Exam Vitals and nursing note reviewed.  Constitutional:      General: She is not in acute distress.    Appearance: Normal appearance. She is well-developed. She is not ill-appearing, toxic-appearing or diaphoretic.  HENT:     Head: Normocephalic and atraumatic.     Right Ear: External ear normal.     Left Ear: External ear normal.     Nose: Nose normal.     Mouth/Throat:     Mouth: Mucous membranes are moist.  Eyes:     Extraocular Movements: Extraocular movements intact.     Conjunctiva/sclera: Conjunctivae normal.  Cardiovascular:  Rate and Rhythm: Normal rate and regular rhythm.  Pulmonary:     Effort: Pulmonary effort is normal. No respiratory distress.  Abdominal:     General: There is no distension.     Palpations: Abdomen is soft.     Tenderness: There is no abdominal tenderness.  Musculoskeletal:        General: No swelling or deformity.     Cervical back: Normal range of motion and neck supple.  Skin:    General: Skin is warm and dry.     Coloration: Skin is not jaundiced or pale.  Neurological:     Mental Status: She is alert.     Sensory: Sensory deficit present.     Motor: Weakness present.  Psychiatric:        Mood and Affect: Mood normal.         Behavior: Behavior normal.     (all labs ordered are listed, but only abnormal results are displayed) Labs Reviewed  CBC WITH DIFFERENTIAL/PLATELET  BASIC METABOLIC PANEL WITH GFR  MAGNESIUM     EKG: None  Radiology: No results found.   Procedures   Medications Ordered in the ED  oxyCODONE -acetaminophen  (PERCOCET/ROXICET) 5-325 MG per tablet 1 tablet (has no administration in time range)  methocarbamol  (ROBAXIN ) tablet 1,000 mg (has no administration in time range)  lidocaine  (LIDODERM ) 5 % 1 patch (has no administration in time range)  ketorolac  (TORADOL ) injection 30 mg (has no administration in time range)                                    Medical Decision Making Amount and/or Complexity of Data Reviewed Labs: ordered. Radiology: ordered.  Risk Prescription drug management.  Patient presenting for right-sided low back pain.  Onset of symptoms was 3 days ago.  She was seen urgent care yesterday and prescribed prednisone  and tizanidine .  She has not yet taken anything for pain today.  When she woke up and got out of bed, she had worsened symptoms.  On arrival in the ED, vital signs notable for hypertension.  On exam, she is overall well-appearing.  She describes a numbness in the anterolateral distribution of her proximal right leg.  She also endorses diminished sensation on medial right thigh on exam.  She does seem to have some mild weakness on the right leg, when compared to the left.  Given concern of possible cord or cauda equina compression, will obtain MRI.  Multimodal pain control was ordered.  MRI was pending at time of signout.  Care of patient was signed out to oncoming ED provider.    Final diagnoses:  Acute right-sided low back pain with right-sided sciatica    ED Discharge Orders     None          Melvenia Motto, MD 12/17/23 [id number]

## 2023-12-17 NOTE — Discharge Instructions (Signed)
 Follow-up with Dr. Lanis or one of his associates in the next 2 to 4 weeks for your back problems

## 2023-12-17 NOTE — ED Notes (Signed)
 Pt ambulatory from triage back to room

## 2023-12-17 NOTE — ED Triage Notes (Addendum)
 Pt c/o lower back pain that goes down right leg. Back pain x2 weeks. Denies injury. Seen at [facility] yesterday. Ambulatory to room.

## 2024-03-30 ENCOUNTER — Ambulatory Visit: Admitting: Nurse Practitioner

## 2024-04-09 ENCOUNTER — Ambulatory Visit: Admitting: Nurse Practitioner

## 2024-09-10 ENCOUNTER — Ambulatory Visit: Admitting: Orthopedic Surgery
